# Patient Record
Sex: Female | Born: 1945
Health system: Southern US, Community
[De-identification: ages and names within clinical notes are randomized; demographics above are authoritative.]

## PROBLEM LIST (undated history)

## (undated) DIAGNOSIS — R748 Abnormal levels of other serum enzymes: Secondary | ICD-10-CM

## (undated) DIAGNOSIS — M1990 Unspecified osteoarthritis, unspecified site: Secondary | ICD-10-CM

## (undated) DIAGNOSIS — K219 Gastro-esophageal reflux disease without esophagitis: Secondary | ICD-10-CM

## (undated) DIAGNOSIS — C4490 Unspecified malignant neoplasm of skin, unspecified: Secondary | ICD-10-CM

## (undated) DIAGNOSIS — E785 Hyperlipidemia, unspecified: Secondary | ICD-10-CM

## (undated) DIAGNOSIS — G629 Polyneuropathy, unspecified: Secondary | ICD-10-CM

## (undated) DIAGNOSIS — Z973 Presence of spectacles and contact lenses: Secondary | ICD-10-CM

## (undated) DIAGNOSIS — I739 Peripheral vascular disease, unspecified: Secondary | ICD-10-CM

## (undated) DIAGNOSIS — J3089 Other allergic rhinitis: Secondary | ICD-10-CM

## (undated) DIAGNOSIS — R0683 Snoring: Secondary | ICD-10-CM

## (undated) DIAGNOSIS — G4733 Obstructive sleep apnea (adult) (pediatric): Secondary | ICD-10-CM

## (undated) DIAGNOSIS — C50919 Malignant neoplasm of unspecified site of unspecified female breast: Secondary | ICD-10-CM

## (undated) DIAGNOSIS — I10 Essential (primary) hypertension: Secondary | ICD-10-CM

## (undated) DIAGNOSIS — G43909 Migraine, unspecified, not intractable, without status migrainosus: Secondary | ICD-10-CM

## (undated) DIAGNOSIS — I779 Disorder of arteries and arterioles, unspecified: Secondary | ICD-10-CM

## (undated) DIAGNOSIS — J321 Chronic frontal sinusitis: Secondary | ICD-10-CM

## (undated) DIAGNOSIS — R7303 Prediabetes: Secondary | ICD-10-CM

## (undated) DIAGNOSIS — D649 Anemia, unspecified: Secondary | ICD-10-CM

## (undated) HISTORY — DX: Polyneuropathy, unspecified: G62.9

## (undated) HISTORY — DX: Malignant neoplasm of unspecified site of unspecified female breast: C50.919

## (undated) HISTORY — DX: Disorder of arteries and arterioles, unspecified: I77.9

## (undated) HISTORY — PX: NASAL SINUS SURGERY: SHX719

## (undated) HISTORY — DX: Essential (primary) hypertension: I10

## (undated) HISTORY — PX: WISDOM TOOTH EXTRACTION: SHX21

## (undated) HISTORY — PX: KNEE ARTHROSCOPY: SUR90

## (undated) HISTORY — PX: TOE SURGERY: SHX1073

## (undated) HISTORY — DX: Hyperlipidemia, unspecified: E78.5

## (undated) HISTORY — DX: Migraine, unspecified, not intractable, without status migrainosus: G43.909

## (undated) HISTORY — PX: COLONOSCOPY: SHX174

## (undated) HISTORY — DX: Gastro-esophageal reflux disease without esophagitis: K21.9

## (undated) HISTORY — DX: Obstructive sleep apnea (adult) (pediatric): G47.33

## (undated) HISTORY — DX: Unspecified osteoarthritis, unspecified site: M19.90

## (undated) HISTORY — DX: Peripheral vascular disease, unspecified: I73.9

---

## 1968-11-10 HISTORY — PX: ABDOMINAL HYSTERECTOMY: SHX81

## 1998-05-18 ENCOUNTER — Ambulatory Visit (HOSPITAL_COMMUNITY): Admission: RE | Admit: 1998-05-18 | Discharge: 1998-05-18 | Payer: Self-pay | Admitting: *Deleted

## 1998-08-15 ENCOUNTER — Ambulatory Visit (HOSPITAL_COMMUNITY): Admission: RE | Admit: 1998-08-15 | Discharge: 1998-08-15 | Payer: Self-pay | Admitting: Neurology

## 1998-09-07 ENCOUNTER — Encounter: Payer: Self-pay | Admitting: Gastroenterology

## 1998-09-07 ENCOUNTER — Ambulatory Visit (HOSPITAL_COMMUNITY): Admission: RE | Admit: 1998-09-07 | Discharge: 1998-09-07 | Payer: Self-pay | Admitting: Gastroenterology

## 1999-11-11 HISTORY — PX: JOINT REPLACEMENT: SHX530

## 1999-11-11 HISTORY — PX: BREAST BIOPSY: SHX20

## 2000-03-21 ENCOUNTER — Emergency Department (HOSPITAL_COMMUNITY): Admission: EM | Admit: 2000-03-21 | Discharge: 2000-03-21 | Payer: Self-pay | Admitting: Emergency Medicine

## 2000-08-19 ENCOUNTER — Encounter: Admission: RE | Admit: 2000-08-19 | Discharge: 2000-08-19 | Payer: Self-pay | Admitting: General Surgery

## 2000-08-19 ENCOUNTER — Encounter (INDEPENDENT_AMBULATORY_CARE_PROVIDER_SITE_OTHER): Payer: Self-pay | Admitting: Specialist

## 2000-08-19 ENCOUNTER — Encounter: Payer: Self-pay | Admitting: General Surgery

## 2000-08-19 ENCOUNTER — Other Ambulatory Visit: Admission: RE | Admit: 2000-08-19 | Discharge: 2000-08-19 | Payer: Self-pay | Admitting: General Surgery

## 2000-12-31 ENCOUNTER — Encounter: Payer: Self-pay | Admitting: Internal Medicine

## 2000-12-31 ENCOUNTER — Encounter: Admission: RE | Admit: 2000-12-31 | Discharge: 2000-12-31 | Payer: Self-pay | Admitting: Internal Medicine

## 2002-09-09 ENCOUNTER — Encounter: Payer: Self-pay | Admitting: Orthopaedic Surgery

## 2002-09-13 ENCOUNTER — Inpatient Hospital Stay (HOSPITAL_COMMUNITY): Admission: RE | Admit: 2002-09-13 | Discharge: 2002-09-20 | Payer: Self-pay | Admitting: Orthopaedic Surgery

## 2006-01-05 ENCOUNTER — Ambulatory Visit (HOSPITAL_COMMUNITY): Admission: RE | Admit: 2006-01-05 | Discharge: 2006-01-05 | Payer: Self-pay | Admitting: Internal Medicine

## 2007-01-15 ENCOUNTER — Ambulatory Visit (HOSPITAL_COMMUNITY): Admission: RE | Admit: 2007-01-15 | Discharge: 2007-01-15 | Payer: Self-pay | Admitting: Internal Medicine

## 2008-02-08 ENCOUNTER — Ambulatory Visit (HOSPITAL_COMMUNITY): Admission: RE | Admit: 2008-02-08 | Discharge: 2008-02-08 | Payer: Self-pay | Admitting: Internal Medicine

## 2009-02-13 ENCOUNTER — Ambulatory Visit (HOSPITAL_COMMUNITY): Admission: RE | Admit: 2009-02-13 | Discharge: 2009-02-13 | Payer: Self-pay | Admitting: Internal Medicine

## 2010-02-14 ENCOUNTER — Ambulatory Visit (HOSPITAL_COMMUNITY): Admission: RE | Admit: 2010-02-14 | Discharge: 2010-02-14 | Payer: Self-pay | Admitting: Internal Medicine

## 2010-12-01 ENCOUNTER — Encounter: Payer: Self-pay | Admitting: Internal Medicine

## 2011-01-13 ENCOUNTER — Other Ambulatory Visit (HOSPITAL_COMMUNITY): Payer: Self-pay | Admitting: Internal Medicine

## 2011-01-13 DIAGNOSIS — Z1231 Encounter for screening mammogram for malignant neoplasm of breast: Secondary | ICD-10-CM

## 2011-02-17 ENCOUNTER — Ambulatory Visit (HOSPITAL_COMMUNITY)
Admission: RE | Admit: 2011-02-17 | Discharge: 2011-02-17 | Disposition: A | Discharge status: Home / Self Care | Payer: Medicare Other | Source: Ambulatory Visit | Attending: Internal Medicine | Admitting: Internal Medicine

## 2011-02-17 DIAGNOSIS — Z1231 Encounter for screening mammogram for malignant neoplasm of breast: Secondary | ICD-10-CM | POA: Insufficient documentation

## 2011-03-28 NOTE — Discharge Summary (Signed)
   NAME:  Pamela Hendricks, Pamela Hendricks                        ACCOUNT NO.:  [id number]   MEDICAL RECORD NO.:  [id number]                   PATIENT TYPE:  INP   LOCATION:  5037                                 FACILITY:  [facility]   PHYSICIAN:  Lubertha Basque. Jerl Santos, M.D.             DATE OF BIRTH:  1946-10-01   DATE OF ADMISSION:  09/13/2002  DATE OF DISCHARGE:  09/20/2002                                 DISCHARGE SUMMARY   ADMISSION DIAGNOSES:  1. Left knee end-stage degenerative joint disease.  2. History of reflux.   DISCHARGE DIAGNOSES:  1. Left knee end-stage degenerative joint disease.  2. History of reflux.   OPERATION:  Total knee replacement, left side.   BRIEF HISTORY:  Pamela Hendricks is a 65 year old white female who is a patient  well known to use. We have done an arthroscopy in the past, and she has got  end-stage DJD in her left knee. We tried various oral anti-inflammatory  medications and injections of corticosteroids, and she was given some relief  but is only temporary. She is having pain every step, some night time pain,  and we discussed further different treatment options, that being total knee  replacement.   PERTINENT LABORATORY DATA:  Last INR was 2.5. Hemoglobin on last test was  10.6, hematocrit 31. X-ray findings:  Chest x-ray no active disease.   HOSPITAL COURSE:  The patient was admitted postoperatively and placed on a  variety p.o. and IM analgesics with pain addressed and drain pulled the  second day postoperatively. Wounds were benign. Physical therapy was ordered  to be touch down, and she could be weight bearing as tolerated, and she  progressed well with CPM therapy. Her Coumadin levels/INR was well above the  upper limit of our therapeutic range of 3 which delayed her discharge from  the hospital. Last test once was 2.5, and she was discharged home.   CONDITION ON DISCHARGE:  Improved.   FOLLOW UP:  She will return to our office in about one week's time. She is  given a prescription for Coumadin. This will be regulated by pharmacy.  Prescription for Percocet, #60, for pain. Arrangements for home therapy and  home protimes to be drawn, and we will see her back in our office in about a  week.     Lindwood Qua, P.A.                    Lubertha Basque Jerl Santos, M.D.    MC/MEDQ  D:  09/20/2002  T:  09/20/2002  Job:  [id number]

## 2011-03-28 NOTE — Op Note (Signed)
NAME:  Pamela Hendricks, DRISKILL                        ACCOUNT NO.:  [id number]   MEDICAL RECORD NO.:  [id number]                   PATIENT TYPE:  INP   LOCATION:  NA                                   FACILITY:  [facility]   PHYSICIAN:  Lubertha Basque. Jerl Santos, M.D.             DATE OF BIRTH:  [birth year]   DATE OF PROCEDURE:  09/13/2002  DATE OF DISCHARGE:                                 OPERATIVE REPORT   PREOPERATIVE DIAGNOSIS:  Left knee degenerative arthritis.   POSTOPERATIVE DIAGNOSIS:  Left knee degenerative arthritis.   PROCEDURE:  Left total knee replacement.   ANESTHESIA:  General and femoral nerve block.   ATTENDING SURGEON:  Lubertha Basque. Jerl Santos, M.D.   ASSISTANT:  Bradley Ferris, M.D. and Lindwood Qua, P.A.   INDICATIONS FOR PROCEDURE:  The patient is a 65 year-old woman with a long  history of left knee pain. This has persisted despite oral anti-  inflammatories and injections as well as an arthroscopy at which point some  end stage degenerative changes were found.  At this point she is offered a  knee replacement operation.  The procedure was discussed with the patient  and informed operative consent was obtained after a discussion of the  possible implications of, reaction to anesthesia, infection, DVT, PE and  death.   DESCRIPTION OF PROCEDURE:  The patient was taken to the operating suite  where a general anesthetic was acquired without difficulty.  She was  positioned supine and prepped and draped in normal sterile fashion.  After  the administration of IV antibiotics the leg was elevated, exsanguinated and  tourniquet inflated about the thigh.  An anterior longitudinal incision was  made with flexion down to the extensor mechanism.  Also, anti-infective  measures were used including the aforementioned IV antibiotic, Betadine  impregnated drape and closed third exhaust systems for each member of the  surgical team. The extensor mechanism was incised in a medial  parapatellar  fashion followed by a flip of the knee cap and flex of the knee.  She had  some end stage degenerative changes mostly in the lateral compartment. The  residual meniscal tissues, the ACL and the PCL were removed.  She had good  bone quality.  Next, the extramedullary guide was placed on the tibia to  create a tibial cut parallel with the floor.  An intramedullary guide was  then placed in the femur in order to create anterior and posterior cuts  making an extension gap of 10 mm.  A separate intramedullary guide was then  introduced in the femur to place a cutting block in order to make an equal  extension gap of 10 mm after a cut on the distal femur was made.  She  required a small amount of soft tissue balancing with lateral release off  the femur.  No lateral release for the patella was required and the patella  always tracked  very well.  The femur sized to a size standard component and  the appropriate chamfer cutting guide was placed and utilized.  The tibia  sized to a size 3 component and the appropriate cutting guide was placed and  used.  A trial reduction was done with those components and a 10 mm spacer.  She came from full extension to flexion at 120.  The knee cap tracked well.  The patella was cut down by about 10 mm in thickness followed by the use of  a 32 guide for placement of three drill holes for the all poly patella.  The  trial components were removed followed by pulsatile lavage of all cut bony  surfaces.  Cement was mixed including an antibiotic and was pressurized onto  all three cut bony surfaces. The [facility] components were then placed.  These components were a size 3 tibia, standard femur, size 32 all poly  patella and a 10 mm deep dish spacer.  Excess cement was trimmed and the  pressure was held in the components until the cement had hardened.  The  tourniquet was deflated.  A small amount of bleeding was easily controlled  with Bovie cautery. The  knee was thoroughly irrigated followed by placement  of a drain exiting superolaterally.  The extensor mechanism was  reapproximated with #1 Vicryl in an interrupted fashion followed by  subcutaneous reapproximation with 2-0 Vicryl and skin closure with staples.  The knee easily flexed to 110 after closure. Adaptic was placed on the wound  followed by dry gauze and a loose ace wrap.  Estimated blood loss and total  fluids can be obtained from the anesthesia records as can an accurate  tourniquet time.   DISPOSITION:  The patient was extubated in the operating room and taken to  the recovery room in stable condition.  The plans are for her to be admitted  to the orthopedic surgery service for appropriate postoperative care to  include perioperative antibiotics and Coumadin for DVT prophylaxis.                                               Lubertha Basque Jerl Santos, M.D.    PGD/MEDQ  D:  09/13/2002  T:  09/13/2002  Job:  [id number]

## 2011-11-11 HISTORY — PX: BREAST EXCISIONAL BIOPSY: SUR124

## 2012-01-16 ENCOUNTER — Other Ambulatory Visit (HOSPITAL_COMMUNITY): Payer: Self-pay | Admitting: Internal Medicine

## 2012-01-16 DIAGNOSIS — Z1231 Encounter for screening mammogram for malignant neoplasm of breast: Secondary | ICD-10-CM

## 2012-04-20 ENCOUNTER — Ambulatory Visit (HOSPITAL_COMMUNITY)
Admission: RE | Admit: 2012-04-20 | Discharge: 2012-04-20 | Disposition: A | Discharge status: Home / Self Care | Payer: Medicare Other | Source: Ambulatory Visit | Attending: Internal Medicine | Admitting: Internal Medicine

## 2012-04-20 DIAGNOSIS — Z1231 Encounter for screening mammogram for malignant neoplasm of breast: Secondary | ICD-10-CM | POA: Insufficient documentation

## 2012-05-03 ENCOUNTER — Other Ambulatory Visit: Payer: Self-pay | Admitting: Internal Medicine

## 2012-05-03 DIAGNOSIS — R928 Other abnormal and inconclusive findings on diagnostic imaging of breast: Secondary | ICD-10-CM

## 2012-05-10 ENCOUNTER — Other Ambulatory Visit: Payer: Self-pay | Admitting: Internal Medicine

## 2012-05-10 ENCOUNTER — Ambulatory Visit
Admission: RE | Admit: 2012-05-10 | Discharge: 2012-05-10 | Disposition: A | Discharge status: Home / Self Care | Payer: Medicare Other | Source: Ambulatory Visit | Attending: Internal Medicine | Admitting: Internal Medicine

## 2012-05-10 DIAGNOSIS — R928 Other abnormal and inconclusive findings on diagnostic imaging of breast: Secondary | ICD-10-CM

## 2012-06-10 ENCOUNTER — Encounter (INDEPENDENT_AMBULATORY_CARE_PROVIDER_SITE_OTHER): Payer: Self-pay | Admitting: General Surgery

## 2012-06-10 ENCOUNTER — Ambulatory Visit (INDEPENDENT_AMBULATORY_CARE_PROVIDER_SITE_OTHER): Payer: Medicare Other | Admitting: General Surgery

## 2012-06-10 ENCOUNTER — Other Ambulatory Visit (INDEPENDENT_AMBULATORY_CARE_PROVIDER_SITE_OTHER): Payer: Self-pay | Admitting: General Surgery

## 2012-06-10 VITALS — BP 134/72 | HR 66 | Temp 97.8°F | Resp 16 | Ht 65.0 in | Wt 201.8 lb

## 2012-06-10 DIAGNOSIS — R928 Other abnormal and inconclusive findings on diagnostic imaging of breast: Secondary | ICD-10-CM

## 2012-06-10 NOTE — Patient Instructions (Signed)
Your mammogram reveals an  abnormal, focal density in the upper outer quadrant of the right breast. Needle biopsy of this area reveals a complex sclerosing lesion. There is a low but definite possibility there could be early breast cancer in the right breast.  You will be scheduled for surgery on your right breast to excise this area in the near future.    Lumpectomy, Breast Conserving Surgery A lumpectomy is breast surgery that removes only part of the breast. Another name used may be partial mastectomy. The amount removed varies. Make sure you understand how much of your breast will be removed. Reasons for a lumpectomy:  Any solid breast mass.   Grouped significant nodularity that may be confused with a solitary breast mass.  Lumpectomy is the most common form of breast cancer surgery today. The surgeon removes the portion of your breast which contains the tumor (cancer). This is the lump. Some normal tissue around the lump is also removed to be sure that all the tumor has been removed.  If cancer cells are found in the margins where the breast tissue was removed, your surgeon will do more surgery to remove the remaining cancer tissue. This is called re-excision surgery. Radiation and/or chemotherapy treatments are often given following a lumpectomy to kill any cancer cells that could possibly remain.  REASONS YOU MAY NOT BE ABLE TO HAVE BREAST CONSERVING SURGERY:  The tumor is located in more than one place.   Your breast is small and the tumor is large so the breast would be disfigured.   The entire tumor removal is not successful with a lumpectomy.   You cannot commit to a full course of chemotherapy, radiation therapy or are pregnant and cannot have radiation.   You have previously had radiation to the breast to treat cancer.  HOW A LUMPECTOMY IS PERFORMED If overnight nursing is not required following a biopsy, a lumpectomy can be performed as a same-day surgery. This can be done in  a hospital, clinic, or surgical center. The anesthesia used will depend on your surgeon. They will discuss this with you. A general anesthetic keeps you sleeping through the procedure. LET YOUR CAREGIVERS KNOW ABOUT THE FOLLOWING:  Allergies   Medications taken including herbs, eye drops, over the counter medications, and creams.   Use of steroids (by mouth or creams)   Previous problems with anesthetics or Novocaine.   Possibility of pregnancy, if this applies   History of blood clots (thrombophlebitis)   History of bleeding or blood problems.   Previous surgery   Other health problems  BEFORE THE PROCEDURE You should be present one hour prior to your procedure unless directed otherwise.  AFTER THE PROCEDURE  After surgery, you will be taken to the recovery area where a nurse will watch and check your progress. Once you're awake, stable, and taking fluids well, barring other problems you will be allowed to go home.   Ice packs applied to your operative site may help with discomfort and keep the swelling down.   A small rubber drain may be placed in the breast for a couple of days to prevent a hematoma from developing in the breast.   A pressure dressing may be applied for 24 to 48 hours to prevent bleeding.   Keep the wound dry.   You may resume a normal diet and activities as directed. Avoid strenuous activities affecting the arm on the side of the biopsy site such as tennis, swimming, heavy lifting (more than  10 pounds) or pulling.   Bruising in the breast is normal following this procedure.   Wearing a bra - even to bed - may be more comfortable and also help keep the dressing on.   Change dressings as directed.   Only take over-the-counter or prescription medicines for pain, discomfort, or fever as directed by your caregiver.  Call for your results as instructed by your surgeon. Remember it is your responsibility to get the results of your lumpectomy if your surgeon  asked you to follow-up. Do not assume everything is fine if you have not heard from your caregiver. SEEK MEDICAL CARE IF:   There is increased bleeding (more than a small spot) from the wound.   You notice redness, swelling, or increasing pain in the wound.   Pus is coming from wound.   An unexplained oral temperature above 102 F (38.9 C) develops.   You notice a foul smell coming from the wound or dressing.  SEEK IMMEDIATE MEDICAL CARE IF:   You develop a rash.   You have difficulty breathing.   You have any allergic problems.  Document Released: 12/08/2006 Document Revised: 10/16/2011 Document Reviewed: 03/11/2007 [facility] Patient Information 2012 [place], [place].

## 2012-06-10 NOTE — Progress Notes (Signed)
Patient ID: Pamela Hendricks, female   DOB: 07-Aug-1946, 66 y.o.   MRN: [id number]  No chief complaint on file.   [name] is a 66 y.o. female.  She is referred by Dr. Lanora Manis needle for evaluation of an abnormal mammogram and biopsy of the right breast, revealing complex sclerosing lesion in the upper outer quadrant of the right breast. Her primary care physician is Dr. Theressa Millard.  The patient has not had any prior breast problems. She does not perceive a mass. Recent screening mammograms showed an area of architectural distortion at the 10:30 position of the right breast, 5 cm from the nipple. On July 1 she underwent image guided biopsy which shows complex sclerosing lesion and microcalcifications. She was referred for surgical consultation and consideration of diagnostic excisional biopsy.  Family history is negative for breast cancer or ovarian cancer.  The patient's past history is significant for hypertension, obesity, depressive disorder, hyperlipidemia, left TKR, vaginal hysterectomy and removal of one ovary. HPI  Past Medical History  Diagnosis Date  . Arthritis   . GERD (gastroesophageal reflux disease)   . Hyperlipidemia   . Hypertension   . Neuromuscular disorder     Past Surgical History  Procedure Date  . Joint replacement 2001    Left Knee Replacement  . Abdominal hysterectomy 1970    Total HYST    Family History  Problem Relation Age of Onset  . Cancer Mother   . Hypertension Mother   . Hypertension Father   . Heart disease Father   . Cancer Sister     Brain Ca  . Cancer Brother     Prostate Ca    Social History History  Substance Use Topics  . Smoking status: Never Smoker   . Smokeless tobacco: Not on file  . Alcohol Use: No    No Known Allergies    Review of Systems Review of Systems  Constitutional: Negative for fever, chills and unexpected weight change.  HENT: Negative for hearing loss, congestion, sore throat, trouble  swallowing and voice change.   Eyes: Negative for visual disturbance.  Respiratory: Negative for cough and wheezing.   Cardiovascular: Negative for chest pain, palpitations and leg swelling.  Gastrointestinal: Negative for nausea, vomiting, abdominal pain, diarrhea, constipation, blood in stool, abdominal distention and anal bleeding.  Genitourinary: Negative for hematuria, vaginal bleeding and difficulty urinating.  Musculoskeletal: Negative for arthralgias.  Skin: Negative for rash and wound.  Neurological: Negative for seizures, syncope and headaches.  Hematological: Negative for adenopathy. Does not bruise/bleed easily.  Psychiatric/Behavioral: Negative for confusion.    Blood pressure 134/72, pulse 66, temperature 97.8 F (36.6 C), temperature source Temporal, resp. rate 16, height 5\' 5"  (1.651 m), weight 201 lb 12.8 oz (91.536 kg).  Physical Exam Physical Exam  Constitutional: She is oriented to person, place, and time. She appears well-developed and well-nourished. No distress.  HENT:  Head: Normocephalic and atraumatic.  Nose: Nose normal.  Mouth/Throat: No oropharyngeal exudate.  Eyes: Conjunctivae and EOM are normal. Pupils are equal, round, and reactive to light. Left eye exhibits no discharge. No scleral icterus.  Neck: Neck supple. No JVD present. No tracheal deviation present. No thyromegaly present.  Cardiovascular: Normal rate, regular rhythm, normal heart sounds and intact distal pulses.   No murmur heard. Pulmonary/Chest: Effort normal and breath sounds normal. No respiratory distress. She has no wheezes. She has no rales. She exhibits no tenderness.       Breasts are moderately large. Healed biopsy  site at 9 oclock position right breast. Basically no mass, skin changes, or axillary adenopathy on either side.  Abdominal: Soft. Bowel sounds are normal. She exhibits no distension and no mass. There is no tenderness. There is no rebound and no guarding.  Musculoskeletal:  She exhibits no edema and no tenderness.  Lymphadenopathy:    She has no cervical adenopathy.  Neurological: She is alert and oriented to person, place, and time. She exhibits normal muscle tone. Coordination normal.  Skin: Skin is warm. No rash noted. She is not diaphoretic. No erythema. No pallor.  Psychiatric: She has a normal mood and affect. Her behavior is normal. Judgment and thought content normal.    Data Reviewed I reviewed her mammograms, ultrasound, pathology report and Dr. Earl Gala should recent history and physical.  Assessment    Complex sclerosing lesion, right breast, upper outer quadrant, focal finding. Excisional biopsy is warranted to exclude occult early breast cancer  Obesity  Hypertension  Major depressive disorder    Plan    The patient was offered excisional biopsy, and she wants to have that done.  She'll be scheduled for right breast biopsy and excision with margins with needle localization in the near future.  I discussed the indications, details, techniques, and numerous risks with the patient in detail. She understands these issues. Her questions were answered. She agrees with this plan.       Angelia Mould. Derrell Lolling, M.D., [facility] General and Minimally invasive Surgery Breast and Colorectal Surgery Office:   [phone] Pager:   [phone]   06/10/2012, 10:33 AM

## 2012-06-14 ENCOUNTER — Telehealth (INDEPENDENT_AMBULATORY_CARE_PROVIDER_SITE_OTHER): Payer: Self-pay

## 2012-06-14 NOTE — Telephone Encounter (Signed)
The patient called to see what medications to stop before surgery.  She states she is on a daily 81 mg Aspirin and an herbal medication.  I advised her to stop both of those a week before her surgery.  I told her she can stay on the Calcium and Multivitamin.

## 2012-06-14 NOTE — Telephone Encounter (Signed)
Patient called with questions regarding about medications she need's to stop prior to surgery.  Patient scheduled for Right Breast NL on 06/21/12.

## 2012-06-15 ENCOUNTER — Encounter (HOSPITAL_BASED_OUTPATIENT_CLINIC_OR_DEPARTMENT_OTHER): Payer: Self-pay | Admitting: *Deleted

## 2012-06-15 NOTE — Progress Notes (Signed)
To come in for bmet and ekg 

## 2012-06-18 ENCOUNTER — Other Ambulatory Visit: Payer: Self-pay

## 2012-06-18 ENCOUNTER — Encounter (HOSPITAL_BASED_OUTPATIENT_CLINIC_OR_DEPARTMENT_OTHER)
Admission: RE | Admit: 2012-06-18 | Discharge: 2012-06-18 | Disposition: A | Discharge status: Home / Self Care | Payer: Medicare Other | Source: Ambulatory Visit | Attending: General Surgery | Admitting: General Surgery

## 2012-06-18 LAB — BASIC METABOLIC PANEL
Calcium: 9.5 mg/dL (ref 8.4–10.5)
GFR calc Af Amer: >90 mL/min (ref >90)
GFR calc non Af Amer: >90 mL/min (ref >90)
Potassium: 4.9 mEq/L (ref 3.5–5.1)
Sodium: 138 mEq/L (ref 135–145)

## 2012-06-18 NOTE — H&P (Signed)
Pamela Hendricks    MRN: [id number]   Description: 66 year old female  Provider: [name], MD  Department: [facility]      Diagnoses     Abnormal mammogram   - Primary    793.80      Vitals    BP Pulse Temp Resp Ht Wt    134/72 66 97.8 F (36.6 C) (Temporal) 16 5\' 5"  (1.651 m) 201 lb 12.8 oz (91.536 kg)    BMI - 33.58 kg/m2                Hiatory and Physical     [name], MD   Patient ID: Pamela Hendricks, female   DOB: 1946-05-19, 66 y.o.   MRN: [id number]           [name] is a 66 y.o. female.  She is referred by Dr. Lanora Manis needle for evaluation of an abnormal mammogram and biopsy of the right breast, revealing complex sclerosing lesion in the upper outer quadrant of the right breast. Her primary care physician is Dr. Theressa Millard.   The patient has not had any prior breast problems. She does not perceive a mass. Recent screening mammograms showed an area of architectural distortion at the 10:30 position of the right breast, 5 cm from the nipple. On July 1 she underwent image guided biopsy which shows complex sclerosing lesion and microcalcifications. She was referred for surgical consultation and consideration of diagnostic excisional biopsy.   Family history is negative for breast cancer or ovarian cancer.   The patient's past history is significant for hypertension, obesity, depressive disorder, hyperlipidemia, left TKR, vaginal hysterectomy and removal of one ovary.     Past Medical History   Diagnosis  Date   .  Arthritis     .  GERD (gastroesophageal reflux disease)     .  Hyperlipidemia     .  Hypertension     .  Neuromuscular disorder         Past Surgical History   Procedure  Date   .  Joint replacement  2001       Left Knee Replacement   .  Abdominal hysterectomy  1970       Total HYST       Family History   Problem  Relation  Age of Onset   .  Cancer  Mother     .  Hypertension  Mother     .   Hypertension  Father     .  Heart disease  Father     .  Cancer  Sister         Brain Ca   .  Cancer  Brother         Prostate Ca      Social History History   Substance Use Topics   .  Smoking status:  Never Smoker    .  Smokeless tobacco:  Not on file   .  Alcohol Use:  No      No Known Allergies       ROS  Constitutional: Negative for fever, chills and unexpected weight change.  HENT: Negative for hearing loss, congestion, sore throat, trouble swallowing and voice change.   Eyes: Negative for visual disturbance.  Respiratory: Negative for cough and wheezing.   Cardiovascular: Negative for chest pain, palpitations and leg swelling.  Gastrointestinal: Negative for nausea, vomiting, abdominal pain, diarrhea, constipation, blood in stool, abdominal distention  and anal bleeding.  Genitourinary: Negative for hematuria, vaginal bleeding and difficulty urinating.  Musculoskeletal: Negative for arthralgias.  Skin: Negative for rash and wound.  Neurological: Negative for seizures, syncope and headaches.  Hematological: Negative for adenopathy. Does not bruise/bleed easily.  Psychiatric/Behavioral: Negative for confusion.    Constitutional: She is oriented to person, place, and time. She appears well-developed and well-nourished. No distress.  HENT:   Head: Normocephalic and atraumatic.   Nose: Nose normal.   Mouth/Throat: No oropharyngeal exudate.  Eyes: Conjunctivae and EOM are normal. Pupils are equal, round, and reactive to light. Left eye exhibits no discharge. No scleral icterus.  Neck: Neck supple. No JVD present. No tracheal deviation present. No thyromegaly present.  Cardiovascular: Normal rate, regular rhythm, normal heart sounds and intact distal pulses.    No murmur heard. Pulmonary/Chest: Effort normal and breath sounds normal. No respiratory distress. She has no wheezes. She has no rales. She exhibits no tenderness.       Breasts are moderately large. Healed  biopsy site at 9 oclock position right breast. Basically no mass, skin changes, or axillary adenopathy on either side.  Abdominal: Soft. Bowel sounds are normal. She exhibits no distension and no mass. There is no tenderness. There is no rebound and no guarding.  Musculoskeletal: She exhibits no edema and no tenderness.  Lymphadenopathy:    She has no cervical adenopathy.  Neurological: She is alert and oriented to person, place, and time. She exhibits normal muscle tone. Coordination normal.  Skin: Skin is warm. No rash noted. She is not diaphoretic. No erythema. No pallor.  Psychiatric: She has a normal mood and affect. Her behavior is normal. Judgment and thought content normal.    Data Reviewed I reviewed her mammograms, ultrasound, pathology report and Dr. Earl Gala should recent history and physical.   Assessment Complex sclerosing lesion, right breast, upper outer quadrant, focal finding. Excisional biopsy is warranted to exclude occult early breast cancer   Obesity   Hypertension   Major depressive disorder   Plan The patient was offered excisional biopsy, and she wants to have that done.   She'll be scheduled for right breast biopsy and excision with margins with needle localization in the near future.   I discussed the indications, details, techniques, and numerous risks with the patient in detail. She understands these issues. Her questions were answered. She agrees with this plan.       Angelia Pamela Hendricks. Derrell Lolling, M.D., [facility] General and Minimally invasive Surgery Breast and Colorectal Surgery Office:   [phone] Pager:   [phone]

## 2012-06-18 NOTE — Progress Notes (Signed)
ekg cleared by dr frederick 

## 2012-06-21 ENCOUNTER — Encounter (HOSPITAL_BASED_OUTPATIENT_CLINIC_OR_DEPARTMENT_OTHER): Payer: Self-pay | Admitting: Certified Registered Nurse Anesthetist

## 2012-06-21 ENCOUNTER — Ambulatory Visit
Admit: 2012-06-21 | Discharge: 2012-06-21 | Disposition: A | Discharge status: Home / Self Care | Payer: Medicare Other | Attending: General Surgery | Admitting: General Surgery

## 2012-06-21 ENCOUNTER — Ambulatory Visit (HOSPITAL_BASED_OUTPATIENT_CLINIC_OR_DEPARTMENT_OTHER)
Admission: RE | Admit: 2012-06-21 | Discharge: 2012-06-21 | Disposition: A | Discharge status: Home / Self Care | Payer: Medicare Other | Source: Ambulatory Visit | Attending: General Surgery | Admitting: General Surgery

## 2012-06-21 ENCOUNTER — Encounter (HOSPITAL_BASED_OUTPATIENT_CLINIC_OR_DEPARTMENT_OTHER)
Admission: RE | Disposition: A | Discharge status: Home / Self Care | Payer: Self-pay | Source: Ambulatory Visit | Attending: General Surgery

## 2012-06-21 ENCOUNTER — Ambulatory Visit (HOSPITAL_BASED_OUTPATIENT_CLINIC_OR_DEPARTMENT_OTHER): Payer: Medicare Other | Admitting: Certified Registered Nurse Anesthetist

## 2012-06-21 ENCOUNTER — Ambulatory Visit
Admission: RE | Admit: 2012-06-21 | Discharge: 2012-06-21 | Disposition: A | Discharge status: Home / Self Care | Payer: Medicare Other | Source: Ambulatory Visit | Attending: General Surgery | Admitting: General Surgery

## 2012-06-21 ENCOUNTER — Encounter (HOSPITAL_BASED_OUTPATIENT_CLINIC_OR_DEPARTMENT_OTHER): Payer: Self-pay | Admitting: *Deleted

## 2012-06-21 DIAGNOSIS — D249 Benign neoplasm of unspecified breast: Secondary | ICD-10-CM

## 2012-06-21 DIAGNOSIS — R928 Other abnormal and inconclusive findings on diagnostic imaging of breast: Secondary | ICD-10-CM | POA: Diagnosis present

## 2012-06-21 DIAGNOSIS — I10 Essential (primary) hypertension: Secondary | ICD-10-CM | POA: Insufficient documentation

## 2012-06-21 DIAGNOSIS — Z01812 Encounter for preprocedural laboratory examination: Secondary | ICD-10-CM | POA: Insufficient documentation

## 2012-06-21 DIAGNOSIS — Z01810 Encounter for preprocedural cardiovascular examination: Secondary | ICD-10-CM | POA: Insufficient documentation

## 2012-06-21 DIAGNOSIS — K219 Gastro-esophageal reflux disease without esophagitis: Secondary | ICD-10-CM | POA: Insufficient documentation

## 2012-06-21 DIAGNOSIS — N6029 Fibroadenosis of unspecified breast: Secondary | ICD-10-CM | POA: Insufficient documentation

## 2012-06-21 HISTORY — DX: Presence of spectacles and contact lenses: Z97.3

## 2012-06-21 HISTORY — DX: Snoring: R06.83

## 2012-06-21 SURGERY — BREAST LUMPECTOMY WITH NEEDLE LOCALIZATION
Anesthesia: General | Site: Breast | Laterality: Right | Wound class: Clean

## 2012-06-21 MED ORDER — DEXAMETHASONE SODIUM PHOSPHATE 4 MG/ML IJ SOLN
INTRAMUSCULAR | Status: DC | PRN
  Administered 2012-06-21: 10 mg via INTRAVENOUS

## 2012-06-21 MED ORDER — BUPIVACAINE-EPINEPHRINE 0.5% -1:200000 IJ SOLN
INTRAMUSCULAR | Status: DC | PRN
  Administered 2012-06-21: 17 mL

## 2012-06-21 MED ORDER — OXYCODONE HCL 5 MG/5ML PO SOLN: 5.0000 mg | Freq: Once | ORAL | Status: DC | PRN

## 2012-06-21 MED ORDER — SODIUM CHLORIDE 0.9 % IJ SOLN: 3.0000 mL | Freq: Two times a day (BID) | INTRAMUSCULAR | Status: DC

## 2012-06-21 MED ORDER — MORPHINE SULFATE 2 MG/ML IJ SOLN: 2.0000 mg | INTRAMUSCULAR | Status: DC | PRN

## 2012-06-21 MED ORDER — MIDAZOLAM HCL 5 MG/5ML IJ SOLN
INTRAMUSCULAR | Status: DC | PRN
  Administered 2012-06-21: 1 mg via INTRAVENOUS

## 2012-06-21 MED ORDER — LIDOCAINE HCL (CARDIAC) 20 MG/ML IV SOLN
INTRAVENOUS | Status: DC | PRN
  Administered 2012-06-21: 50 mg via INTRAVENOUS

## 2012-06-21 MED ORDER — SODIUM CHLORIDE 0.9 % IJ SOLN: 3.0000 mL | INTRAMUSCULAR | Status: DC | PRN

## 2012-06-21 MED ORDER — ONDANSETRON HCL 4 MG/2ML IJ SOLN
INTRAMUSCULAR | Status: DC | PRN
  Administered 2012-06-21: 4 mg via INTRAVENOUS

## 2012-06-21 MED ORDER — ACETAMINOPHEN 650 MG RE SUPP: 650.0000 mg | RECTAL | Status: DC | PRN

## 2012-06-21 MED ORDER — SCOPOLAMINE 1 MG/3DAYS TD PT72
1.0000 | MEDICATED_PATCH | Freq: Once | TRANSDERMAL | Status: DC
  Administered 2012-06-21: 1.5 mg via TRANSDERMAL

## 2012-06-21 MED ORDER — OXYCODONE HCL 5 MG PO TABS: 5.0000 mg | ORAL_TABLET | ORAL | Status: DC | PRN

## 2012-06-21 MED ORDER — HYDROMORPHONE HCL PF 1 MG/ML IJ SOLN: 0.2500 mg | INTRAMUSCULAR | Status: DC | PRN

## 2012-06-21 MED ORDER — ONDANSETRON HCL 4 MG/2ML IJ SOLN: 4.0000 mg | Freq: Four times a day (QID) | INTRAMUSCULAR | Status: DC | PRN

## 2012-06-21 MED ORDER — LACTATED RINGERS IV SOLN
INTRAVENOUS | Status: DC
  Administered 2012-06-21 (×2): via INTRAVENOUS

## 2012-06-21 MED ORDER — METOCLOPRAMIDE HCL 5 MG/ML IJ SOLN: 10.0000 mg | Freq: Once | INTRAMUSCULAR | Status: DC | PRN

## 2012-06-21 MED ORDER — CHLORHEXIDINE GLUCONATE 4 % EX LIQD: 1.0000 "application " | Freq: Once | CUTANEOUS | Status: DC

## 2012-06-21 MED ORDER — ACETAMINOPHEN 10 MG/ML IV SOLN
1000.0000 mg | Freq: Once | INTRAVENOUS | Status: AC
  Administered 2012-06-21: 1000 mg via INTRAVENOUS

## 2012-06-21 MED ORDER — OXYCODONE HCL 5 MG PO TABS: 5.0000 mg | ORAL_TABLET | Freq: Once | ORAL | Status: DC | PRN

## 2012-06-21 MED ORDER — FENTANYL CITRATE 0.05 MG/ML IJ SOLN
INTRAMUSCULAR | Status: DC | PRN
  Administered 2012-06-21: 50 ug via INTRAVENOUS
  Administered 2012-06-21: 25 ug via INTRAVENOUS

## 2012-06-21 MED ORDER — SODIUM CHLORIDE 0.9 % IV SOLN: 250.0000 mL | INTRAVENOUS | Status: DC | PRN

## 2012-06-21 MED ORDER — PROPOFOL 10 MG/ML IV EMUL
INTRAVENOUS | Status: DC | PRN
  Administered 2012-06-21: 200 mg via INTRAVENOUS

## 2012-06-21 MED ORDER — CEFAZOLIN SODIUM-DEXTROSE 2-3 GM-% IV SOLR
2.0000 g | INTRAVENOUS | Status: AC
  Administered 2012-06-21: 2 g via INTRAVENOUS

## 2012-06-21 MED ORDER — SODIUM CHLORIDE 0.9 % IV SOLN: INTRAVENOUS | Status: DC

## 2012-06-21 MED ORDER — HEPARIN SODIUM (PORCINE) 5000 UNIT/ML IJ SOLN
5000.0000 [IU] | Freq: Once | INTRAMUSCULAR | Status: AC
  Administered 2012-06-21: 5000 [IU] via SUBCUTANEOUS

## 2012-06-21 MED ORDER — ACETAMINOPHEN 325 MG PO TABS: 650.0000 mg | ORAL_TABLET | ORAL | Status: DC | PRN

## 2012-06-21 MED ORDER — EPHEDRINE SULFATE 50 MG/ML IJ SOLN
INTRAMUSCULAR | Status: DC | PRN
  Administered 2012-06-21 (×3): 10 mg via INTRAVENOUS

## 2012-06-21 MED ORDER — HYDROCODONE-ACETAMINOPHEN 10-500 MG PO TABS: 1.0000 | ORAL_TABLET | Freq: Four times a day (QID) | ORAL | Status: AC | PRN

## 2012-06-21 SURGICAL SUPPLY — 66 items
ADH SKN CLS APL DERMABOND .7 (GAUZE/BANDAGES/DRESSINGS) ×1
APL SKNCLS STERI-STRIP NONHPOA (GAUZE/BANDAGES/DRESSINGS)
APPLIER CLIP 11 MED OPEN (CLIP)
APR CLP MED 11 20 MLT OPN (CLIP)
BANDAGE ELASTIC 6 VELCRO ST LF (GAUZE/BANDAGES/DRESSINGS) IMPLANT
BENZOIN TINCTURE PRP APPL 2/3 (GAUZE/BANDAGES/DRESSINGS) IMPLANT
BLADE HEX COATED 2.75 (ELECTRODE) ×3 IMPLANT
BLADE SURG 10 STRL SS (BLADE) IMPLANT
BLADE SURG 15 STRL LF DISP TIS (BLADE) ×2 IMPLANT
BLADE SURG 15 STRL SS (BLADE) ×4
CANISTER SUCTION 1200CC (MISCELLANEOUS) ×2 IMPLANT
CHLORAPREP W/TINT 26ML (MISCELLANEOUS) ×2 IMPLANT
CLIP APPLIE 11 MED OPEN (CLIP) ×1 IMPLANT
CLOTH BEACON ORANGE TIMEOUT ST (SAFETY) ×2 IMPLANT
COVER MAYO STAND STRL (DRAPES) ×2 IMPLANT
COVER PROBE W GEL 5X96 (DRAPES) ×1 IMPLANT
COVER TABLE BACK 60X90 (DRAPES) ×2 IMPLANT
DECANTER SPIKE VIAL GLASS SM (MISCELLANEOUS) IMPLANT
DERMABOND ADVANCED (GAUZE/BANDAGES/DRESSINGS) ×1
DERMABOND ADVANCED .7 DNX12 (GAUZE/BANDAGES/DRESSINGS) IMPLANT
DEVICE DUBIN W/COMP PLATE 8390 (MISCELLANEOUS) ×2 IMPLANT
DRAIN CHANNEL 19F RND (DRAIN) IMPLANT
DRAIN HEMOVAC 1/8 X 5 (WOUND CARE) IMPLANT
DRAPE LAPAROSCOPIC ABDOMINAL (DRAPES) ×2 IMPLANT
DRAPE UTILITY XL STRL (DRAPES) ×2 IMPLANT
DRSG PAD ABDOMINAL 8X10 ST (GAUZE/BANDAGES/DRESSINGS) IMPLANT
ELECT REM PT RETURN 9FT ADLT (ELECTROSURGICAL) ×2
ELECTRODE REM PT RTRN 9FT ADLT (ELECTROSURGICAL) ×1 IMPLANT
EVACUATOR SILICONE 100CC (DRAIN) IMPLANT
GAUZE SPONGE 4X4 12PLY STRL LF (GAUZE/BANDAGES/DRESSINGS) IMPLANT
GAUZE SPONGE 4X4 16PLY XRAY LF (GAUZE/BANDAGES/DRESSINGS) IMPLANT
GLOVE BIO SURGEON STRL SZ 6.5 (GLOVE) ×1 IMPLANT
GLOVE BIOGEL PI IND STRL 7.0 (GLOVE) IMPLANT
GLOVE BIOGEL PI INDICATOR 7.0 (GLOVE) ×1
GLOVE EUDERMIC 7 POWDERFREE (GLOVE) ×2 IMPLANT
GOWN PREVENTION PLUS XLARGE (GOWN DISPOSABLE) ×2 IMPLANT
GOWN PREVENTION PLUS XXLARGE (GOWN DISPOSABLE) ×2 IMPLANT
KIT MARKER MARGIN INK (KITS) ×2 IMPLANT
NDL HYPO 25X1 1.5 SAFETY (NEEDLE) ×2 IMPLANT
NDL SAFETY ECLIPSE 18X1.5 (NEEDLE) IMPLANT
NEEDLE HYPO 18GX1.5 SHARP (NEEDLE)
NEEDLE HYPO 25X1 1.5 SAFETY (NEEDLE) ×2 IMPLANT
NS IRRIG 1000ML POUR BTL (IV SOLUTION) ×2 IMPLANT
PACK BASIN DAY SURGERY FS (CUSTOM PROCEDURE TRAY) ×2 IMPLANT
PAD ALCOHOL SWAB (MISCELLANEOUS) ×1 IMPLANT
PENCIL BUTTON HOLSTER BLD 10FT (ELECTRODE) ×3 IMPLANT
PIN SAFETY STERILE (MISCELLANEOUS) IMPLANT
SLEEVE SCD COMPRESS KNEE MED (MISCELLANEOUS) ×1 IMPLANT
SPONGE GAUZE 4X4 12PLY (GAUZE/BANDAGES/DRESSINGS) ×2 IMPLANT
SPONGE LAP 18X18 X RAY DECT (DISPOSABLE) IMPLANT
SPONGE LAP 4X18 X RAY DECT (DISPOSABLE) ×2 IMPLANT
STRIP CLOSURE SKIN 1/2X4 (GAUZE/BANDAGES/DRESSINGS) IMPLANT
SUT ETHILON 3 0 FSL (SUTURE) IMPLANT
SUT MNCRL AB 4-0 PS2 18 (SUTURE) ×3 IMPLANT
SUT SILK 2 0 SH (SUTURE) ×2 IMPLANT
SUT VIC AB 2-0 CT1 27 (SUTURE)
SUT VIC AB 2-0 CT1 TAPERPNT 27 (SUTURE) IMPLANT
SUT VIC AB 3-0 SH 27 (SUTURE)
SUT VIC AB 3-0 SH 27X BRD (SUTURE) IMPLANT
SUT VICRYL 3-0 CR8 SH (SUTURE) ×2 IMPLANT
SYR CONTROL 10ML LL (SYRINGE) ×3 IMPLANT
TOWEL OR 17X24 6PK STRL BLUE (TOWEL DISPOSABLE) ×3 IMPLANT
TOWEL OR NON WOVEN STRL DISP B (DISPOSABLE) ×2 IMPLANT
TUBE CONNECTING 20X1/4 (TUBING) ×2 IMPLANT
WATER STERILE IRR 1000ML POUR (IV SOLUTION) ×1 IMPLANT
YANKAUER SUCT BULB TIP NO VENT (SUCTIONS) ×2 IMPLANT

## 2012-06-21 NOTE — Anesthesia Procedure Notes (Signed)
Procedure Name: LMA Insertion Date/Time: 06/21/2012 9:18 AM Performed by: Kalel Harty D Pre-anesthesia Checklist: Patient identified, Emergency Drugs available, Suction available and Patient being monitored Patient Re-evaluated:Patient Re-evaluated prior to inductionOxygen Delivery Method: Circle System Utilized Preoxygenation: Pre-oxygenation with 100% oxygen Intubation Type: IV induction Ventilation: Mask ventilation without difficulty LMA: LMA inserted LMA Size: 4.0 Number of attempts: 1 Airway Equipment and Method: bite block Placement Confirmation: positive ETCO2 Tube secured with: Tape Dental Injury: Teeth and Oropharynx as per pre-operative assessment

## 2012-06-21 NOTE — Transfer of Care (Signed)
Immediate Anesthesia Transfer of Care Note  Patient: ELLYN RUBIANO  Procedure(s) Performed: Procedure(s) (LRB): BREAST LUMPECTOMY WITH NEEDLE LOCALIZATION (Right)  Patient Location: PACU  Anesthesia Type: General  Level of Consciousness: awake, alert , oriented and patient cooperative  Airway & Oxygen Therapy: Patient Spontanous Breathing and Patient connected to face mask oxygen  Post-op Assessment: Report given to PACU RN and Post -op Vital signs reviewed and stable  Post vital signs: Reviewed and stable  Complications: No apparent anesthesia complications

## 2012-06-21 NOTE — Anesthesia Postprocedure Evaluation (Signed)
Anesthesia Post Note  Patient: Pamela Hendricks  Procedure(s) Performed: Procedure(s) (LRB): BREAST LUMPECTOMY WITH NEEDLE LOCALIZATION (Right)  Anesthesia type: General  Patient location: PACU  Post pain: Pain level controlled  Post assessment: Patient's Cardiovascular Status Stable  Last Vitals:  Filed Vitals:   06/21/12 1141  BP: 119/60  Pulse: 87  Temp: 36.6 C  Resp: 20    Post vital signs: Reviewed and stable  Level of consciousness: alert  Complications: No apparent anesthesia complications

## 2012-06-21 NOTE — Op Note (Signed)
Patient Name:           Pamela Hendricks   Date of Surgery:        06/21/2012  Pre op Diagnosis:      Abnormal mammogram right breast, upper outer quadrant, complex sclerosing lesion  Post op Diagnosis:    same  Procedure:                 Right partial mastectomy with attention to margin excision, needle localization  Surgeon:                     Angelia Mould. Derrell Lolling, M.D., FACS  Assistant:                      none  Operative Indications:   Pamela Hendricks is a 66 y.o. female. She was referred by Dr. Cain Saupe for evaluation of an abnormal mammogram and biopsy of the right breast, revealing complex sclerosing lesion in the upper outer quadrant of the right breast. Her primary care physician is Dr. Theressa Millard.  The patient has not had any prior breast problems. She does not perceive a mass. Recent screening mammograms showed an area of architectural distortion at the 10:30 position of the right breast, 5 cm from the nipple. On July 1 she underwent image guided biopsy which shows complex sclerosing lesion and microcalcifications. Breast exam reveals no significant mass or adenopathy or skin change.  She was referred for surgical consultation and consideration of diagnostic excisional biopsy.   Family history is negative for breast cancer or ovarian cancer.   Operative Findings:       There was no palpable abnormality in the right breast tissue that was removed. The specimen mammogram showed that the marker clip and the wire were contained within the specimen.  Procedure in Detail:          The patient underwent wire localization by Dr. Deboraha Sprang this morning. The wire entered from lateral and was directed medially at about the 10:00 position the right breast. The wire was placed well. The patient was brought to [facility] center and she was taken to the operating room. Gen. Anesthesia was induced. The right breast was prepped and draped in a sterile fashion. Intravenous antibiotics were given. Surgical  time out was performed. 0.5% Marcaine with epinephrine was used as a local infiltration anesthetic. A curvilinear incision was made in the upper outer quadrant of the right breast, near the insertion site of the wire, parallel to the areolar margin. Dissection was carried down into the breast tissue and around the localizing wire. The specimen was removed and  marked with silk sutures and the 6 color ink kit.   Specimen mammogram was performed and was felt to contain the marker clip and the wire and the area of concern. Specimen was sent to the lab for pathologic examination. Hemostasis was excellent and achieved with electrocautery. The wound was irrigated with saline. The breast tissues were closed in several layers with interrupted sutures of 3-0 Vicryl and the skin closed with a running subcuticular suture of 4-0 Monocryl and Dermabond. The patient was taken to recovery in stable condition. There were no complications. EBL 10 cc. Counts correct.     Angelia Mould. Derrell Lolling, M.D., FACS General and Minimally Invasive Surgery Breast and Colorectal Surgery  06/21/2012 10:00 AM

## 2012-06-21 NOTE — Anesthesia Preprocedure Evaluation (Signed)
Anesthesia Evaluation  Patient identified by MRN, date of birth, ID band Patient awake    Reviewed: Allergy & Precautions, H&P , NPO status , Patient's Chart, lab work & pertinent test results, reviewed documented beta blocker date and time   Airway Mallampati: II TM Distance: >3 FB Neck ROM: full    Dental   Pulmonary neg pulmonary ROS,  breath sounds clear to auscultation        Cardiovascular hypertension, On Medications Rhythm:regular     Neuro/Psych  Neuromuscular disease negative neurological ROS  negative psych ROS   GI/Hepatic Neg liver ROS, GERD-  Medicated and Controlled,  Endo/Other  negative endocrine ROS  Renal/GU negative Renal ROS  negative genitourinary   Musculoskeletal   Abdominal   Peds  Hematology negative hematology ROS (+)   Anesthesia Other Findings See surgeon's H&P   Reproductive/Obstetrics negative OB ROS                           Anesthesia Physical Anesthesia Plan  ASA: II  Anesthesia Plan: General   Post-op Pain Management:    Induction: Intravenous  Airway Management Planned: LMA  Additional Equipment:   Intra-op Plan:   Post-operative Plan: Extubation in OR  Informed Consent: I have reviewed the patients History and Physical, chart, labs and discussed the procedure including the risks, benefits and alternatives for the proposed anesthesia with the patient or authorized representative who has indicated his/her understanding and acceptance.   Dental Advisory Given  Plan Discussed with: CRNA and Surgeon  Anesthesia Plan Comments:         Anesthesia Quick Evaluation

## 2012-06-21 NOTE — Interval H&P Note (Signed)
History and Physical Interval Note:  06/21/2012 9:05 AM  Pamela Hendricks  has presented today for surgery, with the diagnosis of right breast mass  The goals and various methods of treatment have been discussed with the patient and family. After consideration of risks, benefits and other options for treatment, the patient has consented to  Procedure(s) (LRB): BREAST LUMPECTOMY WITH NEEDLE LOCALIZATION (Right) as a surgical intervention .  The patient's history has been reviewed, patient examined today, no change in status, stable for surgery.  I have reviewed the patient's chart and labs.  Questions were answered to the patient's satisfaction.     [name]

## 2012-06-23 ENCOUNTER — Telehealth (INDEPENDENT_AMBULATORY_CARE_PROVIDER_SITE_OTHER): Payer: Self-pay

## 2012-06-23 ENCOUNTER — Telehealth (INDEPENDENT_AMBULATORY_CARE_PROVIDER_SITE_OTHER): Payer: Self-pay | Admitting: General Surgery

## 2012-06-23 NOTE — Telephone Encounter (Signed)
Pt calling for path results.  None yet available.  Reassured pt that we will call her results as soon as they are reviewed by Dr. Derrell Lolling.

## 2012-06-23 NOTE — Progress Notes (Signed)
Quick Note:  Inform patient of Pathology report,. ______ 

## 2012-06-23 NOTE — Telephone Encounter (Signed)
Called pt to notify her that her path report shows no malignancy and margins negative per Dr Derrell Lolling. The pt understands.

## 2012-07-20 ENCOUNTER — Encounter (INDEPENDENT_AMBULATORY_CARE_PROVIDER_SITE_OTHER): Payer: Self-pay | Admitting: General Surgery

## 2012-07-20 ENCOUNTER — Ambulatory Visit (INDEPENDENT_AMBULATORY_CARE_PROVIDER_SITE_OTHER): Payer: Medicare Other | Admitting: General Surgery

## 2012-07-20 VITALS — BP 132/82 | HR 80 | Temp 97.8°F | Resp 16 | Ht 65.0 in | Wt 204.6 lb

## 2012-07-20 DIAGNOSIS — R928 Other abnormal and inconclusive findings on diagnostic imaging of breast: Secondary | ICD-10-CM

## 2012-07-20 NOTE — Progress Notes (Signed)
Patient ID: Pamela Hendricks, female   DOB: 1946/08/28, 66 y.o.   MRN: [id number]  History: This patient underwent right partial mastectomy with needle localization on 02/20/2012 because of an abnormal mammogram. Pathology showed a complex sclerosing lesion in the upper outer quadrant of the right breast. No atypia. No malignancy. She has been given a copy of her pathology report. She is doing well and has no complaints about wound healing.  Exam: Patient is in good spirits. Right breast reveals incision upper outer quadrant healing uneventfully. No hematoma. No infection.  Assessment: Complex sclerosing lesion right breast, upper outer quadrant. No atypia. Uneventful recovery following excisional biopsy  Plan: Advised to get annual mammograms in June of each year. Advised to get annual breast exam with Dr. Earl Gala. Return to see me if further problems arise.   Angelia Mould. Derrell Lolling, M.D., [facility] General and Minimally invasive Surgery Breast and Colorectal Surgery Office:   [phone] Pager:   [phone]

## 2012-07-20 NOTE — Patient Instructions (Signed)
The incision on her right breast is healing uneventfully without any obvious complication.  You have been given your pathology report which shows a benign complex sclerosing lesion. No further treatment is required.  Be sure to get annual mammograms in June of each year, and be sure to get an annual breast exam.  Return to see Dr. Derrell Lolling if further problems arise.

## 2013-03-16 ENCOUNTER — Other Ambulatory Visit: Payer: Self-pay

## 2013-03-16 DIAGNOSIS — Z1231 Encounter for screening mammogram for malignant neoplasm of breast: Secondary | ICD-10-CM

## 2013-03-18 ENCOUNTER — Other Ambulatory Visit: Payer: Self-pay | Admitting: Internal Medicine

## 2013-03-18 DIAGNOSIS — R51 Headache: Secondary | ICD-10-CM

## 2013-03-19 ENCOUNTER — Ambulatory Visit
Admission: RE | Admit: 2013-03-19 | Discharge: 2013-03-19 | Disposition: A | Discharge status: Home / Self Care | Payer: Medicare Other | Source: Ambulatory Visit | Attending: Internal Medicine | Admitting: Internal Medicine

## 2013-03-19 DIAGNOSIS — R51 Headache: Secondary | ICD-10-CM

## 2013-03-25 ENCOUNTER — Encounter (HOSPITAL_COMMUNITY): Payer: Self-pay | Admitting: Emergency Medicine

## 2013-03-25 DIAGNOSIS — Z79899 Other long term (current) drug therapy: Secondary | ICD-10-CM | POA: Insufficient documentation

## 2013-03-25 DIAGNOSIS — Z7982 Long term (current) use of aspirin: Secondary | ICD-10-CM | POA: Insufficient documentation

## 2013-03-25 DIAGNOSIS — Z8739 Personal history of other diseases of the musculoskeletal system and connective tissue: Secondary | ICD-10-CM | POA: Insufficient documentation

## 2013-03-25 DIAGNOSIS — R51 Headache: Secondary | ICD-10-CM | POA: Insufficient documentation

## 2013-03-25 DIAGNOSIS — Z8669 Personal history of other diseases of the nervous system and sense organs: Secondary | ICD-10-CM | POA: Insufficient documentation

## 2013-03-25 DIAGNOSIS — Z8709 Personal history of other diseases of the respiratory system: Secondary | ICD-10-CM | POA: Insufficient documentation

## 2013-03-25 DIAGNOSIS — K219 Gastro-esophageal reflux disease without esophagitis: Secondary | ICD-10-CM | POA: Insufficient documentation

## 2013-03-25 DIAGNOSIS — I10 Essential (primary) hypertension: Secondary | ICD-10-CM | POA: Insufficient documentation

## 2013-03-25 DIAGNOSIS — E785 Hyperlipidemia, unspecified: Secondary | ICD-10-CM | POA: Insufficient documentation

## 2013-03-25 LAB — CBC WITH DIFFERENTIAL/PLATELET
Basophils Absolute: 0.1 10*3/uL (ref 0.0–0.1)
Basophils Relative: 1 % (ref 0–1)
Eosinophils Absolute: 0.2 10*3/uL (ref 0.0–0.7)
Eosinophils Relative: 2 % (ref 0–5)
Lymphocytes Relative: 16 % (ref 12–46)
MCHC: 34.2 g/dL (ref 30.0–36.0)
MCV: 92.7 fL (ref 78.0–100.0)
Monocytes Absolute: 0.6 10*3/uL (ref 0.1–1.0)
Platelets: 244 10*3/uL (ref 150–400)
RDW: 12.6 % (ref 11.5–15.5)
WBC: 9.5 10*3/uL (ref 4.0–10.5)

## 2013-03-25 LAB — COMPREHENSIVE METABOLIC PANEL
ALT: 27 U/L (ref 0–35)
AST: 23 U/L (ref 0–37)
Albumin: 4.3 g/dL (ref 3.5–5.2)
CO2: 28 mEq/L (ref 19–32)
Calcium: 10.1 mg/dL (ref 8.4–10.5)
Creatinine, Ser: 0.55 mg/dL (ref 0.50–1.10)
Sodium: 137 mEq/L (ref 135–145)
Total Protein: 7.2 g/dL (ref 6.0–8.3)

## 2013-03-25 NOTE — ED Notes (Signed)
PT. REPORTS PERSISTENT HEADACHE FOR SEVERAL WEEKS , DENIES INJURY , MRI DONE LAST Friday , ADVISED BY PCP TO SEE A NEUROLOGIST , PRESCRIBED WITH HYDROCODONE / AUGMENTIN WITH NO RELIEF.

## 2013-03-26 ENCOUNTER — Emergency Department (HOSPITAL_COMMUNITY)
Admission: EM | Admit: 2013-03-26 | Discharge: 2013-03-26 | Disposition: A | Discharge status: Home / Self Care | Payer: Medicare Other | Attending: Emergency Medicine | Admitting: Emergency Medicine

## 2013-03-26 DIAGNOSIS — R51 Headache: Secondary | ICD-10-CM

## 2013-03-26 MED ORDER — ONDANSETRON 4 MG PO TBDP: 4.0000 mg | ORAL_TABLET | Freq: Three times a day (TID) | ORAL | Status: DC | PRN

## 2013-03-26 MED ORDER — METOCLOPRAMIDE HCL 5 MG/ML IJ SOLN
10.0000 mg | Freq: Once | INTRAMUSCULAR | Status: AC
  Administered 2013-03-26: 10 mg via INTRAMUSCULAR

## 2013-03-26 MED ORDER — DIPHENHYDRAMINE HCL 50 MG/ML IJ SOLN
25.0000 mg | Freq: Once | INTRAMUSCULAR | Status: AC
  Administered 2013-03-26: 25 mg via INTRAMUSCULAR

## 2013-03-26 MED ORDER — METOCLOPRAMIDE HCL 5 MG/ML IJ SOLN
10.0000 mg | Freq: Once | INTRAMUSCULAR | Status: DC
  Filled 2013-03-26: qty 2

## 2013-03-26 MED ORDER — BUTALBITAL-APAP-CAFFEINE 50-325-40 MG PO TABS: 1.0000 | ORAL_TABLET | Freq: Four times a day (QID) | ORAL | Status: DC | PRN

## 2013-03-26 MED ORDER — LORAZEPAM 2 MG/ML IJ SOLN
1.0000 mg | Freq: Once | INTRAMUSCULAR | Status: DC
  Filled 2013-03-26: qty 1

## 2013-03-26 MED ORDER — DIPHENHYDRAMINE HCL 50 MG/ML IJ SOLN
25.0000 mg | Freq: Once | INTRAMUSCULAR | Status: DC
  Filled 2013-03-26: qty 1

## 2013-03-26 MED ORDER — LORAZEPAM 2 MG/ML IJ SOLN
1.0000 mg | Freq: Once | INTRAMUSCULAR | Status: AC
Start: 2013-03-26 — End: 2013-03-26
  Administered 2013-03-26: 1 mg via INTRAMUSCULAR

## 2013-03-26 NOTE — ED Provider Notes (Signed)
History     CSN: [id number]  Arrival date & time 03/25/13  1478   First MD Initiated Contact with Patient 03/26/13 0115      Chief Complaint  Patient presents with  . Headache    (Consider location/radiation/quality/duration/timing/severity/associated sxs/prior treatment) HPI 67 year old female presents to emergency room with complaint of headache.  Patient reports she has had ongoing headaches since February.  Patient had MRI done this past Thursday, and she was told she needed to followup with neurology.  Patient was placed on Vicodin and Augmentin after the MRI.  She's been taking these for the last week.  Headache is constant, lasst all day and all night.  It isn't worse over the last week, prompting her to go to her primary care Dr.  Today she notices the pain was much worse, bringing her to the emergency department.  She was concerned that she had not yet been assigned an appointment in neurology clinic.  She denies any fevers or chills, no next do this.  Her pain, no focal weakness or numbness.  No rashes, no tick bite.  Patient denies previous history of headaches before February.  Past Medical History  Diagnosis Date  . Arthritis   . GERD (gastroesophageal reflux disease)   . Hyperlipidemia   . Hypertension   . Neuromuscular disorder   . Snores   . Wears glasses     Past Surgical History  Procedure Laterality Date  . Joint replacement  2001    Left Knee Replacement  . Abdominal hysterectomy  1970    Total HYST  . Wisdom tooth extraction    . Colonoscopy    . Knee arthroscopy      lt    Family History  Problem Relation Age of Onset  . Cancer Mother   . Hypertension Mother   . Hypertension Father   . Heart disease Father   . Cancer Sister     Brain Ca  . Cancer Brother     Prostate Ca    History  Substance Use Topics  . Smoking status: Never Smoker   . Smokeless tobacco: Not on file  . Alcohol Use: No    OB History   Grav Para Term Preterm Abortions  TAB SAB Ect Mult Living                  Review of Systems  All other systems reviewed and are negative.    Allergies  Review of patient's allergies indicates no known allergies.  Home Medications   Current Outpatient Rx  Name  Route  Sig  Dispense  Refill  . amitriptyline (ELAVIL) 25 MG tablet   Oral   Take 25 mg by mouth at bedtime.          Marland Kitchen amoxicillin-clavulanate (AUGMENTIN) 875-125 MG per tablet   Oral   Take 1 tablet by mouth 2 (two) times daily.         Marland Kitchen aspirin EC 81 MG tablet   Oral   Take 81 mg by mouth daily.         . benazepril (LOTENSIN) 40 MG tablet   Oral   Take 40 mg by mouth daily.          . Calcium Carbonate-Vit D-Min (CALCIUM 1200 PO)   Oral   Take 1 tablet by mouth 2 (two) times daily.          . citalopram (CELEXA) 20 MG tablet   Oral   Take 20 mg  by mouth daily.          Marland Kitchen estradiol (ESTRACE) 2 MG tablet   Oral   Take 2 mg by mouth daily.          . furosemide (LASIX) 20 MG tablet   Oral   Take 20 mg by mouth daily.          Marland Kitchen HYDROcodone-acetaminophen (NORCO/VICODIN) 5-325 MG per tablet   Oral   Take 1 tablet by mouth every 8 (eight) hours as needed for pain.         Marland Kitchen loratadine (CLARITIN) 10 MG tablet   Oral   Take 10 mg by mouth daily.         . mometasone (NASONEX) 50 MCG/ACT nasal spray   Nasal   Place 2 sprays into the nose daily.         . Multiple Vitamins-Minerals (MULTIVITAMIN WITH MINERALS) tablet   Oral   Take 1 tablet by mouth daily.         Marland Kitchen omeprazole (PRILOSEC) 40 MG capsule   Oral   Take 40 mg by mouth daily.          . simvastatin (ZOCOR) 80 MG tablet   Oral   Take 80 mg by mouth every evening.          . vitamin C (ASCORBIC ACID) 500 MG tablet   Oral   Take 500 mg by mouth daily.         . butalbital-acetaminophen-caffeine (FIORICET) 50-325-40 MG per tablet   Oral   Take 1-2 tablets by mouth every 6 (six) hours as needed for headache.   20 tablet   0   .  ondansetron (ZOFRAN ODT) 4 MG disintegrating tablet   Oral   Take 1 tablet (4 mg total) by mouth every 8 (eight) hours as needed for nausea.   20 tablet   0     BP 142/85  Pulse 68  Temp(Src) 98.1 F (36.7 C) (Oral)  Resp 16  SpO2 96%  Physical Exam  Nursing note and vitals reviewed. Constitutional: She is oriented to person, place, and time. She appears well-developed and well-nourished. No distress.  HENT:  Head: Normocephalic and atraumatic.  Right Ear: External ear normal.  Left Ear: External ear normal.  Nose: Nose normal.  Mouth/Throat: Oropharynx is clear and moist.  Eyes: Conjunctivae and EOM are normal. Pupils are equal, round, and reactive to light.  Neck: Normal range of motion. Neck supple. No JVD present. No tracheal deviation present. No thyromegaly present.  Cardiovascular: Normal rate, regular rhythm, normal heart sounds and intact distal pulses.  Exam reveals no gallop and no friction rub.   No murmur heard. Pulmonary/Chest: Effort normal and breath sounds normal. No stridor. No respiratory distress. She has no wheezes. She has no rales. She exhibits no tenderness.  Abdominal: Soft. Bowel sounds are normal. She exhibits no distension and no mass. There is no tenderness. There is no rebound and no guarding.  Musculoskeletal: Normal range of motion. She exhibits no edema and no tenderness.  Lymphadenopathy:    She has no cervical adenopathy.  Neurological: She is alert and oriented to person, place, and time. She has normal reflexes. No cranial nerve deficit. She exhibits normal muscle tone. Coordination normal.  Skin: Skin is warm and dry. No rash noted. No erythema. No pallor.  Psychiatric: She has a normal mood and affect. Her behavior is normal. Judgment and thought content normal.    ED Course  Procedures (including critical care time)  Labs Reviewed  COMPREHENSIVE METABOLIC PANEL - Abnormal; Notable for the following:    Glucose, Bld 120 (*)    All  other components within normal limits  CBC WITH DIFFERENTIAL   No results found.   1. Headache       MDM  67 year old female with acute on chronic headaches.  MRI reviewed, some white matter changes.  Differential given by radiologist includes MS, and/or atypical complex migraine.  Patient is neuro exam here unremarkable, is not in excruciating pain.  I do not feel that symptoms are due to  subarachnoid hemorrhage, pseudotumor cerebri, encephalitis or meningitis.  Patient wishes to followup with neurology.       Olivia Mackie, MD 03/26/13 [phone]

## 2013-03-26 NOTE — ED Notes (Signed)
Pt daughter came up to nurse first with a tick in her hand. Pt found tick un-attached on her head. Tick disposed of.

## 2013-03-26 NOTE — ED Notes (Signed)
Family at bedside. 

## 2013-03-29 ENCOUNTER — Ambulatory Visit (INDEPENDENT_AMBULATORY_CARE_PROVIDER_SITE_OTHER): Payer: Medicare Other | Admitting: Neurology

## 2013-03-29 ENCOUNTER — Encounter: Payer: Self-pay | Admitting: Neurology

## 2013-03-29 VITALS — BP 158/82 | HR 79 | Ht 65.0 in | Wt 202.0 lb

## 2013-03-29 DIAGNOSIS — R519 Headache, unspecified: Secondary | ICD-10-CM | POA: Insufficient documentation

## 2013-03-29 DIAGNOSIS — R51 Headache: Secondary | ICD-10-CM

## 2013-03-29 DIAGNOSIS — G629 Polyneuropathy, unspecified: Secondary | ICD-10-CM | POA: Insufficient documentation

## 2013-03-29 DIAGNOSIS — F329 Major depressive disorder, single episode, unspecified: Secondary | ICD-10-CM

## 2013-03-29 MED ORDER — NAPROXEN 500 MG PO TBEC: 500.0000 mg | DELAYED_RELEASE_TABLET | Freq: Two times a day (BID) | ORAL | Status: DC

## 2013-03-29 MED ORDER — NORTRIPTYLINE HCL 50 MG PO CAPS: 50.0000 mg | ORAL_CAPSULE | Freq: Every day | ORAL | Status: DC

## 2013-03-29 NOTE — Progress Notes (Signed)
History:  67 yo RH WF was referred by primary care physcian Dr. Earl Gala for evaluation of abnormal MRI brain and headaches.  She had past medical history of hyperlipidemia, reported previous history of "sinus headache", her typical headaches are frontal retro-orbital region pressure pain, she also complains of allergy symptoms, such as watering eyes, runny nose, has been treated as sinus headache, taking Claritin every day for many years,. She began to have different headaches since February 2014, starting at the right occipital region, unbearable, 20 out of 10, lasting for a few hours, there was no light noise sensitivity, no nausea, twice a week, severe,.  She was taken to the emergency room because of one episode severe headaches, had MRI of the brain, which we reviewed together, which has demonstrate mild small vessel disease, no acute lesions, chronic right sphenoid sinusitis, acute left sphenoid sinusitis,  She was given prescription of amoxici and also hydrocodone with Tylenol, which only helps her headaches temporarily,  Since her headache started in February, she has daily headaches, pressure, is also exacerbated to a much more severe squeezing twisting pressure headaches,  She has been taking Elavil 20 mg every night for her peripheral neuropathy, bilateral feet paresthesia  Review of Systems  Out of a complete 14 system review, the patient complains of only the following symptoms, and all other reviewed systems are negative.   Constitutional:   N/A Cardiovascular: swelling in legs. Ear/Nose/Throat:  Trouble swallowing Skin: N/A Eyes: N/A Respiratory: cough Gastroitestinal: constipation    Hematology/Lymphatic:  Easy bruising, bleeding Endocrine:  N/A Musculoskeletal: joints pain, swelling, aching muscles Allergy/Immunology: allergy, runny nose, skin sensitivity Neurological: headaches, numbness, snoring, restless legs Psychiatric:    N/A  PHYSICAL EXAMINATOINS:  Generalized:  In no acute distress  Neck: Supple, no carotid bruits   Cardiac: Regular rate rhythm  Pulmonary: Clear to auscultation bilaterally  Musculoskeletal: No deformity  Neurological examination  Mentation: Alert oriented to time, place, history taking, and causual conversation  Cranial nerve II-XII: Pupils were equal round reactive to light extraocular movements were full, visual field were full on confrontational test. facial sensation and strength were normal. hearing was intact to finger rubbing bilaterally. Uvula tongue midline.  head turning and shoulder shrug and were normal and symmetric.Tongue protrusion into cheek strength was normal.  Motor: normal tone, bulk and strength.  Sensory: Intact to fine touch, pinprick, preserved vibratory sensation, and proprioception at toes.  Coordination: Normal finger to nose, heel-to-shin bilaterally there was no truncal ataxia  Gait: Rising up from seated position without assistance, normal stance, without trunk ataxia, moderate stride, good arm swing, smooth turning, able to perform tiptoe, and heel walking without difficulty.   Romberg signs: Negative  Deep tendon reflexes: Brachioradialis 2/2, biceps 2/2, triceps 2/2, patellar 2/2, Achilles 2/2, plantar responses were flexor bilaterally.   Assessment and plan:  67 years old right-handed Caucasian female, with past medical history of frequent bifrontal headache, now presenting with more severe headaches, MRI of the brain showed small vessel disease, no acute lesions, possible acute left sphenoid sinusitis  1 complete 10 days course of amoxicillin, 2. Change to nortriptyline 50 mg every night as preventive medications, her headache is most consistent with tension headaches likely in some migraine features, 3. naproxen as needed 4. Return to clinic in 2 months

## 2013-05-03 ENCOUNTER — Ambulatory Visit: Payer: Medicare Other

## 2013-05-31 ENCOUNTER — Other Ambulatory Visit (HOSPITAL_COMMUNITY): Payer: Self-pay | Admitting: Internal Medicine

## 2013-05-31 DIAGNOSIS — Z1231 Encounter for screening mammogram for malignant neoplasm of breast: Secondary | ICD-10-CM

## 2013-06-23 ENCOUNTER — Ambulatory Visit (HOSPITAL_COMMUNITY): Payer: Medicare Other

## 2013-07-04 ENCOUNTER — Ambulatory Visit (INDEPENDENT_AMBULATORY_CARE_PROVIDER_SITE_OTHER): Payer: Medicare Other | Admitting: Nurse Practitioner

## 2013-07-04 ENCOUNTER — Encounter: Payer: Self-pay | Admitting: Nurse Practitioner

## 2013-07-04 ENCOUNTER — Ambulatory Visit (HOSPITAL_COMMUNITY)
Admission: RE | Admit: 2013-07-04 | Discharge: 2013-07-04 | Disposition: A | Discharge status: Home / Self Care | Payer: Medicare Other | Source: Ambulatory Visit | Attending: Internal Medicine | Admitting: Internal Medicine

## 2013-07-04 VITALS — BP 140/76 | HR 80 | Ht 65.0 in | Wt 208.0 lb

## 2013-07-04 DIAGNOSIS — R930 Abnormal findings on diagnostic imaging of skull and head, not elsewhere classified: Secondary | ICD-10-CM | POA: Insufficient documentation

## 2013-07-04 DIAGNOSIS — R51 Headache: Secondary | ICD-10-CM

## 2013-07-04 DIAGNOSIS — Z1231 Encounter for screening mammogram for malignant neoplasm of breast: Secondary | ICD-10-CM | POA: Insufficient documentation

## 2013-07-04 NOTE — Progress Notes (Signed)
Reason for visit follow up for  headaches  HPI: Pamela Hendricks, 67 year old white female returns for followup. She was initially evaluated by Dr. Terrace Arabia 03/29/2013 for  evaluation of abnormal MRI brain and headaches.  She had past medical history of hyperlipidemia, reported previous history of "sinus headache", her typical headaches are frontal retro-orbital region pressure pain, she also complains of allergy symptoms, such as watering eyes, runny nose, has been treated as sinus headache, taking Claritin every day for many years,.  She began to have different headaches since February 2014, starting at the right occipital region, unbearable, 10 out of 10, lasting for a few hours, there was no light noise sensitivity, no nausea, twice a week, severe,.  She was taken to the emergency room because of one episode severe headaches, had MRI of the brain, which we reviewed together, which has demonstrate mild small vessel disease, no acute lesions, chronic right sphenoid sinusitis, acute left sphenoid sinusitis, She was given prescription of amoxicin and also hydrocodone with Tylenol, which only helps her headaches temporarily,  Since her headache started in February, she has daily headaches, pressure, is also exacerbated to a much more severe squeezing twisting pressure headaches, 07/04/13: She was placed on Pamelor 50 mg by Dr. Terrace Arabia as well as Naprosyn 500 mg one tablet twice a day if needed. Her headaches are in better control   ROS:  Easy bruising, itching, allergies   Medications Current Outpatient Prescriptions on File Prior to Visit  Medication Sig Dispense Refill  . aspirin EC 81 MG tablet Take 81 mg by mouth daily.      . benazepril (LOTENSIN) 40 MG tablet Take 40 mg by mouth daily.       . Calcium Carbonate-Vit D-Min (CALCIUM 1200 PO) Take 1 tablet by mouth 2 (two) times daily.       . citalopram (CELEXA) 20 MG tablet Take 20 mg by mouth daily.       Marland Kitchen estradiol (ESTRACE) 2 MG tablet Take 2 mg by mouth  daily.       . furosemide (LASIX) 20 MG tablet Take 20 mg by mouth daily.       Marland Kitchen HYDROcodone-acetaminophen (NORCO/VICODIN) 5-325 MG per tablet Take 1 tablet by mouth every 8 (eight) hours as needed for pain.      Marland Kitchen loratadine (CLARITIN) 10 MG tablet Take 10 mg by mouth daily.      . mometasone (NASONEX) 50 MCG/ACT nasal spray Place 2 sprays into the nose daily.      . Multiple Vitamins-Minerals (MULTIVITAMIN WITH MINERALS) tablet Take 1 tablet by mouth daily.      . naproxen (EC NAPROSYN) 500 MG EC tablet Take 1 tablet (500 mg total) by mouth 2 (two) times daily with a meal.  60 tablet  6  . nortriptyline (PAMELOR) 50 MG capsule Take 1 capsule (50 mg total) by mouth at bedtime.  30 capsule  12  . omeprazole (PRILOSEC) 40 MG capsule Take 40 mg by mouth daily.       . simvastatin (ZOCOR) 80 MG tablet Take 80 mg by mouth every evening.       . vitamin C (ASCORBIC ACID) 500 MG tablet Take 500 mg by mouth daily.       No current facility-administered medications on file prior to visit.    Allergies No Known Allergies  Physical Exam General: well developed, well nourished, seated, in no evident distress Head: head normocephalic and atraumatic. Oropharynx benign Neck: supple with no carotid  bruits  Cardiovascular: regular rate and rhythm, no murmurs  Neurologic Exam Mental Status: Awake and fully alert. Oriented to place and time. Follows all commands. Speech and language normal.   Cranial Nerves:  Pupils equal, briskly reactive to light. Extraocular movements full without nystagmus. Visual fields full to confrontation. Hearing intact and symmetric to finger snap. Facial sensation intact. Face, tongue, palate move normally and symmetrically. Neck flexion and extension normal.  Motor: Normal bulk and tone. Normal strength in all tested extremity muscles.No focal weakness Sensory.: intact to touch and pinprick and vibratory.  Coordination: Rapid alternating movements normal in all extremities.  Finger-to-nose and heel-to-shin performed accurately bilaterally. Gait and Station: Arises from chair without difficulty. Stance is normal. Able to heel, toe and tandem walk without difficulty.  Reflexes: 2+ and symmetric. Toes downgoing.     ASSESSMENT: 67 year old with past medical history of frequent bifrontal headache, MRI of the brain with small vessel disease no acute lesions, doing well on nortriptyline 50 every night and Naprosyn as needed     PLAN: Continue nortriptyline 50 mg at bedtime as prevention for headache, also used for peripheral neuropathy Make Naprosyn prn when necessary  If headaches worsen give me a call Followup in 6 months Pamela Hendricks, GNP-BC APRN

## 2013-07-04 NOTE — Patient Instructions (Addendum)
Continue nortriptyline 50 mg at bedtime as prevention for headache, also used for peripheral neuropathy Make Naprosyn prn when necessary  If headaches worsen give me a call Followup in 6 months

## 2013-07-04 NOTE — Progress Notes (Signed)
I have read the note, and I agree with the clinical assessment and plan.  WILLIS,CHARLES KEITH   

## 2013-07-07 ENCOUNTER — Telehealth: Payer: Self-pay | Admitting: Neurology

## 2013-07-07 NOTE — Telephone Encounter (Signed)
Patient will like a script mailed to her for a 90 day supply of naproxen as needed. Patient has been taking 2 tabs daily because the bottle says to take it this way. She needs a new script saying prn.

## 2013-07-08 MED ORDER — NAPROXEN 500 MG PO TBEC: 500.0000 mg | DELAYED_RELEASE_TABLET | ORAL | Status: DC | PRN

## 2013-07-08 NOTE — Telephone Encounter (Signed)
Jessica can you take care of this 

## 2013-07-08 NOTE — Telephone Encounter (Signed)
No more than 2 daily and only prn . Pt was taking 2 daily and not taking prn

## 2013-07-08 NOTE — Telephone Encounter (Signed)
For ins reasons, the Rx cannot have directions of PRN.  Specific dosing is required.  Max dose is 1500 mg daily.  Patient's current Rx is for 1000 mg (two 500 mg tabs) daily.  Pamela Hendricks, I just want to verify the patient's Rx should be changed to the max dose?  Please advise.  Thank you.

## 2013-07-08 NOTE — Telephone Encounter (Signed)
Left message for patient at [phone] that she can take 2 daily and only when she needs too, but no more than 2 daily.  Told to call with any further questions.

## 2014-01-03 ENCOUNTER — Ambulatory Visit: Payer: Medicare Other | Admitting: Nurse Practitioner

## 2014-03-01 ENCOUNTER — Other Ambulatory Visit (HOSPITAL_COMMUNITY): Payer: Self-pay | Admitting: Internal Medicine

## 2014-03-01 DIAGNOSIS — Z1231 Encounter for screening mammogram for malignant neoplasm of breast: Secondary | ICD-10-CM

## 2014-03-20 ENCOUNTER — Other Ambulatory Visit: Payer: Self-pay

## 2014-03-20 MED ORDER — NAPROXEN 500 MG PO TBEC: 500.0000 mg | DELAYED_RELEASE_TABLET | ORAL | Status: DC | PRN

## 2014-04-19 ENCOUNTER — Other Ambulatory Visit: Payer: Self-pay | Admitting: Neurology

## 2014-06-20 ENCOUNTER — Other Ambulatory Visit: Payer: Self-pay | Admitting: Neurology

## 2014-07-05 ENCOUNTER — Ambulatory Visit (HOSPITAL_COMMUNITY)
Admission: RE | Admit: 2014-07-05 | Discharge: 2014-07-05 | Disposition: A | Discharge status: Home / Self Care | Payer: Medicare Other | Source: Ambulatory Visit | Attending: Internal Medicine | Admitting: Internal Medicine

## 2014-07-05 DIAGNOSIS — Z1231 Encounter for screening mammogram for malignant neoplasm of breast: Secondary | ICD-10-CM | POA: Insufficient documentation

## 2014-07-18 ENCOUNTER — Other Ambulatory Visit: Payer: Self-pay | Admitting: Neurology

## 2014-07-19 ENCOUNTER — Other Ambulatory Visit: Payer: Self-pay | Admitting: Neurology

## 2014-09-01 ENCOUNTER — Emergency Department (HOSPITAL_COMMUNITY): Payer: Medicare Other

## 2014-09-01 ENCOUNTER — Emergency Department (HOSPITAL_COMMUNITY)
Admission: EM | Admit: 2014-09-01 | Discharge: 2014-09-01 | Disposition: A | Discharge status: Home / Self Care | Payer: Medicare Other | Attending: Emergency Medicine | Admitting: Emergency Medicine

## 2014-09-01 ENCOUNTER — Encounter (HOSPITAL_COMMUNITY): Payer: Self-pay | Admitting: Emergency Medicine

## 2014-09-01 DIAGNOSIS — Z7982 Long term (current) use of aspirin: Secondary | ICD-10-CM | POA: Insufficient documentation

## 2014-09-01 DIAGNOSIS — Z7951 Long term (current) use of inhaled steroids: Secondary | ICD-10-CM | POA: Insufficient documentation

## 2014-09-01 DIAGNOSIS — E785 Hyperlipidemia, unspecified: Secondary | ICD-10-CM | POA: Diagnosis not present

## 2014-09-01 DIAGNOSIS — R42 Dizziness and giddiness: Secondary | ICD-10-CM | POA: Diagnosis not present

## 2014-09-01 DIAGNOSIS — I10 Essential (primary) hypertension: Secondary | ICD-10-CM | POA: Diagnosis not present

## 2014-09-01 DIAGNOSIS — K859 Acute pancreatitis, unspecified: Secondary | ICD-10-CM | POA: Diagnosis not present

## 2014-09-01 DIAGNOSIS — Z973 Presence of spectacles and contact lenses: Secondary | ICD-10-CM | POA: Insufficient documentation

## 2014-09-01 DIAGNOSIS — R079 Chest pain, unspecified: Secondary | ICD-10-CM

## 2014-09-01 DIAGNOSIS — K219 Gastro-esophageal reflux disease without esophagitis: Secondary | ICD-10-CM | POA: Insufficient documentation

## 2014-09-01 DIAGNOSIS — M1990 Unspecified osteoarthritis, unspecified site: Secondary | ICD-10-CM | POA: Diagnosis not present

## 2014-09-01 DIAGNOSIS — Z8669 Personal history of other diseases of the nervous system and sense organs: Secondary | ICD-10-CM | POA: Diagnosis not present

## 2014-09-01 DIAGNOSIS — R0602 Shortness of breath: Secondary | ICD-10-CM | POA: Diagnosis not present

## 2014-09-01 DIAGNOSIS — Z79899 Other long term (current) drug therapy: Secondary | ICD-10-CM | POA: Insufficient documentation

## 2014-09-01 LAB — CBC
HCT: 39.5 % (ref 36.0–46.0)
Hemoglobin: 13.3 g/dL (ref 12.0–15.0)
MCH: 31 pg (ref 26.0–34.0)
MCHC: 33.7 g/dL (ref 30.0–36.0)
MCV: 92.1 fL (ref 78.0–100.0)
Platelets: 285 10*3/uL (ref 150–400)
RBC: 4.29 MIL/uL (ref 3.87–5.11)
RDW: 12.5 % (ref 11.5–15.5)
WBC: 10.7 10*3/uL — AB (ref 4.0–10.5)

## 2014-09-01 LAB — BASIC METABOLIC PANEL
Anion gap: 14 (ref 5–15)
BUN: 9 mg/dL (ref 6–23)
CHLORIDE: 98 meq/L (ref 96–112)
CO2: 27 meq/L (ref 19–32)
Calcium: 9.5 mg/dL (ref 8.4–10.5)
Creatinine, Ser: 0.53 mg/dL (ref 0.50–1.10)
GFR calc non Af Amer: >90 mL/min (ref >90)
Glucose, Bld: 125 mg/dL — ABNORMAL HIGH (ref 70–99)
Potassium: 4 mEq/L (ref 3.7–5.3)
Sodium: 139 mEq/L (ref 137–147)

## 2014-09-01 LAB — D-DIMER, QUANTITATIVE (NOT AT ARMC): D-Dimer, Quant: 0.58 ug/mL-FEU — ABNORMAL HIGH (ref 0.00–0.48)

## 2014-09-01 LAB — LIPASE, BLOOD: LIPASE: 46 U/L (ref 11–59)

## 2014-09-01 LAB — PRO B NATRIURETIC PEPTIDE: Pro B Natriuretic peptide (BNP): 143 pg/mL — ABNORMAL HIGH (ref 0–125)

## 2014-09-01 LAB — I-STAT TROPONIN, ED: Troponin i, poc: 0 ng/mL (ref 0.00–0.08)

## 2014-09-01 MED ORDER — IOHEXOL 350 MG/ML SOLN
100.0000 mL | Freq: Once | INTRAVENOUS | Status: AC | PRN
  Administered 2014-09-01: 100 mL via INTRAVENOUS

## 2014-09-01 MED ORDER — HYDROMORPHONE HCL 1 MG/ML IJ SOLN
0.5000 mg | Freq: Once | INTRAMUSCULAR | Status: AC
  Administered 2014-09-01: 0.5 mg via INTRAVENOUS
  Filled 2014-09-01: qty 1

## 2014-09-01 MED ORDER — OXYCODONE-ACETAMINOPHEN 5-325 MG PO TABS
1.0000 | ORAL_TABLET | ORAL | Status: DC | PRN
Start: 2014-09-01 — End: 2014-09-01

## 2014-09-01 MED ORDER — ONDANSETRON HCL 4 MG PO TABS: 4.0000 mg | ORAL_TABLET | Freq: Four times a day (QID) | ORAL | Status: DC

## 2014-09-01 MED ORDER — ASPIRIN 81 MG PO CHEW
324.0000 mg | CHEWABLE_TABLET | Freq: Once | ORAL | Status: AC
  Administered 2014-09-01: 324 mg via ORAL
  Filled 2014-09-01: qty 4

## 2014-09-01 MED ORDER — NITROGLYCERIN 0.4 MG SL SUBL: 0.4000 mg | SUBLINGUAL_TABLET | SUBLINGUAL | Status: DC | PRN

## 2014-09-01 MED ORDER — OXYCODONE-ACETAMINOPHEN 5-325 MG PO TABS: 1.0000 | ORAL_TABLET | Freq: Four times a day (QID) | ORAL | Status: DC | PRN

## 2014-09-01 NOTE — ED Notes (Signed)
Pt states that since Sunday she's had CP described as pressure associated with SOB, back pain, and dizziness.  Pt states it "feels like a truck on my chest and goes through to my back".

## 2014-09-01 NOTE — ED Notes (Signed)
Gentry, MD at bedside.

## 2014-09-01 NOTE — ED Notes (Signed)
CT called and reports that 20g is required for scan.  This RN attempted x1.  IV team to be called for access.

## 2014-09-01 NOTE — Discharge Instructions (Signed)
Please followup with your primary care physician. He will need an outpatient ultrasound of your abdomen to look for gallstones. There were also incidental pulmonary nodules seen on your CT scan. Please discuss this with your PCP for appropriate follow up. Please return to emergency department for new or worsening symptoms.  Acute Pancreatitis Acute pancreatitis is a disease in which the pancreas becomes suddenly irritated (inflamed). The pancreas is a large gland behind your stomach. The pancreas makes enzymes that help digest food. The pancreas also makes 2 hormones that help control your blood sugar. Acute pancreatitis happens when the enzymes attack and damage the pancreas. Most attacks last a couple of days and can cause serious problems. HOME CARE  Follow your doctor's diet instructions. You may need to avoid alcohol and limit fat in your diet.  Eat small meals often.  Drink enough fluids to keep your pee (urine) clear or pale yellow.  Only take medicines as told by your doctor.  Avoid drinking alcohol if it caused your disease.  Do not smoke.  Get plenty of rest.  Check your blood sugar at home as told by your doctor.  Keep all doctor visits as told. GET HELP IF:  You do not get better as quickly as expected.  You have new or worsening symptoms.  You have lasting pain, weakness, or feel sick to your stomach (nauseous).  You get better and then have another pain attack. GET HELP RIGHT AWAY IF:   You are unable to eat or keep fluids down.  Your pain becomes severe.  You have a fever or lasting symptoms for more than 2 to 3 days.  You have a fever and your symptoms suddenly get worse.  Your skin or the white part of your eyes turn yellow (jaundice).  You throw up (vomit).  You feel dizzy, or you pass out (faint).  Your blood sugar is high (over 300 mg/dL). MAKE SURE YOU:   Understand these instructions.  Will watch your condition.  Will get help right away if  you are not doing well or get worse. Document Released: 04/14/2008 Document Revised: 03/13/2014 Document Reviewed: 02/05/2012 [facility] Patient Information 2015 [place], [place]. This information is not intended to replace advice given to you by your health care provider. Make sure you discuss any questions you have with your health care provider.  Chest Pain (Nonspecific) It is often hard to give a diagnosis for the cause of chest pain. There is always a chance that your pain could be related to something serious, such as a heart attack or a blood clot in the lungs. You need to follow up with your doctor. HOME CARE  If antibiotic medicine was given, take it as directed by your doctor. Finish the medicine even if you start to feel better.  For the next few days, avoid activities that bring on chest pain. Continue physical activities as told by your doctor.  Do not use any tobacco products. This includes cigarettes, chewing tobacco, and e-cigarettes.  Avoid drinking alcohol.  Only take medicine as told by your doctor.  Follow your doctor's suggestions for more testing if your chest pain does not go away.  Keep all doctor visits you made. GET HELP IF:  Your chest pain does not go away, even after treatment.  You have a rash with blisters on your chest.  You have a fever. GET HELP RIGHT AWAY IF:   You have more pain or pain that spreads to your arm, neck, jaw, back,  or belly (abdomen).  You have shortness of breath.  You cough more than usual or cough up blood.  You have very bad back or belly pain.  You feel sick to your stomach (nauseous) or throw up (vomit).  You have very bad weakness.  You pass out (faint).  You have chills. This is an emergency. Do not wait to see if the problems will go away. Call your local emergency services (911 in [place] Do not drive yourself to the hospital. MAKE SURE YOU:   Understand these instructions.  Will watch your condition.  Will  get help right away if you are not doing well or get worse. Document Released: 04/14/2008 Document Revised: 11/01/2013 Document Reviewed: 04/14/2008 [facility] Patient Information 2015 [place], [place]. This information is not intended to replace advice given to you by your health care provider. Make sure you discuss any questions you have with your health care provider.

## 2014-09-01 NOTE — ED Provider Notes (Signed)
CSN: [id number]     Arrival date & time 09/01/14  1000 History   First MD Initiated Contact with Patient 09/01/14 1055     Chief Complaint  Patient presents with  . Chest Pain     (Consider location/radiation/quality/duration/timing/severity/associated sxs/prior Treatment) HPI Comments: Patient is a 68 year old female with history of arthritis, GERD, hyperlipidemia, hypertension who presents the emergency department today for evaluation of chest pain. Her pain has been constant for the past 5 days. She reports that it is a squeezing, tight pain with radiation to her back. She feels mildly short of breath. Laying flat worsens her pain. She felt lightheaded yesterday. She denies any cardiac history. She does not smoke. Pain is not pleurtic. She mild associated nausea without vomiting.   Patient is a 68 y.o. female presenting with chest pain. The history is provided by the patient. No language interpreter was used.  Chest Pain Associated symptoms: nausea and shortness of breath (mild)   Associated symptoms: no abdominal pain, no diaphoresis, no fever and not vomiting     Past Medical History  Diagnosis Date  . Arthritis   . GERD (gastroesophageal reflux disease)   . Hyperlipidemia   . Hypertension   . Neck pain   . Snores   . Wears glasses   . Peripheral neuropathy    Past Surgical History  Procedure Laterality Date  . Joint replacement  2001    Left Knee Replacement  . Abdominal hysterectomy  1970    Total HYST  . Wisdom tooth extraction    . Colonoscopy    . Knee arthroscopy      lt   Family History  Problem Relation Age of Onset  . Cancer Mother   . Hypertension Mother   . Hypertension Father   . Heart disease Father   . Cancer Sister     Brain Ca  . Cancer Brother     Prostate Ca   History  Substance Use Topics  . Smoking status: Never Smoker   . Smokeless tobacco: Never Used  . Alcohol Use: No   OB History   Grav Para Term Preterm Abortions TAB SAB Ect  Mult Living                 Review of Systems  Constitutional: Negative for fever, chills and diaphoresis.  Respiratory: Positive for shortness of breath (mild).   Cardiovascular: Positive for chest pain.  Gastrointestinal: Positive for nausea. Negative for vomiting and abdominal pain.  Neurological: Positive for light-headedness.  All other systems reviewed and are negative.     Allergies  Review of patient's allergies indicates no known allergies.  Home Medications   Prior to Admission medications   Medication Sig Start Date End Date Taking? Authorizing Provider  aspirin EC 81 MG tablet Take 81 mg by mouth daily.    Historical Provider, MD  benazepril (LOTENSIN) 40 MG tablet Take 40 mg by mouth daily.  06/01/12   Historical Provider, MD  Calcium Carbonate-Vit D-Min (CALCIUM 1200 PO) Take 1 tablet by mouth 2 (two) times daily.     Historical Provider, MD  citalopram (CELEXA) 20 MG tablet Take 20 mg by mouth daily.  04/23/12   Historical Provider, MD  estradiol (ESTRACE) 2 MG tablet Take 2 mg by mouth daily.  06/01/12   Historical Provider, MD  furosemide (LASIX) 20 MG tablet Take 20 mg by mouth daily.  06/01/12   Historical Provider, MD  HYDROcodone-acetaminophen (NORCO/VICODIN) 5-325 MG per tablet Take 1 tablet  by mouth every 8 (eight) hours as needed for pain.    Historical Provider, MD  loratadine (CLARITIN) 10 MG tablet Take 10 mg by mouth daily.    Historical Provider, MD  mometasone (NASONEX) 50 MCG/ACT nasal spray Place 2 sprays into the nose daily.    Historical Provider, MD  Multiple Vitamins-Minerals (MULTIVITAMIN WITH MINERALS) tablet Take 1 tablet by mouth daily.    Historical Provider, MD  naproxen (EC NAPROSYN) 500 MG EC tablet Take 1 tablet (500 mg total) by mouth as needed (Max 2 tablets in 24 hours.  Please only take IF needed). 03/20/14   Marcial Pacas, MD  nortriptyline (PAMELOR) 50 MG capsule take 1 capsule by mouth at bedtime 07/19/14   Marcial Pacas, MD  omeprazole  (PRILOSEC) 40 MG capsule Take 40 mg by mouth daily.  05/31/12   Historical Provider, MD  simvastatin (ZOCOR) 80 MG tablet Take 80 mg by mouth every evening.  05/31/12   Historical Provider, MD  vitamin C (ASCORBIC ACID) 500 MG tablet Take 500 mg by mouth daily.    Historical Provider, MD   BP 130/62  Pulse 94  Temp(Src) 98.8 F (37.1 C) (Oral)  Resp 17  Ht 5\' 5"  (1.651 m)  Wt 205 lb (92.987 kg)  BMI 34.11 kg/m2  SpO2 96% Physical Exam  Nursing note and vitals reviewed. Constitutional: She is oriented to person, place, and time. She appears well-developed and well-nourished. No distress.  Very well appearing, no distress  HENT:  Head: Normocephalic and atraumatic.  Right Ear: External ear normal.  Left Ear: External ear normal.  Nose: Nose normal.  Mouth/Throat: Oropharynx is clear and moist.  Eyes: Conjunctivae are normal.  Neck: Normal range of motion.  Cardiovascular: Normal rate, regular rhythm, normal heart sounds, intact distal pulses and normal pulses.   Pulses:      Radial pulses are 2+ on the right side, and 2+ on the left side.       Posterior tibial pulses are 2+ on the right side, and 2+ on the left side.  Pulmonary/Chest: Effort normal and breath sounds normal. No stridor. No respiratory distress. She has no wheezes. She has no rales.  Abdominal: Soft. She exhibits no distension. There is no tenderness.  Musculoskeletal: Normal range of motion.  Neurological: She is alert and oriented to person, place, and time. She has normal strength. Coordination and gait normal.  Skin: Skin is warm and dry. She is not diaphoretic. No erythema.  Psychiatric: She has a normal mood and affect. Her behavior is normal.    ED Course  Procedures (including critical care time) Labs Review Labs Reviewed  CBC - Abnormal; Notable for the following:    WBC 10.7 (*)    All other components within normal limits  BASIC METABOLIC PANEL - Abnormal; Notable for the following:    Glucose, Bld  125 (*)    All other components within normal limits  D-DIMER, QUANTITATIVE - Abnormal; Notable for the following:    D-Dimer, Quant 0.58 (*)    All other components within normal limits  PRO B NATRIURETIC PEPTIDE - Abnormal; Notable for the following:    Pro B Natriuretic peptide (BNP) 143.0 (*)    All other components within normal limits  LIPASE, BLOOD  I-STAT TROPOININ, ED    Imaging Review Dg Chest 2 View  09/01/2014   CLINICAL DATA:  Chest pressure for 5 days with shortness of Breath  EXAM: CHEST  2 VIEW  COMPARISON:  None.  FINDINGS: The heart size and mediastinal contours are within normal limits. Both lungs are clear. The visualized skeletal structures are unremarkable.  IMPRESSION: No active cardiopulmonary disease.   Electronically Signed   By: Inez Catalina M.D.   On: 09/01/2014 11:08   Ct Angio Chest Aortic Dissect W &/or W/o  09/01/2014   CLINICAL DATA:  Diffuse chest and back pressure and tightness for 6 days. Shortness of breath.  EXAM: CT ANGIOGRAPHY CHEST, ABDOMEN AND PELVIS  TECHNIQUE: Multidetector CT imaging was performed through the chest without contrast. Multidetector CT imaging through the chest, abdomen and pelvis was performed using the standard protocol during bolus administration of intravenous contrast. Multiplanar reconstructed images and MIPs were obtained and reviewed to evaluate the vascular anatomy.  CONTRAST:  1102mL OMNIPAQUE IOHEXOL 350 MG/ML SOLN  COMPARISON:  Chest radiograph 09/01/2014  FINDINGS: CTA CHEST FINDINGS  Noncontrast images through the chest show scattered atherosclerotic calcification of the normal caliber thoracic aorta. No intramural hematoma is identified on noncontrast images. Coronary artery atherosclerotic calcifications are visualized in the left common coronary artery, left anterior descending coronary artery, and right coronary artery.  Following contrast enhancement, the thoracic aorta opacifies well with contrast. Negative for aortic  dissection or aneurysm. There is slight focal ectasia of the proximal descending thoracic aorta without aneurysm. Maximum AP diameter of the proximal descending thoracic aorta is 2.7 cm.  Proximal great vessels opacify normally and are normal in caliber.  Heart size is within normal limits. Coronary artery atherosclerosis appears  There is a small hiatal hernia.  Negative for pleural or pericardial effusion.  The lungs are fairly well expanded, with some typical dependent atelectasis in both lower lobes. Negative for airspace disease. A few tiny peripheral pulmonary nodules are seen in the right upper lobe. Example 3 mm peripheral nodule right upper lobe on image number 33. A 2 mm peripheral pulmonary nodule right upper lobe on image number 38. There are a few additional 1-2 mm peripheral pulmonary nodules in the right upper lobe.  Thoracic spine vertebral bodies are normal in height and alignment. No acute or suspicious bony abnormality is identified.  Review of the MIP images confirms the above findings.  CTA ABDOMEN AND PELVIS FINDINGS  There is moderate atherosclerotic calcification of the normal caliber abdominal aorta wound. Negative for abdominal aortic aneurysm, ectasia, or dissection. No hemodynamically significant narrowing of the abdominal aorta.  Celiac axis is widely patent. There is atherosclerotic calcification of the origin of superior mesenteric artery, without significant narrowing. Superior mesenteric artery opacifies normally with contrast. There is atherosclerotic calcification just proximal to the origin of the inferior mesenteric artery. The inferior mesenteric artery is widely patent and opacifies with contrast. The renal arteries are patent bilaterally and show no significant atherosclerotic change.  There is mild atherosclerotic change of the normal caliber common iliac arteries bilaterally and the proximal internal iliac arteries. The proximal femoral arteries show no significant  atherosclerotic change.  Arterial phase imaging of the liver, spleen, adrenal glands, and kidneys is within normal limits.  There is mild focal mesenteric stranding adjacent to the inferior aspect of the pancreatic head (images 106 through 109 on series 5). The parenchyma of the pancreas enhances normally. No pancreatic mass, calcification, or ductal dilatation identified.  Stomach is decompressed. Small bowel loops are normal in caliber. No small bowel wall thickening is identified.  The appendix is retrocecal and normal.  There is a moderate amount of stool throughout the colon. There is moderate diverticulosis of the colon,  most prominent in the sigmoid region. No definite acute diverticulitis is identified.  Negative for the abdominopelvic ascites or lymphadenopathy. Inguinal regions unremarkable. Tiny umbilical hernia noted. Otherwise, abdominal wall is unremarkable.  Lumbar spine vertebral bodies are normal in height and alignment. No acute or suspicious bony abnormality is seen.  Review of the MIP images confirms the above findings.  IMPRESSION: 1. Negative for thoracoabdominal aortic dissection or aneurysm. Negative for thoracic aorta intramural hematoma barium 2. Mild focal mesenteric stranding adjacent to the pancreatic head. Mild pancreatitis cannot be excluded. Duodenitis can also cause a similar appearance. 3. Atherosclerotic changes of the thoracic aorta with mild focal ectasia of the proximal descending thoracic aorta. 4. Atherosclerotic changes of the abdominal aorta and iliac vasculature, without significant luminal narrowing. The branch vessels of the abdominal aorta are patent. 5. Coronary artery atherosclerotic calcifications. 6. Small hiatal hernia. 7. Several tiny right upper lobe pulmonary nodules. If the patient is at high risk for bronchogenic carcinoma, follow-up chest CT at 1 year is recommended. If the patient is at low risk, no follow-up is needed. This recommendation follows the  consensus statement: Guidelines for Management of Small Pulmonary Nodules Detected on CT Scans: A Statement from the [place] as published in Radiology 2005; 237:395-400.   Electronically Signed   By: Curlene Dolphin M.D.   On: 09/01/2014 15:57   Ct Angio Abd/pel W/ And/or W/o  09/01/2014   CLINICAL DATA:  Diffuse chest and back pressure and tightness for 6 days. Shortness of breath.  EXAM: CT ANGIOGRAPHY CHEST, ABDOMEN AND PELVIS  TECHNIQUE: Multidetector CT imaging was performed through the chest without contrast. Multidetector CT imaging through the chest, abdomen and pelvis was performed using the standard protocol during bolus administration of intravenous contrast. Multiplanar reconstructed images and MIPs were obtained and reviewed to evaluate the vascular anatomy.  CONTRAST:  129mL OMNIPAQUE IOHEXOL 350 MG/ML SOLN  COMPARISON:  Chest radiograph 09/01/2014  FINDINGS: CTA CHEST FINDINGS  Noncontrast images through the chest show scattered atherosclerotic calcification of the normal caliber thoracic aorta. No intramural hematoma is identified on noncontrast images. Coronary artery atherosclerotic calcifications are visualized in the left common coronary artery, left anterior descending coronary artery, and right coronary artery.  Following contrast enhancement, the thoracic aorta opacifies well with contrast. Negative for aortic dissection or aneurysm. There is slight focal ectasia of the proximal descending thoracic aorta without aneurysm. Maximum AP diameter of the proximal descending thoracic aorta is 2.7 cm.  Proximal great vessels opacify normally and are normal in caliber.  Heart size is within normal limits. Coronary artery atherosclerosis appears  There is a small hiatal hernia.  Negative for pleural or pericardial effusion.  The lungs are fairly well expanded, with some typical dependent atelectasis in both lower lobes. Negative for airspace disease. A few tiny peripheral pulmonary nodules  are seen in the right upper lobe. Example 3 mm peripheral nodule right upper lobe on image number 33. A 2 mm peripheral pulmonary nodule right upper lobe on image number 38. There are a few additional 1-2 mm peripheral pulmonary nodules in the right upper lobe.  Thoracic spine vertebral bodies are normal in height and alignment. No acute or suspicious bony abnormality is identified.  Review of the MIP images confirms the above findings.  CTA ABDOMEN AND PELVIS FINDINGS  There is moderate atherosclerotic calcification of the normal caliber abdominal aorta wound. Negative for abdominal aortic aneurysm, ectasia, or dissection. No hemodynamically significant narrowing of the abdominal aorta.  Celiac axis is  widely patent. There is atherosclerotic calcification of the origin of superior mesenteric artery, without significant narrowing. Superior mesenteric artery opacifies normally with contrast. There is atherosclerotic calcification just proximal to the origin of the inferior mesenteric artery. The inferior mesenteric artery is widely patent and opacifies with contrast. The renal arteries are patent bilaterally and show no significant atherosclerotic change.  There is mild atherosclerotic change of the normal caliber common iliac arteries bilaterally and the proximal internal iliac arteries. The proximal femoral arteries show no significant atherosclerotic change.  Arterial phase imaging of the liver, spleen, adrenal glands, and kidneys is within normal limits.  There is mild focal mesenteric stranding adjacent to the inferior aspect of the pancreatic head (images 106 through 109 on series 5). The parenchyma of the pancreas enhances normally. No pancreatic mass, calcification, or ductal dilatation identified.  Stomach is decompressed. Small bowel loops are normal in caliber. No small bowel wall thickening is identified.  The appendix is retrocecal and normal.  There is a moderate amount of stool throughout the colon.  There is moderate diverticulosis of the colon, most prominent in the sigmoid region. No definite acute diverticulitis is identified.  Negative for the abdominopelvic ascites or lymphadenopathy. Inguinal regions unremarkable. Tiny umbilical hernia noted. Otherwise, abdominal wall is unremarkable.  Lumbar spine vertebral bodies are normal in height and alignment. No acute or suspicious bony abnormality is seen.  Review of the MIP images confirms the above findings.  IMPRESSION: 1. Negative for thoracoabdominal aortic dissection or aneurysm. Negative for thoracic aorta intramural hematoma barium 2. Mild focal mesenteric stranding adjacent to the pancreatic head. Mild pancreatitis cannot be excluded. Duodenitis can also cause a similar appearance. 3. Atherosclerotic changes of the thoracic aorta with mild focal ectasia of the proximal descending thoracic aorta. 4. Atherosclerotic changes of the abdominal aorta and iliac vasculature, without significant luminal narrowing. The branch vessels of the abdominal aorta are patent. 5. Coronary artery atherosclerotic calcifications. 6. Small hiatal hernia. 7. Several tiny right upper lobe pulmonary nodules. If the patient is at high risk for bronchogenic carcinoma, follow-up chest CT at 1 year is recommended. If the patient is at low risk, no follow-up is needed. This recommendation follows the consensus statement: Guidelines for Management of Small Pulmonary Nodules Detected on CT Scans: A Statement from the [employer] as published in Radiology 2005; 237:395-400.   Electronically Signed   By: Curlene Dolphin M.D.   On: 09/01/2014 15:57     EKG Interpretation None      MDM   Final diagnoses:  Chest pain, unspecified chest pain type  Acute pancreatitis, unspecified pancreatitis type   Patient presents to ED with chest pain with radiation to her back. WBC count 11.7, minimally elevated d dimer, but age adjusted is negative. CT angio was done to rule out aortic  dissection. CT shows focal mesenteric stranding adjacent to the pancreatic head. Likely atypical pancreatitis. Discussed diet restrictions with patient. Will give pain medication for home. PCP follow up. Patient also with several tiny right upper lobe pulmonary nodules. Discussed with patient the need for outpatient follow up. Patient verbalizes understanding. Dr. Colin Rhein evaluated patient and agrees with plan. Discussed reasons to return to ED immediately. Vital signs stable for discharge. Patient / Family / Caregiver informed of clinical course, understand medical decision-making process, and agree with plan.   Elwyn Lade, PA-C 09/02/14 [phone]

## 2014-09-04 ENCOUNTER — Other Ambulatory Visit: Payer: Self-pay | Admitting: Internal Medicine

## 2014-09-04 ENCOUNTER — Ambulatory Visit
Admission: RE | Admit: 2014-09-04 | Discharge: 2014-09-04 | Disposition: A | Discharge status: Home / Self Care | Payer: Medicare Other | Source: Ambulatory Visit | Attending: Internal Medicine | Admitting: Internal Medicine

## 2014-09-04 DIAGNOSIS — K858 Other acute pancreatitis without necrosis or infection: Secondary | ICD-10-CM

## 2014-09-05 NOTE — ED Provider Notes (Signed)
Medical screening examination/treatment/procedure(s) were conducted as a shared visit with non-physician practitioner(s) and myself.  I personally evaluated the patient during the encounter.   EKG Interpretation   Date/Time:  Friday September 01 2014 10:03:49 EDT Ventricular Rate:  115 PR Interval:  164 QRS Duration: 86 QT Interval:  336 QTC Calculation: 464 R Axis:   23 Text Interpretation:  Sinus tachycardia Cannot rule out Inferior infarct ,  age undetermined Cannot rule out Anterior infarct , age undetermined  Abnormal ECG ED PHYSICIAN INTERPRETATION AVAILABLE IN [facility] [place]  Confirmed by TEST, Record ([id number]) on 09/03/2014 12:27:45 PM       Briefly, pt is a 68 y.o. female presenting with chest pain with radiation to back.  I performed an examination on the patient including cardiac, pulmonary, and gi systems which were remarkable for epigastric tenderness.  CT scan with pancreatitis, as well as pulmonary nodules.  Pt taking po in department without difficulty and had symptoms controlled.  DC home in stable condition.     Debby Freiberg, MD 09/05/14 [phone]

## 2014-09-06 ENCOUNTER — Other Ambulatory Visit: Payer: Self-pay | Admitting: Internal Medicine

## 2014-09-06 DIAGNOSIS — K859 Acute pancreatitis, unspecified: Secondary | ICD-10-CM

## 2014-11-09 ENCOUNTER — Emergency Department (HOSPITAL_COMMUNITY): Payer: Medicare Other

## 2014-11-09 ENCOUNTER — Emergency Department (HOSPITAL_COMMUNITY)
Admission: EM | Admit: 2014-11-09 | Discharge: 2014-11-09 | Disposition: A | Discharge status: Home / Self Care | Payer: Medicare Other | Attending: Emergency Medicine | Admitting: Emergency Medicine

## 2014-11-09 ENCOUNTER — Encounter (HOSPITAL_COMMUNITY): Payer: Self-pay | Admitting: Emergency Medicine

## 2014-11-09 DIAGNOSIS — M1990 Unspecified osteoarthritis, unspecified site: Secondary | ICD-10-CM | POA: Insufficient documentation

## 2014-11-09 DIAGNOSIS — E785 Hyperlipidemia, unspecified: Secondary | ICD-10-CM | POA: Insufficient documentation

## 2014-11-09 DIAGNOSIS — I10 Essential (primary) hypertension: Secondary | ICD-10-CM | POA: Insufficient documentation

## 2014-11-09 DIAGNOSIS — Z8669 Personal history of other diseases of the nervous system and sense organs: Secondary | ICD-10-CM | POA: Insufficient documentation

## 2014-11-09 DIAGNOSIS — R51 Headache: Secondary | ICD-10-CM | POA: Insufficient documentation

## 2014-11-09 DIAGNOSIS — R110 Nausea: Secondary | ICD-10-CM | POA: Diagnosis not present

## 2014-11-09 DIAGNOSIS — Z79899 Other long term (current) drug therapy: Secondary | ICD-10-CM | POA: Insufficient documentation

## 2014-11-09 DIAGNOSIS — K219 Gastro-esophageal reflux disease without esophagitis: Secondary | ICD-10-CM | POA: Diagnosis not present

## 2014-11-09 DIAGNOSIS — R519 Headache, unspecified: Secondary | ICD-10-CM

## 2014-11-09 MED ORDER — OXYCODONE-ACETAMINOPHEN 5-325 MG PO TABS: 1.0000 | ORAL_TABLET | Freq: Four times a day (QID) | ORAL | Status: DC | PRN

## 2014-11-09 MED ORDER — DIPHENHYDRAMINE HCL 50 MG/ML IJ SOLN
25.0000 mg | INTRAMUSCULAR | Status: AC
  Administered 2014-11-09: 25 mg via INTRAVENOUS
  Filled 2014-11-09: qty 1

## 2014-11-09 MED ORDER — METOCLOPRAMIDE HCL 5 MG/ML IJ SOLN
10.0000 mg | Freq: Once | INTRAMUSCULAR | Status: AC
Start: 2014-11-09 — End: 2014-11-09
  Administered 2014-11-09: 10 mg via INTRAVENOUS
  Filled 2014-11-09: qty 2

## 2014-11-09 MED ORDER — MORPHINE SULFATE 2 MG/ML IJ SOLN
2.0000 mg | Freq: Once | INTRAMUSCULAR | Status: AC
  Administered 2014-11-09: 2 mg via INTRAVENOUS
  Filled 2014-11-09: qty 1

## 2014-11-09 MED ORDER — ONDANSETRON HCL 4 MG PO TABS: 4.0000 mg | ORAL_TABLET | Freq: Four times a day (QID) | ORAL | Status: DC

## 2014-11-09 MED ORDER — OXYCODONE-ACETAMINOPHEN 5-325 MG PO TABS
1.0000 | ORAL_TABLET | Freq: Once | ORAL | Status: AC
  Administered 2014-11-09: 1 via ORAL
  Filled 2014-11-09: qty 1

## 2014-11-09 MED ORDER — KETOROLAC TROMETHAMINE 30 MG/ML IJ SOLN
30.0000 mg | Freq: Once | INTRAMUSCULAR | Status: AC
  Administered 2014-11-09: 30 mg via INTRAVENOUS
  Filled 2014-11-09: qty 1

## 2014-11-09 MED ORDER — SODIUM CHLORIDE 0.9 % IV BOLUS (SEPSIS)
1000.0000 mL | Freq: Once | INTRAVENOUS | Status: AC
  Administered 2014-11-09: 1000 mL via INTRAVENOUS

## 2014-11-09 NOTE — ED Notes (Signed)
Pt calls out stating her ride is on the way and for [place] to get her discharge papers ready. Spoke with Vernie Shanks will arrange disposition.

## 2014-11-09 NOTE — ED Provider Notes (Signed)
Patient seen by me. Patient with a history of a severe headache posteriorly back of the head since Tuesday. Did start gradually. Associated with some nausea. Been getting progressively worse. Patient has a history of headaches but not usually this bad. Did have severe 1 someone of this in May 2014 that led to an MRI which had no significant findings. However patient seems to be in a fair amount of discomfort. Will get head CT just to be on safe side. Patient does not have a history of migraines. But they did think that the headaches that occurred in 2014 may have been migraine light. But no history of migraines at a younger age. If head CT is negative patient can be discharged home. Also treating her with headache cocktail-type meds.   Medical screening examination/treatment/procedure(s) were conducted as a shared visit with non-physician practitioner(s) and myself.  I personally evaluated the patient during the encounter.   EKG Interpretation None        Fredia Sorrow, MD 11/09/14 [phone]

## 2014-11-09 NOTE — ED Provider Notes (Signed)
CSN: [id number]     Arrival date & time 11/09/14  7106 History   First MD Initiated Contact with Patient 11/09/14 [phone]     Chief Complaint  Patient presents with  . Headache   (Consider location/radiation/quality/duration/timing/severity/associated sxs/prior Treatment) [name] is a 68 yo female presenting with gradual onset headache x 2 days.  She reports the headache came on slowly but is very strong now.  She describes the headache as a squeezing of her entire head, as if "it's in a vice".  She has had similar headaches in the past and had MRI imaging in May 2014.  She reports some nausea but denies any fever, chills, nek rigidity, vomiting, blurred vision, altered sensation, weakness or focal deficit.    Past Medical History  Diagnosis Date  . Arthritis   . GERD (gastroesophageal reflux disease)   . Hyperlipidemia   . Hypertension   . Neck pain   . Snores   . Wears glasses   . Peripheral neuropathy    Past Surgical History  Procedure Laterality Date  . Joint replacement  2001    Left Knee Replacement  . Abdominal hysterectomy  1970    Total HYST  . Wisdom tooth extraction    . Colonoscopy    . Knee arthroscopy      lt   Family History  Problem Relation Age of Onset  . Cancer Mother   . Hypertension Mother   . Hypertension Father   . Heart disease Father   . Cancer Sister     Brain Ca  . Cancer Brother     Prostate Ca   History  Substance Use Topics  . Smoking status: Never Smoker   . Smokeless tobacco: Never Used  . Alcohol Use: No   OB History    No data available     Review of Systems  Constitutional: Negative for fever and chills.  HENT: Negative for sore throat.   Eyes: Negative for visual disturbance.  Respiratory: Negative for cough and shortness of breath.   Cardiovascular: Negative for chest pain and leg swelling.  Gastrointestinal: Positive for nausea. Negative for vomiting and diarrhea.  Genitourinary: Negative for dysuria.   Musculoskeletal: Negative for myalgias.  Skin: Negative for rash.  Neurological: Positive for headaches. Negative for weakness and numbness.    Allergies  Review of patient's allergies indicates no known allergies.  Home Medications   Prior to Admission medications   Medication Sig Start Date End Date Taking? Authorizing Provider  benazepril (LOTENSIN) 40 MG tablet Take 40 mg by mouth daily.  06/01/12  Yes Historical Provider, MD  benzonatate (TESSALON) 100 MG capsule Take 1 capsule by mouth every 8 (eight) hours as needed. cough 10/30/14  Yes Historical Provider, MD  Calcium Carbonate-Vit D-Min (CALCIUM 1200 PO) Take 1 tablet by mouth 2 (two) times daily.    Yes Historical Provider, MD  cetirizine (ZYRTEC) 10 MG tablet Take 10 mg by mouth daily.   Yes Historical Provider, MD  citalopram (CELEXA) 20 MG tablet Take 20 mg by mouth daily.  04/23/12  Yes Historical Provider, MD  estradiol (ESTRACE) 2 MG tablet Take 2 mg by mouth daily.  06/01/12  Yes Historical Provider, MD  furosemide (LASIX) 40 MG tablet Take 40 mg by mouth daily.   Yes Historical Provider, MD  LORazepam (ATIVAN) 0.5 MG tablet Take 1 tablet by mouth 2 (two) times daily as needed. anxiety 10/30/14  Yes Historical Provider, MD  naproxen (NAPROSYN) 500 MG tablet  Take 1 tablet by mouth 2 (two) times daily as needed. headache 10/09/14  Yes Historical Provider, MD  nortriptyline (PAMELOR) 50 MG capsule Take 1 capsule by mouth at bedtime. 10/09/14  Yes Historical Provider, MD  oxyCODONE-acetaminophen (PERCOCET/ROXICET) 5-325 MG per tablet Take 1-2 tablets by mouth every 6 (six) hours as needed for severe pain. May take 2 tablets PO q 6 hours for severe pain - Do not take with Tylenol as this tablet already contains tylenol 09/01/14  Yes Kara Mead Merrell, PA-C  pantoprazole (PROTONIX) 40 MG tablet Take 40 mg by mouth daily.   Yes Historical Provider, MD  simvastatin (ZOCOR) 80 MG tablet Take 80 mg by mouth every evening.  05/31/12  Yes  Historical Provider, MD  vitamin C (ASCORBIC ACID) 500 MG tablet Take 500 mg by mouth daily.   Yes Historical Provider, MD  naproxen (EC NAPROSYN) 500 MG EC tablet Take 1 tablet (500 mg total) by mouth as needed (Max 2 tablets in 24 hours.  Please only take IF needed). Patient not taking: Reported on 11/09/2014 03/20/14   Marcial Pacas, MD  ondansetron (ZOFRAN) 4 MG tablet Take 1 tablet (4 mg total) by mouth every 6 (six) hours. Patient not taking: Reported on 11/09/2014 09/01/14   Kara Mead Merrell, PA-C   BP 153/65 mmHg  Pulse 74  Temp(Src) 97.8 F (36.6 C) (Oral)  Resp 12  Ht 5\' 5"  (1.651 m)  Wt 180 lb (81.647 kg)  BMI 29.95 kg/m2  SpO2 98% Physical Exam  Constitutional: She is oriented to person, place, and time. She appears well-developed and well-nourished. No distress.  HENT:  Head: Normocephalic and atraumatic.  Mouth/Throat: Oropharynx is clear and moist. No oropharyngeal exudate.  Eyes: Conjunctivae are normal.  Neck: Neck supple. No thyromegaly present.  Cardiovascular: Normal rate, regular rhythm and intact distal pulses.   Pulmonary/Chest: Effort normal and breath sounds normal. No respiratory distress. She has no wheezes. She has no rales. She exhibits no tenderness.  Abdominal: Soft. There is no tenderness.  Musculoskeletal: She exhibits no tenderness.  Lymphadenopathy:    She has no cervical adenopathy.  Neurological: She is alert and oriented to person, place, and time. She has normal strength. No cranial nerve deficit or sensory deficit. Coordination normal. GCS eye subscore is 4. GCS verbal subscore is 5. GCS motor subscore is 6.  Cranial nerves 2-12 intact  Skin: Skin is warm and dry. No rash noted. She is not diaphoretic.  Psychiatric: She has a normal mood and affect.  Nursing note and vitals reviewed.   ED Course  Procedures (including critical care time) Labs Review Labs Reviewed - No data to display  Imaging Review No results found.   EKG  Interpretation None      MDM   Final diagnoses:  None   68 yo with HA and similar presentation to previous headaches. Her presentation and history is no t concerning for [facility], ICH, Meningitis, or temporal arteritis, however she has an MRI in 2014 that include differential of microvascular ischemia vs migraine.  Discussed with Dr. Wallie Char will obtain head CT and treat with NS bolus, toradol, reglan and benadryl.   At end of shift, hand-off report given to Margarita Mail, PA-C.  Plan includes review CT and discharge once headache improved.       Filed Vitals:   11/09/14 0400 11/09/14 0415 11/09/14 0430 11/09/14 0445  BP: 155/64 169/68 149/71 153/65  Pulse: 76 79 82 74  Temp:      TempSrc:  Resp: 15 13 14 12   Height:      Weight:      SpO2: 97% 97% 97% 98%   Meds given in ED:  Medications  sodium chloride 0.9 % bolus 1,000 mL (not administered)  ketorolac (TORADOL) 30 MG/ML injection 30 mg (not administered)  metoCLOPramide (REGLAN) injection 10 mg (not administered)  diphenhydrAMINE (BENADRYL) injection 25 mg (not administered)  oxyCODONE-acetaminophen (PERCOCET/ROXICET) 5-325 MG per tablet 1 tablet (1 tablet Oral Given 11/09/14 0434)    New Prescriptions   No medications on file       Britt Bottom, NP 11/09/14 [place], MD 11/15/14 [phone]

## 2014-11-09 NOTE — ED Notes (Signed)
Pt given breakfast tray to eat. Pt states her headache is starting to come back and is requesting more pain medication. Will speak with provider regarding this.

## 2014-11-09 NOTE — ED Notes (Signed)
Pt states does not have anyone to pick her up until somebody gets off work-- will call friend.

## 2014-11-09 NOTE — ED Notes (Signed)
Pt waiting for ride back home, states her son is the only one who can take her and he is at work. Will be off later this evening. Will speak with case management when they arrive to possibly arrange transport.

## 2014-11-09 NOTE — ED Provider Notes (Signed)
Patient  Taken in sign out from [place]   Patient with severe HA. Normal neuro deficits. Terrible headache like a vice grip x 3 days.  MRI in 2014  With a differential of microvascular ischemia to complicated migraine. Migraine cocktail and awaiting CT head.   Patient CT negative. Pain improved  Appears safe for discharge. Patient does not have a ride home. She will be moved to pod C and social work will consult .   11:00 Am patient Ride arriving. Social work consult completed. Pt HA treated and improved while in ED.  Presentation is like pts typical HA and non concerning for [facility], ICH, Meningitis, or temporal arteritis. Pt is afebrile with no focal neuro deficits, nuchal rigidity, or change in vision. Pt is to follow up with PCP to discuss prophylactic medication. Pt verbalizes understanding and is agreeable with plan to dc.    Margarita Mail, PA-C 11/14/14 [phone]

## 2014-11-09 NOTE — Discharge Instructions (Signed)
Please follow the directions provided.  Be sure to follow-up with your primary care provider to ensure you are getting better. You may want to discuss preventive medicines to reduce the likelihood of these headaches.  Don't hesitate to return for any new, worsening or concerning symptoms.    SEEK IMMEDIATE MEDICAL CARE IF:  Your migraine becomes severe.  You have a fever.  You have a stiff neck.  You have vision loss.  You have muscular weakness or loss of muscle control.  You start losing your balance or have trouble walking.  You feel faint or pass out.  You have severe symptoms that are different from your first symptoms.

## 2014-11-09 NOTE — Progress Notes (Signed)
CSW Surveyor, quantity met with Pamela Hendricks after receiving referral to assist with transportation. Pamela Hendricks has identified a friend who will come get her in the next couple of hours. She thanked [place] for our time and concern. Nursing is encouraged to contact CSW department again if needed in the future.   Elana Alm, Environmental manager Social Work [employer] [phone]

## 2014-11-09 NOTE — ED Notes (Signed)
Pt arrives with c/o headache since Tuesday, states its gotten worse. Similar to previous hx of headaches. States it feels like her head is a vice. Attempted to use naproxen with no relief. A/ox4.

## 2014-11-09 NOTE — ED Notes (Signed)
Report given to Farley, RN in [place] [facility]-- pt will be moved to [facility]

## 2014-11-11 ENCOUNTER — Emergency Department (HOSPITAL_COMMUNITY)
Admission: EM | Admit: 2014-11-11 | Discharge: 2014-11-11 | Disposition: A | Discharge status: Home / Self Care | Payer: Medicare Other | Attending: Emergency Medicine | Admitting: Emergency Medicine

## 2014-11-11 ENCOUNTER — Encounter (HOSPITAL_COMMUNITY): Payer: Self-pay | Admitting: *Deleted

## 2014-11-11 DIAGNOSIS — Z8669 Personal history of other diseases of the nervous system and sense organs: Secondary | ICD-10-CM | POA: Diagnosis not present

## 2014-11-11 DIAGNOSIS — R51 Headache: Secondary | ICD-10-CM | POA: Diagnosis present

## 2014-11-11 DIAGNOSIS — K219 Gastro-esophageal reflux disease without esophagitis: Secondary | ICD-10-CM | POA: Insufficient documentation

## 2014-11-11 DIAGNOSIS — M1990 Unspecified osteoarthritis, unspecified site: Secondary | ICD-10-CM | POA: Insufficient documentation

## 2014-11-11 DIAGNOSIS — R110 Nausea: Secondary | ICD-10-CM | POA: Diagnosis not present

## 2014-11-11 DIAGNOSIS — Z973 Presence of spectacles and contact lenses: Secondary | ICD-10-CM | POA: Insufficient documentation

## 2014-11-11 DIAGNOSIS — I10 Essential (primary) hypertension: Secondary | ICD-10-CM | POA: Insufficient documentation

## 2014-11-11 DIAGNOSIS — H53149 Visual discomfort, unspecified: Secondary | ICD-10-CM | POA: Insufficient documentation

## 2014-11-11 DIAGNOSIS — J323 Chronic sphenoidal sinusitis: Secondary | ICD-10-CM | POA: Insufficient documentation

## 2014-11-11 DIAGNOSIS — G44229 Chronic tension-type headache, not intractable: Secondary | ICD-10-CM | POA: Diagnosis not present

## 2014-11-11 DIAGNOSIS — Z79899 Other long term (current) drug therapy: Secondary | ICD-10-CM | POA: Diagnosis not present

## 2014-11-11 DIAGNOSIS — E785 Hyperlipidemia, unspecified: Secondary | ICD-10-CM | POA: Diagnosis not present

## 2014-11-11 MED ORDER — DIPHENHYDRAMINE HCL 50 MG/ML IJ SOLN
12.5000 mg | Freq: Once | INTRAMUSCULAR | Status: AC
  Administered 2014-11-11: 12.5 mg via INTRAVENOUS
  Filled 2014-11-11: qty 1

## 2014-11-11 MED ORDER — SODIUM CHLORIDE 0.9 % IV BOLUS (SEPSIS)
1000.0000 mL | Freq: Once | INTRAVENOUS | Status: AC
  Administered 2014-11-11: 1000 mL via INTRAVENOUS

## 2014-11-11 MED ORDER — DIPHENHYDRAMINE HCL 25 MG PO TABS: 25.0000 mg | ORAL_TABLET | Freq: Four times a day (QID) | ORAL | Status: DC | PRN

## 2014-11-11 MED ORDER — AMOXICILLIN 500 MG PO TABS: 500.0000 mg | ORAL_TABLET | Freq: Two times a day (BID) | ORAL | Status: DC

## 2014-11-11 MED ORDER — METOCLOPRAMIDE HCL 10 MG PO TABS: 10.0000 mg | ORAL_TABLET | Freq: Four times a day (QID) | ORAL | Status: DC | PRN

## 2014-11-11 MED ORDER — PROCHLORPERAZINE EDISYLATE 5 MG/ML IJ SOLN
10.0000 mg | Freq: Once | INTRAMUSCULAR | Status: AC
  Administered 2014-11-11: 10 mg via INTRAVENOUS
  Filled 2014-11-11: qty 2

## 2014-11-11 MED ORDER — NAPROXEN 500 MG PO TABS: 500.0000 mg | ORAL_TABLET | Freq: Two times a day (BID) | ORAL | Status: DC | PRN

## 2014-11-11 MED ORDER — KETOROLAC TROMETHAMINE 30 MG/ML IJ SOLN
30.0000 mg | Freq: Once | INTRAMUSCULAR | Status: AC
  Administered 2014-11-11: 30 mg via INTRAVENOUS
  Filled 2014-11-11: qty 1

## 2014-11-11 MED ORDER — DEXAMETHASONE SODIUM PHOSPHATE 10 MG/ML IJ SOLN
10.0000 mg | Freq: Once | INTRAMUSCULAR | Status: AC
  Administered 2014-11-11: 10 mg via INTRAVENOUS
  Filled 2014-11-11: qty 1

## 2014-11-11 NOTE — ED Provider Notes (Signed)
CSN: [id number]     Arrival date & time 11/11/14  1018 History   First MD Initiated Contact with Patient 11/11/14 1027     Chief Complaint  Patient presents with  . Headache     (Consider location/radiation/quality/duration/timing/severity/associated sxs/prior Treatment) HPI Comments: Pamela Hendricks is a 69 y.o. female with a PMHx of arthritis, GERD, HLD, HTN, peripheral neuropathy, and chronic headaches/migraines, who presents to the ED with complaints of ongoing headache 4 days. She was seen in the ER 2 days ago, had a CT which was negative, and was given a migraine cocktail with no relief. She returns today due to the ongoing nature of her headache. She reports that the headache was gradual onset, no thunderclap onset or worst headache of her life, states this is consistent with headaches that she began having in May 2014. The headache is 10/10 generalized pressure-like which radiates into her neck, constant, worse with coughing and lights, and unimproved with Percocet or Zofran given to her at discharge 2 days ago. She endorses green rhinorrhea and postnasal drip along with watery eyes and nausea associated with her headaches. She states she has chronic sinusitis, and in August 2014 when she saw her neurologist Dr. Krista Blue, an MRI was obtained and showed sinusitis, for which she was given amoxicillin. She states she is supposed be taking nortriptyline, and that she tried to this this morning with no relief, but had not been taking it regularly. She has not seen Dr. Krista Blue in several months. She denies fevers, chills, chest pain, shortness of breath, ear pain or drainage, vision changes, dizziness, lightheadedness, and stable gait, numbness, vertebral, weakness, abdominal pain, vomiting, diarrhea, constipation, dysuria, hematuria, or rashes. Denies focal neuro deficits. This HA is similar to prior HAs.   Patient is a 69 y.o. female presenting with migraines. The history is provided by the patient. No  language interpreter was used.  Migraine This is a recurrent problem. The current episode started in the past 7 days. The problem occurs constantly. The problem has been unchanged. Associated symptoms include congestion (nasal), headaches and nausea. Pertinent negatives include no abdominal pain, arthralgias, chest pain, chills, coughing, fever, myalgias, neck pain, numbness, rash, sore throat, swollen glands, vertigo, visual change, vomiting or weakness. The symptoms are aggravated by coughing. She has tried oral narcotics for the symptoms. The treatment provided no relief.    Past Medical History  Diagnosis Date  . Arthritis   . GERD (gastroesophageal reflux disease)   . Hyperlipidemia   . Hypertension   . Neck pain   . Snores   . Wears glasses   . Peripheral neuropathy    Past Surgical History  Procedure Laterality Date  . Joint replacement  2001    Left Knee Replacement  . Abdominal hysterectomy  1970    Total HYST  . Wisdom tooth extraction    . Colonoscopy    . Knee arthroscopy      lt   Family History  Problem Relation Age of Onset  . Cancer Mother   . Hypertension Mother   . Hypertension Father   . Heart disease Father   . Cancer Sister     Brain Ca  . Cancer Brother     Prostate Ca   History  Substance Use Topics  . Smoking status: Never Smoker   . Smokeless tobacco: Never Used  . Alcohol Use: No   OB History    No data available     Review of Systems  Constitutional:  Negative for fever and chills.  HENT: Positive for congestion (nasal), postnasal drip, rhinorrhea (green) and sinus pressure. Negative for ear discharge, ear pain, sore throat and trouble swallowing.   Eyes: Positive for photophobia and discharge (watery). Negative for pain and visual disturbance.  Respiratory: Negative for cough and shortness of breath.   Cardiovascular: Negative for chest pain.  Gastrointestinal: Positive for nausea. Negative for vomiting, abdominal pain, diarrhea and  constipation.  Genitourinary: Negative for dysuria and hematuria.  Musculoskeletal: Negative for myalgias, back pain, arthralgias, gait problem, neck pain and neck stiffness.  Skin: Negative for rash.  Neurological: Positive for headaches. Negative for dizziness, vertigo, tremors, syncope, weakness, light-headedness and numbness.  Psychiatric/Behavioral: Negative for confusion.   10 Systems reviewed and are negative for acute change except as noted in the HPI.    Allergies  Review of patient's allergies indicates no known allergies.  Home Medications   Prior to Admission medications   Medication Sig Start Date End Date Taking? Authorizing Provider  benazepril (LOTENSIN) 40 MG tablet Take 40 mg by mouth daily.  06/01/12   Historical Provider, MD  benzonatate (TESSALON) 100 MG capsule Take 1 capsule by mouth every 8 (eight) hours as needed. cough 10/30/14   Historical Provider, MD  Calcium Carbonate-Vit D-Min (CALCIUM 1200 PO) Take 1 tablet by mouth 2 (two) times daily.     Historical Provider, MD  cetirizine (ZYRTEC) 10 MG tablet Take 10 mg by mouth daily.    Historical Provider, MD  citalopram (CELEXA) 20 MG tablet Take 20 mg by mouth daily.  04/23/12   Historical Provider, MD  estradiol (ESTRACE) 2 MG tablet Take 2 mg by mouth daily.  06/01/12   Historical Provider, MD  furosemide (LASIX) 40 MG tablet Take 40 mg by mouth daily.    Historical Provider, MD  LORazepam (ATIVAN) 0.5 MG tablet Take 1 tablet by mouth 2 (two) times daily as needed. anxiety 10/30/14   Historical Provider, MD  naproxen (EC NAPROSYN) 500 MG EC tablet Take 1 tablet (500 mg total) by mouth as needed (Max 2 tablets in 24 hours.  Please only take IF needed). Patient not taking: Reported on 11/09/2014 03/20/14   Marcial Pacas, MD  naproxen (NAPROSYN) 500 MG tablet Take 1 tablet by mouth 2 (two) times daily as needed. headache 10/09/14   Historical Provider, MD  nortriptyline (PAMELOR) 50 MG capsule Take 1 capsule by mouth at  bedtime. 10/09/14   Historical Provider, MD  ondansetron (ZOFRAN) 4 MG tablet Take 1 tablet (4 mg total) by mouth every 6 (six) hours. 11/09/14   Margarita Mail, PA-C  oxyCODONE-acetaminophen (PERCOCET/ROXICET) 5-325 MG per tablet Take 1-2 tablets by mouth every 6 (six) hours as needed for severe pain. May take 2 tablets PO q 6 hours for severe pain - Do not take with Tylenol as this tablet already contains tylenol 11/09/14   Margarita Mail, PA-C  pantoprazole (PROTONIX) 40 MG tablet Take 40 mg by mouth daily.    Historical Provider, MD  simvastatin (ZOCOR) 80 MG tablet Take 80 mg by mouth every evening.  05/31/12   Historical Provider, MD  vitamin C (ASCORBIC ACID) 500 MG tablet Take 500 mg by mouth daily.    Historical Provider, MD   BP 159/62 mmHg  Pulse 75  Temp(Src) 97.8 F (36.6 C) (Oral)  Resp 20  SpO2 97% Physical Exam  Constitutional: She is oriented to person, place, and time. Vital signs are normal. She appears well-developed and well-nourished.  Non-toxic appearance. No distress.  Afebrile, nontoxic, well-appearing.  HENT:  Head: Normocephalic and atraumatic.  Nose: Mucosal edema, rhinorrhea and sinus tenderness present. Right sinus exhibits maxillary sinus tenderness and frontal sinus tenderness. Left sinus exhibits maxillary sinus tenderness and frontal sinus tenderness.  Mouth/Throat: Uvula is midline, oropharynx is clear and moist and mucous membranes are normal. No trismus in the jaw.  Bilateral nasal turbinate edema and erythema with yellowish rhinorrhea, nasal tenderness and bilateral frontal and maxillary sinus tenderness, oropharynx clear moist, moist mucous membranes  Eyes: Conjunctivae and EOM are normal. Pupils are equal, round, and reactive to light. Right eye exhibits no discharge. Left eye exhibits no discharge.  PERRL, extraocular motions intact, conjunctiva clear with no ocular discharge  Neck: Normal range of motion. Neck supple. No spinous process tenderness and  no muscular tenderness present. No rigidity. Normal range of motion present. No Brudzinski's sign and no Kernig's sign noted.  FROM intact without spinous process or paraspinous muscle TTP, no bony stepoffs or deformities, no muscle spasms. No rigidity or meningeal signs. No bruising or swelling.   Cardiovascular: Normal rate, regular rhythm, normal heart sounds and intact distal pulses.  Exam reveals no gallop and no friction rub.   No murmur heard. Pulmonary/Chest: Effort normal and breath sounds normal. No respiratory distress. She has no decreased breath sounds. She has no wheezes. She has no rhonchi. She has no rales.  Abdominal: Soft. Normal appearance and bowel sounds are normal. She exhibits no distension. There is no tenderness. There is no rigidity, no rebound and no guarding.  Musculoskeletal: Normal range of motion.  MAE x4 Gait steady Strength 5/5 in all extremities Sensation grossly intact in all extremities  Lymphadenopathy:       Head (right side): No submandibular and no tonsillar adenopathy present.       Head (left side): No submandibular and no tonsillar adenopathy present.    She has no cervical adenopathy.  No head/neck LAD  Neurological: She is alert and oriented to person, place, and time. She has normal strength and normal reflexes. No cranial nerve deficit or sensory deficit. She displays a negative Romberg sign. Coordination and gait normal. GCS eye subscore is 4. GCS verbal subscore is 5. GCS motor subscore is 6.  CN 2-12 grossly intact A&O x4 GCS 15 DTRs equal bilaterally Sensation and strength intact Gait nonataxic including with tandem walking Coordination with finger-to-nose WNL Neg romberg, neg pronator drift   Skin: Skin is warm, dry and intact. No rash noted.  Psychiatric: She has a normal mood and affect.  Nursing note and vitals reviewed.   ED Course  Procedures (including critical care time) Labs Review Labs Reviewed - No data to  display  Imaging Review No results found. Ct Head Wo Contrast  11/09/2014   CLINICAL DATA:  Headache and pressure.  EXAM: CT HEAD WITHOUT CONTRAST  TECHNIQUE: Contiguous axial images were obtained from the base of the skull through the vertex without intravenous contrast.  COMPARISON:  03/19/2013 brain MRI.  FINDINGS: Skull and Sinuses:There is chronic complete opacification of the right sphenoid sinus by partially mineralized material. CT and MR appearance is most likely from inspissated secretions, fungal colonization could be superimposed.  Orbits: No acute abnormality.  Brain: No evidence of acute infarction, hemorrhage, hydrocephalus, or mass lesion/mass effect.  There is a dilated perivascular space near the lower left sylvian fissure.  Cerebral volume is lower than expected for age. White matter changes noted on the previous brain MRI are not as clearly visualized by CT.  IMPRESSION: 1. No acute intracranial findings. 2. Chronic right sphenoid sinusitis.   Electronically Signed   By: Jorje Guild M.D.   On: 11/09/2014 06:15      EKG Interpretation None      MDM   Final diagnoses:  Chronic tension-type headache, not intractable  Nausea  Chronic sphenoidal sinusitis    69 y.o. female with gradual onset HA similar to prior headaches. CT 2 days ago showing chronic sinusitis. Will give amoxicillin x10 days at pt request. Discussed giving alternate migraine cocktail today in an attempt to improve her acute on chronic headaches, but ultimately she will need to f/up with her neurologist. Doubt SAH, meningitis, ICH, or other emergent secondary headache etiology. Will reassess shortly.  12:28 PM Pt feels improved. Dr. Darl Householder saw pt and agrees with plan of giving amoxicillin for sinusitis, giving naprosyn and reglan/benadryl for headaches, and f/up with her neurologist in 1wk. I explained the diagnosis and have given explicit precautions to return to the ER including for any other new or  worsening symptoms. The patient understands and accepts the medical plan as it's been dictated and I have answered their questions. Discharge instructions concerning home care and prescriptions have been given. The patient is STABLE and is discharged to home in good condition.  BP 118/44 mmHg  Pulse 85  Temp(Src) 97.8 F (36.6 C) (Oral)  Resp 18  SpO2 100%  Meds ordered this encounter  Medications  . prochlorperazine (COMPAZINE) injection 10 mg    Sig:   . diphenhydrAMINE (BENADRYL) injection 12.5 mg    Sig:    And  . sodium chloride 0.9 % bolus 1,000 mL    Sig:    And  . ketorolac (TORADOL) 30 MG/ML injection 30 mg    Sig:   . dexamethasone (DECADRON) injection 10 mg    Sig:   . naproxen (NAPROSYN) 500 MG tablet    Sig: Take 1 tablet (500 mg total) by mouth 2 (two) times daily as needed for mild pain, moderate pain or headache (TAKE WITH MEALS.).    Dispense:  20 tablet    Refill:  0    Order Specific Question:  Supervising Provider    Answer:  Noemi Chapel D [[id number]]  . metoCLOPramide (REGLAN) 10 MG tablet    Sig: Take 1 tablet (10 mg total) by mouth every 6 (six) hours as needed for nausea (nausea/headache).    Dispense:  6 tablet    Refill:  0    Order Specific Question:  Supervising Provider    Answer:  Noemi Chapel D [[id number]]  . amoxicillin (AMOXIL) 500 MG tablet    Sig: Take 1 tablet (500 mg total) by mouth 2 (two) times daily. X 10 days    Dispense:  20 tablet    Refill:  0    Order Specific Question:  Supervising Provider    Answer:  Noemi Chapel D [[id number]]  . diphenhydrAMINE (BENADRYL) 25 MG tablet    Sig: Take 1 tablet (25 mg total) by mouth every 6 (six) hours as needed (headache).    Dispense:  20 tablet    Refill:  0    Order Specific Question:  Supervising Provider    Answer:  Johnna Acosta [address] [place] Ahart Willcox, PA-C 11/11/14 Williamson Yao, MD 11/12/14 [phone]

## 2014-11-11 NOTE — ED Notes (Signed)
Pt ambulates with steady gait.

## 2014-11-11 NOTE — ED Notes (Signed)
Pt reports having headache, "thinks her sinuses are blocked up and thinks she needs an MRI." pt was seen here on wed for same and given morphine and migraine cocktail but reports no relief and having nausea.

## 2014-11-11 NOTE — Discharge Instructions (Signed)
Take your usual home medications as directed, especially your home Pamelor (nortriptyline) every night as it's prescribed to help prevent headaches. Use naprosyn as directed for headaches, only as needed. Use reglan with benadryl as needed for headaches and nausea. Stay well hydrated, get plenty of rest. See your neurologist in 1 week for recheck of ongoing headaches. Use amoxicillin as directed for your sinusitis. Return to the ER for changes or worsening symptoms.   Sinusitis Sinusitis is redness, soreness, and inflammation of the paranasal sinuses. Paranasal sinuses are air pockets within the bones of your face (beneath the eyes, the middle of the forehead, or above the eyes). In healthy paranasal sinuses, mucus is able to drain out, and air is able to circulate through them by way of your nose. However, when your paranasal sinuses are inflamed, mucus and air can become trapped. This can allow bacteria and other germs to grow and cause infection. Sinusitis can develop quickly and last only a short time (acute) or continue over a long period (chronic). Sinusitis that lasts for more than 12 weeks is considered chronic.  CAUSES  Causes of sinusitis include:  Allergies.  Structural abnormalities, such as displacement of the cartilage that separates your nostrils (deviated septum), which can decrease the air flow through your nose and sinuses and affect sinus drainage.  Functional abnormalities, such as when the small hairs (cilia) that line your sinuses and help remove mucus do not work properly or are not present. SIGNS AND SYMPTOMS  Symptoms of acute and chronic sinusitis are the same. The primary symptoms are pain and pressure around the affected sinuses. Other symptoms include:  Upper toothache.  Earache.  Headache.  Bad breath.  Decreased sense of smell and taste.  A cough, which worsens when you are lying flat.  Fatigue.  Fever.  Thick drainage from your nose, which often is  green and may contain pus (purulent).  Swelling and warmth over the affected sinuses. DIAGNOSIS  Your health care provider will perform a physical exam. During the exam, your health care provider may:  Look in your nose for signs of abnormal growths in your nostrils (nasal polyps).  Tap over the affected sinus to check for signs of infection.  View the inside of your sinuses (endoscopy) using an imaging device that has a light attached (endoscope). If your health care provider suspects that you have chronic sinusitis, one or more of the following tests may be recommended:  Allergy tests.  Nasal culture. A sample of mucus is taken from your nose, sent to a lab, and screened for bacteria.  Nasal cytology. A sample of mucus is taken from your nose and examined by your health care provider to determine if your sinusitis is related to an allergy. TREATMENT  Most cases of acute sinusitis are related to a viral infection and will resolve on their own within 10 days. Sometimes medicines are prescribed to help relieve symptoms (pain medicine, decongestants, nasal steroid sprays, or saline sprays).  However, for sinusitis related to a bacterial infection, your health care provider will prescribe antibiotic medicines. These are medicines that will help kill the bacteria causing the infection.  Rarely, sinusitis is caused by a fungal infection. In theses cases, your health care provider will prescribe antifungal medicine. For some cases of chronic sinusitis, surgery is needed. Generally, these are cases in which sinusitis recurs more than 3 times per year, despite other treatments. HOME CARE INSTRUCTIONS   Drink plenty of water. Water helps thin the mucus so  your sinuses can drain more easily.  Use a humidifier.  Inhale steam 3 to 4 times a day (for example, sit in the bathroom with the shower running).  Apply a warm, moist washcloth to your face 3 to 4 times a day, or as directed by your health  care provider.  Use saline nasal sprays to help moisten and clean your sinuses.  Take medicines only as directed by your health care provider.  If you were prescribed either an antibiotic or antifungal medicine, finish it all even if you start to feel better. SEEK IMMEDIATE MEDICAL CARE IF:  You have increasing pain or severe headaches.  You have nausea, vomiting, or drowsiness.  You have swelling around your face.  You have vision problems.  You have a stiff neck.  You have difficulty breathing. MAKE SURE YOU:   Understand these instructions.  Will watch your condition.  Will get help right away if you are not doing well or get worse. Document Released: 10/27/2005 Document Revised: 03/13/2014 Document Reviewed: 11/11/2011 [facility] Patient Information 2015 [place], [place]. This information is not intended to replace advice given to you by your health care provider. Make sure you discuss any questions you have with your health care provider.  Tension Headache A tension headache is a feeling of pain, pressure, or aching often felt over the front and sides of the head. The pain can be dull or can feel tight (constricting). It is the most common type of headache. Tension headaches are not normally associated with nausea or vomiting and do not get worse with physical activity. Tension headaches can last 30 minutes to several days.  CAUSES  The exact cause is not known, but it may be caused by chemicals and hormones in the brain that lead to pain. Tension headaches often begin after stress, anxiety, or depression. Other triggers may include:  Alcohol.  Caffeine (too much or withdrawal).  Respiratory infections (colds, flu, sinus infections).  Dental problems or teeth clenching.  Fatigue.  Holding your head and neck in one position too long while using a computer. SYMPTOMS   Pressure around the head.   Dull, aching head pain.   Pain felt over the front and sides of the  head.   Tenderness in the muscles of the head, neck, and shoulders. DIAGNOSIS  A tension headache is often diagnosed based on:   Symptoms.   Physical examination.   A CT scan or MRI of your head. These tests may be ordered if symptoms are severe or unusual. TREATMENT  Medicines may be given to help relieve symptoms.  HOME CARE INSTRUCTIONS   Only take over-the-counter or prescription medicines for pain or discomfort as directed by your caregiver.   Lie down in a dark, quiet room when you have a headache.   Keep a journal to find out what may be triggering your headaches. For example, write down:  What you eat and drink.  How much sleep you get.  Any change to your diet or medicines.  Try massage or other relaxation techniques.   Ice packs or heat applied to the head and neck can be used. Use these 3 to 4 times per day for 15 to 20 minutes each time, or as needed.   Limit stress.   Sit up straight, and do not tense your muscles.   Quit smoking if you smoke.  Limit alcohol use.  Decrease the amount of caffeine you drink, or stop drinking caffeine.  Eat and exercise regularly.  Get 7 to 9 hours of sleep, or as recommended by your caregiver.  Avoid excessive use of pain medicine as recurrent headaches can occur.  SEEK MEDICAL CARE IF:   You have problems with the medicines you were prescribed.  Your medicines do not work.  You have a change from the usual headache.  You have nausea or vomiting. SEEK IMMEDIATE MEDICAL CARE IF:   Your headache becomes severe.  You have a fever.  You have a stiff neck.  You have loss of vision.  You have muscular weakness or loss of muscle control.  You lose your balance or have trouble walking.  You feel faint or pass out.  You have severe symptoms that are different from your first symptoms. MAKE SURE YOU:   Understand these instructions.  Will watch your condition.  Will get help right away if you  are not doing well or get worse. Document Released: 10/27/2005 Document Revised: 01/19/2012 Document Reviewed: 10/17/2011 [facility] Patient Information 2015 [place], [place]. This information is not intended to replace advice given to you by your health care provider. Make sure you discuss any questions you have with your health care provider.  Nausea, Adult Nausea is the feeling that you have an upset stomach or have to vomit. Nausea by itself is not likely a serious concern, but it may be an early sign of more serious medical problems. As nausea gets worse, it can lead to vomiting. If vomiting develops, there is the risk of dehydration.  CAUSES   Viral infections.  Food poisoning.  Medicines.  Pregnancy.  Motion sickness.  Migraine headaches.  Emotional distress.  Severe pain from any source.  Alcohol intoxication. HOME CARE INSTRUCTIONS  Get plenty of rest.  Ask your caregiver about specific rehydration instructions.  Eat small amounts of food and sip liquids more often.  Take all medicines as told by your caregiver. SEEK MEDICAL CARE IF:  You have not improved after 2 days, or you get worse.  You have a headache. SEEK IMMEDIATE MEDICAL CARE IF:   You have a fever.  You faint.  You keep vomiting or have blood in your vomit.  You are extremely weak or dehydrated.  You have dark or bloody stools.  You have severe chest or abdominal pain. MAKE SURE YOU:  Understand these instructions.  Will watch your condition.  Will get help right away if you are not doing well or get worse. Document Released: 12/04/2004 Document Revised: 07/21/2012 Document Reviewed: 07/09/2011 [facility] Patient Information 2015 [place], [place]. This information is not intended to replace advice given to you by your health care provider. Make sure you discuss any questions you have with your health care provider.

## 2014-11-22 ENCOUNTER — Other Ambulatory Visit: Payer: Self-pay | Admitting: Neurology

## 2014-11-22 ENCOUNTER — Ambulatory Visit (INDEPENDENT_AMBULATORY_CARE_PROVIDER_SITE_OTHER): Payer: Medicare Other | Admitting: Neurology

## 2014-11-22 ENCOUNTER — Encounter: Payer: Self-pay | Admitting: Neurology

## 2014-11-22 VITALS — BP 143/78 | HR 91 | Ht 65.0 in | Wt 200.0 lb

## 2014-11-22 DIAGNOSIS — G43909 Migraine, unspecified, not intractable, without status migrainosus: Secondary | ICD-10-CM

## 2014-11-22 DIAGNOSIS — G4730 Sleep apnea, unspecified: Secondary | ICD-10-CM

## 2014-11-22 MED ORDER — PROPRANOLOL HCL ER 60 MG PO CP24: 60.0000 mg | ORAL_CAPSULE | Freq: Every day | ORAL | Status: DC

## 2014-11-22 MED ORDER — BUTALBITAL-APAP-CAFFEINE 50-325-40 MG PO TABS: 1.0000 | ORAL_TABLET | Freq: Four times a day (QID) | ORAL | Status: DC | PRN

## 2014-11-22 NOTE — Progress Notes (Signed)
Reason for visit follow up for  headaches  HPI: [name], 69 year old white female returns for followup. She was initially evaluated by Dr. Krista Blue 03/29/2013 for  evaluation of abnormal MRI brain and headaches.   She had past medical history of hyperlipidemia, reported previous history of "sinus headache", her typical headaches are frontal retro-orbital region pressure pain, she also complains of allergy symptoms, such as watering eyes, runny nose, has been treated as sinus headache, taking Claritin every day for many years,.   She began to have different headaches since February 2014, starting at the right occipital region, unbearable, 10 out of 10, lasting for a few hours, there was no light noise sensitivity, no nausea, twice a week, severe,.   She was taken to the emergency room because of one episode severe headaches, had MRI of the brain, which we reviewed together, which has demonstrate mild small vessel disease, no acute lesions, chronic right sphenoid sinusitis, acute left sphenoid sinusitis, She was given prescription of amoxicin and also hydrocodone with Tylenol, which only helps her headaches temporarily,   Since her headache started in February, she has daily headaches, pressure, is also exacerbated to a much more severe squeezing twisting pressure headaches,  07/04/13: She was placed on Pamelor 50 mg by Dr. Krista Blue as well as Naprosyn 500 mg one tablet twice a day if needed. Her headaches are in better control  UPDATE Nov 22 2014: She is now taking Inderal 50 mg daily, which has helped her headaches some, but she continue complains early morning bandlike sensation across her vertex region, moderate severe, sometimes exacerbated to a much more severe headache with associated light noise sensitivity, occasionally eyes breaking headaches, short lasting,  She presented to emergency room December 30 first, again January second 2015, because acute onset severe holoacranial headaches, repeat CAT  scan continue show chronic right sphenoid sinusitis,  ROS:  Easy bruising, itching, allergies   Medications Current Outpatient Prescriptions on File Prior to Visit  Medication Sig Dispense Refill  . amoxicillin (AMOXIL) 500 MG tablet Take 1 tablet (500 mg total) by mouth 2 (two) times daily. X 10 days 20 tablet 0  . benazepril (LOTENSIN) 40 MG tablet Take 40 mg by mouth daily.     . benzonatate (TESSALON) 100 MG capsule Take 1 capsule by mouth every 8 (eight) hours as needed. cough  0  . Calcium Carbonate-Vit D-Min (CALCIUM 1200 PO) Take 1 tablet by mouth 2 (two) times daily.     . cetirizine (ZYRTEC) 10 MG tablet Take 10 mg by mouth daily.    . citalopram (CELEXA) 20 MG tablet Take 20 mg by mouth daily.     . diphenhydrAMINE (BENADRYL) 25 MG tablet Take 1 tablet (25 mg total) by mouth every 6 (six) hours as needed (headache). 20 tablet 0  . estradiol (ESTRACE) 2 MG tablet Take 2 mg by mouth daily.     . furosemide (LASIX) 40 MG tablet Take 40 mg by mouth daily.    Marland Kitchen LORazepam (ATIVAN) 0.5 MG tablet Take 1 tablet by mouth 2 (two) times daily as needed. anxiety  0  . metoCLOPramide (REGLAN) 10 MG tablet Take 1 tablet (10 mg total) by mouth every 6 (six) hours as needed for nausea (nausea/headache). 6 tablet 0  . naproxen (EC NAPROSYN) 500 MG EC tablet Take 1 tablet (500 mg total) by mouth as needed (Max 2 tablets in 24 hours.  Please only take IF needed). 180 tablet 0  . naproxen (NAPROSYN) 500 MG tablet  Take 1 tablet by mouth 2 (two) times daily as needed. headache  0  . naproxen (NAPROSYN) 500 MG tablet Take 1 tablet (500 mg total) by mouth 2 (two) times daily as needed for mild pain, moderate pain or headache (TAKE WITH MEALS.). 20 tablet 0  . nortriptyline (PAMELOR) 50 MG capsule Take 1 capsule by mouth at bedtime.  0  . ondansetron (ZOFRAN) 4 MG tablet Take 1 tablet (4 mg total) by mouth every 6 (six) hours. 12 tablet 0  . oxyCODONE-acetaminophen (PERCOCET/ROXICET) 5-325 MG per tablet  Take 1-2 tablets by mouth every 6 (six) hours as needed for severe pain. May take 2 tablets PO q 6 hours for severe pain - Do not take with Tylenol as this tablet already contains tylenol 20 tablet 0  . pantoprazole (PROTONIX) 40 MG tablet Take 40 mg by mouth daily.    . simvastatin (ZOCOR) 80 MG tablet Take 80 mg by mouth every evening.     . vitamin C (ASCORBIC ACID) 500 MG tablet Take 500 mg by mouth daily.     No current facility-administered medications on file prior to visit.    Allergies No Known Allergies  Physical Exam General: well developed, well nourished, seated, in no evident distress Head: head normocephalic and atraumatic. Oropharynx benign Neck: supple with no carotid  bruits Cardiovascular: regular rate and rhythm, no murmurs  Neurologic Exam Mental Status: Awake and fully alert. Oriented to place and time. Follows all commands. Speech and language normal.   Cranial Nerves:  Pupils equal, briskly reactive to light. Extraocular movements full without nystagmus. Visual fields full to confrontation. Hearing intact and symmetric to finger snap. Facial sensation intact. Face, tongue, palate move normally and symmetrically. Neck flexion and extension normal.  Motor: Normal bulk and tone. Normal strength in all tested extremity muscles.No focal weakness Sensory.: intact to touch and pinprick and vibratory.  Coordination: Rapid alternating movements normal in all extremities. Finger-to-nose and heel-to-shin performed accurately bilaterally. Gait and Station: Arises from chair without difficulty. Stance is normal. Able to heel, toe and tandem walk without difficulty.  Reflexes: 2+ and symmetric. Toes downgoing.     ASSESSMENT: 69 year old with past medical history of frequent bifrontal headache, MRI of the brain with small vessel disease no acute lesions, she also complains of early morning headaches, excessive daytime sleepiness, fatigue, today's ESS score is 14, FSS score is 22,  suggestive of of obstructive sleep apnea    PLAN: I will refer her for sleep study Laboratory evaluations including ESR C-reactive protein Fioricet as needed for headaches  Orders Placed This Encounter  Procedures  . CBC  . Comprehensive metabolic panel  . TSH  . Sedimentation rate  . C-reactive protein  . Ambulatory referral to Sleep Studies    Return in about 1 month (around 12/23/2014).   Marcial Pacas, M.D. Ph.D.  [facility] [place], [place] [address] Phone: [phone] Fax:      [phone]

## 2014-11-23 ENCOUNTER — Telehealth: Payer: Self-pay | Admitting: Neurology

## 2014-11-23 LAB — COMPREHENSIVE METABOLIC PANEL
ALBUMIN: 4.5 g/dL (ref 3.6–4.8)
ALT: 15 IU/L (ref 0–32)
AST: 20 IU/L (ref 0–40)
Albumin/Globulin Ratio: 2 (ref 1.1–2.5)
Alkaline Phosphatase: 57 IU/L (ref 39–117)
BILIRUBIN TOTAL: 0.3 mg/dL (ref 0.0–1.2)
BUN/Creatinine Ratio: 16 (ref 11–26)
BUN: 11 mg/dL (ref 8–27)
CO2: 20 mmol/L (ref 18–29)
Calcium: 9.7 mg/dL (ref 8.7–10.3)
Chloride: 98 mmol/L (ref 97–108)
Creatinine, Ser: 0.67 mg/dL (ref 0.57–1.00)
GFR calc Af Amer: 104 mL/min/{1.73_m2} (ref >59)
GFR calc non Af Amer: 90 mL/min/{1.73_m2} (ref >59)
Globulin, Total: 2.3 g/dL (ref 1.5–4.5)
Glucose: 101 mg/dL — ABNORMAL HIGH (ref 65–99)
POTASSIUM: 4.5 mmol/L (ref 3.5–5.2)
Sodium: 139 mmol/L (ref 134–144)
Total Protein: 6.8 g/dL (ref 6.0–8.5)

## 2014-11-23 LAB — CBC
HEMATOCRIT: 41.1 % (ref 34.0–46.6)
Hemoglobin: 13.9 g/dL (ref 11.1–15.9)
MCH: 31.1 pg (ref 26.6–33.0)
MCHC: 33.8 g/dL (ref 31.5–35.7)
MCV: 92 fL (ref 79–97)
PLATELETS: 393 10*3/uL — AB (ref 150–379)
RBC: 4.47 x10E6/uL (ref 3.77–5.28)
RDW: 13.1 % (ref 12.3–15.4)
WBC: 10.2 10*3/uL (ref 3.4–10.8)

## 2014-11-23 LAB — C-REACTIVE PROTEIN: CRP: 3.6 mg/L (ref 0.0–4.9)

## 2014-11-23 LAB — SEDIMENTATION RATE: Sed Rate: 29 mm/hr (ref 0–40)

## 2014-11-23 LAB — TSH: TSH: 2.56 u[IU]/mL (ref 0.450–4.500)

## 2014-11-23 NOTE — Telephone Encounter (Signed)
error 

## 2014-11-23 NOTE — Progress Notes (Signed)
Quick Note:  Please call patient, laboratory showed mild elevated glucose 101, might be related to the timing of the blood sample, otherwise there was no significant abnormality on laboratory evaluations ______

## 2014-11-24 NOTE — Progress Notes (Signed)
Quick Note:    Patient is aware of results    ______

## 2014-11-28 ENCOUNTER — Telehealth: Payer: Self-pay | Admitting: Neurology

## 2014-11-28 DIAGNOSIS — F1190 Opioid use, unspecified, uncomplicated: Secondary | ICD-10-CM

## 2014-11-28 DIAGNOSIS — G4710 Hypersomnia, unspecified: Secondary | ICD-10-CM

## 2014-11-28 DIAGNOSIS — G4730 Sleep apnea, unspecified: Principal | ICD-10-CM

## 2014-11-28 DIAGNOSIS — G894 Chronic pain syndrome: Secondary | ICD-10-CM

## 2014-11-28 NOTE — Telephone Encounter (Signed)
Marcial Pacas, MD, refers patient for attended sleep study.  Height: 5'5"  Weight: 200 lb  BMI: 33.28  Past Medical History:  She had past medical history of hyperlipidemia, reported previous history of "sinus headache", her typical headaches are frontal retro-orbital region pressure pain, she also complains of allergy symptoms, such as watering eyes, runny nose, has been treated as sinus headache  Sleep Symptoms: early morning headaches, excessive daytime sleepiness, fatigue, suggestive of of obstructive sleep apnea     Epworth Score: today's ESS score    Medication:  Amoxicillin (Tab) AMOXIL 500 MG Take 1 tablet (500 mg total) by mouth 2 (two) times daily. X 10 days       Benazepril HCl (Tab) LOTENSIN 40 MG Take 40 mg by mouth daily.       Butalbital-APAP-Caffeine (Tab) FIORICET, ESGIC 50-325-40 MG Take 1-2 tablets by mouth every 6 (six) hours as needed for headache.      Calcium Carbonate-Vit D-Min   Take 1 tablet by mouth 2 (two) times daily.       Cetirizine HCl (Tab) ZYRTEC 10 MG Take 10 mg by mouth daily.      Citalopram Hydrobromide (Tab) CELEXA 20 MG Take 20 mg by mouth daily.       Estradiol (Tab) ESTRACE 2 MG Take 2 mg by mouth daily.       Furosemide (Tab) LASIX 40 MG Take 40 mg by mouth daily.      LORazepam (Tab) ATIVAN 0.5 MG Take 1 tablet by mouth 2 (two) times daily as needed. anxiety      Naproxen (Tablet Delayed Response) EC NAPROSYN 500 MG Take 1 tablet (500 mg total) by mouth as needed (Max 2 tablets in 24 hours. Please only take IF needed).      Naproxen (Tab) NAPROSYN 500 MG take 1 tablet by mouth twice a day if needed for pain or headache with meals      Ondansetron HCl (Tab) ZOFRAN 4 MG Take 1 tablet (4 mg total) by mouth every 6 (six) hours.      Oxycodone-Acetaminophen (Tab) PERCOCET/ROXICET 5-325 MG Take 1-2 tablets by mouth every 6 (six) hours as needed for severe pain. May take 2 tablets PO q 6 hours for severe pain - Do  not take with Tylenol as this tablet already contains tylenol      Pantoprazole Sodium (Tablet Delayed Response) PROTONIX 40 MG Take 40 mg by mouth daily.      Propranolol HCl (Capsule SR 24 hr) INDERAL LA 60 MG Take 1 capsule (60 mg total) by mouth at bedtime.      Simvastatin (Tab) ZOCOR 80 MG Take 80 mg by mouth every evening.       .       Ins: [employer]   Assessment & Plan:  69 year old with past medical history of frequent bifrontal headache, MRI of the brain with small vessel disease no acute lesions, she also complains of early morning headaches, excessive daytime sleepiness, fatigue, today's ESS score is 14, FSS score is 22, suggestive of of obstructive sleep apnea   PLAN: I will refer her for sleep study Laboratory evaluations including ESR C-reactive protein Fioricet as needed for headaches  Please review patient information and submit instructions for scheduling and orders for sleep technologist. Thank you.

## 2014-12-07 ENCOUNTER — Telehealth: Payer: Self-pay | Admitting: *Deleted

## 2014-12-07 NOTE — Telephone Encounter (Signed)
Patient is calling wanting to know if it would be safe to take her Asprin daily and the Naproxen 500 as needed together if she needed to. The pharmacy told her she should call her Neurologist 1st to see if it would be safe. The patient has not taken anything today and would like a call back. Please advise.

## 2014-12-07 NOTE — Telephone Encounter (Signed)
Pamela Hendricks, please call patient, I had reviewed her chart, previous MRI of the brain showed small vessel disease,  She should be take baby aspirin daily, naproxen as needed, less is better, occasionally overlap would be okay

## 2014-12-08 ENCOUNTER — Encounter: Payer: Self-pay | Admitting: *Deleted

## 2014-12-08 NOTE — Telephone Encounter (Signed)
Spoke to patient - she is aware MRI results - she will take a baby aspirin daily (added to her med list).

## 2014-12-31 ENCOUNTER — Ambulatory Visit (INDEPENDENT_AMBULATORY_CARE_PROVIDER_SITE_OTHER): Payer: Medicare Other | Admitting: Neurology

## 2014-12-31 DIAGNOSIS — G4730 Sleep apnea, unspecified: Secondary | ICD-10-CM | POA: Diagnosis not present

## 2014-12-31 DIAGNOSIS — G894 Chronic pain syndrome: Secondary | ICD-10-CM

## 2014-12-31 DIAGNOSIS — G4710 Hypersomnia, unspecified: Secondary | ICD-10-CM | POA: Diagnosis not present

## 2014-12-31 DIAGNOSIS — F1190 Opioid use, unspecified, uncomplicated: Secondary | ICD-10-CM

## 2014-12-31 NOTE — Sleep Study (Signed)
Please see the scanned sleep study interpretation located in the Procedure tab within the Chart Review section. 

## 2015-01-02 ENCOUNTER — Ambulatory Visit (INDEPENDENT_AMBULATORY_CARE_PROVIDER_SITE_OTHER): Payer: Medicare Other | Admitting: Neurology

## 2015-01-02 ENCOUNTER — Encounter: Payer: Self-pay | Admitting: Neurology

## 2015-01-02 VITALS — BP 136/80 | HR 72 | Ht 65.0 in | Wt 206.0 lb

## 2015-01-02 DIAGNOSIS — G4730 Sleep apnea, unspecified: Secondary | ICD-10-CM

## 2015-01-02 DIAGNOSIS — G43909 Migraine, unspecified, not intractable, without status migrainosus: Secondary | ICD-10-CM

## 2015-01-02 NOTE — Progress Notes (Signed)
Reason for visit follow up for  headaches  HPI: Ms Hubka, 69 year old white female returns for followup. She was initially evaluated by Dr. Krista Blue 03/29/2013 for  evaluation of abnormal MRI brain and headaches.   She had past medical history of hyperlipidemia, reported previous history of "sinus headache", her typical headaches are frontal retro-orbital region pressure pain, she also complains of allergy symptoms, such as watering eyes, runny nose, has been treated as sinus headache, taking Claritin every day for many years,.   She began to have different headaches since February 2014, starting at the right occipital region, unbearable, 10 out of 10, lasting for a few hours, there was no light noise sensitivity, no nausea, twice a week, severe,.   She was taken to the emergency room because of one episode severe headaches, had MRI of the brain, which we reviewed together, which has demonstrate mild small vessel disease, no acute lesions, chronic right sphenoid sinusitis, acute left sphenoid sinusitis, She was given prescription of amoxicin and also hydrocodone with Tylenol, which only helps her headaches temporarily,   Since her headache started in February, she has daily headaches, pressure, is also exacerbated to a much more severe squeezing twisting pressure headaches,  07/04/13: She was placed on Pamelor 50 mg by Dr. Krista Blue as well as Naprosyn 500 mg one tablet twice a day if needed. Her headaches are in better control  UPDATE Nov 22 2014: She is now taking Inderal 50 mg daily, which has helped her headaches some, but she continue complains early morning bandlike sensation across her vertex region, moderate severe, sometimes exacerbated to a much more severe headache with associated light noise sensitivity, occasionally eyes breaking headaches, short lasting,  She presented to emergency room November 09 2014, again January second 2015, because acute onset severe holoacranial headaches, repeat CAT  scan continue show chronic right sphenoid sinusitis,  UPDATE Jan 02 2015: She had sleep study in Dec 31, 2014, report pending, her headache overall has much improved, mild bifrontal headaches, pressure, mild to moderate, she has to take naproxen for her arthritis, has multiple hands joints pain, continue has difficulty sleeping, excessive daytime sleepiness,   Her headache overall has much improved, Fioricet as needed has been helpful   ROS:  Easy bruising, itching, allergies   Medications Current Outpatient Prescriptions on File Prior to Visit  Medication Sig Dispense Refill  . aspirin EC 81 MG tablet Take 81 mg by mouth daily.    . benazepril (LOTENSIN) 40 MG tablet Take 40 mg by mouth daily.     . butalbital-acetaminophen-caffeine (FIORICET) 50-325-40 MG per tablet Take 1-2 tablets by mouth every 6 (six) hours as needed for headache. 20 tablet 3  . Calcium Carbonate-Vit D-Min (CALCIUM 1200 PO) Take 1 tablet by mouth 2 (two) times daily.     . cetirizine (ZYRTEC) 10 MG tablet Take 10 mg by mouth daily.    . citalopram (CELEXA) 20 MG tablet Take 20 mg by mouth daily.     Marland Kitchen estradiol (ESTRACE) 2 MG tablet Take 2 mg by mouth daily.     . furosemide (LASIX) 40 MG tablet Take 40 mg by mouth daily.    Marland Kitchen LORazepam (ATIVAN) 0.5 MG tablet Take 1 tablet by mouth 2 (two) times daily as needed. anxiety  0  . naproxen (EC NAPROSYN) 500 MG EC tablet Take 1 tablet (500 mg total) by mouth as needed (Max 2 tablets in 24 hours.  Please only take IF needed). 180 tablet 0  . naproxen (  NAPROSYN) 500 MG tablet take 1 tablet by mouth twice a day if needed for pain or headache with meals 30 tablet 3  . ondansetron (ZOFRAN) 4 MG tablet Take 1 tablet (4 mg total) by mouth every 6 (six) hours. 12 tablet 0  . oxyCODONE-acetaminophen (PERCOCET/ROXICET) 5-325 MG per tablet Take 1-2 tablets by mouth every 6 (six) hours as needed for severe pain. May take 2 tablets PO q 6 hours for severe pain - Do not take with Tylenol  as this tablet already contains tylenol 20 tablet 0  . pantoprazole (PROTONIX) 40 MG tablet Take 40 mg by mouth daily.    . propranolol ER (INDERAL LA) 60 MG 24 hr capsule Take 1 capsule (60 mg total) by mouth at bedtime. 30 capsule 3  . simvastatin (ZOCOR) 80 MG tablet Take 80 mg by mouth every evening.      No current facility-administered medications on file prior to visit.    Allergies No Known Allergies   PHYSICAL EXAMNIATION:  Gen: NAD, conversant, well nourised, obese, well groomed                     Cardiovascular: Regular rate rhythm, no peripheral edema, warm, nontender. Eyes: Conjunctivae clear without exudates or hemorrhage Neck: Supple, no carotid bruise. Pulmonary: Clear to auscultation bilaterally   NEUROLOGICAL EXAM:  MENTAL STATUS: Speech:    Speech is normal; fluent and spontaneous with normal comprehension.  Cognition:    The patient is oriented to person, place, and time;     recent and remote memory intact;     language fluent;     normal attention, concentration,     fund of knowledge.  CRANIAL NERVES: CN II: Visual fields are full to confrontation. Fundoscopic exam is normal with sharp discs and no vascular changes. Venous pulsations are present bilaterally. Pupils are 4 mm and briskly reactive to light. Visual acuity is 20/20 bilaterally. CN III, IV, VI: extraocular movement are normal. No ptosis. CN V: Facial sensation is intact to pinprick in all 3 divisions bilaterally. Corneal responses are intact.  CN VII: Face is symmetric with normal eye closure and smile. CN VIII: Hearing is normal to rubbing fingers CN IX, X: Palate elevates symmetrically. Phonation is normal. CN XI: Head turning and shoulder shrug are intact CN XII: Tongue is midline with normal movements and no atrophy.  MOTOR: There is no pronator drift of out-stretched arms. Muscle bulk and tone are normal. Muscle strength is normal.   Shoulder abduction Shoulder external rotation Elbow  flexion Elbow extension Wrist flexion Wrist extension Finger abduction Hip flexion Knee flexion Knee extension Ankle dorsi flexion Ankle plantar flexion  R 5 5 5 5 5 5 5 5 5 5 5 5   L 5 5 5 5 5 5 5 5 5 5 5 5     REFLEXES: Reflexes are 2+ and symmetric at the biceps, triceps, knees, and ankles. Plantar responses are flexor.  SENSORY: Light touch, pinprick, position sense, and vibration sense are intact in fingers and toes.  COORDINATION: Rapid alternating movements and fine finger movements are intact. There is no dysmetria on finger-to-nose and heel-knee-shin. There are no abnormal or extraneous movements.   GAIT/STANCE: Posture is normal. Gait is steady with normal steps, base, arm swing, and turning. Heel and toe walking are normal. Tandem gait is normal.  Romberg is absent.   ASSESSMENT: 69 year old with past medical history of frequent bifrontal headache, MRI of the brain with small vessel disease no acute  lesions, she also complains of early morning headaches, excessive daytime sleepiness, fatigue, today's ESS score was 14, FSS score was 22, suggestive of of obstructive sleep apnea    PLAN:  I will refer her for sleep study Her headache overall has much improved, continue Fioricet as needed Return to clinic in 3 months   Marcial Pacas, M.D. Ph.D.  [facility] [place], [place] [address] Phone: [phone] Fax:      [phone]

## 2015-01-09 ENCOUNTER — Telehealth: Payer: Self-pay | Admitting: Neurology

## 2015-01-09 NOTE — Telephone Encounter (Signed)
Ins has been contacted, and all clinical info was provided.  Request currently under review.  I called the patient back.  Got no answer.

## 2015-01-09 NOTE — Telephone Encounter (Signed)
Patient stated Prior Authorization was needed for Rx butalbital-acetaminophen-caffeine (FIORICET) 50-325-40 MG per tablet, please call [phone] to start process and fax # [phone].  Patient also requesting a new Rx for 30 day supply instead of 20 day supply due to cost is the same.  Please call and advise.

## 2015-01-11 ENCOUNTER — Encounter: Payer: Self-pay | Admitting: Neurology

## 2015-01-12 ENCOUNTER — Telehealth: Payer: Self-pay

## 2015-01-12 ENCOUNTER — Telehealth: Payer: Self-pay | Admitting: *Deleted

## 2015-01-12 ENCOUNTER — Other Ambulatory Visit: Payer: Self-pay | Admitting: Neurology

## 2015-01-12 DIAGNOSIS — G4733 Obstructive sleep apnea (adult) (pediatric): Secondary | ICD-10-CM

## 2015-01-12 MED ORDER — BUTALBITAL-APAP-CAFFEINE 50-325-40 MG PO TABS: 1.0000 | ORAL_TABLET | Freq: Four times a day (QID) | ORAL | Status: DC | PRN

## 2015-01-12 NOTE — Telephone Encounter (Signed)
Patient would like to know if her Rx for Fioricet can be changed from #20 tabs to #30 tabs per fill, as her co-pay will be the same.  Please advise.  Thank you.

## 2015-01-12 NOTE — Telephone Encounter (Signed)
[employer]) has approved the request for coverage on Fioricet effective until 11/10/2015 Ref # [id number]

## 2015-01-12 NOTE — Telephone Encounter (Signed)
Jessica: Please let patient's know, I have written Fioricet 30 tablets as needed each months, with 5 refills, but she needs to limit the use to less than 2-3 times each week.

## 2015-01-12 NOTE — Telephone Encounter (Signed)
Patient was contacted and provided the results of her sleep study that revealed the presence of OSA and upper airway resistance syndrome.  Patient was informed that Dr. Brett Fairy had recommended a CPAP titration study for treatment.  Patient was in agreement and scheduled her study for 01/28/2015 at 8:00 pm.  Dr. Krista Blue was notified of the results.

## 2015-01-15 ENCOUNTER — Other Ambulatory Visit (HOSPITAL_COMMUNITY): Payer: Self-pay | Admitting: Internal Medicine

## 2015-01-15 DIAGNOSIS — Z1231 Encounter for screening mammogram for malignant neoplasm of breast: Secondary | ICD-10-CM

## 2015-01-28 ENCOUNTER — Ambulatory Visit (INDEPENDENT_AMBULATORY_CARE_PROVIDER_SITE_OTHER): Payer: Medicare Other | Admitting: Neurology

## 2015-01-28 DIAGNOSIS — G4733 Obstructive sleep apnea (adult) (pediatric): Secondary | ICD-10-CM

## 2015-01-28 NOTE — Sleep Study (Signed)
Please see the scanned sleep study interpretation located in the Procedure tab within the Chart Review section.Please see the scanned sleep study interpretation located in the Procedure tab within the Chart Review section.

## 2015-02-15 ENCOUNTER — Encounter: Payer: Self-pay | Admitting: Neurology

## 2015-02-20 ENCOUNTER — Telehealth: Payer: Self-pay

## 2015-02-20 NOTE — Telephone Encounter (Signed)
Left message on cell number to call back for test results (Sleep STudy). The second phone number that we have for her is not a working number.

## 2015-02-20 NOTE — Telephone Encounter (Signed)
Patient returned Pamela Hendricks, [place] call regarding sleep study results and updated contact numbers.

## 2015-02-20 NOTE — Telephone Encounter (Signed)
I spoke to [place] and she is aware of the sleep study results. She is agreeable to proceed with getting CPAP set up. She will need follow up with Dr. Brett Fairy after gettting set up.

## 2015-02-22 ENCOUNTER — Other Ambulatory Visit: Payer: Self-pay | Admitting: Neurology

## 2015-02-22 DIAGNOSIS — G4733 Obstructive sleep apnea (adult) (pediatric): Secondary | ICD-10-CM

## 2015-02-23 ENCOUNTER — Encounter: Payer: Self-pay | Admitting: *Deleted

## 2015-02-23 NOTE — Telephone Encounter (Signed)
Patient contacted and referred to [facility] in [place] per request.  Her husband already a patient there.  Patient mailed a letter informing her of need for follow up appt.

## 2015-03-15 ENCOUNTER — Telehealth: Payer: Self-pay

## 2015-03-15 NOTE — Telephone Encounter (Signed)
Called and spoke to patient and tried to reschedule her apt with Dr.Yan. Patient will call back and reschedule. When Patient calls back please reschedule her with Dr.Yan. Thanks Hinton Dyer. Patient can use a 15 minute slot.

## 2015-03-23 ENCOUNTER — Other Ambulatory Visit: Payer: Self-pay | Admitting: Neurology

## 2015-04-03 ENCOUNTER — Ambulatory Visit: Payer: Medicare Other | Admitting: Neurology

## 2015-06-25 ENCOUNTER — Telehealth: Payer: Self-pay | Admitting: Neurology

## 2015-06-25 NOTE — Telephone Encounter (Signed)
Called the patient and spoke to her about her headache.

## 2015-06-25 NOTE — Telephone Encounter (Signed)
Patient called stating insurance needs prior authorization for butalbital-acetaminophen-caffeine (FIORICET) 50-325-40 MG per tablet . She was given the flu shot last Monday and has had a headache since. She is inquiring if that could have started the headaches again. She states she has been doing great prior. Please call and advise. she can be reached at [phone].

## 2015-06-25 NOTE — Telephone Encounter (Addendum)
Ins has been contacted and provided with clinical info.  Request is under review Ref # [id number]  [facility]) has approved the request for coverage on Fioricet (Butalbital-APAP-Caffeine) efective until 11/10/2015 Ref # [id number]

## 2015-06-25 NOTE — Telephone Encounter (Signed)
Called patient and spoke with her concerning her headache.  Pamela Hendricks will try the [place] Pamela Hendricks has on hand and we will get [name] to help with a PA.  Instructed her to call me back if the migraine does not resolve with her home medications.

## 2015-06-27 ENCOUNTER — Other Ambulatory Visit: Payer: Self-pay

## 2015-06-27 MED ORDER — BUTALBITAL-APAP-CAFFEINE 50-325-40 MG PO TABS: 1.0000 | ORAL_TABLET | Freq: Four times a day (QID) | ORAL | Status: AC | PRN

## 2015-06-27 NOTE — Telephone Encounter (Signed)
Rx signed, faxed and confirmed to the pharmacy [phone]).

## 2015-06-27 NOTE — Telephone Encounter (Signed)
Pamela Hendricks with [facility] pharmacy called requesting new RX for fiorcet #6 per day. She can be reached at [phone].

## 2015-06-27 NOTE — Telephone Encounter (Signed)
Patient does not take 6 tabs daily.  This med is only taken prn.  Refill request sent to provider for approval.

## 2015-07-09 ENCOUNTER — Ambulatory Visit (HOSPITAL_COMMUNITY)
Admission: RE | Admit: 2015-07-09 | Discharge: 2015-07-09 | Disposition: A | Discharge status: Home / Self Care | Payer: Medicare Other | Source: Ambulatory Visit | Attending: Internal Medicine | Admitting: Internal Medicine

## 2015-07-09 DIAGNOSIS — Z1231 Encounter for screening mammogram for malignant neoplasm of breast: Secondary | ICD-10-CM

## 2015-07-09 DIAGNOSIS — R928 Other abnormal and inconclusive findings on diagnostic imaging of breast: Secondary | ICD-10-CM | POA: Diagnosis not present

## 2015-07-11 ENCOUNTER — Other Ambulatory Visit: Payer: Self-pay | Admitting: Internal Medicine

## 2015-07-11 DIAGNOSIS — R928 Other abnormal and inconclusive findings on diagnostic imaging of breast: Secondary | ICD-10-CM

## 2015-07-17 ENCOUNTER — Telehealth: Payer: Self-pay | Admitting: Neurology

## 2015-07-17 ENCOUNTER — Ambulatory Visit
Admission: RE | Admit: 2015-07-17 | Discharge: 2015-07-17 | Disposition: A | Discharge status: Home / Self Care | Payer: Medicare Other | Source: Ambulatory Visit | Attending: Internal Medicine | Admitting: Internal Medicine

## 2015-07-17 DIAGNOSIS — R928 Other abnormal and inconclusive findings on diagnostic imaging of breast: Secondary | ICD-10-CM

## 2015-07-17 NOTE — Telephone Encounter (Signed)
Spoke to patient - she has been experiencing an increase in the frequency of her migraines for the last several weeks.  She has been taking her propranolol ER prn and not using Fioricet at all.  I went over her medications with her - she should be taking propranolol ER every day at the same time to prevent/decrease her migraines and using Fioricet as needed for pain relief for breakthrough migraines.  She verbalized understanding.  I scheduled a follow up appt for her for one month to make sure her headaches are back in good control.  She will call back with any further questions or concerns.

## 2015-07-17 NOTE — Telephone Encounter (Signed)
Patient is calling and states that she is having bad headaches and would like to come by and see the doctor.  She will be at other appointments today by insists that she will come by here.  Please call.

## 2015-07-25 ENCOUNTER — Other Ambulatory Visit: Payer: Self-pay | Admitting: Gastroenterology

## 2015-08-13 ENCOUNTER — Encounter: Payer: Self-pay | Admitting: Neurology

## 2015-08-13 ENCOUNTER — Ambulatory Visit (INDEPENDENT_AMBULATORY_CARE_PROVIDER_SITE_OTHER): Payer: Medicare Other | Admitting: Neurology

## 2015-08-13 VITALS — BP 114/63 | HR 78 | Ht 65.0 in | Wt 206.0 lb

## 2015-08-13 DIAGNOSIS — J323 Chronic sphenoidal sinusitis: Secondary | ICD-10-CM | POA: Diagnosis not present

## 2015-08-13 DIAGNOSIS — J329 Chronic sinusitis, unspecified: Secondary | ICD-10-CM | POA: Insufficient documentation

## 2015-08-13 NOTE — Patient Instructions (Signed)
Magnesium oxide 400 mg twice a day Riboflavin  100 mg twice a day 

## 2015-08-13 NOTE — Progress Notes (Signed)
Chief Complaint  Patient presents with  . Migraine    She is getting between 1-4 migraines monthly.  Sometimes her migraines will last several days but tend to respond well to Fioricet.    Reason for visit follow up for  headaches  HPI: Pamela Hendricks, 69 year old white female returns for followup. She was initially evaluated by Dr. Krista Blue 03/29/2013 for  evaluation of abnormal MRI brain and headaches.   She had past medical history of hyperlipidemia, reported previous history of "sinus headache", her typical headaches are frontal retro-orbital region pressure pain, she also complains of allergy symptoms, such as watering eyes, runny nose, has been treated as sinus headache, taking Claritin every day for many years,.   She began to have different headaches since February 2014, starting at the right occipital region, unbearable, 10 out of 10, lasting for a few hours, there was no light noise sensitivity, no nausea, twice a week, severe,.   She was taken to the emergency room because of one episode severe headaches, had MRI of the brain, which we reviewed together, which has demonstrate mild small vessel disease, no acute lesions, chronic right sphenoid sinusitis, acute left sphenoid sinusitis, She was given prescription of amoxicin and also hydrocodone with Tylenol, which only helps her headaches temporarily,   Since her headache started in February, she has daily headaches, pressure, is also exacerbated to a much more severe squeezing twisting pressure headaches,  07/04/13: She was placed on Pamelor 50 mg by Dr. Krista Blue as well as Naprosyn 500 mg one tablet twice a day if needed. Her headaches are in better control  UPDATE Nov 22 2014: She is now taking Inderal 50 mg daily, which has helped her headaches some, but she continue complains early morning bandlike sensation across her vertex region, moderate severe, sometimes exacerbated to a much more severe headache with associated light noise sensitivity,  occasionally eyes breaking headaches, short lasting,  She presented to emergency room November 09 2014, again January second 2015, because acute onset severe holoacranial headaches, repeat CAT scan continue show chronic right sphenoid sinusitis,  UPDATE Jan 02 2015: She had sleep study in Dec 31, 2014, report pending, her headache overall has much improved, mild bifrontal headaches, pressure, mild to moderate, she has to take naproxen for her arthritis, has multiple hands joints pain, continue has difficulty sleeping, excessive daytime sleepiness,   Her headache overall has much improved, Fioricet as needed has been helpful   UPDATE Aug 13 2015: She has sleep study,, informed OSA, had CPAP titrating, but she has not kept facial mask yet, she recently moved, she continue complains of frequent bifrontal pressure headaches, taking Fioricet as needed, she is also taking Inderal daily,   I have reviewed CAT scan in December 2015, no acute intracranial abnormality, chronic right sphenoid sinusitis,  ROS:  Review of system was performed at 14 systems, relevant are excessive sweating, runny nose, trouble swallowing, leg swelling, incontinence of bladder, joint pain, muscle cramps, daytime sleepiness, snoring, bruise easily, headaches,   Medications Current Outpatient Prescriptions on File Prior to Visit  Medication Sig Dispense Refill  . aspirin EC 81 MG tablet Take 81 mg by mouth daily.    . benazepril (LOTENSIN) 40 MG tablet Take 40 mg by mouth daily.     . butalbital-acetaminophen-caffeine (FIORICET) 50-325-40 MG per tablet Take 1 tablet by mouth every 6 (six) hours as needed for headache. 30 tablet 3  . Calcium Carbonate-Vit D-Min (CALCIUM 1200 PO) Take 1 tablet by mouth 2 (two)  times daily.     . cetirizine (ZYRTEC) 10 MG tablet Take 10 mg by mouth daily.    . citalopram (CELEXA) 20 MG tablet Take 20 mg by mouth daily.     Marland Kitchen estradiol (ESTRACE) 2 MG tablet Take 2 mg by mouth daily.     .  furosemide (LASIX) 40 MG tablet Take 40 mg by mouth daily.    Marland Kitchen LORazepam (ATIVAN) 0.5 MG tablet Take 1 tablet by mouth 2 (two) times daily as needed. anxiety  0  . naproxen (EC NAPROSYN) 500 MG EC tablet Take 1 tablet (500 mg total) by mouth as needed (Max 2 tablets in 24 hours.  Please only take IF needed). 180 tablet 0  . naproxen (NAPROSYN) 500 MG tablet take 1 tablet by mouth twice a day if needed for pain or headache with meals 30 tablet 3  . ondansetron (ZOFRAN) 4 MG tablet Take 1 tablet (4 mg total) by mouth every 6 (six) hours. 12 tablet 0  . oxyCODONE-acetaminophen (PERCOCET/ROXICET) 5-325 MG per tablet Take 1-2 tablets by mouth every 6 (six) hours as needed for severe pain. May take 2 tablets PO q 6 hours for severe pain - Do not take with Tylenol as this tablet already contains tylenol 20 tablet 0  . pantoprazole (PROTONIX) 40 MG tablet Take 40 mg by mouth daily.    . propranolol ER (INDERAL LA) 60 MG 24 hr capsule take 1 capsule by mouth at bedtime 30 capsule 6  . simvastatin (ZOCOR) 80 MG tablet Take 80 mg by mouth every evening.      No current facility-administered medications on file prior to visit.    Allergies No Known Allergies   PHYSICAL EXAMNIATION:  Gen: NAD, conversant, well nourised, obese, well groomed                     Cardiovascular: Regular rate rhythm, no peripheral edema, warm, nontender. Eyes: Conjunctivae clear without exudates or hemorrhage Neck: Supple, no carotid bruise. Pulmonary: Clear to auscultation bilaterally   NEUROLOGICAL EXAM:  MENTAL STATUS: Speech:    Speech is normal; fluent and spontaneous with normal comprehension.  Cognition:    The patient is oriented to person, place, and time;     recent and remote memory intact;     language fluent;     normal attention, concentration,     fund of knowledge.  CRANIAL NERVES: CN II: Visual fields are full to confrontation. Fundoscopic exam is normal with sharp discs and no vascular changes.  Venous pulsations are present bilaterally. Pupils are round and briskly reactive to light. Visual acuity is 20/20 bilaterally. CN III, IV, VI: extraocular movement are normal. No ptosis. CN V: Facial sensation is intact to pinprick in all 3 divisions bilaterally. Corneal responses are intact.  CN VII: Face is symmetric with normal eye closure and smile. CN VIII: Hearing is normal to rubbing fingers CN IX, X: Palate elevates symmetrically. Phonation is normal. CN XI: Head turning and shoulder shrug are intact CN XII: Tongue is midline with normal movements and no atrophy.  MOTOR: There is no pronator drift of out-stretched arms. Muscle bulk and tone are normal. Muscle strength is normal.  REFLEXES: Reflexes are 2+ and symmetric at the biceps, triceps, knees, and ankles. Plantar responses are flexor.  SENSORY: Light touch, pinprick, position sense, and vibration sense are intact in fingers and toes.  COORDINATION: Rapid alternating movements and fine finger movements are intact. There is no dysmetria on finger-to-nose  and heel-knee-shin. There are no abnormal or extraneous movements.   GAIT/STANCE: Posture is normal. Gait is steady with normal steps, base, arm swing, and turning. Heel and toe walking are normal. Tandem gait is normal.  Romberg is absent.   ASSESSMENT/PLAN: Tension headaches  Continue Inderal LA 60mg  qday  As needed Fioricet Obstructive sleep apnea  Confirmed by abnormal sleep study in April 2016,  Will refer her for DME for facial mask Chronic sinusitis  I will refer her to ENT   Marcial Pacas, M.D. Ph.D.  [facility] [place], [place] [address] Phone: [phone] Fax:      [phone]

## 2015-10-01 ENCOUNTER — Other Ambulatory Visit: Payer: Self-pay | Admitting: Otolaryngology

## 2015-11-15 ENCOUNTER — Other Ambulatory Visit: Payer: Self-pay | Admitting: Neurology

## 2015-11-20 ENCOUNTER — Ambulatory Visit: Payer: Medicare Other | Admitting: Nurse Practitioner

## 2015-12-17 ENCOUNTER — Other Ambulatory Visit: Payer: Self-pay | Admitting: *Deleted

## 2015-12-17 MED ORDER — PROPRANOLOL HCL ER 60 MG PO CP24
60.0000 mg | ORAL_CAPSULE | Freq: Every day | ORAL | Status: DC
Start: 2015-12-17 — End: 2016-12-30

## 2016-03-24 ENCOUNTER — Ambulatory Visit (INDEPENDENT_AMBULATORY_CARE_PROVIDER_SITE_OTHER): Payer: Medicare Other

## 2016-03-24 ENCOUNTER — Ambulatory Visit (INDEPENDENT_AMBULATORY_CARE_PROVIDER_SITE_OTHER): Payer: Medicare Other | Admitting: Podiatry

## 2016-03-24 ENCOUNTER — Encounter: Payer: Self-pay | Admitting: Podiatry

## 2016-03-24 VITALS — BP 128/66 | HR 76 | Resp 16

## 2016-03-24 DIAGNOSIS — M722 Plantar fascial fibromatosis: Secondary | ICD-10-CM | POA: Diagnosis not present

## 2016-03-24 NOTE — Progress Notes (Signed)
   Subjective:    Patient ID: Pamela Hendricks, female    DOB: Aug 21, 1946, 70 y.o.   MRN: [id number]  HPI: She presents today after several months of aching to her left foot. She states the arch and heel is hurting particularly in the morning and after sitting for long period time and trying to walk again. She states she's really done nothing to help this.    Review of Systems  Constitutional: Positive for fatigue.  HENT: Positive for sinus pressure, sneezing and trouble swallowing.   Respiratory: Positive for shortness of breath.   Musculoskeletal: Positive for arthralgias and gait problem.  Hematological: Bruises/bleeds easily.  All other systems reviewed and are negative.      Objective:   Physical Exam:Vital signs are stable alert and oriented 3 no distress. Pulses are strongly palpable. Neurologic sensorium is intact. Deep tendon reflexes are intact. Muscle strength is all bilateral. Orthopedic evaluation and strict pain on palpation medial calcaneal tubercle of the left heel. Radiographs do demonstrate plantar distally oriented calcaneal heel spur with soft tissue increase in density of plantar fascia insertion site indicative of plantar fasciitis. Cutaneous evaluation does not demonstrate any type of infection or open wounds.        Assessment & Plan:  Plantar fasciitis left.  Plan: Discussed etiology pathology conservative versus surgical therapies. At this point we did not put her on any oral medication however we did inject the left heel lacer and plantar fascia brace and a night splint. We discussed appropriate shoe gear just exercises and ice therapy I will follow-up with her in 1 month.

## 2016-03-24 NOTE — Patient Instructions (Signed)

## 2016-04-21 ENCOUNTER — Encounter: Payer: Self-pay | Admitting: Podiatry

## 2016-04-21 ENCOUNTER — Ambulatory Visit (INDEPENDENT_AMBULATORY_CARE_PROVIDER_SITE_OTHER): Payer: Medicare Other | Admitting: Podiatry

## 2016-04-21 VITALS — BP 128/69 | HR 86 | Resp 12

## 2016-04-21 DIAGNOSIS — M722 Plantar fascial fibromatosis: Secondary | ICD-10-CM

## 2016-04-21 DIAGNOSIS — L600 Ingrowing nail: Secondary | ICD-10-CM

## 2016-04-21 NOTE — Progress Notes (Signed)
She presents today with a chief complaint of painful heel still bothers her sometimes left. She is also concerned about ingrown toenails to the hallux bilateral.  Objective: Vital signs are stable alert and oriented 3. Pulses are palpable. Neurologic sensorium is intact. Radial nail margin to the inferior borders of the hallux bilateral. She has pain on palpation medially in a gentle of the left heel.  Assessment: Ingrown nails hallux bilateral. Plantar fasciitis left   Injected the left heel today with Kenalog and local anesthetic. Follow-up with her in a few months for possible matrixectomy's.

## 2016-05-26 ENCOUNTER — Ambulatory Visit: Payer: Medicare Other | Admitting: Podiatry

## 2016-05-27 ENCOUNTER — Other Ambulatory Visit: Payer: Self-pay | Admitting: Internal Medicine

## 2016-05-27 DIAGNOSIS — Z853 Personal history of malignant neoplasm of breast: Secondary | ICD-10-CM

## 2016-06-02 ENCOUNTER — Encounter: Payer: Self-pay | Admitting: Podiatry

## 2016-06-02 ENCOUNTER — Ambulatory Visit (INDEPENDENT_AMBULATORY_CARE_PROVIDER_SITE_OTHER): Payer: Medicare Other | Admitting: Podiatry

## 2016-06-02 ENCOUNTER — Telehealth: Payer: Self-pay | Admitting: *Deleted

## 2016-06-02 DIAGNOSIS — M7751 Other enthesopathy of right foot: Secondary | ICD-10-CM | POA: Diagnosis not present

## 2016-06-02 DIAGNOSIS — M778 Other enthesopathies, not elsewhere classified: Secondary | ICD-10-CM

## 2016-06-02 DIAGNOSIS — M779 Enthesopathy, unspecified: Principal | ICD-10-CM

## 2016-06-02 DIAGNOSIS — L600 Ingrowing nail: Secondary | ICD-10-CM

## 2016-06-02 MED ORDER — NEOMYCIN-POLYMYXIN-HC 3.5-10000-1 OT SOLN: OTIC | 0 refills | Status: DC

## 2016-06-02 NOTE — Progress Notes (Signed)
She presents today with a chief complaint of pain to the hallux nail plates bilateral. She is also concerned about a painful second metatarsophalangeal joint of the left foot. She states the ingrown toenails been present for quite some time and we'll schedule her today to have those removed. She would also like to consider an injection to the second metatarsophalangeal joint of the left foot.  Objective: Vital signs are stable alert and oriented 3 pulses are palpable digital hair growth is noted. No calf pain. Sharp incurvated toenails are exquisitely tender and painful on palpation as well as debridement. He has swelling around the second metatarsophalangeal joint with pain on end range of motion of the second toe at the level of the joint.  Assessment: Pain and limb secondary to ingrown toenails in the hallux bilateral. And capsulitis of the second metatarsophalangeal joint left.  Plan: Chemical matrixectomy's were performed today to the hallux bilateral after local anesthesia was administered. She tolerated the procedure well without complications. I injected the second metatarsophalangeal joint of the left foot today with Kenalog and local anesthetic. She was provided with both oral and written home-going instructions for care and soaking of her toes as well as a prescription for Cortisporin Otic to be applied twice daily after soaking. I will follow-up with her in 1 week just to make sure she is healing well.

## 2016-06-02 NOTE — Patient Instructions (Signed)

## 2016-06-02 NOTE — Telephone Encounter (Signed)
Pt states she went to the pharmacy to purchase the medication ordered today, by Dr. Milinda Pointer and it cost $45.00 and she didn't purchase it nor ask what it was.  I reviewed the medications ordered today and Dr. Milinda Pointer ordered Cortisporin Otic drops.  I told pt she could use Neosporin. Pt states understanding but she wanted Dr. Milinda Pointer to know what she was on.

## 2016-06-16 ENCOUNTER — Other Ambulatory Visit: Payer: Self-pay | Admitting: Podiatry

## 2016-06-16 ENCOUNTER — Encounter: Payer: Self-pay | Admitting: Podiatry

## 2016-06-16 ENCOUNTER — Ambulatory Visit (INDEPENDENT_AMBULATORY_CARE_PROVIDER_SITE_OTHER): Payer: Medicare Other | Admitting: Podiatry

## 2016-06-16 DIAGNOSIS — M7751 Other enthesopathy of right foot: Secondary | ICD-10-CM | POA: Diagnosis not present

## 2016-06-16 DIAGNOSIS — L02619 Cutaneous abscess of unspecified foot: Secondary | ICD-10-CM

## 2016-06-16 DIAGNOSIS — M778 Other enthesopathies, not elsewhere classified: Secondary | ICD-10-CM

## 2016-06-16 DIAGNOSIS — L600 Ingrowing nail: Secondary | ICD-10-CM

## 2016-06-16 DIAGNOSIS — L03039 Cellulitis of unspecified toe: Secondary | ICD-10-CM | POA: Diagnosis not present

## 2016-06-16 DIAGNOSIS — M779 Enthesopathy, unspecified: Secondary | ICD-10-CM

## 2016-06-16 MED ORDER — SULFAMETHOXAZOLE-TRIMETHOPRIM 800-160 MG PO TABS: 1.0000 | ORAL_TABLET | Freq: Two times a day (BID) | ORAL | 0 refills | Status: DC

## 2016-06-16 NOTE — Progress Notes (Signed)
She presents today for follow-up of her matrixectomy tibia fibular border, hallux bilateral. She states they're very sore and it been hurting. She states that she has been soaking in Epsom salts and warm water after having completed a week of the Betadine. She denies fevers chills nausea vomiting but I think that these are infected.  Objective: Vital signs are stable she is alert 3. Pulses are palpable. She's been soaking these as often as she can axis also warm water. She's been applying Neosporin and other ointments to the wound the same time.  Assessment: Mild paronychia hallux bilateral status post meniscectomies.  Plan: Discontinue Betadine so that is also more more soaks only Epsom salts and warm water soaks no focal medication and I also recommended an oral antibiotic Bactrim DS twice daily. Follow-up with her in 2 weeks

## 2016-06-18 LAB — AEROBIC CULTURE

## 2016-06-30 ENCOUNTER — Encounter: Payer: Self-pay | Admitting: Podiatry

## 2016-06-30 ENCOUNTER — Ambulatory Visit (INDEPENDENT_AMBULATORY_CARE_PROVIDER_SITE_OTHER): Payer: Medicare Other | Admitting: Podiatry

## 2016-06-30 DIAGNOSIS — L03039 Cellulitis of unspecified toe: Secondary | ICD-10-CM

## 2016-06-30 NOTE — Progress Notes (Signed)
She presents today for follow-up of her paronychia hallux bilateral. She states my toenails looks so much better and I had 3 pills left ago, antibiotics. She denies fever chills nausea vomiting muscle aches and pains. She denies calf pain shortness of breath or chest pain.  Objective: Vital signs are stable she's alert and oriented 3. The hallux nails demonstrate well healing matrixectomy sites at this point with nearly 100% resolution of the infection. No drainage cellulitis or odor. Dry scaly skin is present.  Assessment: Well-healing paronychia status post matrixectomy tibia and fibular border hallux bilaterally.  Plan: Continue antibiotics until completed continue to soak twice daily covered in the daytime and leave open at bedtime I will follow-up with her in 3 weeks if she is not completely resolved.

## 2016-07-21 ENCOUNTER — Ambulatory Visit: Payer: Medicare Other | Admitting: Podiatry

## 2016-07-21 ENCOUNTER — Ambulatory Visit
Admission: RE | Admit: 2016-07-21 | Discharge: 2016-07-21 | Disposition: A | Discharge status: Home / Self Care | Payer: Self-pay | Source: Ambulatory Visit | Attending: Internal Medicine | Admitting: Internal Medicine

## 2016-07-21 DIAGNOSIS — Z853 Personal history of malignant neoplasm of breast: Secondary | ICD-10-CM

## 2016-10-27 ENCOUNTER — Ambulatory Visit: Payer: Medicare Other | Admitting: Podiatry

## 2016-12-10 ENCOUNTER — Ambulatory Visit: Payer: Medicare Other | Admitting: Podiatry

## 2016-12-30 ENCOUNTER — Other Ambulatory Visit: Payer: Self-pay | Admitting: Neurology

## 2017-01-26 ENCOUNTER — Ambulatory Visit: Payer: Medicare Other | Admitting: Podiatry

## 2017-02-10 ENCOUNTER — Encounter: Payer: Self-pay | Admitting: Podiatry

## 2017-02-10 ENCOUNTER — Ambulatory Visit (INDEPENDENT_AMBULATORY_CARE_PROVIDER_SITE_OTHER): Payer: Medicare Other

## 2017-02-10 ENCOUNTER — Ambulatory Visit (INDEPENDENT_AMBULATORY_CARE_PROVIDER_SITE_OTHER): Payer: Medicare Other | Admitting: Podiatry

## 2017-02-10 DIAGNOSIS — M2042 Other hammer toe(s) (acquired), left foot: Secondary | ICD-10-CM | POA: Diagnosis not present

## 2017-02-10 DIAGNOSIS — M7752 Other enthesopathy of left foot: Secondary | ICD-10-CM | POA: Diagnosis not present

## 2017-02-10 MED ORDER — MELOXICAM 15 MG PO TABS: 15.0000 mg | ORAL_TABLET | Freq: Every day | ORAL | 1 refills | Status: AC

## 2017-02-10 NOTE — Progress Notes (Signed)
   Subjective:  Patient presents today for follow-up treatment for bilateral O ingrown toenails that she had taken out in 06/30/2016 by Dr. Milinda Pointer. Patient states that she still has some sensitivity to the bilateral great toes. Patient also has a new complaint today of second toe pain to the left foot that's been going on for approximately 2 weeks. Patient denies trauma. She states that the toe is red with swelling in the forefoot. Patient's been soaking her foot with some alleviation of symptoms. Patient presents today for further treatment and evaluation    Objective/Physical Exam General: The patient is alert and oriented x3 in no acute distress.  Dermatology: Skin is warm, dry and supple bilateral lower extremities. Negative for open lesions or macerations.  Vascular: Palpable pedal pulses bilaterally. No edema or erythema noted. Capillary refill within normal limits.  Neurological: Epicritic and protective threshold grossly intact bilaterally.   Musculoskeletal Exam: Pain on palpation and range of motion to the second MPJ left foot. Localized swelling noted. Hammertoe contracture deformity noted to the second digit left foot as well.  Assessment: #1 second MPJ capsulitis left foot #2 hammertoe second digit left foot.   Plan of Care:  #1 Patient was evaluated. #2 prescription for meloxicam #3 today a postoperative shoe was dispensed #4 return to clinic in 4 weeks. If the patient is not better we will likely proceed with surgery.   Edrick Kins, DPM [facility]  Dr. Edrick Kins, [address]                                        [place], [place] [address]                Office [phone]  Fax [phone]

## 2017-03-10 ENCOUNTER — Ambulatory Visit (INDEPENDENT_AMBULATORY_CARE_PROVIDER_SITE_OTHER): Payer: Medicare Other | Admitting: Podiatry

## 2017-03-10 DIAGNOSIS — M2042 Other hammer toe(s) (acquired), left foot: Secondary | ICD-10-CM

## 2017-03-10 DIAGNOSIS — M7752 Other enthesopathy of left foot: Secondary | ICD-10-CM

## 2017-03-11 NOTE — Progress Notes (Signed)
   Subjective:  Patient presents today for follow-up evaluation of left second toe pain. She states the pain is better, however it has not completely resolved. She has been taking Meloxicam with moderate relief of the pain.    Objective/Physical Exam General: The patient is alert and oriented x3 in no acute distress.  Dermatology: Skin is warm, dry and supple bilateral lower extremities. Negative for open lesions or macerations.  Vascular: Palpable pedal pulses bilaterally. No edema or erythema noted. Capillary refill within normal limits.  Neurological: Epicritic and protective threshold grossly intact bilaterally.   Musculoskeletal Exam: Pain on palpation and range of motion to the second MPJ left foot. Localized swelling noted. Hammertoe contracture deformity noted to the second digit left foot as well.  Assessment: #1 second MPJ capsulitis left foot #2 hammertoe second digit left foot.   Plan of Care:  #1 Patient was evaluated. #2 Silicone toe caps dispensed #3 Darco toe splint dispensed  #4 Continue wearing post op shoe as needed #5 Patient declines surgery at this time #6 Return to clinic in 4 weeks   Edrick Kins, DPM [facility]  Dr. Edrick Kins, [address]                                        [place], [place] [address]                Office [phone]  Fax [phone]

## 2017-03-26 ENCOUNTER — Other Ambulatory Visit: Payer: Self-pay | Admitting: Internal Medicine

## 2017-03-26 DIAGNOSIS — R072 Precordial pain: Secondary | ICD-10-CM

## 2017-03-31 ENCOUNTER — Encounter (INDEPENDENT_AMBULATORY_CARE_PROVIDER_SITE_OTHER): Payer: Self-pay

## 2017-03-31 ENCOUNTER — Ambulatory Visit (INDEPENDENT_AMBULATORY_CARE_PROVIDER_SITE_OTHER): Payer: Medicare Other

## 2017-03-31 DIAGNOSIS — R072 Precordial pain: Secondary | ICD-10-CM

## 2017-03-31 LAB — EXERCISE TOLERANCE TEST
Estimated workload: 6.1 METS
Exercise duration (min): 4 min
Exercise duration (sec): 16 s
MPHR: 149 {beats}/min
Peak HR: 131 {beats}/min
Percent HR: 87 %
RPE: 17
Rest HR: 78 {beats}/min

## 2017-04-07 ENCOUNTER — Ambulatory Visit: Payer: Medicare Other | Admitting: Podiatry

## 2017-05-06 ENCOUNTER — Other Ambulatory Visit: Payer: Self-pay | Admitting: Internal Medicine

## 2017-05-06 DIAGNOSIS — Z1231 Encounter for screening mammogram for malignant neoplasm of breast: Secondary | ICD-10-CM

## 2017-07-07 ENCOUNTER — Encounter: Payer: Self-pay | Admitting: Physician Assistant

## 2017-07-07 NOTE — Progress Notes (Signed)
Cardiology Office Note    Date:  07/09/2017  ID:  Pamela, Hendricks 18-Jun-1946, MRN [id number] PCP:  Pamela Cha, MD  Cardiologist:  Pamela Hendricks, reviewed with Dr. Marlou Hendricks. Patient's late husband and late daughter followed with Dr. Doreatha Hendricks so patient also remembers Pamela Merle NP fondly.   Chief Complaint: chest pain  History of Present Illness:  Pamela Hendricks is a 71 y.o. female with history of carotid artery disease, GERD, HTN, HLD, arthritis, OSA who is being seen today for the evaluation of chest pain at the request of [place]. She denies formal coronary history but does report in 2012 she was evaluated by a mobile CV screening unit which told her she had carotid blockages but not severe enough to require surgery. She was told this would put her at risk of a heart attack in the future. She has not had any formal followup for this since. PCP notes prior finding of atherosclerotic changes of aorta on noninvasive imaging. Her husband was a patient of Dr. Susa Simmonds Valente Hendricks). Her daughter Pamela Hendricks) was also a patient of Dr. Doreatha Hendricks - she had type 1 DM at age 53 and ended up having several strokes and bypass surgery in her 32s, before passing at age 64. Her father had MI at 46 and died at age 31.   For several years now she has had episodic chest pain occurring seemingly at random, gripping her in the middle/left side of her chest sometimes described as a sharp stabbing sensation. It can last for minutes to hours. It sometimes happens once or twice a day. It mostly seems to occur with rest, without particular association with exertion that she's noticed. She does not exercise due to orthopedic issues. She is awaiting upcoming surgery on her left toe and possibly a knee replacement in the next year. She does not have any dyspnea, nausea, vomiting, palpitations or syncope. CP is not worse with exertion, inspiration, position changes or palpation and resolves spontaneously. She  has not tried anything at time of occurrence to make it go away aside from resting/praying. She saw her PCP for this discomfort and ETT was arranged. ETT 03/31/2017 showed mildly reduced exercise tolerance, hypertensive response to exercise 216/82, otherwise normal stress ECG. Labs by primary care 06/15/17 showed Tchol 164, trig 139, HDL 45, LDL 91, Na 139, K 4.3, CO2 29, BUN 17, Cr 0.97, LFTs wnl, Hgb 12.9, WBC 9.2, Plt 304.   Past Medical History:  Diagnosis Date  . Arthritis   . Carotid artery disease ([place])    a. pt reports this was diagnosed by one of those mobile screening test sites in 2012.  Marland Kitchen GERD (gastroesophageal reflux disease)   . Hyperlipidemia   . Hypertension   . Migraines   . OSA (obstructive sleep apnea)   . Peripheral neuropathy   . Snores   . Wears glasses     Past Surgical History:  Procedure Laterality Date  . ABDOMINAL HYSTERECTOMY  1970   Total HYST  . COLONOSCOPY    . JOINT REPLACEMENT  2001   Left Knee Replacement  . KNEE ARTHROSCOPY     lt  . WISDOM TOOTH EXTRACTION      Current Medications: Current Meds  Medication Sig  . aspirin EC 81 MG tablet Take 81 mg by mouth daily.  . benazepril (LOTENSIN) 40 MG tablet Take 40 mg by mouth daily.   . Calcium Carbonate-Vit D-Min (CALCIUM 1200 PO) Take 1 tablet by mouth 2 (two) times daily.   Marland Kitchen  furosemide (LASIX) 40 MG tablet Take 40 mg by mouth daily.  . pantoprazole (PROTONIX) 40 MG tablet Take 40 mg by mouth daily.  . propranolol ER (INDERAL LA) 60 MG 24 hr capsule Take 1 capsule (60 mg total) by mouth at bedtime. Please call [phone] to schedule appt.  . simvastatin (ZOCOR) 80 MG tablet Take 80 mg by mouth every evening.      Allergies:   Patient has no known allergies.   Social History   Social History  . Marital status: Widowed    Spouse name: Pamela Hendricks  . Number of children: 2  . Years of education: 11   Occupational History  . retired     works partime with vendors   Social History Main Topics    . Smoking status: Never Smoker  . Smokeless tobacco: Never Used  . Alcohol use No  . Drug use: No  . Sexual activity: Not Asked   Other Topics Concern  . None   Social History Narrative   Patient is widowed. Patient is retired . Patient works part time with a vendor's with Pamela Hendricks. Patient has a 11th grade education. She has two children     Family History:  Family History  Problem Relation Age of Onset  . Cancer Mother   . Hypertension Mother   . Hypertension Father   . Heart disease Father        MI around 16, died at 33  . Cancer Brother        Prostate Ca  . Cancer Sister        Brain Ca  . CAD Daughter        Had bypass in her 77s (type 1 diabetic), died at 61   ROS:   Please see the history of present illness.  All other systems are reviewed and otherwise negative.    PHYSICAL EXAM:   VS:  BP 116/60 (BP Location: Left Arm, Patient Position: Sitting, Cuff Size: Large)   Pulse 78   Ht 5\' 5"  (1.651 m)   Wt 210 lb (95.3 kg)   SpO2 95%   BMI 34.95 kg/m   BMI: Body mass index is 34.95 kg/m. GEN: Well nourished, well developed WF, in no acute distress  HEENT: normocephalic, atraumatic Neck: no JVD, carotid bruits, or masses Cardiac: RRR; no murmurs, rubs, or gallops, no edema  Respiratory:  clear to auscultation bilaterally, normal work of breathing GI: soft, nontender, nondistended, + BS MS: no deformity or atrophy  Skin: warm and dry, no rash Neuro:  Alert and Oriented x 3, Strength and sensation are intact, follows commands Psych: euthymic mood, full affect  Wt Readings from Last 3 Encounters:  07/09/17 210 lb (95.3 kg)  08/13/15 206 lb (93.4 kg)  01/02/15 206 lb (93.4 kg)      Studies/Labs Reviewed:   EKG:  EKG was ordered today and personally reviewed by me and demonstrates NSR 77bpm, nonspecific ST-T Changes including TWI avL,appears similar to 2015  Recent Labs: No results found for requested labs within last 8760 hours.   Lipid Panel No  results found for: CHOL, TRIG, HDL, CHOLHDL, VLDL, LDLCALC, LDLDIRECT  Additional studies/ records that were reviewed today include: Summarized above.    ASSESSMENT & PLAN:   This patient's case was discussed in depth with Dr.Skains. The plan below was formulated per our discussion.  1. Atypical chest pain - symptoms are largely atypical and persistent for several years. EKG has nonspecific ST-T changes. Although ETT was unremarkable,  this was several months ago and her exercise tolerance was decreased both due to fatigue/dyspnea as well as chronic orthopedic issues. Given continued symptoms as well as upcoming orthopedic surgery, will obtain Lexiscan nuclear stress test to fully assess perfusion and risk stratify. This will also give [place] information on her LV function. Given that she already has known atherosclerotic disease of the aorta and carotids, would also recommend aggressive risk factor modification. See below regarding statin. 2. Essential HTN - had hypertensive response to exercise several months ago, with high resting BP as well. Blood pressure is well controlled today and was recently controlled at PCP's office as well. Would continue to follow. 3. Hyperlipidemia - given vascular disease, goal LDL is <70. She is on max dose simvastatin. Per guidelines, will switch her to more potent statin in the form of atorvastatin 40mg  qpm. Will check LFTs/lipids in 6 weeks. 4. OSA - risks of uncontrolled OSA reviewed with patient. Encouraged compliance with CPAP. 5. Carotid artery disease - last evaluated around 2012 per patient. No h/o TIA/stroke. Will obtain updated carotid ultrasound.  Disposition: F/u with myself or Pamela Hendricks in 3 weeks after above testing completed - patient is very fond of Cecille Rubin given her excellent care of her daughter and husband in the past.   Medication Adjustments/Labs and Tests Ordered: Current medicines are reviewed at length with the patient today.  Concerns  regarding medicines are outlined above. Medication changes, Labs and Tests ordered today are summarized above and listed in the Patient Instructions accessible in Encounters.   Signed, Charlie Pitter, PA-C  07/09/2017 9:37 AM    [address], [place], [place]  [address] Phone: [phone]; Fax: [phone]

## 2017-07-09 ENCOUNTER — Encounter: Payer: Self-pay | Admitting: Physician Assistant

## 2017-07-09 ENCOUNTER — Telehealth: Payer: Self-pay | Admitting: Physician Assistant

## 2017-07-09 ENCOUNTER — Ambulatory Visit (INDEPENDENT_AMBULATORY_CARE_PROVIDER_SITE_OTHER): Payer: Medicare Other | Admitting: Physician Assistant

## 2017-07-09 ENCOUNTER — Telehealth (HOSPITAL_COMMUNITY): Payer: Self-pay | Admitting: *Deleted

## 2017-07-09 VITALS — BP 116/60 | HR 78 | Ht 65.0 in | Wt 210.0 lb

## 2017-07-09 DIAGNOSIS — E785 Hyperlipidemia, unspecified: Secondary | ICD-10-CM | POA: Diagnosis not present

## 2017-07-09 DIAGNOSIS — G4733 Obstructive sleep apnea (adult) (pediatric): Secondary | ICD-10-CM | POA: Diagnosis not present

## 2017-07-09 DIAGNOSIS — I10 Essential (primary) hypertension: Secondary | ICD-10-CM | POA: Diagnosis not present

## 2017-07-09 DIAGNOSIS — I739 Peripheral vascular disease, unspecified: Secondary | ICD-10-CM

## 2017-07-09 DIAGNOSIS — R0789 Other chest pain: Secondary | ICD-10-CM

## 2017-07-09 DIAGNOSIS — I779 Disorder of arteries and arterioles, unspecified: Secondary | ICD-10-CM | POA: Diagnosis not present

## 2017-07-09 DIAGNOSIS — R079 Chest pain, unspecified: Secondary | ICD-10-CM

## 2017-07-09 MED ORDER — ATORVASTATIN CALCIUM 40 MG PO TABS
40.0000 mg | ORAL_TABLET | Freq: Every day | ORAL | 3 refills | Status: DC
Start: 2017-07-09 — End: 2017-08-19

## 2017-07-09 NOTE — Patient Instructions (Addendum)
Medication Instructions:  Your physician has recommended you make the following change in your medication:  1.  STOP the Simvastatin 2.  START Atorvastatin 40 mg taking 1 tablet in the monrings   Labwork: 6 WEEKS:  FASTING LIPID & LFT  Testing/Procedures: Your physician has requested that you have a carotid duplex. This test is an ultrasound of the carotid arteries in your neck. It looks at blood flow through these arteries that supply the brain with blood. Allow one hour for this exam. There are no restrictions or special instructions.  Your physician has requested that you have a lexiscan myoview. For further information please visit [email or web address]. Please follow instruction sheet, as given.    Follow-Up: Your physician recommends that you schedule a follow-up appointment in: [place], NP OR [name], PA-C   Any Other Special Instructions Will Be Listed Below (If Applicable). Vascular Ultrasound An ultrasound, also called sonography or ultrasonography, uses harmless sound waves to take pictures of the inside of your body. The pictures are taken with a device called a transducer that is held up against your body. The continually changing pictures can be recorded on videotape or film. A vascular ultrasound is a painless test to see if you have blood flow problems or clots in your blood vessels. It may be done to look at blood vessels almost anywhere in the body. There are several types of ultrasounds that can be done to look at the blood vessels. They include:  Continuous wave Doppler ultrasound. This type of ultrasound uses the change in pitch of sound waves to provide information about blood flow through a blood vessel. During the test, a health care provider listens to the sounds produced by the transducer.  Duplex ultrasound. This type of ultrasound uses standard ultrasound methods to produce a picture of a blood vessel and surrounding organs. In addition, a  computer provides information about the speed and direction of blood flow through the blood vessel. With this type of ultrasound it is possible to see the structures inside the body and to evaluate blood flow within those structures at the same time.  Color Doppler ultrasound. This type of ultrasound uses standard ultrasound methods to produce a picture of a blood vessel. In addition, a computer converts the Doppler sounds into colors that are overlaid on the picture of the blood vessel. These colors represent the speed and direction of blood flow through the vessel.  Power Doppler ultrasound. This type of ultrasound is up to five times more sensitive than color Doppler ultrasound. Power Doppler ultrasound can also get pictures that are difficult or impossible to get using standard color Doppler ultrasound. Power Doppler ultrasound is most commonly used to evaluate blood flow through vessels within organs, such as the liver or kidneys.  Transcranial Doppler ultrasound. This type of ultrasound looks at blood flow in blood vessels throughout the brain. It can reveal the presence of narrow arteries, clots blocking the vessels, or malformed blood vessels.  What are the risks? There are no known risks or complications of having an ultrasound. What happens before the procedure?  If the ultrasound scan involves your upper abdomen, you may be directed not to eat, smoke, or chew gum the morning of your exam. Follow your health care provider's instructions.  During the test, a gel will be applied to your skin. Wear clothing that is easily washable in case the gel gets on your clothes. What happens during the procedure?  A gel will be  applied to your skin. It may feel cool.  The transducer will be placed on the area to be examined.  Pictures will be taken. They will be displayed on one or more monitors that look like small television screens. What happens after the procedure?  You can safely drive home  and return to regular activities immediately after your exam.  Keep follow-up visits as directed by your health care provider.  Ask when your test results will be ready. It is your responsibility to get your test results. This information is not intended to replace advice given to you by your health care provider. Make sure you discuss any questions you have with your health care provider. Document Released: 11/07/2004 Document Revised: 04/03/2016 Document Reviewed: 01/19/2014 Elsevier Interactive Patient Education  2018 [place] injection What is this medicine? REGADENOSON is used to test the heart for coronary artery disease. It is used in patients who can not exercise for their stress test. This medicine may be used for other purposes; ask your health care provider or pharmacist if you have questions. COMMON BRAND NAME(S): Lexiscan What should I tell my health care provider before I take this medicine? They need to know if you have any of these conditions: -heart problems -lung or breathing disease, like asthma or COPD -an unusual or allergic reaction to regadenoson, other medicines, foods, dyes, or preservatives -pregnant or trying to get pregnant -breast-feeding How should I use this medicine? This medicine is for injection into a vein. It is given by a health care professional in a hospital or clinic setting. Talk to your pediatrician regarding the use of this medicine in children. Special care may be needed. Overdosage: If you think you have taken too much of this medicine contact a poison control center or emergency room at once. NOTE: This medicine is only for you. Do not share this medicine with others. What if I miss a dose? This does not apply. What may interact with this medicine? -caffeine -dipyridamole -guarana -theophylline This list may not describe all possible interactions. Give your health care provider a list of all the medicines, herbs,  non-prescription drugs, or dietary supplements you use. Also tell them if you smoke, drink alcohol, or use illegal drugs. Some items may interact with your medicine. What should I watch for while using this medicine? Your condition will be monitored carefully while you are receiving this medicine. Do not take medicines, foods, or drinks with caffeine (like coffee, tea, or colas) for at least 12 hours before your test. If you do not know if something contains caffeine, ask your health care professional. What side effects may I notice from receiving this medicine? Side effects that you should report to your doctor or health care professional as soon as possible: -allergic reactions like skin rash, itching or hives, swelling of the face, lips, or tongue -breathing problems -chest pain, tightness or palpitations -severe headache Side effects that usually do not require medical attention (report to your doctor or health care professional if they continue or are bothersome): -flushing -headache -irritation or pain at site where injected -nausea, vomiting This list may not describe all possible side effects. Call your doctor for medical advice about side effects. You may report side effects to FDA at 1-800-FDA-1088. Where should I keep my medicine? This drug is given in a hospital or clinic and will not be stored at home. NOTE: This sheet is a summary. It may not cover all possible information. If you have  questions about this medicine, talk to your doctor, pharmacist, or health care provider.  2018 Elsevier/Gold Standard (2008-06-26 15:08:13)   If you need a refill on your cardiac medications before your next appointment, please call your pharmacy.

## 2017-07-09 NOTE — Telephone Encounter (Signed)
New message     Is the medication used in the myocardial perfusion going to be out of the patient system by 9/6 so that she can have her surgery ??

## 2017-07-09 NOTE — Telephone Encounter (Signed)
Returned pts call and advised her that the nuc med they use for the lexiscan is normally gone out of your sytem within 24 hours and that it shouldn't hinder her from having her sx Pt thanked me for my call back.

## 2017-07-09 NOTE — Telephone Encounter (Signed)
Patient given detailed instructions per Myocardial Perfusion Study Information Sheet for the test on  07/15/17 Patient notified to arrive 15 minutes early and that it is imperative to arrive on time for appointment to keep from having the test rescheduled.  If you need to cancel or reschedule your appointment, please call the office within 24 hours of your appointment. . Patient verbalized understanding. Gurbani Figge Jacqueline    

## 2017-07-14 ENCOUNTER — Telehealth: Payer: Self-pay | Admitting: *Deleted

## 2017-07-14 NOTE — Telephone Encounter (Signed)
lvm with pt to see if pt would like to keep appt. With Melina Copa, PA, or switch appt to [facility].  Left number to call back if pt wants to switch.

## 2017-07-14 NOTE — Telephone Encounter (Signed)
Pt called in to let office know that pt would be happy to see either Melina Copa, PA, or Truitt Merle, NP, will keep appt with Dayna. Verified all upcoming appts.

## 2017-07-15 ENCOUNTER — Telehealth: Payer: Self-pay | Admitting: Cardiology

## 2017-07-15 ENCOUNTER — Ambulatory Visit (HOSPITAL_COMMUNITY)
Admission: RE | Admit: 2017-07-15 | Discharge: 2017-07-15 | Disposition: A | Discharge status: Home / Self Care | Payer: Medicare Other | Source: Ambulatory Visit | Attending: Cardiology | Admitting: Cardiology

## 2017-07-15 ENCOUNTER — Ambulatory Visit (HOSPITAL_BASED_OUTPATIENT_CLINIC_OR_DEPARTMENT_OTHER): Payer: Medicare Other

## 2017-07-15 DIAGNOSIS — R079 Chest pain, unspecified: Secondary | ICD-10-CM

## 2017-07-15 DIAGNOSIS — I779 Disorder of arteries and arterioles, unspecified: Secondary | ICD-10-CM

## 2017-07-15 DIAGNOSIS — I6523 Occlusion and stenosis of bilateral carotid arteries: Secondary | ICD-10-CM | POA: Diagnosis not present

## 2017-07-15 DIAGNOSIS — I739 Peripheral vascular disease, unspecified: Secondary | ICD-10-CM

## 2017-07-15 DIAGNOSIS — R9439 Abnormal result of other cardiovascular function study: Secondary | ICD-10-CM | POA: Diagnosis not present

## 2017-07-15 LAB — MYOCARDIAL PERFUSION IMAGING
CHL CUP NUCLEAR SRS: 2
CHL CUP NUCLEAR SSS: 8
LHR: 0.33
LV sys vol: 15 mL
LVDIAVOL: 60 mL (ref 46–106)
NUC STRESS TID: 0.89
Peak HR: 83 {beats}/min
Rest HR: 66 {beats}/min
SDS: 6

## 2017-07-15 MED ORDER — TECHNETIUM TC 99M TETROFOSMIN IV KIT
10.1000 | PACK | Freq: Once | INTRAVENOUS | Status: AC | PRN
  Administered 2017-07-15: 10.1 via INTRAVENOUS
  Filled 2017-07-15: qty 11

## 2017-07-15 MED ORDER — TECHNETIUM TC 99M TETROFOSMIN IV KIT
32.7000 | PACK | Freq: Once | INTRAVENOUS | Status: AC | PRN
  Administered 2017-07-15: 32.7 via INTRAVENOUS
  Filled 2017-07-15: qty 33

## 2017-07-15 MED ORDER — REGADENOSON 0.4 MG/5ML IV SOLN
0.4000 mg | Freq: Once | INTRAVENOUS | Status: AC
  Administered 2017-07-15: 0.4 mg via INTRAVENOUS

## 2017-07-15 NOTE — Telephone Encounter (Signed)
Left [facility], a message to call me back re: surgical clearance. Per Melina Copa, PA-C, we will not have the results back in time today to clear her for sx.  If they can push it back a day or so, that will be great.  Will wait for return call.

## 2017-07-15 NOTE — Telephone Encounter (Signed)
Returned call to Pamela Hendricks with [employer].Advised we received surgical clearance.Advised patient is not Dr.Jordan's patient.Patient saw Pamela Ku PA  07/09/17.Patient is scheduled to have myoview and carotid dopplers today.I spoke to Pamela Hendricks RMA who is working with Pamela Hendricks today.Clearance faxed to [facility] at fax # [phone].She will have Pamela Hendricks sign clearance and fax back to her at fax # [phone].Message sent to [facility].

## 2017-07-15 NOTE — Telephone Encounter (Signed)
F/u Message  [name] from [facility] call requesting to speak back with Pamela Hendricks with more questions. Please call back to discuss

## 2017-07-20 ENCOUNTER — Telehealth: Payer: Self-pay | Admitting: Neurology

## 2017-07-20 NOTE — Telephone Encounter (Signed)
Pt called she had a cpap titration in 2016. At that time her insurance would not pay for cpap machine. She now has a new insurance that will pay for machine. She would like for you to give her a call back.

## 2017-07-20 NOTE — Telephone Encounter (Signed)
I called patient back to get more information. Pt stated that she had a sleep study completed in 2016. Her insurance will now approve for her to get CPAP. I explinaed that since she had not been seen since 2016 she needs an apt to discuss this. I informed her that since 2 years ago from last sleep study she may have to repeat that. Pt stated " I don't want to have to go thru that again" and then asked "Cant you just use my sleep study from 2 years ago? " I informed her that decision would be made at that apt we setup. Pt was agreeable to apt and accepted a cancellation for sept 12 at 11:30 am.

## 2017-07-22 ENCOUNTER — Ambulatory Visit: Payer: Self-pay | Admitting: Neurology

## 2017-07-22 ENCOUNTER — Ambulatory Visit: Payer: Medicare Other

## 2017-07-22 NOTE — Telephone Encounter (Signed)
Pt has called today and cancelled her apt the day of.

## 2017-07-29 ENCOUNTER — Encounter: Payer: Self-pay | Admitting: Physician Assistant

## 2017-07-29 NOTE — Progress Notes (Signed)
Cardiology Office Note    Date:  07/30/2017  ID:  Kherington, Meraz 08/12/1946, MRN [id number] PCP:  Leeroy Cha, MD  Cardiologist: Reviewed with Dr. Marlou Porch last visit. Patient's late husband and late daughter followed with Dr. Doreatha Lew so patient also remembers Truitt Merle NP.   Chief Complaint: f/u testing  History of Present Illness:  MERRIE EPLER is a 71 y.o. female with history of carotid artery disease, GERD, HTN, HLD, arthritis, OSA who presents for f/u of chest pain. She was recently seen in clinic for new patient eval - she had denied formal coronary history but did report being told at a mobile screening fair that she had carotid blockages. PCP notes indicated finding of atherosclerotic changes of aorta on noninvasive imaging. Her husband was a patient of Dr. Susa Simmonds Valente David). Her daughter Florance Paolillo) was also a patient of Dr. Doreatha Lew - she had type 1 DM at age 20 and ended up having several strokes and bypass surgery in her 81s, before passing at age 58. She has been having episodic chest pain for at least 5 years with atypical features, described as brief "pull" without exertional component. She saw her PCP for this discomfort and ETT was arranged. ETT 03/31/2017 showed mildly reduced exercise tolerance, hypertensive response to exercise 216/82, otherwise normal stress ECG. Due to her decreased exercise tolerance and upcoming foot surgery, nuclear stress test was performed which was normal, EF 75% with normal wall motion, mild soft tissue attenuation. Labs by primary care 06/15/17 showed Tchol 164, trig 139, HDL 45, LDL 91, Na 139, K 4.3, CO2 29, BUN 17, Cr 0.97, LFTs wnl, Hgb 12.9, WBC 9.2, Plt 304. Carotid duplex 07/15/17 showed only 1-39% bilateral interal carotid stenosis.  She presents back for follow-up feeling great. She is not having any new symptoms. No exertional angina, dyspnea, palpitations. No heartburn. BP well controlled. Had surgery on hammer toe  and things went great. She is considering having knee replacement surgery later this year.    Past Medical History:  Diagnosis Date  . Arthritis   . Carotid artery disease ([place])    a. duplex 07/2017 - 1-39% stenosis bilaterally.  Marland Kitchen GERD (gastroesophageal reflux disease)   . Hyperlipidemia   . Hypertension   . Migraines   . OSA (obstructive sleep apnea)   . Peripheral neuropathy   . Snores   . Wears glasses     Past Surgical History:  Procedure Laterality Date  . ABDOMINAL HYSTERECTOMY  1970   Total HYST  . COLONOSCOPY    . JOINT REPLACEMENT  2001   Left Knee Replacement  . KNEE ARTHROSCOPY     lt  . WISDOM TOOTH EXTRACTION      Current Medications: Current Meds  Medication Sig  . aspirin EC 81 MG tablet Take 81 mg by mouth daily.  Marland Kitchen atorvastatin (LIPITOR) 40 MG tablet Take 1 tablet (40 mg total) by mouth daily.  . benazepril (LOTENSIN) 40 MG tablet Take 40 mg by mouth daily.   . Calcium Carbonate-Vit D-Min (CALCIUM 1200 PO) Take 1 tablet by mouth 2 (two) times daily.   . furosemide (LASIX) 40 MG tablet Take 40 mg by mouth daily.  . pantoprazole (PROTONIX) 40 MG tablet Take 40 mg by mouth daily.  . propranolol ER (INDERAL LA) 60 MG 24 hr capsule Take 1 capsule (60 mg total) by mouth at bedtime. Please call [phone] to schedule appt.     Allergies:   Patient has no known allergies.  Social History   Social History  . Marital status: Widowed    Spouse name: Laverna Peace  . Number of children: 2  . Years of education: 11   Occupational History  . retired     works partime with vendors   Social History Main Topics  . Smoking status: Never Smoker  . Smokeless tobacco: Never Used  . Alcohol use No  . Drug use: No  . Sexual activity: Not Asked   Other Topics Concern  . None   Social History Narrative   Patient is widowed. Patient is retired . 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 Patient has a 11th grade education. She has two children     Family  History:  Family History  Problem Relation Age of Onset  . Cancer Mother   . Hypertension Mother   . Hypertension Father   . Heart disease Father        MI around 46, died at 39  . Cancer Brother        Prostate Ca  . Cancer Sister        Brain Ca  . CAD Daughter        Had bypass in her 72s (type 1 diabetic), died at 69    ROS:   Please see the history of present illness.  All other systems are reviewed and otherwise negative.    PHYSICAL EXAM:   VS:  BP 126/66   Pulse 90   Ht 5\' 5"  (1.651 m)   Wt 205 lb 8 oz (93.2 kg)   SpO2 97%   BMI 34.20 kg/m   BMI: Body mass index is 34.2 kg/m. GEN: Well nourished, well developed Wf, in no acute distress  HEENT: normocephalic, atraumatic Neck: no JVD, carotid bruits, or masses Cardiac: RRR; no murmurs, rubs, or gallops, no edema  Respiratory:  clear to auscultation bilaterally, normal work of breathing GI: soft, nontender, nondistended, + BS MS: no deformity or atrophy, L foot in boot post-operatively Skin: warm and dry, no rash Neuro:  Alert and Oriented x 3, Strength and sensation are intact, follows commands Psych: euthymic mood, full affect  Wt Readings from Last 3 Encounters:  07/30/17 205 lb 8 oz (93.2 kg)  07/15/17 210 lb (95.3 kg)  07/09/17 210 lb (95.3 kg)      Studies/Labs Reviewed:   EKG:  EKG was not ordered today  Recent Labs: No results found for requested labs within last 8760 hours.   Lipid Panel No results found for: CHOL, TRIG, HDL, CHOLHDL, VLDL, LDLCALC, LDLDIRECT  Additional studies/ records that were reviewed today include: Summarized above.    ASSESSMENT & PLAN:   1. Atypical chest pain - both ETT and nuclear stress test reassuring. May represent MSK or neuropathic pain. Continue risk factor modification with aspirin, statin, BP control. Discussed warning sx with patient. If she should require knee surgery in the next 6 months and has no interim development of new symptoms, would consider  her cleared from CV perspective to proceed. 2. Carotid artery disease - mild by recent duplex. Anticipate surveillance every 1-2 years, further timing at the discretion of primary cardiologist. Continue statin. 3. Essential HTN - BP well controlled. 4. Hyperlipidemia - continue statin. Lipids are followed by PCP.  Disposition: F/u with Dr. Marlou Porch in 1 year.   Medication Adjustments/Labs and Tests Ordered: Current medicines are reviewed at length with the patient today.  Concerns regarding medicines are outlined above. Medication changes, Labs and Tests ordered  today are summarized above and listed in the Patient Instructions accessible in Encounters.   Signed, Charlie Pitter, PA-C  07/30/2017 8:23 AM    [address], [place], [place]  [address] Phone: [phone]; Fax: [phone]

## 2017-07-30 ENCOUNTER — Encounter: Payer: Self-pay | Admitting: Physician Assistant

## 2017-07-30 ENCOUNTER — Ambulatory Visit (INDEPENDENT_AMBULATORY_CARE_PROVIDER_SITE_OTHER): Payer: Medicare Other | Admitting: Physician Assistant

## 2017-07-30 VITALS — BP 126/66 | HR 90 | Ht 65.0 in | Wt 205.5 lb

## 2017-07-30 DIAGNOSIS — E785 Hyperlipidemia, unspecified: Secondary | ICD-10-CM

## 2017-07-30 DIAGNOSIS — I10 Essential (primary) hypertension: Secondary | ICD-10-CM

## 2017-07-30 DIAGNOSIS — I779 Disorder of arteries and arterioles, unspecified: Secondary | ICD-10-CM

## 2017-07-30 DIAGNOSIS — R0789 Other chest pain: Secondary | ICD-10-CM | POA: Diagnosis not present

## 2017-07-30 DIAGNOSIS — I739 Peripheral vascular disease, unspecified: Secondary | ICD-10-CM

## 2017-07-30 NOTE — Patient Instructions (Signed)
Medication Instructions:  Your physician recommends that you continue on your current medications as directed. Please refer to the Current Medication list given to you today.  Labwork: None ordered  Testing/Procedures: None ordered  Follow-Up: Your physician wants you to follow-up in: 1 YEAR WITH DR. SKAINS   You will receive a reminder letter in the mail two months in advance. If you don't receive a letter, please call our office to schedule the follow-up appointment.    Any Other Special Instructions Will Be Listed Below (If Applicable).    If you need a refill on your cardiac medications before your next appointment, please call your pharmacy.   

## 2017-08-06 ENCOUNTER — Ambulatory Visit
Admission: RE | Admit: 2017-08-06 | Discharge: 2017-08-06 | Disposition: A | Discharge status: Home / Self Care | Payer: Medicare Other | Source: Ambulatory Visit | Attending: Internal Medicine | Admitting: Internal Medicine

## 2017-08-06 DIAGNOSIS — Z1231 Encounter for screening mammogram for malignant neoplasm of breast: Secondary | ICD-10-CM

## 2017-08-07 ENCOUNTER — Telehealth: Payer: Self-pay | Admitting: Physician Assistant

## 2017-08-07 NOTE — Telephone Encounter (Signed)
Returned pts call and advised her the reason she has to come in for lab work is because at her o/v 07/09/17, her Simvastatin was d/c'd and she was started on Atorvastatin, and we needed to check and make sure it was having the same affect on her numbers. Pt verbalized understanding and said she will be here.

## 2017-08-07 NOTE — Telephone Encounter (Signed)
New message     Pt is calling to ask if she needs to come in for the lab work on 08/17/17. She said that at her last visit she was told she was ok so she doesn't see why she needs to come in. Please call.

## 2017-08-17 ENCOUNTER — Other Ambulatory Visit: Payer: Medicare Other | Admitting: *Deleted

## 2017-08-17 ENCOUNTER — Encounter (INDEPENDENT_AMBULATORY_CARE_PROVIDER_SITE_OTHER): Payer: Self-pay

## 2017-08-17 DIAGNOSIS — I739 Peripheral vascular disease, unspecified: Secondary | ICD-10-CM

## 2017-08-17 DIAGNOSIS — I779 Disorder of arteries and arterioles, unspecified: Secondary | ICD-10-CM

## 2017-08-17 DIAGNOSIS — E785 Hyperlipidemia, unspecified: Secondary | ICD-10-CM

## 2017-08-17 LAB — HEPATIC FUNCTION PANEL
ALK PHOS: 95 IU/L (ref 39–117)
ALT: 31 IU/L (ref 0–32)
AST: 25 IU/L (ref 0–40)
Albumin: 4.3 g/dL (ref 3.5–4.8)
BILIRUBIN TOTAL: 0.5 mg/dL (ref 0.0–1.2)
BILIRUBIN, DIRECT: 0.14 mg/dL (ref 0.00–0.40)
TOTAL PROTEIN: 6.6 g/dL (ref 6.0–8.5)

## 2017-08-17 LAB — LIPID PANEL
CHOL/HDL RATIO: 3.1 ratio (ref 0.0–4.4)
Cholesterol, Total: 168 mg/dL (ref 100–199)
HDL: 54 mg/dL (ref >39)
LDL Calculated: 95 mg/dL (ref 0–99)
TRIGLYCERIDES: 96 mg/dL (ref 0–149)
VLDL Cholesterol Cal: 19 mg/dL (ref 5–40)

## 2017-08-19 ENCOUNTER — Telehealth: Payer: Self-pay | Admitting: Physician Assistant

## 2017-08-19 DIAGNOSIS — E785 Hyperlipidemia, unspecified: Secondary | ICD-10-CM

## 2017-08-19 MED ORDER — ROSUVASTATIN CALCIUM 20 MG PO TABS: 20.0000 mg | ORAL_TABLET | Freq: Every day | ORAL | 11 refills | Status: DC

## 2017-08-19 NOTE — Telephone Encounter (Signed)
Would recommend switching to Crestor 20mg  daily with f/u lipid panel in 3 months. If pt develops side effects on this, would decrease dose to 10mg  daily.

## 2017-08-19 NOTE — Telephone Encounter (Signed)
Patient complains of ABD pain since she started atorvastatin 8/30.  She reports it is a "funny pain" the occurs mostly at night. She also has had light spotting for the last 2 days. Confirmed with patient she is taking Lipitor with food. Instructed her to consult PCP about spotting. She has only tried simvastatin (which she tolerated but her LDL was not at goal) and Lipitor (which she now wants to change).   She understands she will be called with recommendations from [facility].

## 2017-08-19 NOTE — Telephone Encounter (Signed)
New Message     Pt c/o medication issue:  1. Name of Medication:  atorvastatin (LIPITOR) 40 MG tablet Take 1 tablet (40 mg total) by mouth daily.     2. How are you currently taking this medication (dosage and times per day)?  1 tab a day  3. Are you having a reaction (difficulty breathing--STAT)? Yes   4. What is your medication issue? Having stomach issues and she is having spotting

## 2017-08-19 NOTE — Telephone Encounter (Signed)
Instructed patient to STOP LIPITOR and START CRESTOR 20 mg daily. FLP and LFTs scheduled 1/7. Patient agrees with treatment plan and will call PCP if symptoms persist. She was grateful for call.

## 2017-08-27 ENCOUNTER — Ambulatory Visit (INDEPENDENT_AMBULATORY_CARE_PROVIDER_SITE_OTHER): Payer: Self-pay

## 2017-08-27 VITALS — BP 136/78 | Ht 66.0 in | Wt 214.5 lb

## 2017-08-27 DIAGNOSIS — G4733 Obstructive sleep apnea (adult) (pediatric): Secondary | ICD-10-CM | POA: Insufficient documentation

## 2017-08-27 DIAGNOSIS — G4719 Other hypersomnia: Secondary | ICD-10-CM | POA: Insufficient documentation

## 2017-08-27 NOTE — Progress Notes (Signed)
PATIENT: Pamela Hendricks DOB: 16-Nov-1945  REASON FOR VISIT: Needs Cpap machine  HISTORY: Pamela Hendricks is a 71 year old female with history of obstructive sleep apnea. She had sleep study in 2016 which showed an AHI of 14.5 hour. She returned for a Cpap titration and was titrated to 8 cm. She chose not to get set up at that time on cpap. She is here today to get that started.  She knows  how important it is to treat her sleep apnea.  Since the last visit the patient denies any new symptoms. She still has daytime sleepiness and fatique. Continues to snore and wake up at night. She has gained 10lbs since her last sleep study.  REVIEW OF SYSTEMS: Out of a complete 14 system review of symptoms, the patient complains only of the following symptoms, and all other reviewed systems are negative.  Epworth Sleepiness score is endorsed at 15 points- high, snoring, untreated apnea.   ALLERGIES: No Known Allergies  HOME MEDICATIONS: Outpatient Medications Prior to Visit  Medication Sig Dispense Refill  . aspirin EC 81 MG tablet Take 81 mg by mouth daily.    . benazepril (LOTENSIN) 40 MG tablet Take 40 mg by mouth daily.     . Calcium Carbonate-Vit D-Min (CALCIUM 1200 PO) Take 1 tablet by mouth 2 (two) times daily.     . furosemide (LASIX) 40 MG tablet Take 40 mg by mouth daily.    . pantoprazole (PROTONIX) 40 MG tablet Take 40 mg by mouth daily.    . propranolol ER (INDERAL LA) 60 MG 24 hr capsule Take 1 capsule (60 mg total) by mouth at bedtime. Please call [phone] to schedule appt. 30 capsule 2  . rosuvastatin (CRESTOR) 20 MG tablet Take 1 tablet (20 mg total) by mouth daily. 30 tablet 11   No facility-administered medications prior to visit.       PAST SURGICAL HISTORY: Past Surgical History:  Procedure Laterality Date  . ABDOMINAL HYSTERECTOMY  1970   Total HYST  . BREAST BIOPSY Bilateral 2001   benign  . BREAST EXCISIONAL BIOPSY Right 2013   sclerosing lesion  . COLONOSCOPY    .  JOINT REPLACEMENT  2001   Left Knee Replacement  . KNEE ARTHROSCOPY     lt  . WISDOM TOOTH EXTRACTION      SOCIAL HISTORY: Social History   Social History  . Marital status: Widowed    Spouse name: Pamela Hendricks  . Number of children: 2  . Years of education: 11   Occupational History  . retired     works partime with vendors   Social History Main Topics  . Smoking status: Never Smoker  . Smokeless tobacco: Never Used  . Alcohol use No  . Drug use: No  . Sexual activity: Not on file   Other Topics Concern  . Not on file   Social History Narrative   Patient is widowed. Patient is retired . 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 Patient has a 11th grade education. She has two children      PHYSICAL EXAM  Vitals:   08/27/17 1117  BP: 136/78  Weight: 214 lb 8 oz (97.3 kg)  Height: 5\' 6"  (1.676 m)    Body mass index is 34.62 kg/m.  Generalized: Well developed, in no acute distress  HEENT: neck circumference Mallampati CHEST: Lungs clear to auscultation bilaterally     ASSESSMENT AND PLAN 71 y.o. year old female  has a  past medical history of Arthritis; Carotid artery disease ([place]); GERD (gastroesophageal reflux disease); Hyperlipidemia; Hypertension; Migraines; OSA (obstructive sleep apnea); Peripheral neuropathy; Snores; and Wears glasses. here with:  1.OSA untreated  2. Will get order sent to DME company in [place], [place] where patient lives. We can use her previous Sleep study from 2016 to qualify .Will order the mask which the technician used during her Cpap titration (Res Med P10 small).  3. A follow-up appointment will be made in 60 days for CPAP compliance with Pamela Hendricks, Sleep lab manager.   I spent 15 minutes with the patient, 50 % of this time was spent reviewing CPAP download and discussing risks associated with untreated sleep apnea. These include atrial fibrillation, embolic stroke, MI and PE.   Pamela Hendricks, RPSGT 08/27/2017, 11:20  AM [facility] [address], [place], [place] [address] [phone]   Cc Dr. Larey Seat, MD

## 2017-10-07 ENCOUNTER — Other Ambulatory Visit: Payer: Self-pay | Admitting: Neurology

## 2017-10-07 ENCOUNTER — Telehealth: Payer: Self-pay | Admitting: Neurology

## 2017-10-07 DIAGNOSIS — G4733 Obstructive sleep apnea (adult) (pediatric): Secondary | ICD-10-CM

## 2017-10-07 NOTE — Telephone Encounter (Signed)
Called the patient to make her aware that we would need to change her apt on dec 6th as the provider would not be able to be here. When looking to see where to put the patient we came across that the patient was never set up for CPAP. It appears that per the last discussion in oct with robin the plan was to try and get the patient set up with a company. The order was never updated and there fore never processed. I informed the patient of this and apologized and explained that we would get orders sent out today and see if the study from 2016 would still approve her to get a CPAP machine. I have forwarded the order to [employer] and they will let me know if this will be an issue. In the meantime I went ahead and scheduled the patient for a follow up in feb with Cecille Rubin NP in hopes that she will get set up. I have went over the CPAP compliance requirements. pt would like me to confirm that this will be discussed with Shirlean Mylar she was very confused thinking that something was suppose to be done at this apt on dec 6th. I informed her that based off of notes the follow up apt on that date was due to the patient needing to follow up with CPAP but since the patient has not started CPAP we can look at getting her set up and started. The patient is really wanting to make sure that she gets set up with CPAP before the end of year. I informed her I would touch base with Shirlean Mylar and make sure I am doing right and I explained to her that we will do our best to help her get set up. Pt verbalized understanding. Pt had no questions at this time but was encouraged to call back if questions arise.

## 2017-10-08 ENCOUNTER — Other Ambulatory Visit: Payer: Self-pay | Admitting: Neurology

## 2017-10-08 DIAGNOSIS — G4733 Obstructive sleep apnea (adult) (pediatric): Secondary | ICD-10-CM

## 2017-10-08 NOTE — Telephone Encounter (Signed)
Yes you are correct. Everything should go through just fine. She needs the company in [place]. Not sure how this got missed.

## 2017-10-08 NOTE — Telephone Encounter (Signed)
Spoke with patient and explained that I did not realize she had [employer] medicare. She will need a new sleep study. I explained that we can do an HST to qualify her. She is coming in Friday 10-09-17 to pick up and will bring back Monday morning. We will send script to [facility] in [place] since patient lives there. I will not charge her for this study. She was very thankful.

## 2017-10-12 ENCOUNTER — Other Ambulatory Visit: Payer: Self-pay | Admitting: Neurology

## 2017-10-12 DIAGNOSIS — G4733 Obstructive sleep apnea (adult) (pediatric): Secondary | ICD-10-CM

## 2017-10-12 DIAGNOSIS — J323 Chronic sphenoidal sinusitis: Secondary | ICD-10-CM

## 2017-10-12 DIAGNOSIS — G4719 Other hypersomnia: Secondary | ICD-10-CM

## 2017-10-12 NOTE — Addendum Note (Signed)
Addended by: Larey Seat on: 10/12/2017 04:09 PM   Modules accepted: Orders

## 2017-10-12 NOTE — Telephone Encounter (Signed)
Did not know we had not already done this.

## 2017-10-12 NOTE — Procedures (Signed)
[facility] [place], [place] [address] NAME:   Pamela Hendricks                                                     DOB: May 12, 1946 MEDICAL RECORD No:   [id number]                                 DOS: 10/12/2017 STUDY PERFORMED: HST on Watch Pat Referring Physician: not stated HISTORY: [name] is a 71 year old female patient with history of obstructive sleep apnea. She had undergone a sleep study in 2016 which showed an AHI of 14.5/ hour. She returned for CPAP, was titrated to 8 cm H20 and chose not to get set up at that time on CPAP. She returned more than 2 years later to get CPAP started. She still has daytime sleepiness and fatigue. Continues to snore and wake up at night. She has gained 10lbs since her last sleep study. The patient is on pain medication.  Epworth Sleepiness score is endorsed at 15 points- plus snoring, untreated OSA, weight gain.  BMI is not stated.  STUDY RESULTS: Total Recording Time: 9 hours 23 minutes Total Apnea/Hypopnea Index (AHI): 36.5 /hour Average Oxygen Saturation:  95% Lowest Oxygen Saturation: 74%  Total Time Oxygen Saturation below 89%:  13.3 minutes, 2.4 % Average Heart Rate:  79 bpm (between 41 and 107 bpm )  IMPRESSION: Moderately severe OSA, without significant hypoxemia and with clinically significant cardiac responses ( bradycardia , sinus rhythm) . RECOMMENDATION: CPAP auto-titration 5-15 cm water, 2 cm EPR and interface of patient's choice, under heated humidity.   I certify that I have reviewed the raw data recording prior to the issuance of this report in accordance with the standards of the American Academy of Sleep Medicine (AASM). Larey Seat, M.D.     10-12-2017   Medical Director of [place] [facility] at [facility],  [place] of the ABPN and ABSM and accredited by the AASM

## 2017-10-13 NOTE — Telephone Encounter (Signed)
At that time , I did not know she would need a home study. I will get someone to help me with this.

## 2017-10-13 NOTE — Telephone Encounter (Signed)
She just had it !!!

## 2017-10-14 NOTE — Progress Notes (Signed)
Unable to get supplies, will need Home Sleep test

## 2017-10-15 ENCOUNTER — Ambulatory Visit: Payer: Medicare Other

## 2017-11-16 ENCOUNTER — Other Ambulatory Visit: Payer: Medicare Other | Admitting: *Deleted

## 2017-11-16 ENCOUNTER — Telehealth: Payer: Self-pay | Admitting: *Deleted

## 2017-11-16 DIAGNOSIS — E785 Hyperlipidemia, unspecified: Secondary | ICD-10-CM

## 2017-11-16 DIAGNOSIS — Z79899 Other long term (current) drug therapy: Secondary | ICD-10-CM

## 2017-11-16 LAB — HEPATIC FUNCTION PANEL
ALBUMIN: 4.3 g/dL (ref 3.5–4.8)
ALT: 35 IU/L — ABNORMAL HIGH (ref 0–32)
AST: 28 IU/L (ref 0–40)
Alkaline Phosphatase: 74 IU/L (ref 39–117)
BILIRUBIN TOTAL: 0.4 mg/dL (ref 0.0–1.2)
Bilirubin, Direct: 0.14 mg/dL (ref 0.00–0.40)
TOTAL PROTEIN: 6.2 g/dL (ref 6.0–8.5)

## 2017-11-16 LAB — LIPID PANEL
CHOL/HDL RATIO: 2.5 ratio (ref 0.0–4.4)
Cholesterol, Total: 124 mg/dL (ref 100–199)
HDL: 49 mg/dL (ref >39)
LDL CALC: 57 mg/dL (ref 0–99)
TRIGLYCERIDES: 89 mg/dL (ref 0–149)
VLDL CHOLESTEROL CAL: 18 mg/dL (ref 5–40)

## 2017-11-16 NOTE — Telephone Encounter (Signed)
-----   Message from Charlie Pitter, [place] sent at 11/16/2017  4:01 PM EST ----- Please let patient know LDL has improved on Crestor. ALT is 3 points above normal which is still OK (we do not typically adjust therapy until this reaches 3x the normal limit). However, would like to trend just to make sure this does not represent any new process - recommend follow-up hepatic function panel in 7-10 days. [name] PA-C

## 2017-11-17 ENCOUNTER — Telehealth: Payer: Self-pay | Admitting: Physician Assistant

## 2017-11-17 NOTE — Telephone Encounter (Signed)
Returned pts call and discussed her lab results / med change. See result note.

## 2017-11-17 NOTE — Telephone Encounter (Signed)
-----   Message from Charlie Pitter, [place] sent at 11/16/2017  4:26 PM EST ----- It can cause abdominal pain but this is not a very frequent side effect. It looks like when Pamela Hendricks from lipid clinic had contacted her previously she had indicated stomach pain while on atorvastatin, so it does not seem the pain is specific to Crestor alone. We can try reducing dose further to Crestor 10mg  daily to see if it helps but I would also recommend that she follow up with her PCP to discuss alternative causes as well. [name] PA-C

## 2017-11-17 NOTE — Telephone Encounter (Signed)
Follow Up:     Returning your call,cocnerning her lab results.

## 2017-11-23 ENCOUNTER — Other Ambulatory Visit: Payer: Medicare Other

## 2017-11-25 ENCOUNTER — Other Ambulatory Visit: Payer: Medicare Other

## 2017-11-27 ENCOUNTER — Other Ambulatory Visit: Payer: Medicare Other | Admitting: *Deleted

## 2017-11-27 DIAGNOSIS — Z79899 Other long term (current) drug therapy: Secondary | ICD-10-CM

## 2017-11-27 LAB — HEPATIC FUNCTION PANEL
ALK PHOS: 76 IU/L (ref 39–117)
ALT: 40 IU/L — ABNORMAL HIGH (ref 0–32)
AST: 30 IU/L (ref 0–40)
Albumin: 4.5 g/dL (ref 3.5–4.8)
Bilirubin Total: 0.3 mg/dL (ref 0.0–1.2)
Bilirubin, Direct: 0.11 mg/dL (ref 0.00–0.40)
TOTAL PROTEIN: 6.5 g/dL (ref 6.0–8.5)

## 2017-11-30 ENCOUNTER — Telehealth: Payer: Self-pay | Admitting: Physician Assistant

## 2017-11-30 DIAGNOSIS — Z79899 Other long term (current) drug therapy: Secondary | ICD-10-CM

## 2017-11-30 MED ORDER — ROSUVASTATIN CALCIUM 10 MG PO TABS: 10.0000 mg | ORAL_TABLET | Freq: Every day | ORAL | 3 refills | Status: DC

## 2017-11-30 NOTE — Telephone Encounter (Signed)
New message ° °Pt verbalized that she is returning call for RN °

## 2017-11-30 NOTE — Telephone Encounter (Signed)
-----   Message from Charlie Pitter, PA-C sent at 11/28/2017  7:00 AM EST ----- ALT remains minimally elevated. Again no cause for acute concern but does not seem to have moved much. Please cut Crestor in 1/2 and recheck LFTs in 2 weeks. If still abnormal at that time would need to see PCP if she hasn't already. [name] PA-C

## 2017-12-14 ENCOUNTER — Other Ambulatory Visit: Payer: Medicare Other | Admitting: *Deleted

## 2017-12-14 DIAGNOSIS — Z79899 Other long term (current) drug therapy: Secondary | ICD-10-CM

## 2017-12-14 LAB — HEPATIC FUNCTION PANEL
ALBUMIN: 4 g/dL (ref 3.5–4.8)
ALK PHOS: 82 IU/L (ref 39–117)
ALT: 34 IU/L — ABNORMAL HIGH (ref 0–32)
AST: 27 IU/L (ref 0–40)
BILIRUBIN TOTAL: 0.3 mg/dL (ref 0.0–1.2)
Bilirubin, Direct: 0.13 mg/dL (ref 0.00–0.40)
Total Protein: 6.1 g/dL (ref 6.0–8.5)

## 2017-12-28 ENCOUNTER — Ambulatory Visit: Payer: Self-pay | Admitting: Nurse Practitioner

## 2017-12-31 ENCOUNTER — Other Ambulatory Visit: Payer: Self-pay | Admitting: Internal Medicine

## 2017-12-31 DIAGNOSIS — Z139 Encounter for screening, unspecified: Secondary | ICD-10-CM

## 2018-01-26 NOTE — Progress Notes (Signed)
[facility]  PATIENT: ERIAL FIKES DOB: 1946-07-30   REASON FOR VISIT: Follow-up for obstructive sleep apnea with initial CPAP compliance HISTORY FROM: Patient    HISTORY OF PRESENT ILLNESS:UPDATE 3/21/2019CM Ms. Buzan, 72 year old female returns for follow-up with history of obstructive sleep apnea here for initial CPAP compliance.  She was diagnosed back in 2016 but never returned for CPAP titration.  Compliance data dated 12/29/2017 to 01/27/2018 shows compliance greater than 4 hours 87%.  Average usage 5 hours 48 minutes.  Set pressure 5-15 cm.  EPR 3.  AHI 6.3.  ESS 4.  Patient complains that her mask makes her break out.  Otherwise she is satisfied with the CPAP and feels much less fatigue.  She returns for reevaluation   Ms.Wiater is a 72 year old female with history of obstructive sleep apnea. She had sleep study in 2016 which showed an AHI of 14.5 hour. She returned for a Cpap titration and was titrated to 8 cm. She chose not to get set up at that time on cpap. She is here today to get that started.  She knows  how important it is to treat her sleep apnea.  Since the last visit the patient denies any new symptoms. She still has daytime sleepiness and fatique. Continues to snore and wake up at night. She has gained 10lbs since her last sleep study.   REVIEW OF SYSTEMS: Full 14 system review of systems performed and notable only for those listed, all others are neg:  Constitutional: neg  Cardiovascular: neg Ear/Nose/Throat: neg  Skin: neg Eyes: neg Respiratory: neg Gastroitestinal: neg  Hematology/Lymphatic: Easy bruising Endocrine: neg Musculoskeletal: Joint pain Allergy/Immunology: Environmental allergies Neurological: neg Psychiatric: neg Sleep : Obstructive sleep apnea with CPAP   ALLERGIES: No Known Allergies  HOME MEDICATIONS: Outpatient Medications Prior to Visit  Medication Sig Dispense Refill  . aspirin EC 81 MG tablet Take 81 mg by  mouth daily.    Marland Kitchen atorvastatin (LIPITOR) 10 MG tablet 10 mg daily.  1  . benazepril (LOTENSIN) 40 MG tablet Take 40 mg by mouth daily.     . Calcium Carbonate-Vit D-Min (CALCIUM 1200 PO) Take 1 tablet by mouth 2 (two) times daily.     . furosemide (LASIX) 40 MG tablet Take 40 mg by mouth daily.    . nortriptyline (PAMELOR) 50 MG capsule 50 mg. 01/28/18 one every night  1  . pantoprazole (PROTONIX) 40 MG tablet Take 40 mg by mouth daily.    . propranolol ER (INDERAL LA) 60 MG 24 hr capsule Take 1 capsule (60 mg total) by mouth at bedtime. Please call [phone] to schedule appt. 30 capsule 2  . rosuvastatin (CRESTOR) 10 MG tablet Take 1 tablet (10 mg total) by mouth daily. 90 tablet 3   No facility-administered medications prior to visit.     PAST MEDICAL HISTORY: Past Medical History:  Diagnosis Date  . Arthritis   . Carotid artery disease ([place])    a. duplex 07/2017 - 1-39% stenosis bilaterally.  Marland Kitchen GERD (gastroesophageal reflux disease)   . Hyperlipidemia   . Hypertension   . Migraines   . OSA (obstructive sleep apnea)    CPAP  . Peripheral neuropathy   . Snores   . Wears glasses     PAST SURGICAL HISTORY: Past Surgical History:  Procedure Laterality Date  . ABDOMINAL HYSTERECTOMY  1970   Total HYST  . BREAST BIOPSY Bilateral 2001   benign  . BREAST EXCISIONAL BIOPSY Right 2013  sclerosing lesion  . COLONOSCOPY    . JOINT REPLACEMENT  2001   Left Knee Replacement  . KNEE ARTHROSCOPY     lt  . WISDOM TOOTH EXTRACTION      FAMILY HISTORY: Family History  Problem Relation Age of Onset  . Cancer Mother   . Hypertension Mother   . Hypertension Father   . Heart disease Father        MI around 84, died at 77  . Cancer Brother        Prostate Ca  . Cancer Sister        Brain Ca  . CAD Daughter        Had bypass in her 3s (type 1 diabetic), died at 61    SOCIAL HISTORY: Social History   Socioeconomic History  . Marital status: Widowed    Spouse name: Laverna Peace  .  Number of children: 2  . Years of education: 74  . Highest education level: Not on file  Occupational History  . Occupation: retired    Comment: works partime with vendors  Social Needs  . Financial resource strain: Not on file  . Food insecurity:    Worry: Not on file    Inability: Not on file  . Transportation needs:    Medical: Not on file    Non-medical: Not on file  Tobacco Use  . Smoking status: Never Smoker  . Smokeless tobacco: Never Used  Substance and Sexual Activity  . Alcohol use: No  . Drug use: No  . Sexual activity: Not on file  Lifestyle  . Physical activity:    Days per week: Not on file    Minutes per session: Not on file  . Stress: Not on file  Relationships  . Social connections:    Talks on phone: Not on file    Gets together: Not on file    Attends religious service: Not on file    Active member of club or organization: Not on file    Attends meetings of clubs or organizations: Not on file    Relationship status: Not on file  . Intimate partner violence:    Fear of current or ex partner: Not on file    Emotionally abused: Not on file    Physically abused: Not on file    Forced sexual activity: Not on file  Other Topics Concern  . Not on file  Social History Narrative   Patient is widowed. Patient is retired . 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 Patient has a 11th grade education. She has two children     PHYSICAL EXAM  Vitals:   01/28/18 1042  BP: 108/65  Pulse: 72  Weight: 211 lb 3.2 oz (95.8 kg)   Body mass index is 34.09 kg/m.  Generalized: Well developed, obese female in no acute distress  Head: normocephalic and atraumatic,. Oropharynx benign mallopatti 4 Neck: Supple, circumference 15   Musculoskeletal: No deformity   Neurological examination   Mentation: Alert oriented to time, place, history taking. Attention span and concentration appropriate. Recent and remote memory intact.  Follows all commands speech  and language fluent. ESS 4  Cranial nerve II-XII: Pupils were equal round reactive to light extraocular movements were full, visual field were full on confrontational test. Facial sensation and strength were normal. hearing was intact to finger rubbing bilaterally. Uvula tongue midline. head turning and shoulder shrug were normal and symmetric.Tongue protrusion into cheek strength was normal. Motor: normal bulk  and tone, full strength in the BUE, BLE, Sensory: normal and symmetric to light touch,   Coordination: finger-nose-finger, heel-to-shin bilaterally, no dysmetria Gait and Station: Rising up from seated position without assistance, normal stance,  moderate stride, good arm swing, smooth turning, able to perform tiptoe, and heel walking without difficulty. Tandem gait is steady  DIAGNOSTIC DATA (LABS, IMAGING, TESTING) - I reviewed patient records, labs, notes, testing and imaging myself where available.  Lab Results  Component Value Date   WBC 10.2 11/22/2014   HGB 13.9 11/22/2014   HCT 41.1 11/22/2014   MCV 92 11/22/2014   PLT 393 (H) 11/22/2014      Component Value Date/Time   NA 139 11/22/2014 1050   K 4.5 11/22/2014 1050   CL 98 11/22/2014 1050   CO2 20 11/22/2014 1050   GLUCOSE 101 (H) 11/22/2014 1050   GLUCOSE 125 (H) 09/01/2014 1019   BUN 11 11/22/2014 1050   CREATININE 0.67 11/22/2014 1050   CALCIUM 9.7 11/22/2014 1050   PROT 6.1 12/14/2017 1145   ALBUMIN 4.0 12/14/2017 1145   AST 27 12/14/2017 1145   ALT 34 (H) 12/14/2017 1145   ALKPHOS 82 12/14/2017 1145   BILITOT 0.3 12/14/2017 1145   GFRNONAA 90 11/22/2014 1050   GFRAA 104 11/22/2014 1050   Lab Results  Component Value Date   CHOL 124 11/16/2017   HDL 49 11/16/2017   LDLCALC 57 11/16/2017   TRIG 89 11/16/2017   CHOLHDL 2.5 11/16/2017    Lab Results  Component Value Date   TSH 2.560 11/22/2014      ASSESSMENT AND PLAN  72 y.o. year old female  has a past medical history of Arthritis, Carotid  artery disease ([place]), GERD (gastroesophageal reflux disease), Hyperlipidemia, Hypertension, Migraines, OSA (obstructive sleep apnea), Peripheral neuropathy, here to follow-up for initial CPAP titration.Data dated 12/29/2017 to 01/27/2018 shows compliance greater than 4 hours 87%.  Average usage 5 hours 48 minutes.  Set pressure 5-15 cm.  EPR 3.  AHI 6.3.  ESS 4.  Patient complains that her mask makes her break out.  Otherwise she is satisfied with the CPAP and feels much less fatigue.    PLAN: CPAP compliance 87% greater than 4 hours reviewed compliance data with patient AHI is high at 6.3 will increase max pressure to 16 Patient will get a liner for the mask due to skin reaction Follow-up in 4 months for repeat compliance  I spent 15 minutes in total face to face time with the patient more than 50% of which was spent counseling and coordination of care, reviewing test results reviewing medications and discussing and reviewing the diagnosis of obstructive sleep apnea and her compliance report.  Made aware of changes to pressure. Dennie Bible, [facility], [facility], APRN  [facility] [address] [place], [place] [address] [phone]

## 2018-01-27 ENCOUNTER — Encounter: Payer: Self-pay | Admitting: Nurse Practitioner

## 2018-01-28 ENCOUNTER — Encounter: Payer: Self-pay | Admitting: Nurse Practitioner

## 2018-01-28 ENCOUNTER — Telehealth: Payer: Self-pay | Admitting: Nurse Practitioner

## 2018-01-28 ENCOUNTER — Ambulatory Visit (INDEPENDENT_AMBULATORY_CARE_PROVIDER_SITE_OTHER): Payer: Medicare Other | Admitting: Nurse Practitioner

## 2018-01-28 DIAGNOSIS — Z9989 Dependence on other enabling machines and devices: Secondary | ICD-10-CM

## 2018-01-28 DIAGNOSIS — G4733 Obstructive sleep apnea (adult) (pediatric): Secondary | ICD-10-CM | POA: Diagnosis not present

## 2018-01-28 NOTE — Telephone Encounter (Signed)
CPAP DME order for mask liner successfully faxed to [facility].

## 2018-01-28 NOTE — Progress Notes (Signed)
CPAP DME order for mask liner successfully faxed to [facility].

## 2018-01-28 NOTE — Telephone Encounter (Signed)
Pt called stating she got her supplies through [facility]. FYI

## 2018-01-28 NOTE — Patient Instructions (Signed)
CPAP compliance 87% greater than 4 hours AHI is high at 6.3 will increase max pressure to 16 Patient will get a liner for the mask due to skin reaction Follow-up in 4 months for repeat compliance

## 2018-02-04 NOTE — Telephone Encounter (Signed)
aquaphor ointment would be good. Also NS nasal spray.

## 2018-02-04 NOTE — Telephone Encounter (Signed)
Spoke with patient who stated she received the CPAP mask liner. She stated the liner is not working at all. She stated it is too flimsy to use. She then stated she has had sores just inside her nostrils for " a while". She has not tried any ointments for this. She does have moisture with her CPAP.  She asked if she could apply Vaseline or Vicks inside her nostrils. This RN discussed using Aquaphor or OTC ointment that is not medicated or scented. The patient asked if there wasn't a prescription medication she could apply. This RN stated she is unaware of any prescription ointment. This RN advised will discuss with NP and call her back. She verbalized understanding, appreciation.

## 2018-02-04 NOTE — Telephone Encounter (Signed)
Pt called requesting a call back to discuss her CPAP. Stating the machine has been irration her nose and has caused a sore. Please call to advise

## 2018-02-04 NOTE — Telephone Encounter (Signed)
Spoke with patient and advised her Pamela Hendricks recommends Aquaphor and NS nasal spray. Patient stated she already uses NS spray to help with her allergies. This RN suggested she apply NS then light coating of Aquaphor just inside nostrils. Advised her it is OTC. She stated she would pick some up, verbalized understanding, appreciation.

## 2018-05-31 NOTE — Progress Notes (Signed)
[facility]  PATIENT: Pamela Hendricks DOB: Mar 13, 1946   REASON FOR VISIT: Follow-up for obstructive sleep apnea with  CPAP compliance HISTORY FROM: Patient    HISTORY OF PRESENT ILLNESS:UPDATE 7/20/2019CM Pamela Hendricks, 72 year old female returns for follow-up with a history of obstructive sleep apnea CPAP she is doing well with her CPAP however her velcro straps on her mask no longer connect.  Compliance data dated 05/03/2017-06/01/2018 shows compliance greater than 4 hours 87%.  Average usage 5 hours 37 minutes.  Set pressure 5 to 16 cm leak 95th percentile 8.3 AHI 6.4.  She returns for reevaluation    UPDATE 3/21/2019CM Pamela Hendricks, 72 year old female returns for follow-up with history of obstructive sleep apnea here for initial CPAP compliance.  She was diagnosed back in 2016 but never returned for CPAP titration.  Compliance data dated 12/29/2017 to 01/27/2018 shows compliance greater than 4 hours 87%.  Average usage 5 hours 48 minutes.  Set pressure 5-15 cm.  EPR 3.  AHI 6.3.  ESS 4.  Patient complains that her mask makes her break out.  Otherwise she is satisfied with the CPAP and feels much less fatigue.  She returns for reevaluation   Pamela Hendricks is a 72 year old female with history of obstructive sleep apnea. She had sleep study in 2016 which showed an AHI of 14.5 hour. She returned for a Cpap titration and was titrated to 8 cm. She chose not to get set up at that time on cpap. She is here today to get that started.  She knows  how important it is to treat her sleep apnea.  Since the last visit the patient denies any new symptoms. She still has daytime sleepiness and fatique. Continues to snore and wake up at night. She has gained 10lbs since her last sleep study.   REVIEW OF SYSTEMS: Full 14 system review of systems performed and notable only for those listed, all others are neg:  Constitutional: neg  Cardiovascular: neg Ear/Nose/Throat: neg  Skin: neg Eyes:  neg Respiratory: neg Gastroitestinal: neg  Hematology/Lymphatic: Easy bruising Endocrine: neg Musculoskeletal: Joint pain Allergy/Immunology: Environmental allergies Neurological: neg Psychiatric: neg Sleep : Obstructive sleep apnea with CPAP   ALLERGIES: No Known Allergies  HOME MEDICATIONS: Outpatient Medications Prior to Visit  Medication Sig Dispense Refill  . aspirin EC 81 MG tablet Take 81 mg by mouth daily.    Marland Kitchen atorvastatin (LIPITOR) 10 MG tablet 10 mg daily.  1  . benazepril (LOTENSIN) 40 MG tablet Take 40 mg by mouth daily.     . Calcium Carbonate-Vit D-Min (CALCIUM 1200 PO) Take 1 tablet by mouth 2 (two) times daily.     . furosemide (LASIX) 40 MG tablet Take 40 mg by mouth daily.    . nortriptyline (PAMELOR) 50 MG capsule 50 mg. 01/28/18 one every night  1  . pantoprazole (PROTONIX) 40 MG tablet Take 40 mg by mouth daily.    . propranolol ER (INDERAL LA) 60 MG 24 hr capsule Take 1 capsule (60 mg total) by mouth at bedtime. Please call [phone] to schedule appt. 30 capsule 2   No facility-administered medications prior to visit.     PAST MEDICAL HISTORY: Past Medical History:  Diagnosis Date  . Arthritis   . Carotid artery disease ([place])    a. duplex 07/2017 - 1-39% stenosis bilaterally.  Marland Kitchen GERD (gastroesophageal reflux disease)   . Hyperlipidemia   . Hypertension   . Migraines   . OSA (obstructive sleep apnea)  CPAP  . Peripheral neuropathy   . Snores   . Wears glasses     PAST SURGICAL HISTORY: Past Surgical History:  Procedure Laterality Date  . ABDOMINAL HYSTERECTOMY  1970   Total HYST  . BREAST BIOPSY Bilateral 2001   benign  . BREAST EXCISIONAL BIOPSY Right 2013   sclerosing lesion  . COLONOSCOPY    . JOINT REPLACEMENT  2001   Left Knee Replacement  . KNEE ARTHROSCOPY     lt  . WISDOM TOOTH EXTRACTION      FAMILY HISTORY: Family History  Problem Relation Age of Onset  . Cancer Mother   . Hypertension Mother   . Hypertension Father    . Heart disease Father        MI around 52, died at 98  . Cancer Brother        Prostate Ca  . Cancer Sister        Brain Ca  . CAD Daughter        Had bypass in her 47s (type 1 diabetic), died at 36    SOCIAL HISTORY: Social History   Socioeconomic History  . Marital status: Widowed    Spouse name: Laverna Peace  . Number of children: 2  . Years of education: 72  . Highest education level: Not on file  Occupational History  . Occupation: retired    Comment: works partime with vendors  Social Needs  . Financial resource strain: Not on file  . Food insecurity:    Worry: Not on file    Inability: Not on file  . Transportation needs:    Medical: Not on file    Non-medical: Not on file  Tobacco Use  . Smoking status: Never Smoker  . Smokeless tobacco: Never Used  Substance and Sexual Activity  . Alcohol use: No  . Drug use: No  . Sexual activity: Not on file  Lifestyle  . Physical activity:    Days per week: Not on file    Minutes per session: Not on file  . Stress: Not on file  Relationships  . Social connections:    Talks on phone: Not on file    Gets together: Not on file    Attends religious service: Not on file    Active member of club or organization: Not on file    Attends meetings of clubs or organizations: Not on file    Relationship status: Not on file  . Intimate partner violence:    Fear of current or ex partner: Not on file    Emotionally abused: Not on file    Physically abused: Not on file    Forced sexual activity: Not on file  Other Topics Concern  . Not on file  Social History Narrative   Patient is widowed. Patient is retired . 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 Patient has a 11th grade education. She has two children     PHYSICAL EXAM  Vitals:   06/02/18 1032  BP: 134/69  Pulse: 72  Weight: 209 lb 3.2 oz (94.9 kg)  Height: 5\' 6"  (1.676 m)   Body mass index is 33.77 kg/m.  Generalized: Well developed, obese female  in no acute distress  Head: normocephalic and atraumatic,. Oropharynx benign mallopatti 4 Neck: Supple, circumference 15   Musculoskeletal: No deformity  Skin no peripheral edema Neurological examination   Mentation: Alert oriented to time, place, history taking. Attention span and concentration appropriate. Recent and remote memory intact.  Follows all commands speech and language fluent. ESS 4  Cranial nerve II-XII: Pupils were equal round reactive to light extraocular movements were full, visual field were full on confrontational test. Facial sensation and strength were normal. hearing was intact to finger rubbing bilaterally. Uvula tongue midline. head turning and shoulder shrug were normal and symmetric.Tongue protrusion into cheek strength was normal. Motor: normal bulk and tone, full strength in the BUE, BLE, Sensory: normal and symmetric to light touch,   Coordination: finger-nose-finger, heel-to-shin bilaterally, no dysmetria Gait and Station: Rising up from seated position without assistance, normal stance,  moderate stride, good arm swing, smooth turning, able to perform tiptoe, and heel walking without difficulty. Tandem gait is steady  DIAGNOSTIC DATA (LABS, IMAGING, TESTING) - I reviewed patient records, labs, notes, testing and imaging myself where available.  Lab Results  Component Value Date   WBC 10.2 11/22/2014   HGB 13.9 11/22/2014   HCT 41.1 11/22/2014   MCV 92 11/22/2014   PLT 393 (H) 11/22/2014      Component Value Date/Time   NA 139 11/22/2014 1050   K 4.5 11/22/2014 1050   CL 98 11/22/2014 1050   CO2 20 11/22/2014 1050   GLUCOSE 101 (H) 11/22/2014 1050   GLUCOSE 125 (H) 09/01/2014 1019   BUN 11 11/22/2014 1050   CREATININE 0.67 11/22/2014 1050   CALCIUM 9.7 11/22/2014 1050   PROT 6.1 12/14/2017 1145   ALBUMIN 4.0 12/14/2017 1145   AST 27 12/14/2017 1145   ALT 34 (H) 12/14/2017 1145   ALKPHOS 82 12/14/2017 1145   BILITOT 0.3 12/14/2017 1145    GFRNONAA 90 11/22/2014 1050   GFRAA 104 11/22/2014 1050   Lab Results  Component Value Date   CHOL 124 11/16/2017   HDL 49 11/16/2017   LDLCALC 57 11/16/2017   TRIG 89 11/16/2017   CHOLHDL 2.5 11/16/2017    Lab Results  Component Value Date   TSH 2.560 11/22/2014      ASSESSMENT AND PLAN  72 y.o. year old female  has a past medical history of Arthritis, Carotid artery disease ([place]), GERD (gastroesophageal reflux disease), Hyperlipidemia, Hypertension, Migraines, OSA (obstructive sleep apnea), Peripheral neuropathy, here to follow-up for initial CPAP compliance.. Data dated 05/03/2017-06/01/2018 shows compliance greater than 4 hours 87%.  Average usage 5 hours 37 minutes.  Set pressure 5 to 16 cm leak 95th percentile 8.3 AHI 6.4 Otherwise she is satisfied with the CPAP and feels much less fatigue.    PLAN: CPAP compliance 87% greater than 4 hours  Needs new mask and filters will order Continue same settings F/U yearly I spent 15 minutes in total face to face time with the patient more than 50% of which was spent counseling and coordination of care, reviewing test results reviewing medications and discussing and reviewing the diagnosis of obstructive sleep apnea and her compliance report.  Dennie Bible, [facility], [facility], APRN  [facility] [address] [place], [place] [address] [phone]

## 2018-06-01 ENCOUNTER — Encounter: Payer: Self-pay | Admitting: Nurse Practitioner

## 2018-06-02 ENCOUNTER — Ambulatory Visit (INDEPENDENT_AMBULATORY_CARE_PROVIDER_SITE_OTHER): Payer: Medicare Other | Admitting: Nurse Practitioner

## 2018-06-02 ENCOUNTER — Encounter: Payer: Self-pay | Admitting: Nurse Practitioner

## 2018-06-02 VITALS — BP 134/69 | HR 72 | Ht 66.0 in | Wt 209.2 lb

## 2018-06-02 DIAGNOSIS — Z9989 Dependence on other enabling machines and devices: Secondary | ICD-10-CM | POA: Diagnosis not present

## 2018-06-02 DIAGNOSIS — G4733 Obstructive sleep apnea (adult) (pediatric): Secondary | ICD-10-CM

## 2018-06-02 NOTE — Progress Notes (Signed)
CPAP order re: needs filters, velcro strips for mask faxed to [facility].

## 2018-06-02 NOTE — Patient Instructions (Signed)
CPAP compliance 87% greater than 4 hours  Needs new mask and filters Continue same settings F/U yearly

## 2018-07-14 NOTE — Progress Notes (Signed)
I agree with the assessment and plan as directed by NP .The patient is known to me .   Nupur Hohman, MD  

## 2018-07-15 ENCOUNTER — Encounter: Payer: Self-pay | Admitting: Cardiology

## 2018-07-30 ENCOUNTER — Ambulatory Visit: Payer: Medicare Other | Admitting: Cardiology

## 2018-08-09 ENCOUNTER — Ambulatory Visit
Admission: RE | Admit: 2018-08-09 | Discharge: 2018-08-09 | Disposition: A | Discharge status: Home / Self Care | Payer: Medicare Other | Source: Ambulatory Visit | Attending: Internal Medicine | Admitting: Internal Medicine

## 2018-08-09 DIAGNOSIS — Z139 Encounter for screening, unspecified: Secondary | ICD-10-CM

## 2018-08-10 ENCOUNTER — Other Ambulatory Visit: Payer: Self-pay | Admitting: Internal Medicine

## 2018-08-10 DIAGNOSIS — R928 Other abnormal and inconclusive findings on diagnostic imaging of breast: Secondary | ICD-10-CM

## 2018-08-16 ENCOUNTER — Encounter: Payer: Self-pay | Admitting: Cardiology

## 2018-08-16 ENCOUNTER — Ambulatory Visit
Admission: RE | Admit: 2018-08-16 | Discharge: 2018-08-16 | Disposition: A | Discharge status: Home / Self Care | Payer: Medicare Other | Source: Ambulatory Visit | Attending: Internal Medicine | Admitting: Internal Medicine

## 2018-08-16 ENCOUNTER — Other Ambulatory Visit: Payer: Self-pay | Admitting: Internal Medicine

## 2018-08-16 ENCOUNTER — Ambulatory Visit (INDEPENDENT_AMBULATORY_CARE_PROVIDER_SITE_OTHER): Payer: Medicare Other | Admitting: Cardiology

## 2018-08-16 VITALS — BP 118/62 | HR 70 | Ht 66.0 in | Wt 210.6 lb

## 2018-08-16 DIAGNOSIS — R928 Other abnormal and inconclusive findings on diagnostic imaging of breast: Secondary | ICD-10-CM

## 2018-08-16 DIAGNOSIS — I10 Essential (primary) hypertension: Secondary | ICD-10-CM

## 2018-08-16 DIAGNOSIS — I739 Peripheral vascular disease, unspecified: Secondary | ICD-10-CM

## 2018-08-16 DIAGNOSIS — E785 Hyperlipidemia, unspecified: Secondary | ICD-10-CM

## 2018-08-16 DIAGNOSIS — R0789 Other chest pain: Secondary | ICD-10-CM

## 2018-08-16 DIAGNOSIS — N6489 Other specified disorders of breast: Secondary | ICD-10-CM

## 2018-08-16 DIAGNOSIS — I779 Disorder of arteries and arterioles, unspecified: Secondary | ICD-10-CM

## 2018-08-16 NOTE — Progress Notes (Signed)
Cardiology Office Note:    Date:  08/16/2018   ID:  Pamela Hendricks, DOB May 26, 1946, MRN [id number]  PCP:  Leeroy Cha, MD  Cardiologist:  No primary care provider on file.  Electrophysiologist:  None   Referring MD: Leeroy Cha,*     History of Present Illness:    Pamela Hendricks is a 72 y.o. female here for follow-up of carotid artery disease GERD hypertension hyperlipidemia obstructive sleep apnea.  In review of Pamela Hendricks's note from 07/30/2017 she had undergone a mobile screening fair that showed carotid artery disease.  Also noted atherosclerotic changes of aorta on noninvasive imaging.  Her husband used to be a patient of Dr. Doreatha Lew.  During that prior encounter she was having episodic chest pain for 5 years with atypical features like a pulling component.  Exercise treadmill test on 03/31/2017 showed mildly reduced exercise tolerance hypertensive response to exercise 216/82 otherwise normal.  Nuclear stress test was then performed that was reassuring, low risk.  She ended up having a foot surgery.  She had a carotid ultrasound performed on 07/15/2017 that showed 1 to 39% stenosis bilaterally, no follow-up.  She continues with low-dose atorvastatin, low-dose aspirin, beta-blocker.   Past Medical History:  Diagnosis Date  . Arthritis   . Carotid artery disease ([place])    a. duplex 07/2017 - 1-39% stenosis bilaterally.  Marland Kitchen GERD (gastroesophageal reflux disease)   . Hyperlipidemia   . Hypertension   . Migraines   . OSA (obstructive sleep apnea)    CPAP  . Peripheral neuropathy   . Snores   . Wears glasses     Past Surgical History:  Procedure Laterality Date  . ABDOMINAL HYSTERECTOMY  1970   Total HYST  . BREAST BIOPSY Bilateral 2001   benign  . BREAST EXCISIONAL BIOPSY Right 2013   sclerosing lesion  . COLONOSCOPY    . JOINT REPLACEMENT  2001   Left Knee Replacement  . KNEE ARTHROSCOPY     lt  . WISDOM TOOTH EXTRACTION      Current  Medications: Current Meds  Medication Sig  . aspirin EC 81 MG tablet Take 81 mg by mouth daily.  Marland Kitchen atorvastatin (LIPITOR) 10 MG tablet Take 10 mg by mouth daily.  . benazepril (LOTENSIN) 40 MG tablet Take 40 mg by mouth daily.   . Calcium Carbonate-Vit D-Min (CALCIUM 1200 PO) Take 1 tablet by mouth 2 (two) times daily.   . fluconazole (DIFLUCAN) 150 MG tablet   . furosemide (LASIX) 40 MG tablet Take 40 mg by mouth daily.  . nortriptyline (PAMELOR) 50 MG capsule 50 mg. 01/28/18 one every night  . pantoprazole (PROTONIX) 40 MG tablet Take 40 mg by mouth daily.  . propranolol ER (INDERAL LA) 60 MG 24 hr capsule Take 1 capsule (60 mg total) by mouth at bedtime. Please call [phone] to schedule appt.     Allergies:   Patient has no known allergies.   Social History   Socioeconomic History  . Marital status: Widowed    Spouse name: Laverna Peace  . Number of children: 2  . Years of education: 94  . Highest education level: Not on file  Occupational History  . Occupation: retired    Comment: works partime with vendors  Social Needs  . Financial resource strain: Not on file  . Food insecurity:    Worry: Not on file    Inability: Not on file  . Transportation needs:    Medical: Not on file  Non-medical: Not on file  Tobacco Use  . Smoking status: Never Smoker  . Smokeless tobacco: Never Used  Substance and Sexual Activity  . Alcohol use: No  . Drug use: No  . Sexual activity: Not on file  Lifestyle  . Physical activity:    Days per week: Not on file    Minutes per session: Not on file  . Stress: Not on file  Relationships  . Social connections:    Talks on phone: Not on file    Gets together: Not on file    Attends religious service: Not on file    Active member of club or organization: Not on file    Attends meetings of clubs or organizations: Not on file    Relationship status: Not on file  Other Topics Concern  . Not on file  Social History Narrative   Patient is widowed.  Patient is retired . 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 Patient has a 11th grade education. She has two children     Family History: The patient's family history includes CAD in her daughter; Cancer in her brother, mother, and sister; Heart disease in her father; Hypertension in her father and mother.  ROS:   Please see the history of present illness.     All other systems reviewed and are negative.  EKGs/Labs/Other Studies Reviewed:    The following studies were reviewed today: Prior labs, EKG, office notes, stress test  EKG:  EKG is  ordered today.  The ekg ordered today demonstrates 08/16/2018-sinus rhythm 70 with no other abnormalities.  Personally reviewed and interpreted.  Recent Labs: 12/14/2017: ALT 34  Recent Lipid Panel    Component Value Date/Time   CHOL 124 11/16/2017 0906   TRIG 89 11/16/2017 0906   HDL 49 11/16/2017 0906   CHOLHDL 2.5 11/16/2017 0906   LDLCALC 57 11/16/2017 0906    Physical Exam:    VS:  BP 118/62   Pulse 70   Ht 5\' 6"  (1.676 m)   Wt 210 lb 9.6 oz (95.5 kg)   SpO2 98%   BMI 33.99 kg/m     Wt Readings from Last 3 Encounters:  08/16/18 210 lb 9.6 oz (95.5 kg)  06/02/18 209 lb 3.2 oz (94.9 kg)  01/28/18 211 lb 3.2 oz (95.8 kg)     GEN:  Well nourished, well developed in no acute distress, overweight HEENT: Normal NECK: No JVD; No carotid bruits LYMPHATICS: No lymphadenopathy CARDIAC: RRR, no murmurs, rubs, gallops RESPIRATORY:  Clear to auscultation without rales, wheezing or rhonchi  ABDOMEN: Soft, non-tender, non-distended MUSCULOSKELETAL:  No edema; No deformity  SKIN: Warm and dry NEUROLOGIC:  Alert and oriented x 3 PSYCHIATRIC:  Normal affect   ASSESSMENT:    1. Essential hypertension   2. Atypical chest pain   3. Bilateral carotid artery disease, unspecified type ([place])   4. Hyperlipidemia LDL goal <70   5. Hyperlipidemia, unspecified hyperlipidemia type    PLAN:    In order of problems listed  above:  Carotid artery plaque -Mild 1 to 39% bilaterally.  Continue with statin, aspirin, low-dose.  Excellent.  Blood pressure control is excellent.  No changes made.  No further carotid Dopplers needed.  Aortic atherosclerosis -Seen on noninvasive imaging.  Continue with statin.  Aspirin.  No signs of bleeding.  Obesity -BMI 33.  Decrease carbohydrates.  Hard for her to exercise because of orthopedic/foot issues/knee issues.  Dr. Latanya Maudlin has been taking care of her.  Essential hypertension -Under excellent control with benazepril.  Hyperlipidemia -Low-dose atorvastatin-LDL 85 in April 2019.  She can see [place] as needed.  Medication Adjustments/Labs and Tests Ordered: Current medicines are reviewed at length with the patient today.  Concerns regarding medicines are outlined above.  Orders Placed This Encounter  Procedures  . EKG 12-Lead   No orders of the defined types were placed in this encounter.   Patient Instructions  Medication Instructions:  The current medical regimen is effective;  continue present plan and medications.  Follow-Up: Follow up with Dr Marlou Porch as needed.   Thank you for choosing [facility]!!        Signed, Candee Furbish, MD  08/16/2018 11:28 AM    [place]

## 2018-08-16 NOTE — Patient Instructions (Signed)
Medication Instructions:  The current medical regimen is effective;  continue present plan and medications.  Follow-Up: . Follow up with Dr Skains as needed.  Thank you for choosing [facility]!!      

## 2018-08-19 ENCOUNTER — Ambulatory Visit
Admission: RE | Admit: 2018-08-19 | Discharge: 2018-08-19 | Disposition: A | Discharge status: Home / Self Care | Payer: Medicare Other | Source: Ambulatory Visit | Attending: Internal Medicine | Admitting: Internal Medicine

## 2018-08-19 DIAGNOSIS — N6489 Other specified disorders of breast: Secondary | ICD-10-CM

## 2018-08-26 ENCOUNTER — Other Ambulatory Visit: Payer: Self-pay | Admitting: General Surgery

## 2018-08-26 DIAGNOSIS — R928 Other abnormal and inconclusive findings on diagnostic imaging of breast: Secondary | ICD-10-CM

## 2018-08-27 ENCOUNTER — Other Ambulatory Visit (HOSPITAL_COMMUNITY): Payer: Medicare Other

## 2018-08-27 NOTE — Pre-Procedure Instructions (Signed)
ALITA WALDREN  08/27/2018      [facility] [address] [place], [place] - [address] [facility] [address] [facility] [address] [place] [address] Phone: [phone] Fax: [phone]  [id number] [address], [place] [address] [place] [address] Phone: [phone] Fax: [phone]    Your procedure is scheduled on October 22nd.  Report to [facility] Admitting at 0930 A.M.  Call this number if you have problems the morning of surgery:  [phone]   Remember:  Do not eat after midnight.  You may drink clear liquids until 0830 am .  Clear liquids allowed are:                    Water, Juice (non-citric and without pulp), Carbonated beverages, Clear Tea and Black Coffee only    Take these medicines the morning of surgery with A SIP OF WATER   Zyrtec   Flonase  7 days prior to surgery STOP taking any Aspirin(unless otherwise instructed by your surgeon), Aleve, Naproxen, Ibuprofen, Motrin, Advil, Goody's, BC's, all herbal medications, fish oil, and all vitamins     Do not wear jewelry, make-up or nail polish.  Do not wear lotions, powders, or perfumes, or deodorant.  Do not shave 48 hours prior to surgery.  Men may shave face and neck.  Do not bring valuables to the hospital.  [facility] is not responsible for any belongings or valuables.  Contacts, dentures or bridgework may not be worn into surgery.  Leave your suitcase in the car.  After surgery it may be brought to your room.  For patients admitted to the hospital, discharge time will be determined by your treatment team.  Patients discharged the day of surgery will not be allowed to drive home.    [place]- Preparing For Surgery  Before surgery, you can play an important role. Because skin is not sterile, your skin needs to be as free of germs as possible. You can reduce the number of germs on your skin by washing with CHG  (chlorahexidine gluconate) Soap before surgery.  CHG is an antiseptic cleaner which kills germs and bonds with the skin to continue killing germs even after washing.    Oral Hygiene is also important to reduce your risk of infection.  Remember - BRUSH YOUR TEETH THE MORNING OF SURGERY WITH YOUR REGULAR TOOTHPASTE  Please do not use if you have an allergy to CHG or antibacterial soaps. If your skin becomes reddened/irritated stop using the CHG.  Do not shave (including legs and underarms) for at least 48 hours prior to first CHG shower. It is OK to shave your face.  Please follow these instructions carefully.   1. Shower the NIGHT BEFORE SURGERY and the MORNING OF SURGERY with CHG.   2. If you chose to wash your hair, wash your hair first as usual with your normal shampoo.  3. After you shampoo, rinse your hair and body thoroughly to remove the shampoo.  4. Use CHG as you would any other liquid soap. You can apply CHG directly to the skin and wash gently with a scrungie or a clean washcloth.   5. Apply the CHG Soap to your body ONLY FROM THE NECK DOWN.  Do not use on open wounds or open sores. Avoid contact with your eyes, ears, mouth and genitals (private parts). Wash  Face and genitals (private parts)  with your normal soap.  6. Wash thoroughly, paying special attention to the area where your surgery will be performed.  7. Thoroughly rinse your body with warm water from the neck down.  8. DO NOT shower/wash with your normal soap after using and rinsing off the CHG Soap.  9. Pat yourself dry with a CLEAN TOWEL.  10. Wear CLEAN PAJAMAS to bed the night before surgery, wear comfortable clothes the morning of surgery  11. Place CLEAN SHEETS on your bed the night of your first shower and DO NOT SLEEP WITH PETS.    Day of Surgery:  Do not apply any deodorants/lotions.  Please wear clean clothes to the hospital/surgery center.   Remember to brush your teeth WITH YOUR REGULAR  TOOTHPASTE.    Please read over the following fact sheets that you were given.

## 2018-08-29 NOTE — H&P (Signed)
Pamela Hendricks Location: [facility] Patient #: [id number] DOB: Sep 07, 1946 Widowed / Language: Pamela Hendricks / Race: White Female        History of Present Illness       This is a very pleasant 72 year old female, referred to me by Dr. Enrique Sack at the [facility] for complex sclerosing lesion left breast. Dr.Varadarajan is her PCP.      In 2013 I performed a right lumpectomy for complex sclerosing lesion. She had no problems. otherwise no breast problems. Recent screening mammogram shows an area of architectural distortion in the left breast upper outer quadrant, posterior depth. Ultrasound of the left breast shows no mass. Ultrasound of the axilla is negative. Image guided biopsy of the left shows complex or oozing lesion. I explained that this is similar to 2013. I explained the risk of cancer being somewhere between 5 and 10%. She does not want to take a chance. She wants to go ahead with radioactive seed guided excision.      Past history significant for obstructive sleep apnea. Followed by neurology. Neuropathy. Hypertension. GERD. Abdominal hysterectomy with one ovary removed. Right breast biopsy 2013 for CS L. Left total knee replacement. She needs the right knee replaced as well. Followed by Dr. Novella Olive. Family history reveals mother died of pneumonia and had hypertension. Father died of myocardial infarction. Sister died of brain cancer. Brother living with prostate cancer. She had a daughter with type 1 diabetes and died at age 56 of myocardial infarction. Social history reveals she is a widow and lives alone. Denies alcohol or tobacco. Has 2 children. Her remaining child lives in [place]     She will be scheduled for left breast lumpectomy with radioactive seed localization. I have discussed indications, details, techniques, and numerous risk of the surgery with her. She is aware of the risk of bleeding, infection, reoperation of cancer,  cosmetic deformity, chronic pain. She understands all of these issues. All questions were answered. She agrees with this plan.   Past Surgical History  Colon Polyp Removal - Colonoscopy   Diagnostic Studies History  Mammogram  within last year  Allergies  No Known Drug Allergies   Medication History  Furosemide (40MG  Tablet, Oral) Active. Propranolol HCl ER (60MG  Capsule ER 24HR, Oral) Active. Benazepril HCl (40MG  Tablet, Oral) Active. Atorvastatin Calcium (10MG  Tablet, Oral) Active. Aspirin (81MG  Tablet Chewable, Oral) Active. Nortriptyline HCl (50MG  Capsule, Oral) Active. Pantoprazole Sodium (40MG  Tablet DR, Oral) Active. Calcium 600 (1500 (600 Ca)MG Tablet, Oral) Active. Fluticasone Propionate (50MCG/ACT Suspension, Nasal) Active. Cetirizine HCl (10MG  Tablet, Oral) Active. Medications Reconciled  Social History Caffeine use  Coffee, Tea. No alcohol use  No drug use   Family History  Cancer  Sister. Cerebrovascular Accident  Daughter. Diabetes Mellitus  Daughter. Heart Disease  Daughter, Father. Hypertension  Brother, Daughter, Mother. Migraine Headache  Daughter. Prostate Cancer  Brother.  Pregnancy / Birth History  Age at menarche  71 years.  Other Problems  Arthritis  Bladder Problems  Gastroesophageal Reflux Disease  Hypercholesterolemia  Sleep Apnea     Review of Systems  General Present- Fatigue. Not Present- Appetite Loss, Chills, Fever, Night Sweats, Weight Gain and Weight Loss. Skin Present- Rash. Not Present- Change in Wart/Mole, Dryness, Hives, Jaundice, New Lesions, Non-Healing Wounds and Ulcer. Respiratory Present- Snoring. Not Present- Bloody sputum, Chronic Cough, Difficulty Breathing and Wheezing. Breast Not Present- Breast Mass, Breast Pain, Nipple Discharge and Skin Changes. Cardiovascular Present- Swelling of Extremities. Not Present- Chest Pain, Difficulty  Breathing Lying Down, Leg Cramps, Palpitations,  Rapid Heart Rate and Shortness of Breath. Gastrointestinal Present- Hemorrhoids. Not Present- Abdominal Pain, Bloating, Bloody Stool, Change in Bowel Habits, Chronic diarrhea, Constipation, Difficulty Swallowing, Excessive gas, Gets full quickly at meals, Indigestion, Nausea, Rectal Pain and Vomiting. Female Genitourinary Not Present- Frequency, Nocturia, Painful Urination, Pelvic Pain and Urgency. Psychiatric Not Present- Anxiety, Bipolar, Change in Sleep Pattern, Depression, Fearful and Frequent crying. Hematology Present- Easy Bruising. Not Present- Blood Thinners, Excessive bleeding, Gland problems, HIV and Persistent Infections.  Vitals  Weight: 205 lb Height: 65in Body Surface Area: 2 m Body Mass Index: 34.11 kg/m  Temp.: 97.43F(Oral)  Pulse: 74 (Regular)  BP: 126/70 (Sitting, Left Arm, Standard)    Physical Exam General Mental Status-Alert. General Appearance-Consistent with stated age. Hydration-Well hydrated. Voice-Normal.  Head and Neck Head-normocephalic, atraumatic with no lesions or palpable masses. Trachea-midline. Thyroid Gland Characteristics - normal size and consistency.  Eye Eyeball - Bilateral-Extraocular movements intact. Sclera/Conjunctiva - Bilateral-No scleral icterus.  Chest and Lung Exam Chest and lung exam reveals -quiet, even and easy respiratory effort with no use of accessory muscles and on auscultation, normal breath sounds, no adventitious sounds and normal vocal resonance. Inspection Chest Wall - Normal. Back - normal.  Breast Breast - Left-Symmetric, Non Tender, No Biopsy scars, no Dimpling, No Inflammation, No Lumpectomy scars, No Mastectomy scars, No Peau d' Orange. Breast - Right-Symmetric, Non Tender, No Biopsy scars, no Dimpling, No Inflammation, No Lumpectomy scars, No Mastectomy scars, No Peau d' Orange. Breast Lump-No Palpable Breast Mass. Note: Breasts are moderately enlarged. Soft. No palpable  mass on either side. On the right there is a well-healed lumpectomy scar laterally. Left eye do not see any skin changes other than the needle biopsy site. No hematoma. No axillary adenopathy on either side. No cervical or supraclavicular adenopathy.   Cardiovascular Cardiovascular examination reveals -normal heart sounds, regular rate and rhythm with no murmurs and normal pedal pulses bilaterally.  Abdomen Inspection Inspection of the abdomen reveals - No Hernias. Skin - Scar - Note: Healed Pfannenstiel incision. Palpation/Percussion Palpation and Percussion of the abdomen reveal - Soft, Non Tender, No Rebound tenderness, No Rigidity (guarding) and No hepatosplenomegaly. Auscultation Auscultation of the abdomen reveals - Bowel sounds normal.  Neurologic Neurologic evaluation reveals -alert and oriented x 3 with no impairment of recent or remote memory. Mental Status-Normal.  Neuropsychiatric Note: Very pleasant. Oriented 4. She does not have good memory of the details of her prior right breast biopsy but does remember that it was done.   Musculoskeletal Normal Exam - Left-Upper Extremity Strength Normal and Lower Extremity Strength Normal. Normal Exam - Right-Upper Extremity Strength Normal and Lower Extremity Strength Normal.  Lymphatic Head & Neck  General Head & Neck Lymphatics: Bilateral - Description - Normal. Axillary  General Axillary Region: Bilateral - Description - Normal. Tenderness - Non Tender. Femoral & Inguinal  Generalized Femoral & Inguinal Lymphatics: Bilateral - Description - Normal. Tenderness - Non Tender.    Assessment & Plan  ABNORMALITY OF LEFT BREAST ON SCREENING MAMMOGRAM (R92.8)   Your recent imaging studies and biopsy show a small area of distortion in the upper outer quadrant of the left breast The needle biopsy shows complex sclerosing lesion No cancer cells were seen, but there is a statistical risk of about 5-10% that there  could be cancer there at this time The standard of care is to excise this with a lumpectomy as we discussed You have decided to go ahead with the lumpectomy  surgery  you will be scheduled for left breast lumpectomy with radioactive seed localization I have discussed the indications, techniques, and risk of the surgery in detail  SLEEP APNEA IN ADULT (G47.30) HYPERTENSION, ESSENTIAL (I10) H/O ABDOMINAL HYSTERECTOMY (Z90.710) Impression: 1 ovary removed H/O TOTAL KNEE REPLACEMENT, LEFT (U13.244)    Edsel Petrin. Dalbert Batman, M.D., [facility] General and Minimally invasive Surgery Breast and Colorectal Surgery Office:   [phone] Pager:   [phone]

## 2018-08-30 ENCOUNTER — Encounter (HOSPITAL_COMMUNITY): Payer: Self-pay

## 2018-08-30 ENCOUNTER — Ambulatory Visit
Admission: RE | Admit: 2018-08-30 | Discharge: 2018-08-30 | Disposition: A | Discharge status: Home / Self Care | Payer: Medicare Other | Source: Ambulatory Visit | Attending: General Surgery | Admitting: General Surgery

## 2018-08-30 ENCOUNTER — Encounter (HOSPITAL_COMMUNITY)
Admission: RE | Admit: 2018-08-30 | Discharge: 2018-08-30 | Disposition: A | Discharge status: Home / Self Care | Payer: Medicare Other | Source: Ambulatory Visit | Attending: General Surgery | Admitting: General Surgery

## 2018-08-30 ENCOUNTER — Other Ambulatory Visit: Payer: Self-pay

## 2018-08-30 DIAGNOSIS — Z01812 Encounter for preprocedural laboratory examination: Secondary | ICD-10-CM | POA: Insufficient documentation

## 2018-08-30 DIAGNOSIS — G4733 Obstructive sleep apnea (adult) (pediatric): Secondary | ICD-10-CM | POA: Diagnosis not present

## 2018-08-30 DIAGNOSIS — I10 Essential (primary) hypertension: Secondary | ICD-10-CM | POA: Diagnosis not present

## 2018-08-30 DIAGNOSIS — R928 Other abnormal and inconclusive findings on diagnostic imaging of breast: Secondary | ICD-10-CM

## 2018-08-30 DIAGNOSIS — G629 Polyneuropathy, unspecified: Secondary | ICD-10-CM | POA: Diagnosis not present

## 2018-08-30 DIAGNOSIS — N6489 Other specified disorders of breast: Secondary | ICD-10-CM | POA: Diagnosis present

## 2018-08-30 DIAGNOSIS — Z79899 Other long term (current) drug therapy: Secondary | ICD-10-CM | POA: Diagnosis not present

## 2018-08-30 DIAGNOSIS — Z96652 Presence of left artificial knee joint: Secondary | ICD-10-CM | POA: Diagnosis not present

## 2018-08-30 DIAGNOSIS — Z7982 Long term (current) use of aspirin: Secondary | ICD-10-CM | POA: Diagnosis not present

## 2018-08-30 DIAGNOSIS — Z7951 Long term (current) use of inhaled steroids: Secondary | ICD-10-CM | POA: Diagnosis not present

## 2018-08-30 DIAGNOSIS — K219 Gastro-esophageal reflux disease without esophagitis: Secondary | ICD-10-CM | POA: Diagnosis not present

## 2018-08-30 DIAGNOSIS — N6022 Fibroadenosis of left breast: Secondary | ICD-10-CM | POA: Diagnosis not present

## 2018-08-30 HISTORY — DX: Other allergic rhinitis: J30.89

## 2018-08-30 LAB — COMPREHENSIVE METABOLIC PANEL
ALT: 57 U/L — ABNORMAL HIGH (ref 0–44)
ANION GAP: 10 (ref 5–15)
AST: 38 U/L (ref 15–41)
Albumin: 3.5 g/dL (ref 3.5–5.0)
Alkaline Phosphatase: 69 U/L (ref 38–126)
BILIRUBIN TOTAL: 0.5 mg/dL (ref 0.3–1.2)
BUN: 9 mg/dL (ref 8–23)
CO2: 27 mmol/L (ref 22–32)
Calcium: 9.2 mg/dL (ref 8.9–10.3)
Chloride: 101 mmol/L (ref 98–111)
Creatinine, Ser: 0.7 mg/dL (ref 0.44–1.00)
GFR calc non Af Amer: >60 mL/min (ref >60)
Glucose, Bld: 93 mg/dL (ref 70–99)
POTASSIUM: 3.9 mmol/L (ref 3.5–5.1)
Sodium: 138 mmol/L (ref 135–145)
TOTAL PROTEIN: 6.6 g/dL (ref 6.5–8.1)

## 2018-08-30 LAB — CBC WITH DIFFERENTIAL/PLATELET
Abs Immature Granulocytes: 0.04 10*3/uL (ref 0.00–0.07)
BASOS PCT: 1 %
Basophils Absolute: 0.1 10*3/uL (ref 0.0–0.1)
EOS ABS: 0.2 10*3/uL (ref 0.0–0.5)
EOS PCT: 2 %
HCT: 41.2 % (ref 36.0–46.0)
HEMOGLOBIN: 12.8 g/dL (ref 12.0–15.0)
Immature Granulocytes: 0 %
Lymphocytes Relative: 19 %
Lymphs Abs: 1.9 10*3/uL (ref 0.7–4.0)
MCH: 29 pg (ref 26.0–34.0)
MCHC: 31.1 g/dL (ref 30.0–36.0)
MCV: 93.4 fL (ref 80.0–100.0)
MONO ABS: 0.7 10*3/uL (ref 0.1–1.0)
Monocytes Relative: 7 %
NRBC: 0 % (ref 0.0–0.2)
Neutro Abs: 6.9 10*3/uL (ref 1.7–7.7)
Neutrophils Relative %: 71 %
Platelets: 326 10*3/uL (ref 150–400)
RBC: 4.41 MIL/uL (ref 3.87–5.11)
RDW: 14.2 % (ref 11.5–15.5)
WBC: 9.7 10*3/uL (ref 4.0–10.5)

## 2018-08-30 NOTE — Pre-Procedure Instructions (Signed)
Pamela Hendricks  08/30/2018       Your procedure is scheduled on October 22nd.  Report to [facility] Admitting at 9:30 A.M.           Your surgery or procedure is scheduled for 11:30 AM  Call this number if you have problems the morning of surgery: [phone]  This is the number for the Pre- Surgical Desk.     Remember:  Do not Eat after midnight.  You may drink clear liquids until 0830 am .  Clear liquids allowed are:                    Water, Juice (non-citric and without pulp), Carbonated beverages, Clear Tea and Black Coffee only    Take these medicines the morning of surgery with A SIP OF WATER   Zyrtec   Flonase  7 days prior to surgery STOP taking any Aspirin(unless otherwise instructed by your surgeon), Aleve, Naproxen, Ibuprofen, Motrin, Advil, Goody's, BC's, all herbal medications, fish oil, and all vitamins     Do not wear jewelry, make-up or nail polish.  Do not wear lotions, powders, or perfumes, or deodorant.    [place]- Preparing For Surgery  Before surgery, you can play an important role. Because skin is not sterile, your skin needs to be as free of germs as possible. You can reduce the number of germs on your skin by washing with CHG (chlorahexidine gluconate) Soap before surgery.  CHG is an antiseptic cleaner which kills germs and bonds with the skin to continue killing germs even after washing.    Oral Hygiene is also important to reduce your risk of infection.  Remember - BRUSH YOUR TEETH THE MORNING OF SURGERY WITH YOUR REGULAR TOOTHPASTE  Please do not use if you have an allergy to CHG or antibacterial soaps. If your skin becomes reddened/irritated stop using the CHG.  Do not shave (including legs and underarms) for at least 48 hours prior to first CHG shower. It is OK to shave your face.  Please follow these instructions carefully.   1. Shower the NIGHT BEFORE SURGERY and the MORNING OF SURGERY with CHG.   2. If you chose to wash  your hair, wash your hair first as usual with your normal shampoo.  3. After you shampoo, wash your face and private area with the soap you use at home, then rinse. rinse your hair and body thoroughly to remove the shampoo.  4. Use CHG as you would any other liquid soap. You can apply CHG directly to the skin and wash gently with a scrungie or a clean washcloth.   5. Apply the CHG Soap to your body ONLY FROM THE NECK DOWN.  Do not use on open wounds or open sores. Avoid contact with your eyes, ears, mouth and genitals (private parts).   6. Wash thoroughly, paying special attention to the area where your surgery will be performed.  7. Thoroughly rinse your body with warm water from the neck down.  8. DO NOT shower/wash with your normal soap after using and rinsing off the CHG Soap.  9. Pat yourself dry with a CLEAN TOWEL.  10. Wear CLEAN PAJAMAS to bed the night before surgery, wear comfortable clothes the morning of surgery  11. Place CLEAN SHEETS on your bed the night of your first shower and DO NOT SLEEP WITH PETS.  Day of Surgery:  Shower as above Do not apply any deodorants/lotions.  Please wear clean clothes to the hospital/surgery center.   Remember to brush your teeth WITH YOUR REGULAR TOOTHPASTE.  Do not wear jewelry, make-up or nail polish.  Do not wear lotions, powders, or perfumes, or deodorant.  Do not shave 48 hours prior to surgery.  Men may shave face and neck.  Do not bring valuables to the hospital.  [facility] is not responsible for any belongings or valuables.  Contacts, dentures or bridgework may not be worn into surgery.  Leave your suitcase in the car.  After surgery it may be brought to your room.  For patients admitted to the hospital, discharge time will be determined by your treatment team.  Patients discharged the day of surgery will not be allowed to drive home.  Please read over the following fact sheets that you were given.

## 2018-08-30 NOTE — Anesthesia Preprocedure Evaluation (Addendum)
Anesthesia Evaluation  Patient identified by MRN, date of birth, ID band Patient awake    Reviewed: Allergy & Precautions, NPO status , Patient's Chart, lab work & pertinent test results  Airway Mallampati: II  TM Distance: >3 FB Neck ROM: Full    Dental no notable dental hx. (+) Teeth Intact   Pulmonary sleep apnea ,    Pulmonary exam normal breath sounds clear to auscultation       Cardiovascular Exercise Tolerance: Good hypertension, Pt. on medications Normal cardiovascular exam Rhythm:Regular Rate:Normal  ECHO 07/25/17  Nuclear stress EF: 75%.  There was no ST segment deviation noted during stress.  Defect 1: There is a small defect of moderate severity present in the apical anterior and apex location.  This is a low risk study.  The left ventricular ejection fraction is hyperdynamic (>65%).   Neuro/Psych  Headaches,  Neuromuscular disease negative psych ROS   GI/Hepatic Neg liver ROS, GERD  ,  Endo/Other  negative endocrine ROS  Renal/GU negative Renal ROS     Musculoskeletal  (+) Arthritis ,   Abdominal (+) + obese,   Peds  Hematology negative hematology ROS (+)   Anesthesia Other Findings   Reproductive/Obstetrics                           Anesthesia Physical Anesthesia Plan  ASA: III  Anesthesia Plan: General   Post-op Pain Management:    Induction: Intravenous  PONV Risk Score and Plan: 3 and Treatment may vary due to age or medical condition, Ondansetron and Dexamethasone  Airway Management Planned: LMA  Additional Equipment:   Intra-op Plan:   Post-operative Plan:   Informed Consent: I have reviewed the patients History and Physical, chart, labs and discussed the procedure including the risks, benefits and alternatives for the proposed anesthesia with the patient or authorized representative who has indicated his/her understanding and acceptance.   Dental  advisory given  Plan Discussed with: CRNA  Anesthesia Plan Comments:         Anesthesia Quick Evaluation

## 2018-08-30 NOTE — Progress Notes (Signed)
Pamela Hendricks denies chest pain or shortness of breath. Patient was seen by Dr Marlou Porch 07/25/18.  No new findings. PCP is with [facility].

## 2018-08-31 ENCOUNTER — Encounter (HOSPITAL_COMMUNITY): Payer: Self-pay | Admitting: Certified Registered"

## 2018-08-31 ENCOUNTER — Ambulatory Visit (HOSPITAL_COMMUNITY): Payer: Medicare Other | Admitting: Anesthesiology

## 2018-08-31 ENCOUNTER — Ambulatory Visit (HOSPITAL_COMMUNITY)
Admission: RE | Admit: 2018-08-31 | Discharge: 2018-08-31 | Disposition: A | Discharge status: Home / Self Care | Payer: Medicare Other | Source: Ambulatory Visit | Attending: General Surgery | Admitting: General Surgery

## 2018-08-31 ENCOUNTER — Encounter (HOSPITAL_COMMUNITY)
Admission: RE | Disposition: A | Discharge status: Home / Self Care | Payer: Self-pay | Source: Ambulatory Visit | Attending: General Surgery

## 2018-08-31 ENCOUNTER — Ambulatory Visit
Admission: RE | Admit: 2018-08-31 | Discharge: 2018-08-31 | Disposition: A | Discharge status: Home / Self Care | Payer: Medicare Other | Source: Ambulatory Visit | Attending: General Surgery | Admitting: General Surgery

## 2018-08-31 DIAGNOSIS — Z79899 Other long term (current) drug therapy: Secondary | ICD-10-CM | POA: Insufficient documentation

## 2018-08-31 DIAGNOSIS — K219 Gastro-esophageal reflux disease without esophagitis: Secondary | ICD-10-CM | POA: Insufficient documentation

## 2018-08-31 DIAGNOSIS — R928 Other abnormal and inconclusive findings on diagnostic imaging of breast: Secondary | ICD-10-CM | POA: Diagnosis present

## 2018-08-31 DIAGNOSIS — N6489 Other specified disorders of breast: Secondary | ICD-10-CM | POA: Diagnosis not present

## 2018-08-31 DIAGNOSIS — G629 Polyneuropathy, unspecified: Secondary | ICD-10-CM | POA: Insufficient documentation

## 2018-08-31 DIAGNOSIS — Z7951 Long term (current) use of inhaled steroids: Secondary | ICD-10-CM | POA: Insufficient documentation

## 2018-08-31 DIAGNOSIS — Z7982 Long term (current) use of aspirin: Secondary | ICD-10-CM | POA: Insufficient documentation

## 2018-08-31 DIAGNOSIS — G4733 Obstructive sleep apnea (adult) (pediatric): Secondary | ICD-10-CM | POA: Insufficient documentation

## 2018-08-31 DIAGNOSIS — I10 Essential (primary) hypertension: Secondary | ICD-10-CM | POA: Insufficient documentation

## 2018-08-31 DIAGNOSIS — Z96652 Presence of left artificial knee joint: Secondary | ICD-10-CM | POA: Insufficient documentation

## 2018-08-31 DIAGNOSIS — N6022 Fibroadenosis of left breast: Secondary | ICD-10-CM | POA: Insufficient documentation

## 2018-08-31 HISTORY — PX: BREAST LUMPECTOMY WITH RADIOACTIVE SEED LOCALIZATION: SHX6424

## 2018-08-31 SURGERY — BREAST LUMPECTOMY WITH RADIOACTIVE SEED LOCALIZATION
Anesthesia: General | Site: Breast | Laterality: Left

## 2018-08-31 MED ORDER — FENTANYL CITRATE (PF) 100 MCG/2ML IJ SOLN
25.0000 ug | INTRAMUSCULAR | Status: DC | PRN
  Administered 2018-08-31 (×2): 50 ug via INTRAVENOUS

## 2018-08-31 MED ORDER — CHLORHEXIDINE GLUCONATE CLOTH 2 % EX PADS: 6.0000 | MEDICATED_PAD | Freq: Once | CUTANEOUS | Status: DC

## 2018-08-31 MED ORDER — SODIUM CHLORIDE 0.9 % IV SOLN: 250.0000 mL | INTRAVENOUS | Status: DC | PRN

## 2018-08-31 MED ORDER — GABAPENTIN 300 MG PO CAPS
ORAL_CAPSULE | ORAL | Status: AC
  Filled 2018-08-31: qty 1

## 2018-08-31 MED ORDER — BUPIVACAINE-EPINEPHRINE 0.25% -1:200000 IJ SOLN
INTRAMUSCULAR | Status: DC | PRN
  Administered 2018-08-31: 18 mL

## 2018-08-31 MED ORDER — FENTANYL CITRATE (PF) 100 MCG/2ML IJ SOLN
INTRAMUSCULAR | Status: AC
  Administered 2018-08-31: 50 ug via INTRAVENOUS
  Filled 2018-08-31: qty 2

## 2018-08-31 MED ORDER — PROPOFOL 10 MG/ML IV BOLUS
INTRAVENOUS | Status: DC | PRN
  Administered 2018-08-31: 170 mg via INTRAVENOUS

## 2018-08-31 MED ORDER — 0.9 % SODIUM CHLORIDE (POUR BTL) OPTIME
TOPICAL | Status: DC | PRN
  Administered 2018-08-31: 1000 mL

## 2018-08-31 MED ORDER — PHENYLEPHRINE HCL 10 MG/ML IJ SOLN
INTRAMUSCULAR | Status: DC | PRN
  Administered 2018-08-31 (×2): 80 ug via INTRAVENOUS

## 2018-08-31 MED ORDER — LACTATED RINGERS IV SOLN: INTRAVENOUS | Status: DC

## 2018-08-31 MED ORDER — SODIUM CHLORIDE 0.9% FLUSH: 3.0000 mL | INTRAVENOUS | Status: DC | PRN

## 2018-08-31 MED ORDER — ACETAMINOPHEN 500 MG PO TABS
1000.0000 mg | ORAL_TABLET | ORAL | Status: AC
  Administered 2018-08-31: 1000 mg via ORAL

## 2018-08-31 MED ORDER — FENTANYL CITRATE (PF) 250 MCG/5ML IJ SOLN
INTRAMUSCULAR | Status: DC | PRN
  Administered 2018-08-31: 25 ug via INTRAVENOUS
  Administered 2018-08-31: 50 ug via INTRAVENOUS

## 2018-08-31 MED ORDER — ACETAMINOPHEN 650 MG RE SUPP: 650.0000 mg | RECTAL | Status: DC | PRN

## 2018-08-31 MED ORDER — PROPOFOL 10 MG/ML IV BOLUS
INTRAVENOUS | Status: AC
  Filled 2018-08-31: qty 20

## 2018-08-31 MED ORDER — ACETAMINOPHEN 500 MG PO TABS
ORAL_TABLET | ORAL | Status: AC
  Filled 2018-08-31: qty 2

## 2018-08-31 MED ORDER — BUPIVACAINE-EPINEPHRINE (PF) 0.25% -1:200000 IJ SOLN
INTRAMUSCULAR | Status: AC
  Filled 2018-08-31: qty 30

## 2018-08-31 MED ORDER — LACTATED RINGERS IV SOLN
INTRAVENOUS | Status: DC
  Administered 2018-08-31: 10:00:00 via INTRAVENOUS

## 2018-08-31 MED ORDER — FENTANYL CITRATE (PF) 100 MCG/2ML IJ SOLN: 25.0000 ug | INTRAMUSCULAR | Status: DC | PRN

## 2018-08-31 MED ORDER — FENTANYL CITRATE (PF) 250 MCG/5ML IJ SOLN
INTRAMUSCULAR | Status: AC
  Filled 2018-08-31: qty 5

## 2018-08-31 MED ORDER — ACETAMINOPHEN 325 MG PO TABS: 650.0000 mg | ORAL_TABLET | ORAL | Status: DC | PRN

## 2018-08-31 MED ORDER — ONDANSETRON HCL 4 MG/2ML IJ SOLN
INTRAMUSCULAR | Status: DC | PRN
  Administered 2018-08-31: 4 mg via INTRAVENOUS

## 2018-08-31 MED ORDER — KETOROLAC TROMETHAMINE 15 MG/ML IJ SOLN
15.0000 mg | Freq: Once | INTRAMUSCULAR | Status: AC | PRN
  Administered 2018-08-31: 15 mg via INTRAVENOUS

## 2018-08-31 MED ORDER — DEXAMETHASONE SODIUM PHOSPHATE 10 MG/ML IJ SOLN
INTRAMUSCULAR | Status: DC | PRN
  Administered 2018-08-31: 5 mg via INTRAVENOUS

## 2018-08-31 MED ORDER — OXYCODONE HCL 5 MG PO TABS: 5.0000 mg | ORAL_TABLET | ORAL | Status: DC | PRN

## 2018-08-31 MED ORDER — GABAPENTIN 300 MG PO CAPS
300.0000 mg | ORAL_CAPSULE | ORAL | Status: AC
  Administered 2018-08-31: 300 mg via ORAL

## 2018-08-31 MED ORDER — ONDANSETRON HCL 4 MG/2ML IJ SOLN: 4.0000 mg | Freq: Once | INTRAMUSCULAR | Status: DC | PRN

## 2018-08-31 MED ORDER — LIDOCAINE 2% (20 MG/ML) 5 ML SYRINGE
INTRAMUSCULAR | Status: DC | PRN
  Administered 2018-08-31: 100 mg via INTRAVENOUS

## 2018-08-31 MED ORDER — HYDROCODONE-ACETAMINOPHEN 5-325 MG PO TABS: 1.0000 | ORAL_TABLET | Freq: Four times a day (QID) | ORAL | 0 refills | Status: DC | PRN

## 2018-08-31 MED ORDER — CEFAZOLIN SODIUM-DEXTROSE 2-4 GM/100ML-% IV SOLN
2.0000 g | INTRAVENOUS | Status: AC
  Administered 2018-08-31: 2 g via INTRAVENOUS

## 2018-08-31 MED ORDER — CEFAZOLIN SODIUM-DEXTROSE 2-4 GM/100ML-% IV SOLN
INTRAVENOUS | Status: AC
  Filled 2018-08-31: qty 100

## 2018-08-31 MED ORDER — SODIUM CHLORIDE 0.9% FLUSH: 3.0000 mL | Freq: Two times a day (BID) | INTRAVENOUS | Status: DC

## 2018-08-31 MED ORDER — KETOROLAC TROMETHAMINE 15 MG/ML IJ SOLN
INTRAMUSCULAR | Status: AC
  Administered 2018-08-31: 15 mg via INTRAVENOUS
  Filled 2018-08-31: qty 1

## 2018-08-31 SURGICAL SUPPLY — 49 items
ADH SKN CLS APL DERMABOND .7 (GAUZE/BANDAGES/DRESSINGS) ×1
APPLIER CLIP 9.375 MED OPEN (MISCELLANEOUS) ×3
APR CLP MED 9.3 20 MLT OPN (MISCELLANEOUS) ×1
BINDER BREAST XLRG (GAUZE/BANDAGES/DRESSINGS) ×2 IMPLANT
BLADE SURG 15 STRL LF DISP TIS (BLADE) ×1 IMPLANT
BLADE SURG 15 STRL SS (BLADE) ×6
CANISTER SUCT 3000ML PPV (MISCELLANEOUS) ×3 IMPLANT
CHLORAPREP W/TINT 26ML (MISCELLANEOUS) ×3 IMPLANT
CLIP APPLIE 9.375 MED OPEN (MISCELLANEOUS) ×1 IMPLANT
COVER PROBE W GEL 5X96 (DRAPES) ×3 IMPLANT
COVER SURGICAL LIGHT HANDLE (MISCELLANEOUS) ×3 IMPLANT
DERMABOND ADVANCED (GAUZE/BANDAGES/DRESSINGS) ×2
DERMABOND ADVANCED .7 DNX12 (GAUZE/BANDAGES/DRESSINGS) ×1 IMPLANT
DEVICE DUBIN SPECIMEN MAMMOGRA (MISCELLANEOUS) ×3 IMPLANT
DRAPE CHEST BREAST 15X10 FENES (DRAPES) ×3 IMPLANT
DRAPE UTILITY XL STRL (DRAPES) ×3 IMPLANT
DRSG PAD ABDOMINAL 8X10 ST (GAUZE/BANDAGES/DRESSINGS) ×3 IMPLANT
ELECT CAUTERY BLADE 6.4 (BLADE) ×3 IMPLANT
ELECT REM PT RETURN 9FT ADLT (ELECTROSURGICAL) ×3
ELECTRODE REM PT RTRN 9FT ADLT (ELECTROSURGICAL) ×1 IMPLANT
GAUZE SPONGE 4X4 12PLY STRL LF (GAUZE/BANDAGES/DRESSINGS) ×3 IMPLANT
GLOVE BIOGEL PI IND STRL 6.5 (GLOVE) IMPLANT
GLOVE BIOGEL PI IND STRL 8 (GLOVE) IMPLANT
GLOVE BIOGEL PI INDICATOR 6.5 (GLOVE) ×2
GLOVE BIOGEL PI INDICATOR 8 (GLOVE) ×2
GLOVE ECLIPSE 7.5 STRL STRAW (GLOVE) ×2 IMPLANT
GLOVE EUDERMIC 7 POWDERFREE (GLOVE) ×6 IMPLANT
GLOVE SURG SS PI 6.5 STRL IVOR (GLOVE) ×2 IMPLANT
GOWN STRL REUS W/ TWL LRG LVL3 (GOWN DISPOSABLE) ×1 IMPLANT
GOWN STRL REUS W/ TWL XL LVL3 (GOWN DISPOSABLE) ×1 IMPLANT
GOWN STRL REUS W/TWL LRG LVL3 (GOWN DISPOSABLE) ×3
GOWN STRL REUS W/TWL XL LVL3 (GOWN DISPOSABLE) ×6
KIT BASIN OR (CUSTOM PROCEDURE TRAY) ×3 IMPLANT
KIT MARKER MARGIN INK (KITS) ×3 IMPLANT
NEEDLE HYPO 25GX1X1/2 BEV (NEEDLE) ×3 IMPLANT
NS IRRIG 1000ML POUR BTL (IV SOLUTION) ×3 IMPLANT
PACK SURGICAL SETUP 50X90 (CUSTOM PROCEDURE TRAY) ×3 IMPLANT
PAD ABD 8X10 STRL (GAUZE/BANDAGES/DRESSINGS) ×2 IMPLANT
PENCIL BUTTON HOLSTER BLD 10FT (ELECTRODE) ×3 IMPLANT
SPONGE LAP 4X18 RFD (DISPOSABLE) ×6 IMPLANT
SUT MNCRL AB 4-0 PS2 18 (SUTURE) ×3 IMPLANT
SUT SILK 2 0 SH (SUTURE) ×3 IMPLANT
SUT VIC AB 3-0 SH 18 (SUTURE) ×3 IMPLANT
SYR BULB 3OZ (MISCELLANEOUS) ×3 IMPLANT
SYR CONTROL 10ML LL (SYRINGE) ×3 IMPLANT
TOWEL GREEN STERILE FF (TOWEL DISPOSABLE) ×2 IMPLANT
TUBE CONNECTING 12'X1/4 (SUCTIONS) ×1
TUBE CONNECTING 12X1/4 (SUCTIONS) ×2 IMPLANT
YANKAUER SUCT BULB TIP NO VENT (SUCTIONS) ×3 IMPLANT

## 2018-08-31 NOTE — Anesthesia Procedure Notes (Signed)
Procedure Name: LMA Insertion Date/Time: 08/31/2018 11:07 AM Performed by: Imagene Riches, CRNA Pre-anesthesia Checklist: Patient identified, Emergency Drugs available, Suction available and Patient being monitored Patient Re-evaluated:Patient Re-evaluated prior to induction Oxygen Delivery Method: Circle System Utilized Preoxygenation: Pre-oxygenation with 100% oxygen Induction Type: IV induction Ventilation: Mask ventilation without difficulty LMA: LMA inserted LMA Size: 4.0 Number of attempts: 1 Airway Equipment and Method: Bite block Placement Confirmation: positive ETCO2 Tube secured with: Tape Dental Injury: Teeth and Oropharynx as per pre-operative assessment

## 2018-08-31 NOTE — Anesthesia Postprocedure Evaluation (Signed)
Anesthesia Post Note  Patient: Pamela Hendricks  Procedure(s) Performed: BREAST LUMPECTOMY WITH RADIOACTIVE SEED LOCALIZATION (Left Breast)     Patient location during evaluation: PACU Anesthesia Type: General Level of consciousness: awake and alert Pain management: pain level controlled Vital Signs Assessment: post-procedure vital signs reviewed and stable Respiratory status: spontaneous breathing, nonlabored ventilation, respiratory function stable and patient connected to nasal cannula oxygen Cardiovascular status: blood pressure returned to baseline and stable Postop Assessment: no apparent nausea or vomiting Anesthetic complications: no    Last Vitals:  Vitals:   08/31/18 1251 08/31/18 1313  BP: (!) 106/55 (!) 122/58  Pulse: 60 76  Resp: 12 13  Temp: (!) 36.4 C (!) 36.4 C  SpO2: 97% 98%    Last Pain:  Vitals:   08/31/18 1252  TempSrc:   PainSc: 0-No pain                 Barnet Glasgow

## 2018-08-31 NOTE — Discharge Instructions (Signed)
[facility] °Office Phone Number [phone] ° °BREAST BIOPSY/ PARTIAL MASTECTOMY: POST OP INSTRUCTIONS ° °Always review your discharge instruction sheet given to you by the facility where your surgery was performed. ° °IF YOU HAVE DISABILITY OR FAMILY LEAVE FORMS, YOU MUST BRING THEM TO THE OFFICE FOR PROCESSING.  DO NOT GIVE THEM TO YOUR DOCTOR. ° °1. A prescription for pain medication may be given to you upon discharge.  Take your pain medication as prescribed, if needed.  If narcotic pain medicine is not needed, then you may take acetaminophen (Tylenol) or ibuprofen (Advil) as needed. °2. Take your usually prescribed medications unless otherwise directed °3. If you need a refill on your pain medication, please contact your pharmacy.  They will contact our office to request authorization.  Prescriptions will not be filled after 5pm or on week-ends. °4. You should eat very light the first 24 hours after surgery, such as soup, crackers, pudding, etc.  Resume your normal diet the day after surgery. °5. Most patients will experience some swelling and bruising in the breast.  Ice packs and a good support bra will help.  Swelling and bruising can take several days to resolve.  °6. It is common to experience some constipation if taking pain medication after surgery.  Increasing fluid intake and taking a stool softener will usually help or prevent this problem from occurring.  A mild laxative (Milk of Magnesia or Miralax) should be taken according to package directions if there are no bowel movements after 48 hours. °7. Unless discharge instructions indicate otherwise, you may remove your bandages 24-48 hours after surgery, and you may shower at that time.  You may have steri-strips (small skin tapes) in place directly over the incision.  These strips should be left on the skin for 7-10 days.  If your surgeon used skin glue on the incision, you may shower in 24 hours.  The glue will flake off over the  next 2-3 weeks.  Any sutures or staples will be removed at the office during your follow-up visit. °8. ACTIVITIES:  You may resume regular daily activities (gradually increasing) beginning the next day.  Wearing a good support bra or sports bra minimizes pain and swelling.  You may have sexual intercourse when it is comfortable. °a. You may drive when you no longer are taking prescription pain medication, you can comfortably wear a seatbelt, and you can safely maneuver your car and apply brakes. °b. RETURN TO WORK:  ______________________________________________________________________________________ °9. You should see your doctor in the office for a follow-up appointment approximately two weeks after your surgery.  Your doctor’s nurse will typically make your follow-up appointment when she calls you with your pathology report.  Expect your pathology report 2-3 business days after your surgery.  You may call to check if you do not hear from us after three days. °10. OTHER INSTRUCTIONS: _______________________________________________________________________________________________ _____________________________________________________________________________________________________________________________________ °_____________________________________________________________________________________________________________________________________ °_____________________________________________________________________________________________________________________________________ ° °WHEN TO CALL YOUR DOCTOR: °1. Fever over 101.0 °2. Nausea and/or vomiting. °3. Extreme swelling or bruising. °4. Continued bleeding from incision. °5. Increased pain, redness, or drainage from the incision. ° °The clinic staff is available to answer your questions during regular business hours.  Please don’t hesitate to call and ask to speak to one of the nurses for clinical concerns.  If you have a medical emergency, go to the nearest  emergency room or call 911.  A surgeon from [facility] is always on call at the hospital. ° °For further questions, please visit [email or web address]  °

## 2018-08-31 NOTE — Transfer of Care (Signed)
Immediate Anesthesia Transfer of Care Note  Patient: Pamela Hendricks  Procedure(s) Performed: BREAST LUMPECTOMY WITH RADIOACTIVE SEED LOCALIZATION (Left Breast)  Patient Location: PACU  Anesthesia Type:General  Level of Consciousness: awake, alert  and oriented  Airway & Oxygen Therapy: Patient Spontanous Breathing  Post-op Assessment: Report given to RN and Post -op Vital signs reviewed and stable  Post vital signs: Reviewed and stable  Last Vitals:  Vitals Value Taken Time  BP    Temp    Pulse 69 08/31/2018 12:11 PM  Resp    SpO2 99 % 08/31/2018 12:11 PM  Vitals shown include unvalidated device data.  Last Pain:  Vitals:   08/31/18 0924  TempSrc: Oral         Complications: No apparent anesthesia complications

## 2018-08-31 NOTE — Op Note (Signed)
Patient Name:           Pamela Hendricks   Date of Surgery:        08/31/2018  Pre op Diagnosis:      Complex sclerosing lesion left breast  Post op Diagnosis:    Same  Procedure:                 Left breast lumpectomy with radioactive seed localization and margin assessment  Surgeon:                     Edsel Petrin. Dalbert Batman, M.D., FACS  Assistant:                      OR staff  Operative Indications:   This is a very pleasant 72 year old female, referred to me by Dr. Enrique Sack at the [facility] for complex sclerosing lesion left breast. Dr.Varadarajan is her PCP.      In 2013 I performed a right lumpectomy for complex sclerosing lesion.  otherwise no prior  breast problems. Recent screening mammogram shows an area of architectural distortion in the left breast upper outer quadrant, posterior depth. Ultrasound of the left breast shows no mass. Ultrasound of the axilla is negative. Image guided biopsy of the left shows CSL.  . I explained that this is similar to 2013. I explained the risk of cancer being somewhere between 5 and 10%. She does not want to take a chance. She wants to go ahead with radioactive seed guided excision. Family history reveals mother died of pneumonia and had hypertension. Father died of myocardial infarction. Sister died of brain cancer. Brother living with prostate cancer. She had a daughter with type 1 diabetes and died at age 45 of myocardial infarction.     She will be scheduled for left breast lumpectomy with radioactive seed localization.  Operative Findings:       Radioactive seed localization was performed by Dr. Owens Shark yesterday.  She found that the original biopsy clip had migrated 4.4 cm medially from the targeted distortion.  Therefore the radioactive seed was placed in the targeted area of distortion.  The radioactive seed was found very deep in the lateral left breast.  The specimen mammogram looked good and contained the seed.  There is no gross evidence  of cancer.  Procedure in Detail:          Following the induction of general LMA anesthesia the patient's left breast and axilla were prepped and draped in a sterile fashion.  Intravenous antibiotics were given.  Surgical timeout was performed.  0.5% Marcaine with epinephrine was used as a local infiltration anesthetic.  Using the neoprobe I identified the area in the lateral deep left breast.  A curvilinear incision was made laterally in the upper outer quadrant of the left breast.  Dissection was carried deep into the breast tissues until we excised the radioactive signal.  The specimen was marked with silk sutures and a 6 color ink kit to orient the pathologist.  The specimen mammogram looked good.  Specimen was sent to the lab where the seed was retrieved.  Hemostasis was excellent and achieved with electrocautery.  The wound was copiously irrigated.  5 metal marker clips were placed in the walls of the lumpectomy cavity.  The breast tissues were closed in several layers with interrupted sutures of 3-0 Vicryl and the skin closed with a running subcuticular 4-0 Monocryl and Dermabond.  Patient tolerated the procedure well  and was taken to recovery room in stable condition.  EBL 20 cc or less.  Counts correct.  Complications none.    Addendum: I logged onto the [employer] and reviewed her prescription medication history     [name] M. Dalbert Batman, M.D., FACS General and Minimally Invasive Surgery Breast and Colorectal Surgery  08/31/2018 12:04 PM

## 2018-08-31 NOTE — Interval H&P Note (Signed)
History and Physical Interval Note:  08/31/2018 9:24 AM  Pamela Hendricks  has presented today for surgery, with the diagnosis of COMPLEX SCLEROSING LESION LEFT BREAST  The various methods of treatment have been discussed with the patient and family. After consideration of risks, benefits and other options for treatment, the patient has consented to  Procedure(s): BREAST LUMPECTOMY WITH RADIOACTIVE SEED LOCALIZATION (Left) as a surgical intervention .  The patient's history has been reviewed, patient examined, no change in status, stable for surgery.  I have reviewed the patient's chart and labs.  Questions were answered to the patient's satisfaction.     Adin Hector

## 2018-09-01 ENCOUNTER — Encounter (HOSPITAL_COMMUNITY): Payer: Self-pay | Admitting: General Surgery

## 2018-09-01 NOTE — Progress Notes (Signed)
Inform patient of Pathology report,.  Final breast pathology shows complex sclerosing lesion There is no evidence of cancer Obviously good days Let me noted you reached her  hmi

## 2018-09-17 ENCOUNTER — Telehealth: Payer: Self-pay | Admitting: *Deleted

## 2018-09-17 NOTE — Telephone Encounter (Signed)
   [facility] Pre-operative Risk Assessment    Request for surgical clearance:  1. What type of surgery is being performed? Right total knee    2. When is this surgery scheduled? 12/3/9   3. What type of clearance is required (medical clearance vs. Pharmacy clearance to hold med vs. Both)?  both  4. Are there any medications that need to be held prior to surgery and how long? aspirin   5. Practice name and name of physician performing surgery?  Not noted.Pamela Hendricks by staff messae   6. What is your office phone number    7.   What is your office fax number   8.   Anesthesia type (None, local, MAC, general) ?  Spinal Anesthesia   [name] 09/17/2018, 3:59 PM  _________________________________________________________________   (provider comments below)

## 2018-09-17 NOTE — Telephone Encounter (Signed)
-----   Message from Charlie Pitter, [place] sent at 09/17/2018  3:17 PM EST ----- Regarding: FW: SURGERY CLEARANCE Contact: [phone] I already responded to both of these in a staff message as to how to avoid using staff messages but can you enter in a phone call?  ----- Message ----- From: Jerline Pain, MD Sent: 09/17/2018   2:35 PM EST To: Cv Div Preop Subject: FW: SURGERY CLEARANCE                            ----- Message ----- From: Sandi Raveling Sent: 09/14/2018  11:17 AM EST To: Jerline Pain, MD Subject: SURGERY CLEARANCE                              Patient scheduled for Right Total Knee Spinal Anesthesia, has been advised she can stay on her ASA 81mg  surgery on 10/12/2018 need Cardiac clearance please.

## 2018-09-17 NOTE — Telephone Encounter (Signed)
Jerline Pain, MD  P Cv Div Preop  Phone Number:  [phone] (Call me)      Previous Messages    ----- Message -----  From: Sandi Raveling  Sent: 09/14/2018 11:17 AM EST  To: Jerline Pain, MD  Subject: SURGERY CLEARANCE                 Patient scheduled for Right Total Knee Spinal Anesthesia, has been advised she can stay on her ASA 81mg  surgery on 10/12/2018 need Cardiac clearance please.

## 2018-09-20 NOTE — Telephone Encounter (Signed)
   Primary Cardiologist: Dr. Marlou Porch  Chart reviewed as part of pre-operative protocol coverage. Given past medical history and time since last visit, based on ACC/AHA guidelines, ARABIA NYLUND would be at acceptable risk for the planned procedure without further cardiovascular testing.   I will route this recommendation to the requesting party via Epic fax function and remove from pre-op pool.  Please call with questions.  [place], [place] 09/20/2018, 3:10 PM

## 2018-09-27 ENCOUNTER — Other Ambulatory Visit: Payer: Self-pay | Admitting: Orthopaedic Surgery

## 2018-09-30 NOTE — Pre-Procedure Instructions (Signed)
Pamela Hendricks  09/30/2018      [facility] [address] [place], [place] - [address] [place] [address] Phone: [phone] Fax: [phone]  [id number] [place], [place] [address] [place] [address] Phone: [phone] Fax: [phone]    Your procedure is scheduled on December 3rd.  Report to [facility] Admitting at 0800 A.M.  Call this number if you have problems the morning of surgery:  [phone]   Remember:  Do not eat or drink after midnight.    Take these medicines the morning of surgery with A SIP OF WATER   cetirizine (ZYRTEC)  fluticasone (FLONASE)  pantoprazole (PROTONIX)  7 days prior to surgery STOP taking any Aspirin(unless otherwise instructed by your surgeon), Aleve, Naproxen, Ibuprofen, Motrin, Advil, Goody's, BC's, all herbal medications, fish oil, and all vitamins     Do not wear jewelry, make-up or nail polish.  Do not wear lotions, powders, or perfumes, or deodorant.  Do not shave 48 hours prior to surgery.  Men may shave face and neck.  Do not bring valuables to the hospital.  [facility] is not responsible for any belongings or valuables.  Contacts, dentures or bridgework may not be worn into surgery.  Leave your suitcase in the car.  After surgery it may be brought to your room.  For patients admitted to the hospital, discharge time will be determined by your treatment team.  Patients discharged the day of surgery will not be allowed to drive home.    [facility]- Preparing For Surgery  Before surgery, you can play an important role. Because skin is not sterile, your skin needs to be as free of germs as possible. You can reduce the number of germs on your skin by washing with CHG (chlorahexidine gluconate) Soap before surgery.  CHG is an antiseptic cleaner which kills germs and bonds with the skin to  continue killing germs even after washing.    Oral Hygiene is also important to reduce your risk of infection.  Remember - BRUSH YOUR TEETH THE MORNING OF SURGERY WITH YOUR REGULAR TOOTHPASTE  Please do not use if you have an allergy to CHG or antibacterial soaps. If your skin becomes reddened/irritated stop using the CHG.  Do not shave (including legs and underarms) for at least 48 hours prior to first CHG shower. It is OK to shave your face.  Please follow these instructions carefully.   1. Shower the NIGHT BEFORE SURGERY and the MORNING OF SURGERY with CHG.   2. If you chose to wash your hair, wash your hair first as usual with your normal shampoo.  3. After you shampoo, rinse your hair and body thoroughly to remove the shampoo.  4. Use CHG as you would any other liquid soap. You can apply CHG directly to the skin and wash gently with a scrungie or a clean washcloth.   5. Apply the CHG Soap to your body ONLY FROM THE NECK DOWN.  Do not use on open wounds or open sores. Avoid contact with your eyes, ears, mouth and genitals (private parts). Wash Face and genitals (private parts)  with your normal soap.  6. Wash thoroughly, paying special attention to the area where your surgery will be performed.  7. Thoroughly rinse your body with warm water from the neck down.  8. DO  NOT shower/wash with your normal soap after using and rinsing off the CHG Soap.  9. Pat yourself dry with a CLEAN TOWEL.  10. Wear CLEAN PAJAMAS to bed the night before surgery, wear comfortable clothes the morning of surgery  11. Place CLEAN SHEETS on your bed the night of your first shower and DO NOT SLEEP WITH PETS.    Day of Surgery:  Do not apply any deodorants/lotions.  Please wear clean clothes to the hospital/surgery center.   Remember to brush your teeth WITH YOUR REGULAR TOOTHPASTE.    Please read over the following fact sheets that you were given.

## 2018-10-01 ENCOUNTER — Ambulatory Visit (HOSPITAL_COMMUNITY)
Admission: RE | Admit: 2018-10-01 | Discharge: 2018-10-01 | Disposition: A | Discharge status: Home / Self Care | Payer: Medicare Other | Source: Ambulatory Visit | Attending: Orthopaedic Surgery | Admitting: Orthopaedic Surgery

## 2018-10-01 ENCOUNTER — Encounter (HOSPITAL_COMMUNITY)
Admission: RE | Admit: 2018-10-01 | Discharge: 2018-10-01 | Disposition: A | Discharge status: Home / Self Care | Payer: Medicare Other | Source: Ambulatory Visit | Attending: Orthopaedic Surgery | Admitting: Orthopaedic Surgery

## 2018-10-01 ENCOUNTER — Inpatient Hospital Stay (HOSPITAL_COMMUNITY): Admission: RE | Admit: 2018-10-01 | Payer: Medicare Other | Source: Ambulatory Visit

## 2018-10-01 ENCOUNTER — Other Ambulatory Visit (HOSPITAL_COMMUNITY): Payer: Medicare Other

## 2018-10-01 ENCOUNTER — Encounter (HOSPITAL_COMMUNITY): Payer: Self-pay

## 2018-10-01 ENCOUNTER — Other Ambulatory Visit: Payer: Self-pay

## 2018-10-01 DIAGNOSIS — Z01818 Encounter for other preprocedural examination: Secondary | ICD-10-CM | POA: Insufficient documentation

## 2018-10-01 HISTORY — DX: Peripheral vascular disease, unspecified: I73.9

## 2018-10-01 LAB — CBC WITH DIFFERENTIAL/PLATELET
ABS IMMATURE GRANULOCYTES: 0.02 10*3/uL (ref 0.00–0.07)
BASOS ABS: 0.1 10*3/uL (ref 0.0–0.1)
Basophils Relative: 1 %
EOS PCT: 3 %
Eosinophils Absolute: 0.2 10*3/uL (ref 0.0–0.5)
HEMATOCRIT: 39.3 % (ref 36.0–46.0)
HEMOGLOBIN: 12.1 g/dL (ref 12.0–15.0)
Immature Granulocytes: 0 %
LYMPHS ABS: 1.6 10*3/uL (ref 0.7–4.0)
Lymphocytes Relative: 25 %
MCH: 29.4 pg (ref 26.0–34.0)
MCHC: 30.8 g/dL (ref 30.0–36.0)
MCV: 95.4 fL (ref 80.0–100.0)
MONO ABS: 0.6 10*3/uL (ref 0.1–1.0)
Monocytes Relative: 9 %
NEUTROS ABS: 4.1 10*3/uL (ref 1.7–7.7)
Neutrophils Relative %: 62 %
Platelets: 256 10*3/uL (ref 150–400)
RBC: 4.12 MIL/uL (ref 3.87–5.11)
RDW: 14 % (ref 11.5–15.5)
WBC: 6.5 10*3/uL (ref 4.0–10.5)
nRBC: 0 % (ref 0.0–0.2)

## 2018-10-01 LAB — ABO/RH: ABO/RH(D): A POS

## 2018-10-01 LAB — PROTIME-INR
INR: 1.06
Prothrombin Time: 13.7 seconds (ref 11.4–15.2)

## 2018-10-01 LAB — URINALYSIS, ROUTINE W REFLEX MICROSCOPIC
BILIRUBIN URINE: NEGATIVE
GLUCOSE, UA: NEGATIVE mg/dL
Hgb urine dipstick: NEGATIVE
KETONES UR: NEGATIVE mg/dL
LEUKOCYTES UA: NEGATIVE
Nitrite: NEGATIVE
PH: 7 (ref 5.0–8.0)
PROTEIN: NEGATIVE mg/dL
Specific Gravity, Urine: 1.009 (ref 1.005–1.030)

## 2018-10-01 LAB — BASIC METABOLIC PANEL
Anion gap: 9 (ref 5–15)
BUN: 8 mg/dL (ref 8–23)
CO2: 25 mmol/L (ref 22–32)
CREATININE: 0.66 mg/dL (ref 0.44–1.00)
Calcium: 8.9 mg/dL (ref 8.9–10.3)
Chloride: 105 mmol/L (ref 98–111)
GFR calc Af Amer: >60 mL/min (ref >60)
GFR calc non Af Amer: >60 mL/min (ref >60)
Glucose, Bld: 88 mg/dL (ref 70–99)
POTASSIUM: 3.5 mmol/L (ref 3.5–5.1)
Sodium: 139 mmol/L (ref 135–145)

## 2018-10-01 LAB — SURGICAL PCR SCREEN
MRSA, PCR: NEGATIVE
Staphylococcus aureus: POSITIVE — AB

## 2018-10-01 LAB — TYPE AND SCREEN
ABO/RH(D): A POS
ANTIBODY SCREEN: NEGATIVE

## 2018-10-01 LAB — APTT: aPTT: 28 seconds (ref 24–36)

## 2018-10-01 NOTE — Pre-Procedure Instructions (Addendum)
Pamela Hendricks  10/01/2018    Your procedure is scheduled on Tuesday, October 12, 2018 at 10:00 AM.   Report to [facility] Entrance "A" Admitting Office at 8:00 AM.   Call this number if you have problems the morning of surgery: [phone]   Questions prior to day of surgery, please call [phone] between 8 & 4 PM.   Remember:  Do not eat or drink after midnight Monday, 10/11/18.  Take these medicines the morning of surgery with A SIP OF WATER: Aspirin, Cetirizine (Zyrtec), Pantoprazole (Protonix)  Do not use NSAIDS (Ibuprofen, Aleve, etc), other Aspirin products (Goody's, BC Powders, etc) or Herbal medications 7 days prior to surgery.    Do not wear jewelry, make-up or nail polish.  Do not wear lotions, powders, perfumes or deodorant.  Do not shave 48 hours prior to surgery.   Do not bring valuables to the hospital.  [facility] is not responsible for any belongings or valuables.  Contacts, dentures or bridgework may not be worn into surgery.  Leave your suitcase in the car.  After surgery it may be brought to your room.  For patients admitted to the hospital, discharge time will be determined by your treatment team.  [facility] - Preparing for Surgery  Before surgery, you can play an important role.  Because skin is not sterile, your skin needs to be as free of germs as possible.  You can reduce the number of germs on you skin by washing with CHG (chlorahexidine gluconate) soap before surgery.  CHG is an antiseptic cleaner which kills germs and bonds with the skin to continue killing germs even after washing.  Oral Hygiene is also important in reducing the risk of infection.  Remember to brush your teeth with your regular toothpaste the morning of surgery.  Please DO NOT use if you have an allergy to CHG or antibacterial soaps.  If your skin becomes reddened/irritated stop using the CHG and inform your nurse when you arrive at Short Stay.  Do not shave (including  legs and underarms) for at least 48 hours prior to the first CHG shower.  You may shave your face.  Please follow these instructions carefully:   1.  Shower with CHG Soap the night before surgery and the morning of Surgery.  2.  If you choose to wash your hair, wash your hair first as usual with your normal shampoo.  3.  After you shampoo, rinse your hair and body thoroughly to remove the shampoo. 4.  Use CHG as you would any other liquid soap.  You can apply chg directly to the skin and wash gently with a      scrungie or washcloth.           5.  Apply the CHG Soap to your body ONLY FROM THE NECK DOWN.   Do not use on open wounds or open sores. Avoid contact with your eyes, ears, mouth and genitals (private parts).  Wash genitals (private parts) with your normal soap.  6.  Wash thoroughly, paying special attention to the area where your surgery will be performed.  7.  Thoroughly rinse your body with warm water from the neck down.  8.  DO NOT shower/wash with your normal soap after using and rinsing off the CHG Soap.  9.  Pat yourself dry with a clean towel.            10.  Wear clean pajamas.  11.  Place clean sheets on your bed the night of your first shower and do not sleep with pets.  Day of Surgery  Shower as above. Do not apply any lotions/deodorants the morning of surgery.   Please wear clean clothes to the hospital. Remember to brush your teeth with toothpaste.   Please read over the fact sheets that you were given.

## 2018-10-01 NOTE — Progress Notes (Signed)
Pt sees Dr. Marlou Porch for some carotid artery blockage. Denies any other cardiac history. Pt states she is not diabetic. Pt was instructed by Dr. Rhona Raider to stay on her Aspirin.

## 2018-10-01 NOTE — Progress Notes (Signed)
Mupirocin Ointment Rx called into [facility] on [address] [place] for positive PCR of Staph. Pt notified and voiced understanding.

## 2018-10-04 ENCOUNTER — Other Ambulatory Visit: Payer: Self-pay | Admitting: Orthopaedic Surgery

## 2018-10-11 MED ORDER — TRANEXAMIC ACID 1000 MG/10ML IV SOLN
2000.0000 mg | INTRAVENOUS | Status: DC
  Filled 2018-10-11: qty 20

## 2018-10-11 MED ORDER — BUPIVACAINE LIPOSOME 1.3 % IJ SUSP
20.0000 mL | INTRAMUSCULAR | Status: DC
  Filled 2018-10-11: qty 20

## 2018-10-11 MED ORDER — TRANEXAMIC ACID-NACL 1000-0.7 MG/100ML-% IV SOLN
1000.0000 mg | INTRAVENOUS | Status: AC
  Administered 2018-10-12: 1000 mg via INTRAVENOUS
  Filled 2018-10-11 (×2): qty 100

## 2018-10-11 NOTE — H&P (Signed)
TOTAL KNEE ADMISSION H&P  Patient is being admitted for right total knee arthroplasty.  Subjective:  Chief Complaint:right knee pain.  HPI: Pamela Hendricks, 72 y.o. female, has a history of pain and functional disability in the right knee due to arthritis and has failed non-surgical conservative treatments for greater than 12 weeks to includeNSAID's and/or analgesics, corticosteriod injections, flexibility and strengthening excercises, use of assistive devices, weight reduction as appropriate and activity modification.  Onset of symptoms was gradual, starting 5 years ago with gradually worsening course since that time. The patient noted no past surgery on the right knee(s).  Patient currently rates pain in the right knee(s) at 10 out of 10 with activity. Patient has night pain, worsening of pain with activity and weight bearing, pain that interferes with activities of daily living, crepitus and joint swelling.  Patient has evidence of subchondral cysts, subchondral sclerosis, periarticular osteophytes and joint space narrowing by imaging studies.  There is no active infection.  Patient Active Problem List   Diagnosis Date Noted  . Obstructive sleep apnea treated with continuous positive airway pressure (CPAP) 01/28/2018  . OSA (obstructive sleep apnea) 08/27/2017  . Excessive daytime sleepiness 08/27/2017  . Paronychia of toe 06/16/2016  . Chronic sphenoidal sinusitis 08/13/2015  . Migraine 11/22/2014  . Sleep apnea 11/22/2014  . Abnormal MRI of head 07/04/2013  . Headache(784.0) 03/29/2013  . Peripheral neuropathy   . Major depressive disorder   . Abnormal mammogram 06/10/2012   Past Medical History:  Diagnosis Date  . Arthritis   . Carotid artery disease ([place])    a. duplex 07/2017 - 1-39% stenosis bilaterally.  . Environmental and seasonal allergies   . GERD (gastroesophageal reflux disease)   . Hyperlipidemia   . Hypertension   . Migraines    sinus headaches, no longer having  migraines  . OSA (obstructive sleep apnea)    CPAP  . Peripheral neuropathy   . Peripheral vascular disease ([place])    carotid blockage - is followed by Dr. Marlou Porch  . Snores   . Wears glasses     Past Surgical History:  Procedure Laterality Date  . ABDOMINAL HYSTERECTOMY  1970   Total HYST  . BREAST BIOPSY Bilateral 2001   benign  . BREAST EXCISIONAL BIOPSY Right 2013   sclerosing lesion  . BREAST LUMPECTOMY WITH RADIOACTIVE SEED LOCALIZATION Left 08/31/2018   Procedure: BREAST LUMPECTOMY WITH RADIOACTIVE SEED LOCALIZATION;  Surgeon: Fanny Skates, MD;  Location: [facility];  Service: General;  Laterality: Left;  . COLONOSCOPY    . JOINT REPLACEMENT Left 2001   Left Knee Replacement  . KNEE ARTHROSCOPY Left    lt  . NASAL SINUS SURGERY    . WISDOM TOOTH EXTRACTION      No current facility-administered medications for this encounter.    Current Outpatient Medications  Medication Sig Dispense Refill Last Dose  . aspirin EC 81 MG tablet Take 81 mg by mouth daily.   Past Week at Unknown time  . atorvastatin (LIPITOR) 10 MG tablet Take 10 mg by mouth at bedtime.    08/30/2018 at Unknown time  . benazepril (LOTENSIN) 40 MG tablet Take 40 mg by mouth daily.    08/30/2018 at Unknown time  . Calcium Carb-Cholecalciferol (CALCIUM 600+D3 PO) Take 1 tablet by mouth 2 (two) times daily.   08/30/2018 at Unknown time  . cetirizine (ZYRTEC) 10 MG tablet Take 10 mg by mouth daily.   08/30/2018 at Unknown time  . fluticasone (FLONASE) 50 MCG/ACT nasal  spray Place 2 sprays into both nostrils every other day.    08/30/2018 at Unknown time  . furosemide (LASIX) 40 MG tablet Take 20 mg by mouth daily.    08/30/2018 at Unknown time  . nortriptyline (PAMELOR) 50 MG capsule Take 50 mg by mouth at bedtime.   1 08/30/2018 at Unknown time  . pantoprazole (PROTONIX) 40 MG tablet Take 40 mg by mouth daily.    08/30/2018 at Unknown time  . propranolol ER (INDERAL LA) 60 MG 24 hr capsule Take 1 capsule (60 mg  total) by mouth at bedtime. Please call [phone] to schedule appt. 30 capsule 2 08/30/2018 at Unknown time  . HYDROcodone-acetaminophen (NORCO) 5-325 MG tablet Take 1-2 tablets by mouth every 6 (six) hours as needed for moderate pain or severe pain. (Patient not taking: Reported on 09/28/2018) 20 tablet 0 Completed Course at Unknown time   Allergies  Allergen Reactions  . Other Other (See Comments)    Watery eyes , itching - Grass, Rag Weed, Mold, Smoke    Social History   Tobacco Use  . Smoking status: Never Smoker  . Smokeless tobacco: Never Used  Substance Use Topics  . Alcohol use: No    Family History  Problem Relation Age of Onset  . Cancer Mother   . Hypertension Mother   . Hypertension Father   . Heart disease Father        MI around 11, died at 97  . Cancer Brother        Prostate Ca  . Cancer Sister        Brain Ca  . CAD Daughter        Had bypass in her 86s (type 1 diabetic), died at 51     Review of Systems  Musculoskeletal: Positive for joint pain.       Right knee  All other systems reviewed and are negative.   Objective:  Physical Exam  Constitutional: She is oriented to person, place, and time. She appears well-developed and well-nourished.  HENT:  Head: Normocephalic and atraumatic.  Eyes: Pupils are equal, round, and reactive to light.  Neck: Normal range of motion.  Cardiovascular: Normal rate and regular rhythm.  Respiratory: Effort normal.  GI: Soft.  Musculoskeletal:  Right knee motion is about 0-110.  She has a moderate valgus deformity.  She has more lateral than medial joint line pain with crepitation but no effusion and I see no scars.  Hip motion is full and straight leg raise is negative.  Sensation and motor function are intact in her feet with palpable pulses on both sides.  Neurological: She is alert and oriented to person, place, and time.  Skin: Skin is warm and dry.  Psychiatric: She has a normal mood and affect. Her behavior is  normal. Judgment and thought content normal.    Vital signs in last 24 hours:    Labs:   Estimated body mass index is 35.48 kg/m as calculated from the following:   Height as of 10/01/18: 5\' 5"  (1.651 m).   Weight as of 10/01/18: 96.7 kg.   Imaging Review Plain radiographs demonstrate severe degenerative joint disease of the right knee(s). The overall alignment isneutral. The bone quality appears to be good for age and reported activity level.   Preoperative templating of the joint replacement has been completed, documented, and submitted to the Operating Room personnel in order to optimize intra-operative equipment management.    Patient's anticipated LOS is less than 2 midnights,  meeting these requirements: - Younger than 79 - Lives within 1 hour of care - Has a competent adult at home to recover with post-op recover - NO history of  - Chronic pain requiring opiods  - Diabetes  - Coronary Artery Disease  - Heart failure  - Heart attack  - Stroke  - DVT/VTE  - Cardiac arrhythmia  - Respiratory Failure/COPD  - Renal failure  - Anemia  - Advanced Liver disease  Assessment/Plan:  End stage primary arthritis, right knee   The patient history, physical examination, clinical judgment of the provider and imaging studies are consistent with end stage degenerative joint disease of the right knee(s) and total knee arthroplasty is deemed medically necessary. The treatment options including medical management, injection therapy arthroscopy and arthroplasty were discussed at length. The risks and benefits of total knee arthroplasty were presented and reviewed. The risks due to aseptic loosening, infection, stiffness, patella tracking problems, thromboembolic complications and other imponderables were discussed. The patient acknowledged the explanation, agreed to proceed with the plan and consent was signed. Patient is being admitted for inpatient treatment for surgery, pain control,  PT, OT, prophylactic antibiotics, VTE prophylaxis, progressive ambulation and ADL's and discharge planning. The patient is planning to be discharged home with home health services

## 2018-10-12 ENCOUNTER — Ambulatory Visit (HOSPITAL_COMMUNITY): Payer: Medicare Other | Admitting: Certified Registered"

## 2018-10-12 ENCOUNTER — Observation Stay (HOSPITAL_COMMUNITY)
Admission: RE | Admit: 2018-10-12 | Discharge: 2018-10-13 | Disposition: A | Discharge status: Home / Self Care | Payer: Medicare Other | Source: Ambulatory Visit | Attending: Orthopaedic Surgery | Admitting: Orthopaedic Surgery

## 2018-10-12 ENCOUNTER — Encounter (HOSPITAL_COMMUNITY): Payer: Self-pay | Admitting: *Deleted

## 2018-10-12 ENCOUNTER — Encounter (HOSPITAL_COMMUNITY)
Admission: RE | Disposition: A | Discharge status: Home / Self Care | Payer: Self-pay | Source: Ambulatory Visit | Attending: Orthopaedic Surgery

## 2018-10-12 DIAGNOSIS — M1711 Unilateral primary osteoarthritis, right knee: Principal | ICD-10-CM | POA: Diagnosis present

## 2018-10-12 DIAGNOSIS — Z7982 Long term (current) use of aspirin: Secondary | ICD-10-CM | POA: Insufficient documentation

## 2018-10-12 DIAGNOSIS — G629 Polyneuropathy, unspecified: Secondary | ICD-10-CM | POA: Insufficient documentation

## 2018-10-12 DIAGNOSIS — I739 Peripheral vascular disease, unspecified: Secondary | ICD-10-CM | POA: Diagnosis not present

## 2018-10-12 DIAGNOSIS — Z96652 Presence of left artificial knee joint: Secondary | ICD-10-CM | POA: Diagnosis not present

## 2018-10-12 DIAGNOSIS — Z79899 Other long term (current) drug therapy: Secondary | ICD-10-CM | POA: Insufficient documentation

## 2018-10-12 DIAGNOSIS — G4733 Obstructive sleep apnea (adult) (pediatric): Secondary | ICD-10-CM | POA: Diagnosis not present

## 2018-10-12 HISTORY — PX: TOTAL KNEE ARTHROPLASTY: SHX125

## 2018-10-12 SURGERY — ARTHROPLASTY, KNEE, TOTAL
Anesthesia: Spinal | Site: Knee | Laterality: Right

## 2018-10-12 MED ORDER — ALUM & MAG HYDROXIDE-SIMETH 200-200-20 MG/5ML PO SUSP: 30.0000 mL | ORAL | Status: DC | PRN

## 2018-10-12 MED ORDER — BISACODYL 5 MG PO TBEC: 5.0000 mg | DELAYED_RELEASE_TABLET | Freq: Every day | ORAL | Status: DC | PRN

## 2018-10-12 MED ORDER — CEFAZOLIN SODIUM-DEXTROSE 2-4 GM/100ML-% IV SOLN
2.0000 g | Freq: Four times a day (QID) | INTRAVENOUS | Status: AC
  Administered 2018-10-12 – 2018-10-13 (×2): 2 g via INTRAVENOUS
  Filled 2018-10-12 (×2): qty 100

## 2018-10-12 MED ORDER — DIPHENHYDRAMINE HCL 12.5 MG/5ML PO ELIX: 12.5000 mg | ORAL_SOLUTION | ORAL | Status: DC | PRN

## 2018-10-12 MED ORDER — METHOCARBAMOL 500 MG PO TABS
ORAL_TABLET | ORAL | Status: AC
  Filled 2018-10-12: qty 1

## 2018-10-12 MED ORDER — LACTATED RINGERS IV SOLN
INTRAVENOUS | Status: DC
  Administered 2018-10-12: 22:00:00 via INTRAVENOUS

## 2018-10-12 MED ORDER — LORATADINE 10 MG PO TABS
10.0000 mg | ORAL_TABLET | Freq: Every day | ORAL | Status: DC
  Administered 2018-10-12 – 2018-10-13 (×2): 10 mg via ORAL
  Filled 2018-10-12 (×2): qty 1

## 2018-10-12 MED ORDER — PROPOFOL 500 MG/50ML IV EMUL
INTRAVENOUS | Status: DC | PRN
  Administered 2018-10-12: 75 ug/kg/min via INTRAVENOUS

## 2018-10-12 MED ORDER — ROPIVACAINE HCL 5 MG/ML IJ SOLN
INTRAMUSCULAR | Status: DC | PRN
  Administered 2018-10-12: 30 mL via PERINEURAL

## 2018-10-12 MED ORDER — HYDROCODONE-ACETAMINOPHEN 7.5-325 MG PO TABS: 1.0000 | ORAL_TABLET | ORAL | Status: DC | PRN

## 2018-10-12 MED ORDER — LACTATED RINGERS IV SOLN
INTRAVENOUS | Status: DC
  Administered 2018-10-12 (×2): via INTRAVENOUS

## 2018-10-12 MED ORDER — METOCLOPRAMIDE HCL 5 MG PO TABS: 5.0000 mg | ORAL_TABLET | Freq: Three times a day (TID) | ORAL | Status: DC | PRN

## 2018-10-12 MED ORDER — HYDROCODONE-ACETAMINOPHEN 5-325 MG PO TABS
1.0000 | ORAL_TABLET | ORAL | Status: DC | PRN
  Administered 2018-10-12 – 2018-10-13 (×6): 2 via ORAL
  Filled 2018-10-12 (×6): qty 2

## 2018-10-12 MED ORDER — DOCUSATE SODIUM 100 MG PO CAPS
100.0000 mg | ORAL_CAPSULE | Freq: Two times a day (BID) | ORAL | Status: DC
  Administered 2018-10-12 – 2018-10-13 (×2): 100 mg via ORAL
  Filled 2018-10-12 (×2): qty 1

## 2018-10-12 MED ORDER — OXYCODONE HCL 5 MG/5ML PO SOLN: 5.0000 mg | Freq: Once | ORAL | Status: AC | PRN

## 2018-10-12 MED ORDER — CEFAZOLIN SODIUM-DEXTROSE 2-4 GM/100ML-% IV SOLN
INTRAVENOUS | Status: AC
  Filled 2018-10-12: qty 100

## 2018-10-12 MED ORDER — MIDAZOLAM HCL 2 MG/2ML IJ SOLN
INTRAMUSCULAR | Status: AC
  Administered 2018-10-12: 1 mg
  Filled 2018-10-12: qty 2

## 2018-10-12 MED ORDER — MORPHINE SULFATE (PF) 2 MG/ML IV SOLN
0.5000 mg | INTRAVENOUS | Status: DC | PRN
  Filled 2018-10-12: qty 1

## 2018-10-12 MED ORDER — TRANEXAMIC ACID-NACL 1000-0.7 MG/100ML-% IV SOLN
1000.0000 mg | Freq: Once | INTRAVENOUS | Status: AC
  Administered 2018-10-12: 1000 mg via INTRAVENOUS
  Filled 2018-10-12: qty 100

## 2018-10-12 MED ORDER — PROPOFOL 10 MG/ML IV BOLUS
INTRAVENOUS | Status: AC
  Filled 2018-10-12: qty 20

## 2018-10-12 MED ORDER — METHOCARBAMOL 1000 MG/10ML IJ SOLN
500.0000 mg | Freq: Four times a day (QID) | INTRAVENOUS | Status: DC | PRN
  Filled 2018-10-12: qty 5

## 2018-10-12 MED ORDER — ONDANSETRON HCL 4 MG/2ML IJ SOLN: 4.0000 mg | Freq: Once | INTRAMUSCULAR | Status: DC | PRN

## 2018-10-12 MED ORDER — PROPRANOLOL HCL ER 60 MG PO CP24
60.0000 mg | ORAL_CAPSULE | Freq: Every day | ORAL | Status: DC
  Filled 2018-10-12 (×2): qty 1

## 2018-10-12 MED ORDER — FENTANYL CITRATE (PF) 100 MCG/2ML IJ SOLN
INTRAMUSCULAR | Status: AC
  Administered 2018-10-12: 50 ug
  Filled 2018-10-12: qty 2

## 2018-10-12 MED ORDER — KETOROLAC TROMETHAMINE 15 MG/ML IJ SOLN
INTRAMUSCULAR | Status: AC
  Administered 2018-10-12: 15 mg
  Filled 2018-10-12: qty 1

## 2018-10-12 MED ORDER — ASPIRIN EC 325 MG PO TBEC
325.0000 mg | DELAYED_RELEASE_TABLET | Freq: Two times a day (BID) | ORAL | Status: DC
  Administered 2018-10-13: 325 mg via ORAL
  Filled 2018-10-12: qty 1

## 2018-10-12 MED ORDER — ARTIFICIAL TEARS OPHTHALMIC OINT
TOPICAL_OINTMENT | OPHTHALMIC | Status: AC
  Filled 2018-10-12: qty 3.5

## 2018-10-12 MED ORDER — CEFAZOLIN SODIUM-DEXTROSE 2-4 GM/100ML-% IV SOLN
2.0000 g | INTRAVENOUS | Status: AC
  Administered 2018-10-12: 2 g via INTRAVENOUS

## 2018-10-12 MED ORDER — NORTRIPTYLINE HCL 25 MG PO CAPS
50.0000 mg | ORAL_CAPSULE | Freq: Every day | ORAL | Status: DC
  Administered 2018-10-12: 50 mg via ORAL
  Filled 2018-10-12 (×2): qty 2

## 2018-10-12 MED ORDER — PHENOL 1.4 % MT LIQD: 1.0000 | OROMUCOSAL | Status: DC | PRN

## 2018-10-12 MED ORDER — OXYCODONE HCL 5 MG PO TABS
ORAL_TABLET | ORAL | Status: AC
  Filled 2018-10-12: qty 1

## 2018-10-12 MED ORDER — ACETAMINOPHEN 500 MG PO TABS
500.0000 mg | ORAL_TABLET | Freq: Four times a day (QID) | ORAL | Status: DC
  Administered 2018-10-12: 500 mg via ORAL
  Filled 2018-10-12 (×3): qty 1

## 2018-10-12 MED ORDER — BENAZEPRIL HCL 20 MG PO TABS
40.0000 mg | ORAL_TABLET | Freq: Every day | ORAL | Status: DC
  Administered 2018-10-12 – 2018-10-13 (×2): 40 mg via ORAL
  Filled 2018-10-12 (×2): qty 2

## 2018-10-12 MED ORDER — PROPOFOL 10 MG/ML IV BOLUS
INTRAVENOUS | Status: DC | PRN
  Administered 2018-10-12: 20 mg via INTRAVENOUS

## 2018-10-12 MED ORDER — KETOROLAC TROMETHAMINE 15 MG/ML IJ SOLN
7.5000 mg | Freq: Four times a day (QID) | INTRAMUSCULAR | Status: AC
  Administered 2018-10-12 – 2018-10-13 (×4): 7.5 mg via INTRAVENOUS
  Filled 2018-10-12 (×3): qty 1

## 2018-10-12 MED ORDER — METOCLOPRAMIDE HCL 5 MG/ML IJ SOLN: 5.0000 mg | Freq: Three times a day (TID) | INTRAMUSCULAR | Status: DC | PRN

## 2018-10-12 MED ORDER — BUPIVACAINE-EPINEPHRINE (PF) 0.25% -1:200000 IJ SOLN
INTRAMUSCULAR | Status: AC
  Filled 2018-10-12: qty 30

## 2018-10-12 MED ORDER — FUROSEMIDE 20 MG PO TABS
20.0000 mg | ORAL_TABLET | Freq: Every day | ORAL | Status: DC
  Filled 2018-10-12: qty 1

## 2018-10-12 MED ORDER — METHOCARBAMOL 500 MG PO TABS
500.0000 mg | ORAL_TABLET | Freq: Four times a day (QID) | ORAL | Status: DC | PRN
  Administered 2018-10-12 – 2018-10-13 (×3): 500 mg via ORAL
  Filled 2018-10-12 (×2): qty 1

## 2018-10-12 MED ORDER — ONDANSETRON HCL 4 MG PO TABS
4.0000 mg | ORAL_TABLET | Freq: Four times a day (QID) | ORAL | Status: DC | PRN
  Administered 2018-10-13: 4 mg via ORAL
  Filled 2018-10-12: qty 1

## 2018-10-12 MED ORDER — FENTANYL CITRATE (PF) 100 MCG/2ML IJ SOLN: 25.0000 ug | INTRAMUSCULAR | Status: DC | PRN

## 2018-10-12 MED ORDER — BUPIVACAINE IN DEXTROSE 0.75-8.25 % IT SOLN
INTRATHECAL | Status: DC | PRN
  Administered 2018-10-12: 1.8 mL via INTRATHECAL

## 2018-10-12 MED ORDER — CHLORHEXIDINE GLUCONATE 4 % EX LIQD: 60.0000 mL | Freq: Once | CUTANEOUS | Status: DC

## 2018-10-12 MED ORDER — OXYCODONE HCL 5 MG PO TABS
5.0000 mg | ORAL_TABLET | Freq: Once | ORAL | Status: AC | PRN
  Administered 2018-10-12: 5 mg via ORAL

## 2018-10-12 MED ORDER — FLUTICASONE PROPIONATE 50 MCG/ACT NA SUSP
2.0000 | NASAL | Status: DC
  Filled 2018-10-12: qty 16

## 2018-10-12 MED ORDER — PANTOPRAZOLE SODIUM 40 MG PO TBEC
40.0000 mg | DELAYED_RELEASE_TABLET | Freq: Every day | ORAL | Status: DC
  Administered 2018-10-13: 40 mg via ORAL
  Filled 2018-10-12: qty 1

## 2018-10-12 MED ORDER — MENTHOL 3 MG MT LOZG: 1.0000 | LOZENGE | OROMUCOSAL | Status: DC | PRN

## 2018-10-12 MED ORDER — ACETAMINOPHEN 325 MG PO TABS: 325.0000 mg | ORAL_TABLET | Freq: Four times a day (QID) | ORAL | Status: DC | PRN

## 2018-10-12 MED ORDER — ONDANSETRON HCL 4 MG/2ML IJ SOLN: 4.0000 mg | Freq: Four times a day (QID) | INTRAMUSCULAR | Status: DC | PRN

## 2018-10-12 MED ORDER — ATORVASTATIN CALCIUM 10 MG PO TABS
10.0000 mg | ORAL_TABLET | Freq: Every day | ORAL | Status: DC
  Administered 2018-10-12: 10 mg via ORAL
  Filled 2018-10-12: qty 1

## 2018-10-12 MED ORDER — PROPOFOL 1000 MG/100ML IV EMUL
INTRAVENOUS | Status: AC
  Filled 2018-10-12: qty 100

## 2018-10-12 MED ORDER — FENTANYL CITRATE (PF) 250 MCG/5ML IJ SOLN
INTRAMUSCULAR | Status: AC
  Filled 2018-10-12: qty 5

## 2018-10-12 SURGICAL SUPPLY — 60 items
BAG DECANTER FOR FLEXI CONT (MISCELLANEOUS) ×2 IMPLANT
BANDAGE ESMARK 6X9 LF (GAUZE/BANDAGES/DRESSINGS) ×1 IMPLANT
BLADE SAGITTAL 25.0X1.19X90 (BLADE) ×2 IMPLANT
BLADE SAGITTAL 25.0X1.19X90MM (BLADE) ×1
BLADE SAW SGTL 13.0X1.19X90.0M (BLADE) IMPLANT
BNDG CMPR 9X6 STRL LF SNTH (GAUZE/BANDAGES/DRESSINGS) ×1
BNDG CMPR MED 10X6 ELC LF (GAUZE/BANDAGES/DRESSINGS) ×1
BNDG ELASTIC 6X10 VLCR STRL LF (GAUZE/BANDAGES/DRESSINGS) ×3 IMPLANT
BNDG ESMARK 6X9 LF (GAUZE/BANDAGES/DRESSINGS) ×3
BOWL SMART MIX CTS (DISPOSABLE) ×3 IMPLANT
CEMENT HV SMART SET (Cement) ×6 IMPLANT
CEMENT TIBIA MBT (Knees) IMPLANT
COMP FEM CEM STD RT LCS (Orthopedic Implant) ×3 IMPLANT
COMPONENT FEM CEM STD RT LCS (Orthopedic Implant) IMPLANT
COVER SURGICAL LIGHT HANDLE (MISCELLANEOUS) ×3 IMPLANT
COVER WAND RF STERILE (DRAPES) ×3 IMPLANT
CUFF TOURNIQUET SINGLE 34IN LL (TOURNIQUET CUFF) ×3 IMPLANT
CUFF TOURNIQUET SINGLE 44IN (TOURNIQUET CUFF) IMPLANT
DECANTER SPIKE VIAL GLASS SM (MISCELLANEOUS) ×3 IMPLANT
DRAPE EXTREMITY T 121X128X90 (DRAPE) ×3 IMPLANT
DRAPE HALF SHEET 40X57 (DRAPES) ×6 IMPLANT
DRAPE U-SHAPE 47X51 STRL (DRAPES) ×3 IMPLANT
DRSG AQUACEL AG ADV 3.5X10 (GAUZE/BANDAGES/DRESSINGS) ×3 IMPLANT
DURAPREP 26ML APPLICATOR (WOUND CARE) ×3 IMPLANT
ELECT REM PT RETURN 9FT ADLT (ELECTROSURGICAL) ×3
ELECTRODE REM PT RTRN 9FT ADLT (ELECTROSURGICAL) ×1 IMPLANT
GLOVE BIO SURGEON STRL SZ8 (GLOVE) ×6 IMPLANT
GLOVE BIOGEL PI IND STRL 8 (GLOVE) ×2 IMPLANT
GLOVE BIOGEL PI INDICATOR 8 (GLOVE) ×4
GOWN STRL REUS W/ TWL LRG LVL3 (GOWN DISPOSABLE) ×1 IMPLANT
GOWN STRL REUS W/ TWL XL LVL3 (GOWN DISPOSABLE) ×2 IMPLANT
GOWN STRL REUS W/TWL LRG LVL3 (GOWN DISPOSABLE) ×3
GOWN STRL REUS W/TWL XL LVL3 (GOWN DISPOSABLE) ×6
HANDPIECE INTERPULSE COAX TIP (DISPOSABLE) ×3
HOOD PEEL AWAY FACE SHEILD DIS (HOOD) ×6 IMPLANT
INSERT TIB LCS RP STD 12.5 (Knees) ×3 IMPLANT
KIT BASIN OR (CUSTOM PROCEDURE TRAY) ×3 IMPLANT
KIT TURNOVER KIT B (KITS) ×3 IMPLANT
MANIFOLD NEPTUNE II (INSTRUMENTS) ×3 IMPLANT
NDL HYPO 21X1 ECLIPSE (NEEDLE) ×1 IMPLANT
NEEDLE HYPO 21X1 ECLIPSE (NEEDLE) ×3 IMPLANT
NS IRRIG 1000ML POUR BTL (IV SOLUTION) ×3 IMPLANT
PACK TOTAL JOINT (CUSTOM PROCEDURE TRAY) ×3 IMPLANT
PAD ARMBOARD 7.5X6 YLW CONV (MISCELLANEOUS) ×6 IMPLANT
PATELLA DOME PFC 32MM (Knees) ×2 IMPLANT
PIN STEINMAN FIXATION KNEE (PIN) ×2 IMPLANT
SET HNDPC FAN SPRY TIP SCT (DISPOSABLE) ×1 IMPLANT
SUT VIC AB 0 CT1 27 (SUTURE) ×3
SUT VIC AB 0 CT1 27XBRD ANBCTR (SUTURE) ×1 IMPLANT
SUT VIC AB 2-0 CT1 27 (SUTURE) ×3
SUT VIC AB 2-0 CT1 TAPERPNT 27 (SUTURE) ×1 IMPLANT
SUT VIC AB 3-0 FS2 27 (SUTURE) ×3 IMPLANT
SUT VLOC 180 0 24IN GS25 (SUTURE) ×3 IMPLANT
SYR 50ML LL SCALE MARK (SYRINGE) ×3 IMPLANT
TIBIA MBT CEMENT (Knees) ×3 IMPLANT
TOWEL OR 17X24 6PK STRL BLUE (TOWEL DISPOSABLE) ×3 IMPLANT
TOWEL OR 17X26 10 PK STRL BLUE (TOWEL DISPOSABLE) ×3 IMPLANT
TRAY CATH 16FR W/PLASTIC CATH (SET/KITS/TRAYS/PACK) IMPLANT
TRAY FOLEY W/BAG SLVR 16FR (SET/KITS/TRAYS/PACK) ×3
TRAY FOLEY W/BAG SLVR 16FR ST (SET/KITS/TRAYS/PACK) IMPLANT

## 2018-10-12 NOTE — Interval H&P Note (Signed)
History and Physical Interval Note:  10/12/2018 9:20 AM  Pamela Hendricks  has presented today for surgery, with the diagnosis of RIGHT KNEE DEGENERATIVE JOINT DISEASE  The various methods of treatment have been discussed with the patient and family. After consideration of risks, benefits and other options for treatment, the patient has consented to  Procedure(s): TOTAL KNEE ARTHROPLASTY (Right) as a surgical intervention .  The patient's history has been reviewed, patient examined, no change in status, stable for surgery.  I have reviewed the patient's chart and labs.  Questions were answered to the patient's satisfaction.     Elynor Kallenberger G

## 2018-10-12 NOTE — Progress Notes (Signed)
Patient states that she has a history of OSA and CSA. NIV set up for patient with a medium mask. Settings are adjusted per patient home regimen. Pressure 5cmh2o. Humidity provided. Education given to patient on the proper use of the machine. Patient verbalize understanding. Patient states she will wear the mask once RN gives her meds. Informed patient to please let me know if she needs assistance with the NIV machine.

## 2018-10-12 NOTE — Evaluation (Signed)
Physical Therapy Evaluation Patient Details Name: Pamela Hendricks DOB: 02/25/1946 Today's Date: 10/12/2018   History of Present Illness  Pt is a 72 y/o female s/p elective R TKA. PMH includes OSA on CPAP, PVD, HTN, CAD, and L TKA.   Clinical Impression  Pt is s/p surgery above with deficits below. Pt requiring min guard A for ambulation with RW, however, required mod A to stand using RW. Tolerated gait training well this session. Reviewed knee precautions and supine HEP. Pt unsure of who will stay with her at d/c, however, states "she is working on that." Will continue to follow acutely to maximize functional mobility independence and safety.     Follow Up Recommendations Follow surgeon's recommendation for DC plan and follow-up therapies;Supervision for mobility/OOB    Equipment Recommendations  Rolling walker with 5" wheels    Recommendations for Other Services       Precautions / Restrictions Precautions Precautions: Knee Precaution Booklet Issued: Yes (comment) Precaution Comments: Reviewed knee precautions with pt.  Restrictions Weight Bearing Restrictions: Yes RLE Weight Bearing: Weight bearing as tolerated      Mobility  Bed Mobility Overal bed mobility: Needs Assistance Bed Mobility: Supine to Sit     Supine to sit: Supervision     General bed mobility comments: Supervision for line management   Transfers Overall transfer level: Needs assistance Equipment used: Rolling walker (2 wheeled) Transfers: Sit to/from Stand Sit to Stand: Mod assist         General transfer comment: Mod A for lift assist and steadying assist. Cues for safe hand placement.   Ambulation/Gait Ambulation/Gait assistance: Min guard Gait Distance (Feet): 75 Feet Assistive device: Rolling walker (2 wheeled) Gait Pattern/deviations: Step-to pattern;Decreased step length - right;Decreased step length - left;Decreased weight shift to right;Antalgic Gait velocity:  Decreased    General Gait Details: Slow, antalgic gait. Cues for sequencing using RW and for proximity to device.   Stairs            Wheelchair Mobility    Modified Rankin (Stroke Patients Only)       Balance Overall balance assessment: Needs assistance Sitting-balance support: No upper extremity supported;Feet supported Sitting balance-Leahy Scale: Fair     Standing balance support: Bilateral upper extremity supported;During functional activity Standing balance-Leahy Scale: Poor Standing balance comment: Reliant on BUE support                              Pertinent Vitals/Pain Pain Assessment: 0-10 Pain Score: 3  Pain Location: R knee  Pain Descriptors / Indicators: Aching;Operative site guarding Pain Intervention(s): Limited activity within patient's tolerance;Monitored during session;Repositioned    Home Living Family/patient expects to be discharged to:: Private residence Living Arrangements: Alone Available Help at Discharge: (Reports she is "working on it" when asked who would stay) Type of Home: House Home Access: Stairs to enter Entrance Stairs-Rails: None Entrance Stairs-Number of Steps: 3 Home Layout: One level Home Equipment: Cane - single point;Bedside commode      Prior Function Level of Independence: Independent               Hand Dominance        Extremity/Trunk Assessment   Upper Extremity Assessment Upper Extremity Assessment: Overall WFL for tasks assessed    Lower Extremity Assessment Lower Extremity Assessment: RLE deficits/detail RLE Deficits / Details: Deficits consistent with post op pain and weakness.     Cervical / Trunk Assessment  Cervical / Trunk Assessment: Normal  Communication   Communication: No difficulties  Cognition Arousal/Alertness: Awake/alert Behavior During Therapy: WFL for tasks assessed/performed Overall Cognitive Status: Within Functional Limits for tasks assessed                                         General Comments General comments (skin integrity, edema, etc.): Pt's friend present throughout session.     Exercises Total Joint Exercises Ankle Circles/Pumps: AROM;Both;20 reps Quad Sets: AROM;Right;10 reps   Assessment/Plan    PT Assessment Patient needs continued PT services  PT Problem List Decreased strength;Decreased balance;Decreased range of motion;Decreased mobility;Decreased knowledge of use of DME;Decreased knowledge of precautions;Pain       PT Treatment Interventions DME instruction;Gait training;Stair training;Functional mobility training;Therapeutic activities;Therapeutic exercise;Balance training;Patient/family education    PT Goals (Current goals can be found in the Care Plan section)  Acute Rehab PT Goals Patient Stated Goal: to go home  PT Goal Formulation: With patient Time For Goal Achievement: 10/26/18 Potential to Achieve Goals: Good    Frequency 7X/week   Barriers to discharge Decreased caregiver support      Co-evaluation               AM-PAC PT "6 Clicks" Mobility  Outcome Measure Help needed turning from your back to your side while in a flat bed without using bedrails?: None Help needed moving from lying on your back to sitting on the side of a flat bed without using bedrails?: A Little Help needed moving to and from a bed to a chair (including a wheelchair)?: A Little Help needed standing up from a chair using your arms (e.g., wheelchair or bedside chair)?: A Lot Help needed to walk in hospital room?: A Little Help needed climbing 3-5 steps with a railing? : A Little 6 Click Score: 18    End of Session Equipment Utilized During Treatment: Gait belt Activity Tolerance: Patient tolerated treatment well Patient left: in chair;with call bell/phone within reach;with family/visitor present Nurse Communication: Mobility status;Patient requests pain meds PT Visit Diagnosis: Other abnormalities of gait  and mobility (R26.89);Pain Pain - Right/Left: Right Pain - part of body: Knee    Time: 2229-7989 PT Time Calculation (min) (ACUTE ONLY): 23 min   Charges:   PT Evaluation $PT Eval Low Complexity: 1 Low PT Treatments $Gait Training: 8-22 mins        Pamela Hendricks, PT, DPT  Acute Rehabilitation Services  Pager: [phone] Office: [phone]   Rudean Hitt 10/12/2018, 6:16 PM

## 2018-10-12 NOTE — Anesthesia Procedure Notes (Signed)
Anesthesia Regional Block: Adductor canal block   Pre-Anesthetic Checklist: ,, timeout performed, Correct Patient, Correct Site, Correct Laterality, Correct Procedure, Correct Position, site marked, Risks and benefits discussed,  Surgical consent,  Pre-op evaluation,  At surgeon's request and post-op pain management  Laterality: Right  Prep: chloraprep       Needles:  Injection technique: Single-shot  Needle Type: Echogenic Stimulator Needle     Needle Length: 9cm  Needle Gauge: 21     Additional Needles:   Procedures:,,,, ultrasound used (permanent image in chart),,,,  Narrative:  Start time: 10/12/2018 9:32 AM End time: 10/12/2018 9:38 AM Injection made incrementally with aspirations every 5 mL.  Performed by: Personally  Anesthesiologist: Lidia Collum, MD  Additional Notes: Monitors applied. Injection made in 5cc increments. No resistance to injection. Good needle visualization. Patient tolerated procedure well.

## 2018-10-12 NOTE — Anesthesia Postprocedure Evaluation (Signed)
Anesthesia Post Note  Patient: Pamela Hendricks  Procedure(s) Performed: TOTAL KNEE ARTHROPLASTY (Right Knee)     Patient location during evaluation: PACU Anesthesia Type: Spinal Level of consciousness: oriented and awake and alert Pain management: pain level controlled Vital Signs Assessment: post-procedure vital signs reviewed and stable Respiratory status: spontaneous breathing, respiratory function stable and nonlabored ventilation Cardiovascular status: blood pressure returned to baseline and stable Postop Assessment: no headache, no backache, no apparent nausea or vomiting and spinal receding Anesthetic complications: no    Last Vitals:  Vitals:   10/12/18 1415 10/12/18 1515  BP: (!) 117/51 (!) 97/45  Pulse: 65 66  Resp: 16 18  Temp:    SpO2: 100% 100%    Last Pain:  Vitals:   10/12/18 1515  TempSrc:   PainSc: 0-No pain                 Lidia Collum

## 2018-10-12 NOTE — Op Note (Signed)
PREOP DIAGNOSIS: DJD RIGHT KNEE POSTOP DIAGNOSIS: same PROCEDURE: RIGHT TKR ANESTHESIA: Spinal and MAC ATTENDING SURGEON: Elesia Pemberton G ASSISTANT: Loni Dolly PA  INDICATIONS FOR PROCEDURE: Pamela Hendricks is a 72 y.o. female who has struggled for a long time with pain due to degenerative arthritis of the right knee.  The patient has failed many conservative non-operative measures and at this point has pain which limits the ability to sleep and walk.  The patient is offered total knee replacement.  Informed operative consent was obtained after discussion of possible risks of anesthesia, infection, neurovascular injury, DVT, and death.  The importance of the post-operative rehabilitation protocol to optimize result was stressed extensively with the patient. She is 15 years from an opposite side TKR.  SUMMARY OF FINDINGS AND PROCEDURE:  MARRAH Hendricks was taken to the operative suite where under the above anesthesia a right knee replacement was performed.  There were advanced degenerative changes and the bone quality was good.  We used the DePuy LCS system and placed size standard femur, 3 tibia, 32 mm all polyethylene patella, and a size 12.5 mm spacer.  I did my best to correct her moderate valgus deformity.  Loni Dolly PA-C assisted throughout and was invaluable to the completion of the case in that he helped retract and maintain exposure while I placed components.  He also helped close thereby minimizing OR time.  The patient was admitted for appropriate post-op care to include perioperative antibiotics and mechanical and pharmacologic measures for DVT prophylaxis.  DESCRIPTION OF PROCEDURE:  Pamela Hendricks was taken to the operative suite where the above anesthesia was applied.  The patient was positioned supine and prepped and draped in normal sterile fashion.  An appropriate time out was performed.  After the administration of kefzol pre-op antibiotic the leg was elevated and exsanguinated  and a tourniquet inflated. A standard longitudinal incision was made on the anterior knee.  Dissection was carried down to the extensor mechanism.  All appropriate anti-infective measures were used including the pre-operative antibiotic, betadine impregnated drape, and closed hooded exhaust systems for each member of the surgical team.  A medial parapatellar incision was made in the extensor mechanism and the knee cap flipped and the knee flexed.  Some residual meniscal tissues were removed along with any remaining ACL/PCL tissue.  A guide was placed on the tibia and a flat cut was made on it's superior surface.  An intramedullary guide was placed in the femur and was utilized to make anterior and posterior cuts creating an appropriate flexion gap.  A second intramedullary guide was placed in the femur to make a distal cut properly balancing the knee with an extension gap equal to the flexion gap.  The three bones sized to the above mentioned sizes and the appropriate guides were placed and utilized.  A trial reduction was done and the knee easily came to full extension and the patella tracked well on flexion.  The trial components were removed and all bones were cleaned with pulsatile lavage and then dried thoroughly.  Cement was mixed and was pressurized onto the bones followed by placement of the aforementioned components.  Excess cement was trimmed and pressure was held on the components until the cement had hardened.  The tourniquet was deflated and a small amount of bleeding was controlled with cautery and pressure.  The knee was irrigated thoroughly.  The extensor mechanism was re-approximated with V-loc suture in running fashion.  The knee was flexed and the  repair was solid.  The subcutaneous tissues were re-approximated with #0 and #2-0 vicryl and the skin closed with a subcuticular stitch and steristrips.  A sterile dressing was applied.  Intraoperative fluids, EBL, and tourniquet time can be obtained  from anesthesia records.  DISPOSITION:  The patient was taken to recovery room in stable condition and admitted for appropriate post-op care to include peri-operative antibiotic and DVT prophylaxis with mechanical and pharmacologic measures.  Pamela Hendricks G 10/12/2018, 12:14 PM

## 2018-10-12 NOTE — Transfer of Care (Signed)
Immediate Anesthesia Transfer of Care Note  Patient: Pamela Hendricks  Procedure(s) Performed: TOTAL KNEE ARTHROPLASTY (Right Knee)  Patient Location: PACU  Anesthesia Type:Spinal and MAC combined with regional for post-op pain  Level of Consciousness: awake and patient cooperative  Airway & Oxygen Therapy: Patient Spontanous Breathing and Patient connected to nasal cannula oxygen  Post-op Assessment: Report given to RN and Post -op Vital signs reviewed and stable  Post vital signs: Reviewed and stable  Last Vitals:  Vitals Value Taken Time  BP    Temp    Pulse    Resp    SpO2      Last Pain:  Vitals:   10/12/18 0816  TempSrc:   PainSc: 0-No pain         Complications: No apparent anesthesia complications

## 2018-10-12 NOTE — Anesthesia Procedure Notes (Signed)
Spinal  Patient location during procedure: OR Staffing Anesthesiologist: Witman, Carolyn E, MD Performed: anesthesiologist  Preanesthetic Checklist Completed: patient identified, surgical consent, pre-op evaluation, timeout performed, IV checked, risks and benefits discussed and monitors and equipment checked Spinal Block Patient position: sitting Prep: site prepped and draped and DuraPrep Patient monitoring: continuous pulse ox, blood pressure and heart rate Approach: midline Location: L3-4 Injection technique: single-shot Needle Needle type: Pencan  Needle gauge: 24 G Needle length: 9 cm Additional Notes Functioning IV was confirmed and monitors were applied. Sterile prep and drape, including hand hygiene and sterile gloves were used. The patient was positioned and the spine was prepped. The skin was anesthetized with lidocaine.  Free flow of clear CSF was obtained prior to injecting local anesthetic into the CSF. The needle was carefully withdrawn. The patient tolerated the procedure well.      

## 2018-10-12 NOTE — Anesthesia Preprocedure Evaluation (Addendum)
Anesthesia Evaluation  Patient identified by MRN, date of birth, ID band Patient awake    Reviewed: Allergy & Precautions, NPO status , Patient's Chart, lab work & pertinent test results  History of Anesthesia Complications Negative for: history of anesthetic complications  Airway Mallampati: II  TM Distance: >3 FB Neck ROM: Full    Dental no notable dental hx.    Pulmonary sleep apnea and Continuous Positive Airway Pressure Ventilation ,    Pulmonary exam normal        Cardiovascular hypertension, Normal cardiovascular exam     Neuro/Psych Mild carotid stenosis negative psych ROS   GI/Hepatic Neg liver ROS, GERD  ,  Endo/Other  negative endocrine ROS  Renal/GU negative Renal ROS  negative genitourinary   Musculoskeletal  (+) Arthritis , Osteoarthritis,    Abdominal (+) + obese,   Peds  Hematology negative hematology ROS (+)   Anesthesia Other Findings 72 yo F for R TKA - HTN/HLD, mild B/L carotid artery stenosis, OSA on CPAP, GERD - Hgb 12.1, plts 256, INR 1.06 - [facility] 07/15/17: Low risk stress nuclear study with mild soft tissue attenuation; no ischemia; EF 75 with normal wall motion - EKG 08/16/18: NSR  Reproductive/Obstetrics                            Anesthesia Physical Anesthesia Plan  ASA: III  Anesthesia Plan: Spinal   Post-op Pain Management:  Regional for Post-op pain   Induction:   PONV Risk Score and Plan: 2 and Propofol infusion, TIVA and Treatment may vary due to age or medical condition  Airway Management Planned: Nasal Cannula and Simple Face Mask  Additional Equipment: None  Intra-op Plan:   Post-operative Plan:   Informed Consent: I have reviewed the patients History and Physical, chart, labs and discussed the procedure including the risks, benefits and alternatives for the proposed anesthesia with the patient or authorized representative who has indicated  his/her understanding and acceptance.     Plan Discussed with:   Anesthesia Plan Comments:        Anesthesia Quick Evaluation

## 2018-10-13 ENCOUNTER — Encounter (HOSPITAL_COMMUNITY): Payer: Self-pay | Admitting: Orthopaedic Surgery

## 2018-10-13 ENCOUNTER — Other Ambulatory Visit: Payer: Self-pay

## 2018-10-13 DIAGNOSIS — M1711 Unilateral primary osteoarthritis, right knee: Secondary | ICD-10-CM | POA: Diagnosis not present

## 2018-10-13 MED ORDER — ASPIRIN 325 MG PO TBEC: 325.0000 mg | DELAYED_RELEASE_TABLET | Freq: Two times a day (BID) | ORAL | 0 refills | Status: DC

## 2018-10-13 MED ORDER — HYDROCODONE-ACETAMINOPHEN 5-325 MG PO TABS: 1.0000 | ORAL_TABLET | Freq: Four times a day (QID) | ORAL | 0 refills | Status: DC | PRN

## 2018-10-13 MED ORDER — DOCUSATE SODIUM 100 MG PO CAPS: 100.0000 mg | ORAL_CAPSULE | Freq: Two times a day (BID) | ORAL | 0 refills | Status: DC

## 2018-10-13 MED ORDER — BISACODYL 5 MG PO TBEC: 5.0000 mg | DELAYED_RELEASE_TABLET | Freq: Every day | ORAL | 0 refills | Status: DC | PRN

## 2018-10-13 MED ORDER — TIZANIDINE HCL 4 MG PO TABS: 4.0000 mg | ORAL_TABLET | Freq: Four times a day (QID) | ORAL | 1 refills | Status: DC | PRN

## 2018-10-13 NOTE — Plan of Care (Signed)
  Problem: Coping: Goal: Level of anxiety will decrease Outcome: Progressing   Problem: Pain Managment: Goal: General experience of comfort will improve Outcome: Progressing   

## 2018-10-13 NOTE — Care Management Note (Signed)
Case Management Note  Patient Details  Name: Pamela Hendricks MRN: [id number] Date of Birth: Mar 28, 1946  Subjective/Objective:  72 yr old female s/p elective right total knee arthroplasty.                  Action/Plan: Case manager spoke with patient concerning discharge plan and DME. Patient was preoperatively setup with [facility] at home, no changes. DME has been delivered to patient's room. Patient has great church support system nad looking forward to returning to her activities.   Expected Discharge Date:  10/13/18               Expected Discharge Plan:  [place]  In-House Referral:  NA  Discharge planning Services  CM Consult  Post Acute Care Choice:  Durable Medical Equipment, Home Health Choice offered to:  Patient  DME Arranged:  3-N-1, Walker rolling DME Agency:  [employer]  HH Arranged:  PT [place] Agency:  [facility] [facility] [employer]  Status of Service:  Completed, signed off  If discussed at [employer], dates discussed:    Additional Comments:  Ninfa Meeker, RN 10/13/2018, 10:28 AM

## 2018-10-13 NOTE — Progress Notes (Signed)
Physical Therapy Treatment Patient Details Name: Pamela Hendricks MRN: [id number] DOB: [birth year] Today's Date: 10/13/2018    History of Present Illness Pt is a 72 y/o female s/p elective R TKA. PMH includes OSA on CPAP, PVD, HTN, CAD, and L TKA.     PT Comments    Patient seen for mobility progression. Pt is progressing well toward PT goals and tolerated stair and gait training well. Pt became nauseous end of session and requests nausea medication. RN notified. Patient requires Pt reports that she is "working on" getting some one to stay with her for a few days upon d/c because pt lives alone. Next session will focus on HEP and gait training.    Follow Up Recommendations  Follow surgeon's recommendation for DC plan and follow-up therapies;Supervision for mobility/OOB     Equipment Recommendations  Rolling walker with 5" wheels    Recommendations for Other Services       Precautions / Restrictions Precautions Precautions: Knee Precaution Booklet Issued: Yes (comment) Precaution Comments: Reviewed knee precautions/positioning with pt Restrictions Weight Bearing Restrictions: Yes RLE Weight Bearing: Weight bearing as tolerated    Mobility  Bed Mobility Overal bed mobility: Modified Independent Bed Mobility: Supine to Sit           General bed mobility comments: increased time and effort   Transfers Overall transfer level: Needs assistance Equipment used: Rolling walker (2 wheeled) Transfers: Sit to/from Stand Sit to Stand: Supervision         General transfer comment: supervision for safety from EOB and BSC; cues for safe hand placement  Ambulation/Gait Ambulation/Gait assistance: Min guard;Supervision Gait Distance (Feet): 200 Feet Assistive device: Rolling walker (2 wheeled) Gait Pattern/deviations: Decreased weight shift to right;Antalgic;Step-through pattern;Decreased stride length;Decreased step length - left Gait velocity: Decreased    General Gait  Details: cues for posture and step length symmetry; improved R LE weight bearing and stride length with distance    Stairs Stairs: Yes Stairs assistance: Min assist Stair Management: No rails;Step to pattern;Backwards;With walker Number of Stairs: (2 steps X 2 trials) General stair comments: cues for sequencing and technique; stair handout provided   Wheelchair Mobility    Modified Rankin (Stroke Patients Only)       Balance Overall balance assessment: Needs assistance Sitting-balance support: No upper extremity supported;Feet supported Sitting balance-Leahy Scale: Good     Standing balance support: During functional activity;Single extremity supported Standing balance-Leahy Scale: Poor                              Cognition Arousal/Alertness: Awake/alert Behavior During Therapy: WFL for tasks assessed/performed Overall Cognitive Status: Within Functional Limits for tasks assessed                                        Exercises      General Comments General comments (skin integrity, edema, etc.): pt became nauseous end of session and RN notified for nausea medication per pt request      Pertinent Vitals/Pain Pain Assessment: Faces Faces Pain Scale: Hurts little more Pain Location: R knee  Pain Descriptors / Indicators: Sore Pain Intervention(s): Limited activity within patient's tolerance;Monitored during session;Premedicated before session;Repositioned    Home Living                      Prior Function  PT Goals (current goals can now be found in the care plan section) Acute Rehab PT Goals Patient Stated Goal: to go home  Progress towards PT goals: Progressing toward goals    Frequency    7X/week      PT Plan Current plan remains appropriate    Co-evaluation              AM-PAC PT "6 Clicks" Mobility   Outcome Measure  Help needed turning from your back to your side while in a flat bed  without using bedrails?: None Help needed moving from lying on your back to sitting on the side of a flat bed without using bedrails?: None Help needed moving to and from a bed to a chair (including a wheelchair)?: A Little Help needed standing up from a chair using your arms (e.g., wheelchair or bedside chair)?: A Little Help needed to walk in hospital room?: A Little Help needed climbing 3-5 steps with a railing? : A Little 6 Click Score: 20    End of Session Equipment Utilized During Treatment: Gait belt Activity Tolerance: Patient tolerated treatment well Patient left: in chair;with call bell/phone within reach Nurse Communication: Mobility status PT Visit Diagnosis: Other abnormalities of gait and mobility (R26.89);Pain Pain - Right/Left: Right Pain - part of body: Knee     Time: 1287-8676 PT Time Calculation (min) (ACUTE ONLY): 41 min  Charges:  $Gait Training: 23-37 mins $Therapeutic Activity: 8-22 mins                     Earney Navy, PTA Acute Rehabilitation Services Pager: [phone] Office: [phone]     Darliss Cheney 10/13/2018, 9:43 AM

## 2018-10-13 NOTE — Progress Notes (Signed)
Physical Therapy Treatment Patient Details Name: Pamela Hendricks MRN: [id number] DOB: [birth year] Today's Date: 10/13/2018    History of Present Illness Pt is a 72 y/o female s/p elective R TKA. PMH includes OSA on CPAP, PVD, HTN, CAD, and L TKA.     PT Comments    Patient continues to make progress toward PT goals. Pt tolerated gait training well and able to complete most of HEP. HEP handout/frequency, ambulation schedule, and positioning reviewed with pt. Current plan remains appropriate.    Follow Up Recommendations  Follow surgeon's recommendation for DC plan and follow-up therapies;Supervision for mobility/OOB     Equipment Recommendations  Rolling walker with 5" wheels    Recommendations for Other Services       Precautions / Restrictions Precautions Precautions: Knee Precaution Comments: Reviewed knee precautions/positioning with pt Restrictions Weight Bearing Restrictions: Yes RLE Weight Bearing: Weight bearing as tolerated    Mobility  Bed Mobility Overal bed mobility: Modified Independent Bed Mobility: Sit to Supine           General bed mobility comments: increased time and effort   Transfers Overall transfer level: Needs assistance Equipment used: Rolling walker (2 wheeled) Transfers: Sit to/from Stand Sit to Stand: Supervision         General transfer comment: supervision for safety   Ambulation/Gait Ambulation/Gait assistance: Supervision Gait Distance (Feet): 200 Feet Assistive device: Rolling walker (2 wheeled) Gait Pattern/deviations: Decreased weight shift to right;Antalgic;Step-through pattern;Decreased stride length;Decreased step length - left Gait velocity: Decreased    General Gait Details: cues for posture and step length symmetry   Stairs             Wheelchair Mobility    Modified Rankin (Stroke Patients Only)       Balance Overall balance assessment: Needs assistance Sitting-balance support: No upper extremity  supported;Feet supported Sitting balance-Leahy Scale: Good     Standing balance support: During functional activity;Single extremity supported Standing balance-Leahy Scale: Poor                              Cognition Arousal/Alertness: Awake/alert Behavior During Therapy: WFL for tasks assessed/performed Overall Cognitive Status: Within Functional Limits for tasks assessed                                        Exercises Total Joint Exercises Ankle Circles/Pumps: AROM;Both Quad Sets: AROM;Both;Strengthening Short Arc Quad: AROM;Right;Strengthening Heel Slides: AAROM;Right;Strengthening Hip ABduction/ADduction: AROM;Right;Strengthening Straight Leg Raises: AROM;Right;Strengthening    General Comments        Pertinent Vitals/Pain Pain Assessment: 0-10 Pain Score: 4  Pain Location: R knee  Pain Descriptors / Indicators: Sore;Guarding Pain Intervention(s): Limited activity within patient's tolerance;Monitored during session;Premedicated before session;Repositioned    Home Living                      Prior Function            PT Goals (current goals can now be found in the care plan section) Acute Rehab PT Goals Patient Stated Goal: to go home  Progress towards PT goals: Progressing toward goals    Frequency    7X/week      PT Plan Current plan remains appropriate    Co-evaluation              AM-PAC PT "6 Clicks"  Mobility   Outcome Measure  Help needed turning from your back to your side while in a flat bed without using bedrails?: None Help needed moving from lying on your back to sitting on the side of a flat bed without using bedrails?: None Help needed moving to and from a bed to a chair (including a wheelchair)?: A Little Help needed standing up from a chair using your arms (e.g., wheelchair or bedside chair)?: A Little Help needed to walk in hospital room?: A Little Help needed climbing 3-5 steps with a  railing? : A Little 6 Click Score: 20    End of Session Equipment Utilized During Treatment: Gait belt Activity Tolerance: Patient tolerated treatment well Patient left: with call bell/phone within reach;in bed Nurse Communication: Mobility status PT Visit Diagnosis: Other abnormalities of gait and mobility (R26.89);Pain Pain - Right/Left: Right Pain - part of body: Knee     Time: 9150-5697 PT Time Calculation (min) (ACUTE ONLY): 39 min  Charges:  $Gait Training: 8-22 mins $Therapeutic Exercise: 23-37 mins                     Earney Navy, PTA Acute Rehabilitation Services Pager: [phone] Office: [phone]     Darliss Cheney 10/13/2018, 2:15 PM

## 2018-10-13 NOTE — Discharge Summary (Signed)
Patient ID: ALEXANDER MCAULEY MRN: [id number] DOB/AGE: [birth year] 72 y.o.  Admit date: 10/12/2018 Discharge date: 10/13/2018  Admission Diagnoses:  Principal Problem:   Primary osteoarthritis of right knee   Discharge Diagnoses:  Same  Past Medical History:  Diagnosis Date  . Arthritis   . Carotid artery disease ([place])    a. duplex 07/2017 - 1-39% stenosis bilaterally.  . Environmental and seasonal allergies   . GERD (gastroesophageal reflux disease)   . Hyperlipidemia   . Hypertension   . Migraines    sinus headaches, no longer having migraines  . OSA (obstructive sleep apnea)    CPAP  . Peripheral neuropathy   . Peripheral vascular disease ([place])    carotid blockage - is followed by Dr. Marlou Porch  . Snores   . Wears glasses     Surgeries: Procedure(s): TOTAL KNEE ARTHROPLASTY on 10/12/2018   Consultants:   Discharged Condition: Improved  Hospital Course: PAISLI SILFIES is an 72 y.o. female who was admitted 10/12/2018 for operative treatment ofPrimary osteoarthritis of right knee. Patient has severe unremitting pain that affects sleep, daily activities, and work/hobbies. After pre-op clearance the patient was taken to the operating room on 10/12/2018 and underwent  Procedure(s): TOTAL KNEE ARTHROPLASTY.    Patient was given perioperative antibiotics:  Anti-infectives (From admission, onward)   Start     Dose/Rate Route Frequency Ordered Stop   10/12/18 1645  ceFAZolin (ANCEF) IVPB 2g/100 mL premix     2 g 200 mL/hr over 30 Minutes Intravenous Every 6 hours 10/12/18 1536 10/13/18 0445   10/12/18 0800  ceFAZolin (ANCEF) IVPB 2g/100 mL premix     2 g 200 mL/hr over 30 Minutes Intravenous On call to O.R. 10/12/18 0752 10/12/18 1035   10/12/18 0756  ceFAZolin (ANCEF) 2-4 GM/100ML-% IVPB    Note to Pharmacy:  Starleen Arms   : cabinet override      10/12/18 0756 10/12/18 1035       Patient was given sequential compression devices, early ambulation, and chemoprophylaxis  to prevent DVT.  Patient benefited maximally from hospital stay and there were no complications.    Recent vital signs:  Patient Vitals for the past 24 hrs:  BP Temp Temp src Pulse Resp SpO2 Height Weight  10/13/18 0412 (!) 114/48 97.7 F (36.5 C) Oral 72 14 100 % - -  10/13/18 0012 (!) 116/56 (!) 97.4 F (36.3 C) Oral 76 11 - - -  10/12/18 2000 (!) 93/52 97.9 F (36.6 C) Oral 70 14 96 % - -  10/12/18 1515 (!) 97/45 - - 66 18 100 % - -  10/12/18 1415 (!) 117/51 - - 65 16 100 % - -  10/12/18 1315 (!) 114/55 - - 67 15 100 % - -  10/12/18 1301 (!) 103/55 - - 71 20 99 % - -  10/12/18 1257 (!) 106/59 98.1 F (36.7 C) - - - - - -  10/12/18 4782 - - - - - - 5\' 5"  (1.651 m) 94.5 kg  10/12/18 0751 (!) 164/82 98 F (36.7 C) Oral (!) 108 20 99 % 5\' 5"  (1.651 m) 96.2 kg     Recent laboratory studies: No results for input(s): WBC, HGB, HCT, PLT, NA, K, CL, CO2, BUN, CREATININE, GLUCOSE, INR, CALCIUM in the last 72 hours.  Invalid input(s): PT, 2   Discharge Medications:   Allergies as of 10/13/2018      Reactions   Other Other (See Comments)   Watery eyes , itching -  Grass, Rag Weed, [place], Smoke      Medication List    TAKE these medications   aspirin 325 MG EC tablet Take 1 tablet (325 mg total) by mouth 2 (two) times daily after a meal. What changed:    medication strength  how much to take  when to take this   atorvastatin 10 MG tablet Commonly known as:  LIPITOR Take 10 mg by mouth at bedtime.   benazepril 40 MG tablet Commonly known as:  LOTENSIN Take 40 mg by mouth daily.   bisacodyl 5 MG EC tablet Commonly known as:  DULCOLAX Take 1 tablet (5 mg total) by mouth daily as needed for moderate constipation.   CALCIUM 600+D3 PO Take 1 tablet by mouth 2 (two) times daily.   cetirizine 10 MG tablet Commonly known as:  ZYRTEC Take 10 mg by mouth daily.   docusate sodium 100 MG capsule Commonly known as:  COLACE Take 1 capsule (100 mg total) by mouth 2 (two)  times daily.   fluticasone 50 MCG/ACT nasal spray Commonly known as:  FLONASE Place 2 sprays into both nostrils every other day.   furosemide 40 MG tablet Commonly known as:  LASIX Take 20 mg by mouth daily.   HYDROcodone-acetaminophen 5-325 MG tablet Commonly known as:  NORCO/VICODIN Take 1-2 tablets by mouth every 6 (six) hours as needed for moderate pain or severe pain.   nortriptyline 50 MG capsule Commonly known as:  PAMELOR Take 50 mg by mouth at bedtime.   pantoprazole 40 MG tablet Commonly known as:  PROTONIX Take 40 mg by mouth daily.   propranolol ER 60 MG 24 hr capsule Commonly known as:  INDERAL LA Take 1 capsule (60 mg total) by mouth at bedtime. Please call [phone] to schedule appt.   tiZANidine 4 MG tablet Commonly known as:  ZANAFLEX Take 1 tablet (4 mg total) by mouth every 6 (six) hours as needed.            Durable Medical Equipment  (From admission, onward)         Start     Ordered   10/12/18 1537  DME Walker rolling  Once    Question:  Patient needs a walker to treat with the following condition  Answer:  Primary osteoarthritis of right knee   10/12/18 1536   10/12/18 1537  DME 3 n 1  Once     10/12/18 1536   10/12/18 1537  DME Bedside commode  Once    Question:  Patient needs a bedside commode to treat with the following condition  Answer:  Primary osteoarthritis of right knee   10/12/18 1536          Diagnostic Studies: Dg Chest 2 View  Result Date: 10/01/2018 CLINICAL DATA:  Preop for right knee arthroplasty. EXAM: CHEST - 2 VIEW COMPARISON:  Radiographs of September 01, 2014. FINDINGS: The heart size and mediastinal contours are within normal limits. Both lungs are clear. The visualized skeletal structures are unremarkable. IMPRESSION: No active cardiopulmonary disease. Electronically Signed   By: Marijo Conception, M.D.   On: 10/01/2018 16:36    Disposition: Discharge disposition: 01-Home or Self Care       Discharge  Instructions    Call MD / Call 911   Complete by:  As directed    If you experience chest pain or shortness of breath, CALL 911 and be transported to the hospital emergency room.  If you develope a fever above 101 F,  pus (white drainage) or increased drainage or redness at the wound, or calf pain, call your surgeon's office.   Constipation Prevention   Complete by:  As directed    Drink plenty of fluids.  Prune juice may be helpful.  You may use a stool softener, such as Colace (over the counter) 100 mg twice a day.  Use MiraLax (over the counter) for constipation as needed.   Diet - low sodium heart healthy   Complete by:  As directed    Discharge instructions   Complete by:  As directed    INSTRUCTIONS AFTER JOINT REPLACEMENT   Remove items at home which could result in a fall. This includes throw rugs or furniture in walking pathways ICE to the affected joint every three hours while awake for 30 minutes at a time, for at least the first 3-5 days, and then as needed for pain and swelling.  Continue to use ice for pain and swelling. You may notice swelling that will progress down to the foot and ankle.  This is normal after surgery.  Elevate your leg when you are not up walking on it.   Continue to use the breathing machine you got in the hospital (incentive spirometer) which will help keep your temperature down.  It is common for your temperature to cycle up and down following surgery, especially at night when you are not up moving around and exerting yourself.  The breathing machine keeps your lungs expanded and your temperature down.   DIET:  As you were doing prior to hospitalization, we recommend a well-balanced diet.  DRESSING / WOUND CARE / SHOWERING  You may shower 3 days after surgery, but keep the wounds dry during showering.  You may use an occlusive plastic wrap (Press'n Seal for example), NO SOAKING/SUBMERGING IN THE BATHTUB.  If the bandage gets wet, change with a clean dry  gauze.  If the incision gets wet, pat the wound dry with a clean towel.  ACTIVITY  Increase activity slowly as tolerated, but follow the weight bearing instructions below.   No driving for 6 weeks or until further direction given by your physician.  You cannot drive while taking narcotics.  No lifting or carrying greater than 10 lbs. until further directed by your surgeon. Avoid periods of inactivity such as sitting longer than an hour when not asleep. This helps prevent blood clots.  You may return to work once you are authorized by your doctor.     WEIGHT BEARING   Weight bearing as tolerated with assist device (walker, cane, etc) as directed, use it as long as suggested by your surgeon or therapist, typically at least 4-6 weeks.   EXERCISES  Results after joint replacement surgery are often greatly improved when you follow the exercise, range of motion and muscle strengthening exercises prescribed by your doctor. Safety measures are also important to protect the joint from further injury. Any time any of these exercises cause you to have increased pain or swelling, decrease what you are doing until you are comfortable again and then slowly increase them. If you have problems or questions, call your caregiver or physical therapist for advice.   Rehabilitation is important following a joint replacement. After just a few days of immobilization, the muscles of the leg can become weakened and shrink (atrophy).  These exercises are designed to build up the tone and strength of the thigh and leg muscles and to improve motion. Often times heat used for twenty to thirty  minutes before working out will loosen up your tissues and help with improving the range of motion but do not use heat for the first two weeks following surgery (sometimes heat can increase post-operative swelling).   These exercises can be done on a training (exercise) mat, on the floor, on a table or on a bed. Use whatever works  the best and is most comfortable for you.    Use music or television while you are exercising so that the exercises are a pleasant break in your day. This will make your life better with the exercises acting as a break in your routine that you can look forward to.   Perform all exercises about fifteen times, three times per day or as directed.  You should exercise both the operative leg and the other leg as well.   Exercises include:   Quad Sets - Tighten up the muscle on the front of the thigh (Quad) and hold for 5-10 seconds.   Straight Leg Raises - With your knee straight (if you were given a brace, keep it on), lift the leg to 60 degrees, hold for 3 seconds, and slowly lower the leg.  Perform this exercise against resistance later as your leg gets stronger.  Leg Slides: Lying on your back, slowly slide your foot toward your buttocks, bending your knee up off the floor (only go as far as is comfortable). Then slowly slide your foot back down until your leg is flat on the floor again.  Angel Wings: Lying on your back spread your legs to the side as far apart as you can without causing discomfort.  Hamstring Strength:  Lying on your back, push your heel against the floor with your leg straight by tightening up the muscles of your buttocks.  Repeat, but this time bend your knee to a comfortable angle, and push your heel against the floor.  You may put a pillow under the heel to make it more comfortable if necessary.   A rehabilitation program following joint replacement surgery can speed recovery and prevent re-injury in the future due to weakened muscles. Contact your doctor or a physical therapist for more information on knee rehabilitation.    CONSTIPATION  Constipation is defined medically as fewer than three stools per week and severe constipation as less than one stool per week.  Even if you have a regular bowel pattern at home, your normal regimen is likely to be disrupted due to multiple  reasons following surgery.  Combination of anesthesia, postoperative narcotics, change in appetite and fluid intake all can affect your bowels.   YOU MUST use at least one of the following options; they are listed in order of increasing strength to get the job done.  They are all available over the counter, and you may need to use some, POSSIBLY even all of these options:    Drink plenty of fluids (prune juice may be helpful) and high fiber foods Colace 100 mg by mouth twice a day  Senokot for constipation as directed and as needed Dulcolax (bisacodyl), take with full glass of water  Miralax (polyethylene glycol) once or twice a day as needed.  If you have tried all these things and are unable to have a bowel movement in the first 3-4 days after surgery call either your surgeon or your primary doctor.    If you experience loose stools or diarrhea, hold the medications until you stool forms back up.  If your symptoms do not get better  within 1 week or if they get worse, check with your doctor.  If you experience "the worst abdominal pain ever" or develop nausea or vomiting, please contact the office immediately for further recommendations for treatment.   ITCHING:  If you experience itching with your medications, try taking only a single pain pill, or even half a pain pill at a time.  You can also use Benadryl over the counter for itching or also to help with sleep.   TED HOSE STOCKINGS:  Use stockings on both legs until for at least 2 weeks or as directed by physician office. They may be removed at night for sleeping.  MEDICATIONS:  See your medication summary on the "After Visit Summary" that nursing will review with you.  You may have some home medications which will be placed on hold until you complete the course of blood thinner medication.  It is important for you to complete the blood thinner medication as prescribed.  PRECAUTIONS:  If you experience chest pain or shortness of breath - call  911 immediately for transfer to the hospital emergency department.   If you develop a fever greater that 101 F, purulent drainage from wound, increased redness or drainage from wound, foul odor from the wound/dressing, or calf pain - CONTACT YOUR SURGEON.                                                   FOLLOW-UP APPOINTMENTS:  If you do not already have a post-op appointment, please call the office for an appointment to be seen by your surgeon.  Guidelines for how soon to be seen are listed in your "After Visit Summary", but are typically between 1-4 weeks after surgery.  OTHER INSTRUCTIONS:   Knee Replacement:  Do not place pillow under knee, focus on keeping the knee straight while resting. CPM instructions: 0-90 degrees, 2 hours in the morning, 2 hours in the afternoon, and 2 hours in the evening. Place foam block, curve side up under heel at all times except when in CPM or when walking.  DO NOT modify, tear, cut, or change the foam block in any way.  MAKE SURE YOU:  Understand these instructions.  Get help right away if you are not doing well or get worse.    Thank you for letting us be a part of your medical care team.  It is a privilege we respect greatly.  We hope these instructions will help you stay on track for a fast and full recovery!   Increase activity slowly as tolerated   Complete by:  As directed       Follow-up Information    Melrose Nakayama, MD. Schedule an appointment as soon as possible for a visit in 2 weeks.   Specialty:  Orthopedic Surgery Contact information: [address] [place] [address] [phone]            Signed: Rich Fuchs 10/13/2018, 7:41 AM

## 2018-10-13 NOTE — Progress Notes (Signed)
Subjective: 1 Day Post-Op Procedure(s) (LRB): TOTAL KNEE ARTHROPLASTY (Right)   Patient feels great and would like to go home today.  Activity level:  wbat Diet tolerance:  ok Voiding:  Foley out this morning Patient reports pain as mild.    Objective: Vital signs in last 24 hours: Temp:  [97.4 F (36.3 C)-98.1 F (36.7 C)] 97.7 F (36.5 C) (12/04 0412) Pulse Rate:  [65-108] 72 (12/04 0412) Resp:  [11-20] 14 (12/04 0412) BP: (93-164)/(45-82) 114/48 (12/04 0412) SpO2:  [96 %-100 %] 100 % (12/04 0412) Weight:  [94.5 kg-96.2 kg] 94.5 kg (12/03 0752)  Labs: No results for input(s): HGB in the last 72 hours. No results for input(s): WBC, RBC, HCT, PLT in the last 72 hours. No results for input(s): NA, K, CL, CO2, BUN, CREATININE, GLUCOSE, CALCIUM in the last 72 hours. No results for input(s): LABPT, INR in the last 72 hours.  Physical Exam:  Neurologically intact ABD soft Neurovascular intact Sensation intact distally Intact pulses distally Dorsiflexion/Plantar flexion intact Incision: dressing C/D/I and no drainage No cellulitis present Compartment soft  Assessment/Plan:  1 Day Post-Op Procedure(s) (LRB): TOTAL KNEE ARTHROPLASTY (Right) Advance diet Up with therapy D/C IV fluids Discharge home with home health today after PT if doing well. Follow up in office 2 weeks post op Keep bandage clean and dry until follow up. Continue on 325mg  ASA BID x 2 weeks for DVT prevention.   Patient's anticipated LOS is less than 2 midnights, meeting these requirements: - Younger than 67 - Lives within 1 hour of care - Has a competent adult at home to recover with post-op recover - NO history of  - Chronic pain requiring opiods  - Diabetes  - Coronary Artery Disease  - Heart failure  - Heart attack  - Stroke  - DVT/VTE  - Cardiac arrhythmia  - Respiratory Failure/COPD  - Renal failure  - Anemia  - Advanced Liver disease   Pamela Hendricks 10/13/2018, 7:37 AM

## 2018-10-13 NOTE — Progress Notes (Signed)
Discharge instructions completed with pt.  Pt verbalized understanding of the information.  Pt denies chest pain, shortness of breath, dizziness, lightheadedness, and n/v.  Pt's IV discontinued.  Pt waiting for ride to be transported home.

## 2018-10-15 ENCOUNTER — Telehealth: Payer: Self-pay | Admitting: Nurse Practitioner

## 2018-10-15 NOTE — Telephone Encounter (Signed)
Pt states she has had a sore throat and wants to know if the CPAP could be the cause of it.  Please call

## 2018-10-18 NOTE — Telephone Encounter (Signed)
Left vm for patient to call back about if sore throat could be cause by using the CIPAP.

## 2018-10-19 ENCOUNTER — Telehealth: Payer: Self-pay

## 2018-10-19 NOTE — Telephone Encounter (Signed)
Left 2nd vm for patient to call back about if cipap is causing sore throat.

## 2018-10-19 NOTE — Telephone Encounter (Signed)
Made in error

## 2018-10-19 NOTE — Telephone Encounter (Signed)
Pamela Hendricks,   Yes CPAP can lead to throat irritation. The important factors are _ air leakage ? Is the mask no sealing well? And by the way - Cecille Rubin is in the office.

## 2018-10-20 NOTE — Telephone Encounter (Signed)
Rn return patients call about if the cipap can cause throat irritation. PT stated she has had the mask for a year,and is having a hard time adjusting to it. She has several mask she can use.Pt stated she spoke to a therapist and they told her how to fix the mask to avoid throat irritation. PT stated she had knee surgery last week in the hospital. PT stated she is doing fine now and thanks for the phone call. She is not hoarse anymore.

## 2018-10-20 NOTE — Telephone Encounter (Signed)
Pt has called, asking for a returned call from [place]

## 2018-11-11 ENCOUNTER — Encounter: Payer: Self-pay | Admitting: Physical Therapy

## 2018-11-11 ENCOUNTER — Ambulatory Visit: Payer: Medicare Other | Attending: Orthopaedic Surgery | Admitting: Physical Therapy

## 2018-11-11 ENCOUNTER — Other Ambulatory Visit: Payer: Self-pay

## 2018-11-11 DIAGNOSIS — M25661 Stiffness of right knee, not elsewhere classified: Secondary | ICD-10-CM | POA: Diagnosis present

## 2018-11-11 DIAGNOSIS — R262 Difficulty in walking, not elsewhere classified: Secondary | ICD-10-CM | POA: Diagnosis present

## 2018-11-11 DIAGNOSIS — M25561 Pain in right knee: Secondary | ICD-10-CM

## 2018-11-11 DIAGNOSIS — M6281 Muscle weakness (generalized): Secondary | ICD-10-CM | POA: Diagnosis present

## 2018-11-11 NOTE — Assessment & Plan Note (Signed)
Pt reports she has never had this and keeps getting asked about depression. She would like this diagnosis taken off if possible.

## 2018-11-11 NOTE — Therapy (Signed)
[facility] [address] [place], [address] Phone: [phone]   Fax:  [phone]  Physical Therapy Evaluation  Patient Details  Name: Pamela Hendricks MRN: [id number] Date of Birth: Nov 20, 1945 Referring Provider (PT): Melrose Nakayama, MD   Encounter Date: 11/11/2018  PT End of Session - 11/12/18 1138    Visit Number  1    Number of Visits  16    Date for PT Re-Evaluation  01/07/19    Authorization Type  Medicare     Authorization Time Period  Current Cert period: 01/16/7563 - 01/07/2018 (last PN: IE 11/11/2018)    Authorization - Visit Number  1    Authorization - Number of Visits  10    PT Start Time  1545    PT Stop Time  1630    PT Time Calculation (min)  45 min    Activity Tolerance  Patient tolerated treatment well;Patient limited by pain    Behavior During Therapy  [facility] for tasks assessed/performed       Past Medical History:  Diagnosis Date  . Arthritis   . Carotid artery disease ([place])    a. duplex 07/2017 - 1-39% stenosis bilaterally.  . Environmental and seasonal allergies   . GERD (gastroesophageal reflux disease)   . Hyperlipidemia   . Hypertension   . Migraines    sinus headaches, no longer having migraines  . OSA (obstructive sleep apnea)    CPAP  . Peripheral neuropathy   . Peripheral vascular disease ([place])    carotid blockage - is followed by Dr. Marlou Porch  . Snores   . Wears glasses     Past Surgical History:  Procedure Laterality Date  . ABDOMINAL HYSTERECTOMY  1970   Total HYST  . BREAST BIOPSY Bilateral 2001   benign  . BREAST EXCISIONAL BIOPSY Right 2013   sclerosing lesion  . BREAST LUMPECTOMY WITH RADIOACTIVE SEED LOCALIZATION Left 08/31/2018   Procedure: BREAST LUMPECTOMY WITH RADIOACTIVE SEED LOCALIZATION;  Surgeon: Fanny Skates, MD;  Location: [place];  Service: General;  Laterality: Left;  . COLONOSCOPY    . JOINT REPLACEMENT Left 2001   Left Knee Replacement  .  KNEE ARTHROSCOPY Left    lt  . NASAL SINUS SURGERY    . TOTAL KNEE ARTHROPLASTY Right 10/12/2018   Procedure: TOTAL KNEE ARTHROPLASTY;  Surgeon: Melrose Nakayama, MD;  Location: [facility];  Service: Orthopedics;  Laterality: Right;  . WISDOM TOOTH EXTRACTION      There were no vitals filed for this visit.   Subjective Assessment - 11/12/18 1127    Subjective  Patient states she underwent right TKA on 10/13/2018 after gradual worsening pain and dysfunction in the right knee over time. Pt originally stated things have been going poorly since her surgery. But later stated that was more related to pain and swelling. She states healing has been going well, but she is a bit concerned about a red/swollen spot at the distal end of her incision. She brought pictures from right after the surgery that show a lot of redness, bruising, and swelling around the incision in a similar but more severe pattern. Also shows lower leg edema. She states she just stopped home health PT within the past week. She states her knee stays hot and throbs. Her main goals are to get back to driving, cooking, back to her life (singing, volunteering at church, gardening). She accidentally went to our clinic at the hospital prior to coming  to the present clinic, and walked quite a ways prior to getting here.     Pertinent History  Patient is a 73 y.o. female who presents to outpatient physical therapy with a referral for medical diagnosis s/p R TKR performed 10/12/2018. This patient's chief complaints consist of pain, stiffness, swelling, weakness, reduced activity tolerance, and difficulty walking, leading to the following functional deficits difficulty with usual ADLs, IADLs, household and community mobility, sleeping, and usual community and volunteer activities.Relevant past medical history and comorbidities include history of left TKA, peripheral neuropathy, sleep apnea, headaches, carotid artery stenosis 39% bilaterally in 07/2017, history  of left hammer toe.     Limitations  Sitting;Standing;House hold activities;Walking;Lifting    Patient Stated Goals  get back to driving, cooking, back to her life (singing, volunteering at church, gardening).    Currently in Pain?  Yes    Pain Score  6     Pain Location  Knee    Pain Orientation  Right    Pain Descriptors / Indicators  Throbbing;Tingling    Pain Type  Surgical pain    Pain Radiating Towards  from distal 1/3 of R thigh through proximal 1/2 of R lower leg.     Pain Onset  1 to 4 weeks ago    Pain Frequency  Constant    Aggravating Factors   weight bearing, walking, using knee too much, being too inactive    Pain Relieving Factors  ice, pain medication    Effect of Pain on Daily Activities  difficulty with usual ADLs, IADLs, household and community mobility, sleeping, and usual community and volunteer activities.      SUBJECTIVE: HISTORY:  Patient is a 73 y.o. female who presents to outpatient physical therapy with a referral for medical diagnosis s/p R TKR performed 10/12/2018. This patient's chief complaints consist of pain, stiffness, swelling, weakness, reduced activity tolerance, and difficulty walking, leading to the following functional deficits difficulty with usual ADLs, IADLs, household and community mobility, sleeping, and usual community and volunteer activities. Relevant past medical history and comorbidities include history of left TKA, peripheral neuropathy, sleep apnea, headaches, carotid artery stenosis 39% bilaterally in 07/2017, history of left hammer toe.   Patient states she underwent right TKA on 10/13/2018 after gradual worsening pain and dysfunction in the right knee over time. Pt originally stated things have been going poorly since her surgery. But later stated that was more related to pain and swelling. She states healing has been going well, but she is a bit concerned about a red/swollen spot at the distal end of her incision. She brought pictures from  right after the surgery that show a lot of redness, bruising, and swelling around the incision in a similar but more severe pattern. Also shows lower leg edema. She states she just stopped home health PT within the past week. She states her knee stays hot and throbs. Her main goals are to get back to driving, cooking, back to her life (singing, volunteering at church, gardening). She accidentally went to our clinic at the hospital prior to coming to the present clinic, and walked quite a ways prior to getting here.    FUNCTION Patient-reported Outcome Measure: Did not successfully complete at initial eval. Will attempt at 2nd visit.   PAIN Location: anterior knee, mid lower right leg to distal 1/3 of thigh. Nature: throbbing, tingling. Pain Scale:  Patient reports current pain as 6/10, at best 4/10, at worst 20/10. Paresthesia: bilateral neuropathy, also has tingling in the  knee in the same region as the pain.  Aggravating factors: weight bearing Easing factors: rest, ice    [facility] PT Assessment - 11/12/18 0001      Assessment   Medical Diagnosis  s/p R TKA    Referring Provider (PT)  Melrose Nakayama, MD    Onset Date/Surgical Date  10/13/18    Next MD Visit  11/15/18    Prior Therapy  Acute care PT and HHPT with high pt satisfaction. Greatly improved motion and ability.       Precautions   Precautions  Knee    Precaution Comments  s/p TKA      Restrictions   Weight Bearing Restrictions  Yes    RLE Weight Bearing  Weight bearing as tolerated      Balance Screen   Has the patient fallen in the past 6 months  No    Has the patient had a decrease in activity level because of a fear of falling?   No    Is the patient reluctant to leave their home because of a fear of falling?   No      Home Social worker  Private residence    Living Arrangements  Alone    Available Help at Discharge  Friend(s)    Type of [place] to enter    Entrance  Stairs-Number of Steps  2    Entrance Stairs-Rails  None    Home Layout  One level    [place] - 2 wheels;Bedside commode;Other (comment)   forearm cane     Prior Function   Level of Independence  Independent    Vocation  Retired    [employer]  worked for different vendors at [employer]  like to do everything, involved in church, singing, gardening, roller skating  (but has not since left TKA).       Cognition   Overall Cognitive Status  Within Functional Limits for tasks assessed      Observation/Other Assessments   Observations  see note from 11/11/2017 for latest objective measures.     Focus on Therapeutic Outcomes (FOTO)   not completed        OBJECTIVE: OBSERVATION/INSPECTION: Patient presents with mild heat, redness, edema concentrated at distal incision mildly concerning for infection. Advised pt to go to urgent care if any discharge or worsening, will see referring provider Monday.  Also noted for bilateral LE edema.    PERIPHERAL JOINT MOTION (AROM/PROM in degrees):   Knee - Flexion: R = 95/98 painful, L = 110/112.  - Extension: R = -10 painful, L = 0.  STRENGTH:  *Indicates pain -  Hip  - Flexion: R = 3+/5*, L = 4/5. - Abduction: R = 2/5*, L = 4/5. Knee - Extension: R = 4/5*, L = 5/5. - Flex: R = 4/5*, L = 4/5. Ankle (seated position): B WNL for basic tasks  ACCESSORY MOTION:  - Not performed due to high sensitivity to touch.   PALPATION: - Highly TTP near distal incision. Generally TTP with hyperalgesia surrounding incision.   FUNCTIONAL MOBILITY: - Bed mobility: supine <> sit and rolling, I with difficulty due to pain - Transfers: I with use of BUE from 18" plinth - Gait: ambulates I without AD for household and short community distances. Antalgic gait favoring right LE. Lacks full right knee extension.  - Stairs: able to ascend and descend  four 6-inch steps step over step with BUE support or step to gait  favoring right LE with no UE assistance. Demonstrates careful and slow navigation.   FUNCTIONAL/BALANCE TESTS: 30 Second Sit to Stand Test: 13 reps with BUE push off from 18 inch plinth.  Timed Up and GO: 9 seconds first trial  Objective measurements completed on examination: See above findings.    TREATMENT:  - Education on diagnosis, prognosis, POC, anatomy and physiology of current condition.  - Review of HEP from HHPT with instructions to continue, stay active, and expect update at next session.  - Education on infection prevention and when to seek appropriate care.     PT Education - 11/12/18 1136    Education Details  instructions about when to seek care and emphasizing the importance of keeping her follow up appt with referring provider, instruction to continue exercises from HHP at this point. Discussed anatomy, physiology, funciton, POC of current condition.     Person(s) Educated  Patient    Methods  Explanation;Demonstration    Comprehension  Verbalized understanding;Returned demonstration       PT Short Term Goals - 11/12/18 1201      PT SHORT TERM GOAL #1   Title  Be independent with initial home exercise program for self-management of symptoms.    Baseline  Initial OP HEP to be provided visit 2.     Time  2    Period  Weeks    Status  New    Target Date  11/26/18      PT SHORT TERM GOAL #2   Title  Patient will be able to ascend and descend four 6-inch steps with step over step gait pattern and no UE support to improve functional mobility.     Baseline  see objective exam from 11/11/2018    Time  4    Period  Weeks    Status  New    Target Date  12/10/18      PT SHORT TERM GOAL #3   Title  Patient will be able to return to driving without limitations from right knee surgery to improve independence with IADLs.     Baseline  Not yet driving (0/07/7352)    Time  4    Period  Weeks    Status  New    Target Date  12/10/18      PT SHORT TERM GOAL #4   Title   Improve right knee AROM to equal or greater than 0 degrees extension and 110 flexion to allow patient to complete valued activities with less difficulty.     Baseline  see objective exam (11/11/2018)    Time  4    Period  Weeks    Status  New    Target Date  12/10/18      PT SHORT TERM GOAL #5   Title  Improve right hip abduction strength to 3+/5 for improved stability in SLS to allow patient to complete valued functional tasks such as walking, climbing/descending stairs, and getting in and out of care safely.    Baseline  MMT = 2/5 (11/11/2018)    Time  4    Period  Weeks    Status  New    Target Date  12/10/18        PT Long Term Goals - 11/12/18 1210      PT LONG TERM GOAL #1   Title  Be independent with a long-term home exercise program for self-management of  symptoms    Time  8    Period  Weeks    Status  New    Target Date  01/07/19      PT LONG TERM GOAL #2   Title  Demonstrate improved FOTO score by 10 units to demonstrate improvement in overall condition and self-reported functional ability.     Baseline  baseline to be assessed on visit 2    Time  8    Period  Weeks    Status  New    Target Date  01/07/19      PT LONG TERM GOAL #3   Title  Improve right knee PROM to equal or greater than 125 degrees flexion to allow patient to complete valued activities with less difficulty.     Baseline  98 degrees (11/11/2018)    Time  8    Period  Weeks    Status  New    Target Date  01/07/19      PT LONG TERM GOAL #4   Title  Improve B hip  strength to equal or greater than 4+/5 and bilateral knee strength to 5/5 for improved ability to complete valued functional tasks such as walking, climbing/descending stairs, community activities.    Baseline  R knee flex and ext both MMT = 4/5 (11/11/2017).    Time  8    Period  Weeks    Status  New    Target Date  01/07/19      PT LONG TERM GOAL #5   Title  Complete community, work and/or recreational activities without limitation due  to current condition.     Time  8    Period  Weeks    Status  New    Target Date  01/07/19             Plan - 11/12/18 1156    Clinical Impression Statement  Patient is a 73 y.o. female referred to outpatient physical therapy with a medical diagnosis of s/p R TKA performed 10/12/2018 who presents with signs and symptoms consistent with right knee pain, stiffness, swelling, weakness, and dysfunction s/p R TKA leading to decreased overall activity tolerance, physical conditioning and strength, disrupted gait pattern, and decreased ability to perform usual ADLs, IADLs, household and community mobility, usual social and community participation.    History and Personal Factors relevant to plan of care:  Relevant past medical history and comorbidities include history of left TKA, peripheral neuropathy, sleep apnea, headaches, carotid artery stenosis 39% bilaterally in 07/2017, history of left hammer toe.     Clinical Presentation  Evolving    Clinical Presentation due to:  ongoing monitoring for infection    Clinical Decision Making  Moderate    Rehab Potential  Good    Clinical Impairments Affecting Rehab Potential  (+) motivation, positive outlook, good response to PT so far; (-) multiple comorbidities, risk of infection, lives alone    PT Frequency  2x / week    PT Duration  8 weeks    PT Treatment/Interventions  ADLs/Self Care Home Management;Cryotherapy;Moist Heat;Gait training;Functional mobility training;Stair training;Therapeutic activities;Therapeutic exercise;Balance training;Neuromuscular re-education;Patient/family education;Manual techniques;Compression bandaging;Scar mobilization;Passive range of motion;Dry needling;Joint Manipulations;Other (comment)   joint mobilizations grades I-V   PT Next Visit Plan  Establish HEP and baseline FOTO and continue functional testing.     PT Home Exercise Plan  Advice to stay active, continue HEP from HHPT, will update on visit 2.     Consulted and  Agree with Plan  of Care  Patient         Patient will benefit from skilled therapeutic intervention in order to improve the following deficits and impairments:  Abnormal gait, Decreased activity tolerance, Decreased endurance, Decreased range of motion, Decreased skin integrity, Decreased strength, Hypomobility, Impaired perceived functional ability, Impaired sensation, Improper body mechanics, Pain, Decreased balance, Decreased mobility, Decreased scar mobility, Difficulty walking, Increased edema, Increased muscle spasms, Impaired flexibility, Obesity, Postural dysfunction  Visit Diagnosis: Right knee pain, unspecified chronicity  Stiffness of right knee, not elsewhere classified  Muscle weakness (generalized)  Difficulty in walking, not elsewhere classified     Problem List Patient Active Problem List   Diagnosis Date Noted  . Primary osteoarthritis of right knee 10/12/2018  . Obstructive sleep apnea treated with continuous positive airway pressure (CPAP) 01/28/2018  . OSA (obstructive sleep apnea) 08/27/2017  . Excessive daytime sleepiness 08/27/2017  . Paronychia of toe 06/16/2016  . Chronic sphenoidal sinusitis 08/13/2015  . Migraine 11/22/2014  . Sleep apnea 11/22/2014  . Abnormal MRI of head 07/04/2013  . Headache(784.0) 03/29/2013  . Peripheral neuropathy   . Major depressive disorder   . Abnormal mammogram 06/10/2012    Nancy Nordmann, PT, DPT 11/12/2018, 12:17 PM  [facility] [address], [place], [address] Phone: [phone]   Fax:  [phone]  Name: Pamela Hendricks MRN: [id number] Date of Birth: [birth year]

## 2018-11-12 ENCOUNTER — Encounter: Payer: Self-pay | Admitting: Physical Therapy

## 2018-11-16 ENCOUNTER — Ambulatory Visit: Payer: Medicare Other | Admitting: Physical Therapy

## 2018-11-16 ENCOUNTER — Encounter: Payer: Self-pay | Admitting: Physical Therapy

## 2018-11-16 DIAGNOSIS — M6281 Muscle weakness (generalized): Secondary | ICD-10-CM

## 2018-11-16 DIAGNOSIS — R262 Difficulty in walking, not elsewhere classified: Secondary | ICD-10-CM

## 2018-11-16 DIAGNOSIS — M25661 Stiffness of right knee, not elsewhere classified: Secondary | ICD-10-CM

## 2018-11-16 DIAGNOSIS — M25561 Pain in right knee: Secondary | ICD-10-CM

## 2018-11-16 NOTE — Therapy (Signed)
[facility] [address] [place], [address] Phone: [phone]   Fax:  [phone]  Physical Therapy Treatment  Patient Details  Name: Pamela Hendricks MRN: [id number] Date of Birth: December 15, 1945 Referring Provider (PT): Melrose Nakayama, MD   Encounter Date: 11/16/2018  PT End of Session - 11/16/18 1533    Visit Number  2    Number of Visits  16    Date for PT Re-Evaluation  01/07/19    Authorization Type  Medicare     Authorization Time Period  Current Cert period: 01/13/1442 - 01/07/2018 (last PN: IE 11/11/2018)    Authorization - Visit Number  2    Authorization - Number of Visits  10    PT Start Time  1400    PT Stop Time  1500    PT Time Calculation (min)  60 min    Activity Tolerance  Patient tolerated treatment well;Patient limited by pain    Behavior During Therapy  [facility] for tasks assessed/performed       Past Medical History:  Diagnosis Date  . Arthritis   . Carotid artery disease ([place])    a. duplex 07/2017 - 1-39% stenosis bilaterally.  . Environmental and seasonal allergies   . GERD (gastroesophageal reflux disease)   . Hyperlipidemia   . Hypertension   . Migraines    sinus headaches, no longer having migraines  . OSA (obstructive sleep apnea)    CPAP  . Peripheral neuropathy   . Peripheral vascular disease ([place])    carotid blockage - is followed by Dr. Marlou Porch  . Snores   . Wears glasses     Past Surgical History:  Procedure Laterality Date  . ABDOMINAL HYSTERECTOMY  1970   Total HYST  . BREAST BIOPSY Bilateral 2001   benign  . BREAST EXCISIONAL BIOPSY Right 2013   sclerosing lesion  . BREAST LUMPECTOMY WITH RADIOACTIVE SEED LOCALIZATION Left 08/31/2018   Procedure: BREAST LUMPECTOMY WITH RADIOACTIVE SEED LOCALIZATION;  Surgeon: Fanny Skates, MD;  Location: [facility];  Service: General;  Laterality: Left;  . COLONOSCOPY    . JOINT REPLACEMENT Left 2001   Left Knee Replacement  . KNEE  ARTHROSCOPY Left    lt  . NASAL SINUS SURGERY    . TOTAL KNEE ARTHROPLASTY Right 10/12/2018   Procedure: TOTAL KNEE ARTHROPLASTY;  Surgeon: Melrose Nakayama, MD;  Location: [facility];  Service: Orthopedics;  Laterality: Right;  . WISDOM TOOTH EXTRACTION      There were no vitals filed for this visit.  Subjective Assessment - 11/16/18 1407    Subjective  Patient reports 2/10 pain upon arrival. She states her physician follow up went well and it was determined she did not have an infection, just redness. Her medications were changed so she is now taking extra strength tylenol. She was a bit sore after her last physical therapy treatment session. She feels like her energy level is down because she has not done anything.  Her HEP is going well (from [facility]), but she is not doing everything.     Pertinent History  Patient is a 73 y.o. female who presents to outpatient physical therapy with a referral for medical diagnosis s/p R TKR performed 10/12/2018. This patient's chief complaints consist of pain, stiffness, swelling, weakness, reduced activity tolerance, and difficulty walking, leading to the following functional deficits difficulty with usual ADLs, IADLs, household and community mobility, sleeping, and usual community and volunteer activities.Relevant past medical history and  comorbidities include history of left TKA, peripheral neuropathy, sleep apnea, headaches, carotid artery stenosis 39% bilaterally in 07/2017, history of left hammer toe.     Limitations  Sitting;Standing;House hold activities;Walking;Lifting    How long can you sit comfortably?  chair < 15 min    How long can you stand comfortably?  < 5 min    How long can you walk comfortably?  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    Currently in Pain?  Yes    Pain Score  2     Pain Location  Knee    Pain Orientation  Right    Pain Type  Surgical pain    Pain  Onset  1 to 4 weeks ago         [facility] PT Assessment - 11/16/18 0001      Observation/Other Assessments   Observations  see note from 11/16/2018 for 6MWT baseline        OBJECTIVE:  AAROM Right knee:  - Flexion: 115 (last measured 11/16/18/20) - Extension: -10 (last measured 11/16/2018) - 6MWT: 800 feet (last measured 11/16/2018) Observation:  - redness at distal incision decreased since last visit  TREATMENT:  Therapeutic exercise: to centralize symptoms and improve ROM and strength required for successful completion of functional activities.  - Recumbent Bike no added resistance at closest tolerable seat position to improve knee flexion. Seat position 9. RPM mid 28s. For improved lower extremity ROM, muscular endurance, and activity tolerance; and to induce the analgesic effect of aerobic exercise, stimulate joint nutrition, and prepare body structures and systems for following interventions. x 7 min during subjective exam and education about HEP.  - supine R quad sets over double towel x15, cuing for proper quad activation.  - hooklying R straight leg raises with quad set x 15. Cuing for strong quad set.  - hooklying R hamstring stretch with strap and therapist assistance, x 10 seconds, 3x20 seconds. Cuing to keep knees straight.  - hooklying bridges x 15, cuing for glute squeeze and strong hip extension. - sidelying clam shell, x 20 each side. Cuing to keep pelvis still.  - supine short arc quad 2x 10 each side, very painful at first. Cuing for strong quad contraction. - seated long arc quad 2x 10 each side, cuing for strong quad contraction.  Therapeutic activities: for functional strengthening and improved functional activity tolerance. - sit to stands from 18 inch chair no UE support, x10 - 6MWT = 8000 feet no AD and no breaks. SBA x 1. Cuing to stay on task.  Patient response to treatment:  Pt tolerated treatment well. Pt was able to complete all exercises with some difficulty due  to pain. She was highly sensitive to touch with allodynia present over anterior right knee and was educated on how to use touch with various objects to start working on desensitization. Pt's scar would benefit from cross friction scar massage when tolerated (pt unable to tolerate today). Patient is highly motivated to get better but was able to to tolerate only 15 reps of several exercises. She continues to demonstrate altered gait pattern with antalgic gait noted favoring right LE. She had less pain with weightbearing exercises compared to mat exercises. Pt required cuing for proper technique and to facilitate improved neuromuscular control, strength, range of motion, and functional ability. She is making good progress towards goals at this point.  PT Education - 11/16/18 1532    Education Details  exercise form/technique/purpose. self-management advice    Person(s) Educated  Patient    Methods  Explanation;Demonstration;Tactile cues;Verbal cues    Comprehension  Verbalized understanding;Returned demonstration       PT Short Term Goals - 11/12/18 1201      PT SHORT TERM GOAL #1   Title  Be independent with initial home exercise program for self-management of symptoms.    Baseline  Initial OP HEP to be provided visit 2.     Time  2    Period  Weeks    Status  New    Target Date  11/26/18      PT SHORT TERM GOAL #2   Title  Patient will be able to ascend and descend four 6-inch steps with step over step gait pattern and no UE support to improve functional mobility.     Baseline  see objective exam from 11/11/2018    Time  4    Period  Weeks    Status  New    Target Date  12/10/18      PT SHORT TERM GOAL #3   Title  Patient will be able to return to driving without limitations from right knee surgery to improve independence with IADLs.     Baseline  Not yet driving (07/19/2425)    Time  4    Period  Weeks    Status  New    Target Date  12/10/18      PT SHORT TERM GOAL #4   Title   Improve right knee AROM to equal or greater than 0 degrees extension and 110 flexion to allow patient to complete valued activities with less difficulty.     Baseline  see objective exam (11/11/2018)    Time  4    Period  Weeks    Status  New    Target Date  12/10/18      PT SHORT TERM GOAL #5   Title  Improve right hip abduction strength to 3+/5 for improved stability in SLS to allow patient to complete valued functional tasks such as walking, climbing/descending stairs, and getting in and out of care safely.    Baseline  MMT = 2/5 (11/11/2018)    Time  4    Period  Weeks    Status  New    Target Date  12/10/18        PT Long Term Goals - 11/12/18 1210      PT LONG TERM GOAL #1   Title  Be independent with a long-term home exercise program for self-management of symptoms    Time  8    Period  Weeks    Status  New    Target Date  01/07/19      PT LONG TERM GOAL #2   Title  Demonstrate improved FOTO score by 10 units to demonstrate improvement in overall condition and self-reported functional ability.     Baseline  baseline to be assessed on visit 2    Time  8    Period  Weeks    Status  New    Target Date  01/07/19      PT LONG TERM GOAL #3   Title  Improve right knee PROM to equal or greater than 125 degrees flexion to allow patient to complete valued activities with less difficulty.     Baseline  98 degrees (11/11/2018)    Time  8  Period  Weeks    Status  New    Target Date  01/07/19      PT LONG TERM GOAL #4   Title  Improve B hip  strength to equal or greater than 4+/5 and bilateral knee strength to 5/5 for improved ability to complete valued functional tasks such as walking, climbing/descending stairs, community activities.    Baseline  R knee flex and ext both MMT = 4/5 (11/11/2017).    Time  8    Period  Weeks    Status  New    Target Date  01/07/19      PT LONG TERM GOAL #5   Title  Complete community, work and/or recreational activities without limitation due  to current condition.     Time  8    Period  Weeks    Status  New    Target Date  01/07/19            Plan - 11/16/18 1537    Clinical Impression Statement  Pt tolerated treatment well. Pt was able to complete all exercises with some difficulty due to pain. She was highly sensitive to touch with allodynia present over anterior right knee and was educated on how to use touch with various objects to start working on desensitization. Pt's scar would benefit from cross friction scar massage when tolerated (pt unable to tolerate today). Patient is highly motivated to get better but was able to to tolerate only 15 reps of several exercises. She continues to demonstrate altered gait pattern with antalgic gait noted favoring right LE. She had less pain with weightbearing exercises compared to mat exercises. Pt required cuing for proper technique and to facilitate improved neuromuscular control, strength, range of motion, and functional ability. She is making good progress towards goals at this point.     Rehab Potential  Good    Clinical Impairments Affecting Rehab Potential  (+) motivation, positive outlook, good response to PT so far; (-) multiple comorbidities, risk of infection, lives alone    PT Frequency  2x / week    PT Duration  8 weeks    PT Treatment/Interventions  ADLs/Self Care Home Management;Cryotherapy;Moist Heat;Gait training;Functional mobility training;Stair training;Therapeutic activities;Therapeutic exercise;Balance training;Neuromuscular re-education;Patient/family education;Manual techniques;Compression bandaging;Scar mobilization;Passive range of motion;Dry needling;Joint Manipulations;Other (comment)   joint mobilizations grades I-V   PT Next Visit Plan  Establish baseline FOTO, provide HEP handout (already created, but pt left without it), progress strengthening and mobility exercises as tolerated. Scar massage as tolerated .    PT Home Exercise Plan  Advice to stay active,  continue HEP from HHPT, will update on visit 2.     Recommended Other Services  [employer] Access Code: [email or web address]     Consulted and Agree with Plan of Care  Patient       Patient will benefit from skilled therapeutic intervention in order to improve the following deficits and impairments:  Abnormal gait, Decreased activity tolerance, Decreased endurance, Decreased range of motion, Decreased skin integrity, Decreased strength, Hypomobility, Impaired perceived functional ability, Impaired sensation, Improper body mechanics, Pain, Decreased balance, Decreased mobility, Decreased scar mobility, Difficulty walking, Increased edema, Increased muscle spasms, Impaired flexibility, Obesity, Postural dysfunction  Visit Diagnosis: Right knee pain, unspecified chronicity  Stiffness of right knee, not elsewhere classified  Muscle weakness (generalized)  Difficulty in walking, not elsewhere classified     Problem List Patient Active Problem List   Diagnosis Date Noted  . Primary osteoarthritis of right knee 10/12/2018  . Obstructive  sleep apnea treated with continuous positive airway pressure (CPAP) 01/28/2018  . OSA (obstructive sleep apnea) 08/27/2017  . Excessive daytime sleepiness 08/27/2017  . Paronychia of toe 06/16/2016  . Chronic sphenoidal sinusitis 08/13/2015  . Migraine 11/22/2014  . Sleep apnea 11/22/2014  . Abnormal MRI of head 07/04/2013  . Headache(784.0) 03/29/2013  . Peripheral neuropathy   . Major depressive disorder   . Abnormal mammogram 06/10/2012    Nancy Nordmann, PT, DPT 11/16/2018, 3:40 PM  [facility] [address] [place], [address] Phone: [phone]   Fax:  [phone]  Name: DEMETRESS TIFT MRN: [id number] Date of Birth: 03/20/1946

## 2018-11-18 ENCOUNTER — Ambulatory Visit: Payer: Medicare Other | Admitting: Physical Therapy

## 2018-11-23 ENCOUNTER — Encounter: Payer: Self-pay | Admitting: Physical Therapy

## 2018-11-23 ENCOUNTER — Ambulatory Visit: Payer: Medicare Other | Admitting: Physical Therapy

## 2018-11-23 DIAGNOSIS — M25561 Pain in right knee: Secondary | ICD-10-CM

## 2018-11-23 DIAGNOSIS — M6281 Muscle weakness (generalized): Secondary | ICD-10-CM

## 2018-11-23 DIAGNOSIS — M25661 Stiffness of right knee, not elsewhere classified: Secondary | ICD-10-CM

## 2018-11-23 DIAGNOSIS — R262 Difficulty in walking, not elsewhere classified: Secondary | ICD-10-CM

## 2018-11-23 NOTE — Therapy (Signed)
[facility] [address] [place], [address] Phone: [phone]   Fax:  [phone]  Physical Therapy Treatment  Patient Details  Name: Pamela Hendricks MRN: [id number] Date of Birth: 08-Jul-1946 Referring Provider (PT): Melrose Nakayama, MD   Encounter Date: 11/23/2018  PT End of Session - 11/23/18 1050    Visit Number  3    Number of Visits  16    Date for PT Re-Evaluation  01/07/19    Authorization Type  Medicare reporting period from 11/11/2018    Authorization Time Period  Current Cert period: 12/19/5282 - 01/07/2018 (last PN: IE 11/11/2018)    Authorization - Visit Number  3    Authorization - Number of Visits  10    PT Start Time  1040    PT Stop Time  1125    PT Time Calculation (min)  45 min    Activity Tolerance  Patient tolerated treatment well;Patient limited by pain    Behavior During Therapy  [employer] for tasks assessed/performed       Past Medical History:  Diagnosis Date  . Arthritis   . Carotid artery disease ([place])    a. duplex 07/2017 - 1-39% stenosis bilaterally.  . Environmental and seasonal allergies   . GERD (gastroesophageal reflux disease)   . Hyperlipidemia   . Hypertension   . Migraines    sinus headaches, no longer having migraines  . OSA (obstructive sleep apnea)    CPAP  . Peripheral neuropathy   . Peripheral vascular disease ([place])    carotid blockage - is followed by Dr. Marlou Porch  . Snores   . Wears glasses     Past Surgical History:  Procedure Laterality Date  . ABDOMINAL HYSTERECTOMY  1970   Total HYST  . BREAST BIOPSY Bilateral 2001   benign  . BREAST EXCISIONAL BIOPSY Right 2013   sclerosing lesion  . BREAST LUMPECTOMY WITH RADIOACTIVE SEED LOCALIZATION Left 08/31/2018   Procedure: BREAST LUMPECTOMY WITH RADIOACTIVE SEED LOCALIZATION;  Surgeon: Fanny Skates, MD;  Location: [place];  Service: General;  Laterality: Left;  . COLONOSCOPY    . JOINT REPLACEMENT Left 2001    Left Knee Replacement  . KNEE ARTHROSCOPY Left    lt  . NASAL SINUS SURGERY    . TOTAL KNEE ARTHROPLASTY Right 10/12/2018   Procedure: TOTAL KNEE ARTHROPLASTY;  Surgeon: Melrose Nakayama, MD;  Location: [facility];  Service: Orthopedics;  Laterality: Right;  . WISDOM TOOTH EXTRACTION      There were no vitals filed for this visit.  Subjective Assessment - 11/23/18 1043    Subjective  Patient states she was sick last week and missed her last appointment. She has not do her HEP. She is tired today and feeling a bit icky all of a sudden.  She rates her right knee pain 5/10 upon arrivall over the anterior knee. She has been able to rub her knee with a cold rag to help reduce sensitivity. She keeps feeling a grabbing catch that makes her worried she will fall. She almost used her walker last week after having strongly elevated pain and soreness following last visit.  She reports she has had 5 friends pass away over the holidays and has not been able to go to any of thier funerals.  She is having a very hard time sleeping. She is looking forward to going back to [place] this Sunday.     Pertinent History  Patient is a 73  y.o. female who presents to outpatient physical therapy with a referral for medical diagnosis s/p R TKR performed 10/12/2018. This patient's chief complaints consist of pain, stiffness, swelling, weakness, reduced activity tolerance, and difficulty walking, leading to the following functional deficits difficulty with usual ADLs, IADLs, household and community mobility, sleeping, and usual community and volunteer activities.Relevant past medical history and comorbidities include history of left TKA, peripheral neuropathy, sleep apnea, headaches, carotid artery stenosis 39% bilaterally in 07/2017, history of left hammer toe.     Limitations  Sitting;Standing;House hold activities;Walking;Lifting    How long can you sit comfortably?  chair < 15 min    How long can you stand comfortably?  < 5 min     How long can you walk comfortably?  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    Pain Score  5     Pain Location  Knee    Pain Orientation  Right;Anterior    Pain Descriptors / Indicators  Throbbing    Pain Type  Surgical pain    Pain Onset  1 to 4 weeks ago    Multiple Pain Sites  --        OBJECTIVE:  AAROM Right knee:  - Flexion: 115 (last measured 11/16/18/20) - Extension: -10 (last measured 11/16/2018) - 6MWT: 800 feet (last measured 11/16/2018) Observation:  - redness at distal incision decreased since last visit  TREATMENT:  Therapeutic exercise: to centralize symptoms and improve ROM and strength required for successful completion of functional activities.  - Recumbent Bike no added resistance at closest tolerable seat position to improve knee flexion. Seat position 9. RPM low 30s. For improved lower extremity ROM, muscular endurance, and activity tolerance; and to induce the analgesic effect of aerobic exercise, stimulate joint nutrition, and prepare body structures and systems for following interventions. x 5 min during subjective exam and education about HEP.  - supine R quad sets over double towel, 5 second holds, x 2 min plus time for appropriate rest and transitions. To improve quad strength for walking stability. cuing for proper quad activation.  - hooklying R straight leg raises with quad set, 5 second holds, x 2 min plus time for appropriate rest and transitions. To improve quad strength for walking stability. cuing for proper quad activation. Stopped 20 seconds early due to fatigue.  - hooklying R hamstring stretch with strap and therapist assistance, 3x30 seconds. Cuing to keep knees straight. With manual assist to maintain straight knee.  - hooklying bridges 2x10, cuing for glute squeeze and strong hip extension. To improve hip strength for completing stairs and transfers.  - supine short  arc quad 3x 10 each side, very painful at first. Cuing for strong quad contraction. To improve quad strength for functional mobility.  - seated long arc quad 3x 10 each side, cuing for strong quad contraction.To improve quad strength for functional mobility and improved activity tolerance.   Manual therapy: to reduce pain and tissue tension, improve range of motion, neuromodulation, in order to promote improved ability to complete functional activities. - tactile desensitization using light touch to light pressure and light pressure over scar for mobilization as tolerated. Pt unable to tolerate light touch over incision site at first and progressed to tolerating soft pressure and gentle skin mobilization over entire scar. Most tender near distal scar and over patella.   HOME EXERCISE PROGRAM Access Code: [email or web address]  URL: [email or web address]  Date: 11/16/2018  Prepared by: Rosita Kea   Exercises  Active Straight Leg Raise with Quad Set - 10-15 reps - 5 second hold - 1-3 Sets - 1x daily - 7x weekly  Supine Heel Slide with Strap - 10-15 reps - 5 second hold - 1-3 Sets - 1x daily - 7x weekly  Supine Bridge - 10-15 reps - 1 second hold - 1-3 Sets - 1x daily - 7x weekly  Clamshell - 10-15 reps - 1 second hold - 1-3 Sets - 1x daily - 7x weekly  Sit to Stand without Arm Support - 10-15 reps - 1 second hold - 3 Sets - 1x daily - 7x weekly    Patient response to treatment:  Pt tolerated treatment fair. She reported her knee felt better at the end of the visit but had some tolerable discomfort during some exercises and manual. Pt was able to complete all exercises with some difficulty due to pain. She was highly sensitive to touch with allodynia present over anterior right knee which decreased with manual intervention. Pt's scar would benefit from cross friction scar massage when tolerated (pt able to tolerate light superficial mobilization). Patient is highly motivated to get better and was  able to tolerate exercises better this session compared to last treatment. Weigh bearing exercises were avoided due to very high level of pain reported for 3 days following last session. She continues to demonstrate altered gait pattern with antalgic gait noted favoring right LE.  Pt required cuing for proper technique and to facilitate improved neuromuscular control, strength, range of motion, and functional ability.   PT Education - 11/23/18 1050    Education Details  exercie form/technique/purpose. self-management advice    Person(s) Educated  Patient    Methods  Explanation;Demonstration;Tactile cues;Verbal cues    Comprehension  Verbalized understanding;Returned demonstration       PT Short Term Goals - 11/12/18 1201      PT SHORT TERM GOAL #1   Title  Be independent with initial home exercise program for self-management of symptoms.    Baseline  Initial OP HEP to be provided visit 2.     Time  2    Period  Weeks    Status  New    Target Date  11/26/18      PT SHORT TERM GOAL #2   Title  Patient will be able to ascend and descend four 6-inch steps with step over step gait pattern and no UE support to improve functional mobility.     Baseline  see objective exam from 11/11/2018    Time  4    Period  Weeks    Status  New    Target Date  12/10/18      PT SHORT TERM GOAL #3   Title  Patient will be able to return to driving without limitations from right knee surgery to improve independence with IADLs.     Baseline  Not yet driving (01/11/1962)    Time  4    Period  Weeks    Status  New    Target Date  12/10/18      PT SHORT TERM GOAL #4   Title  Improve right knee AROM to equal or greater than 0 degrees extension and 110 flexion to allow patient to complete valued activities with less difficulty.     Baseline  see objective exam (11/11/2018)    Time  4    Period  Weeks    Status  New  Target Date  12/10/18      PT SHORT TERM GOAL #5   Title  Improve right hip abduction  strength to 3+/5 for improved stability in SLS to allow patient to complete valued functional tasks such as walking, climbing/descending stairs, and getting in and out of care safely.    Baseline  MMT = 2/5 (11/11/2018)    Time  4    Period  Weeks    Status  New    Target Date  12/10/18        PT Long Term Goals - 11/12/18 1210      PT LONG TERM GOAL #1   Title  Be independent with a long-term home exercise program for self-management of symptoms    Time  8    Period  Weeks    Status  New    Target Date  01/07/19      PT LONG TERM GOAL #2   Title  Demonstrate improved FOTO score by 10 units to demonstrate improvement in overall condition and self-reported functional ability.     Baseline  baseline to be assessed on visit 2    Time  8    Period  Weeks    Status  New    Target Date  01/07/19      PT LONG TERM GOAL #3   Title  Improve right knee PROM to equal or greater than 125 degrees flexion to allow patient to complete valued activities with less difficulty.     Baseline  98 degrees (11/11/2018)    Time  8    Period  Weeks    Status  New    Target Date  01/07/19      PT LONG TERM GOAL #4   Title  Improve B hip  strength to equal or greater than 4+/5 and bilateral knee strength to 5/5 for improved ability to complete valued functional tasks such as walking, climbing/descending stairs, community activities.    Baseline  R knee flex and ext both MMT = 4/5 (11/11/2017).    Time  8    Period  Weeks    Status  New    Target Date  01/07/19      PT LONG TERM GOAL #5   Title  Complete community, work and/or recreational activities without limitation due to current condition.     Time  8    Period  Weeks    Status  New    Target Date  01/07/19            Plan - 11/23/18 1128    Clinical Impression Statement  Patient has attended three treatment sessions this episode of care. She is making progress towards goals at this point but has demonstrated difficulty tolerating  exercises from last session. Therefore, her exercises were modified today to make treatment more tolerable. Pt did well with tolerating exercises today and will be re-assessed for tolerance later in the week when she comes back for her next session. Patient is a 73 y.o. female referred to outpatient physical therapy with a medical diagnosis of s/p R TKA performed 10/12/2018 who presents with signs and symptoms consistent with right knee pain, stiffness, swelling, weakness, and dysfunction s/p R TKA leading to decreased overall activity tolerance, physical conditioning and strength, disrupted gait pattern, and decreased ability to perform usual ADLs, IADLs, household and community mobility, usual social and community participation. Patient will benefit from further physical therapy intervention to address her remaining impairments and functional  deficits, work towards stated goals and return to PLOF.     Rehab Potential  Good    Clinical Impairments Affecting Rehab Potential  (+) motivation, positive outlook, good response to PT so far; (-) multiple comorbidities, risk of infection, lives alone    PT Frequency  2x / week    PT Duration  8 weeks    PT Treatment/Interventions  ADLs/Self Care Home Management;Cryotherapy;Moist Heat;Gait training;Functional mobility training;Stair training;Therapeutic activities;Therapeutic exercise;Balance training;Neuromuscular re-education;Patient/family education;Manual techniques;Compression bandaging;Scar mobilization;Passive range of motion;Dry needling;Joint Manipulations;Other (comment)   joint mobilizations grades I-V   PT Next Visit Plan  Establish baseline FOTO, provide HEP handout (already created, but pt left without it), progress strengthening and mobility exercises as tolerated. Scar massage as tolerated .    PT Home Exercise Plan  Medbridge Access Code: [email or web address]     Consulted and Agree with Plan of Care  Patient       Patient will benefit from skilled  therapeutic intervention in order to improve the following deficits and impairments:  Abnormal gait, Decreased activity tolerance, Decreased endurance, Decreased range of motion, Decreased skin integrity, Decreased strength, Hypomobility, Impaired perceived functional ability, Impaired sensation, Improper body mechanics, Pain, Decreased balance, Decreased mobility, Decreased scar mobility, Difficulty walking, Increased edema, Increased muscle spasms, Impaired flexibility, Obesity, Postural dysfunction  Visit Diagnosis: Right knee pain, unspecified chronicity  Stiffness of right knee, not elsewhere classified  Muscle weakness (generalized)  Difficulty in walking, not elsewhere classified     Problem List Patient Active Problem List   Diagnosis Date Noted  . Primary osteoarthritis of right knee 10/12/2018  . Obstructive sleep apnea treated with continuous positive airway pressure (CPAP) 01/28/2018  . OSA (obstructive sleep apnea) 08/27/2017  . Excessive daytime sleepiness 08/27/2017  . Paronychia of toe 06/16/2016  . Chronic sphenoidal sinusitis 08/13/2015  . Migraine 11/22/2014  . Sleep apnea 11/22/2014  . Abnormal MRI of head 07/04/2013  . Headache(784.0) 03/29/2013  . Peripheral neuropathy   . Major depressive disorder   . Abnormal mammogram 06/10/2012    Nancy Nordmann, PT, DPT 11/23/2018, 11:50 AM  [facility] [address] [place], [address] Phone: [phone]   Fax:  [phone]  Name: Pamela Hendricks MRN: [id number] Date of Birth: 1946/09/24

## 2018-11-25 ENCOUNTER — Ambulatory Visit: Payer: Medicare Other | Admitting: Physical Therapy

## 2018-11-30 ENCOUNTER — Ambulatory Visit: Payer: Medicare Other | Admitting: Physical Therapy

## 2018-11-30 ENCOUNTER — Encounter: Payer: Self-pay | Admitting: Physical Therapy

## 2018-11-30 DIAGNOSIS — M25561 Pain in right knee: Secondary | ICD-10-CM | POA: Diagnosis not present

## 2018-11-30 DIAGNOSIS — M6281 Muscle weakness (generalized): Secondary | ICD-10-CM

## 2018-11-30 DIAGNOSIS — R262 Difficulty in walking, not elsewhere classified: Secondary | ICD-10-CM

## 2018-11-30 DIAGNOSIS — M25661 Stiffness of right knee, not elsewhere classified: Secondary | ICD-10-CM

## 2018-11-30 NOTE — Therapy (Signed)
[facility] [address] [place], [address] Phone: [phone]   Fax:  [phone]  Physical Therapy Treatment  Patient Details  Name: OTIE HEADLEE MRN: [id number] Date of Birth: 08-07-1946 Referring Provider (PT): Melrose Nakayama, MD   Encounter Date: 11/30/2018  PT End of Session - 11/30/18 1007    Visit Number  4    Number of Visits  16    Date for PT Re-Evaluation  01/07/19    Authorization Type  Medicare reporting period from 11/11/2018    Authorization Time Period  Current Cert period: 01/11/1961 - 01/07/2018 (last PN: IE 11/11/2018)    Authorization - Visit Number  4    Authorization - Number of Visits  10    PT Start Time  0955    PT Stop Time  1035    PT Time Calculation (min)  40 min    Activity Tolerance  Patient tolerated treatment well;Patient limited by pain    Behavior During Therapy  [facility] for tasks assessed/performed       Past Medical History:  Diagnosis Date  . Arthritis   . Carotid artery disease ([place])    a. duplex 07/2017 - 1-39% stenosis bilaterally.  . Environmental and seasonal allergies   . GERD (gastroesophageal reflux disease)   . Hyperlipidemia   . Hypertension   . Migraines    sinus headaches, no longer having migraines  . OSA (obstructive sleep apnea)    CPAP  . Peripheral neuropathy   . Peripheral vascular disease ([place])    carotid blockage - is followed by Dr. Marlou Porch  . Snores   . Wears glasses     Past Surgical History:  Procedure Laterality Date  . ABDOMINAL HYSTERECTOMY  1970   Total HYST  . BREAST BIOPSY Bilateral 2001   benign  . BREAST EXCISIONAL BIOPSY Right 2013   sclerosing lesion  . BREAST LUMPECTOMY WITH RADIOACTIVE SEED LOCALIZATION Left 08/31/2018   Procedure: BREAST LUMPECTOMY WITH RADIOACTIVE SEED LOCALIZATION;  Surgeon: Fanny Skates, MD;  Location: [facility];  Service: General;  Laterality: Left;  . COLONOSCOPY    . JOINT REPLACEMENT Left 2001    Left Knee Replacement  . KNEE ARTHROSCOPY Left    lt  . NASAL SINUS SURGERY    . TOTAL KNEE ARTHROPLASTY Right 10/12/2018   Procedure: TOTAL KNEE ARTHROPLASTY;  Surgeon: Melrose Nakayama, MD;  Location: [facility];  Service: Orthopedics;  Laterality: Right;  . WISDOM TOOTH EXTRACTION      There were no vitals filed for this visit.  Subjective Assessment - 11/30/18 1002    Subjective  Patient reports she continues to have high levels of pain but was no worse after last treatment session. She was able to go to [place] this Sunday, then a funeral, and then back to [place]. She had a wonderful time at [facility] but her knee pain also increased. She has been using her cane some becuase her knee keeps feeling like it is "grabbing" she rate her pain as 4/10 "pretty low" today upon arrival.  She is not doing a lot of exercises but is doing stretches and using a lot of ice. She does not want to do the scar massage today but states she is using a cold rag at home to complete the same thing.     Pertinent History  Patient is a 73 y.o. female who presents to outpatient physical therapy with a referral for medical diagnosis s/p R TKR  performed 10/12/2018. This patient's chief complaints consist of pain, stiffness, swelling, weakness, reduced activity tolerance, and difficulty walking, leading to the following functional deficits difficulty with usual ADLs, IADLs, household and community mobility, sleeping, and usual community and volunteer activities.Relevant past medical history and comorbidities include history of left TKA, peripheral neuropathy, sleep apnea, headaches, carotid artery stenosis 39% bilaterally in 07/2017, history of left hammer toe.     Limitations  Sitting;Standing;House hold activities;Walking;Lifting    How long can you sit comfortably?  chair < 15 min    How long can you stand comfortably?  < 5 min    How long can you walk comfortably?  < 0 min    Patient Stated Goals  get back to driving, cooking, back  to her life (singing, volunteering at church, gardening).    Currently in Pain?  Yes    Pain Score  4     Pain Location  Knee    Pain Orientation  Anterior;Right    Pain Descriptors / Indicators  Throbbing    Pain Type  Surgical pain    Pain Onset  1 to 4 weeks ago    Pain Frequency  Constant         OBJECTIVE:  R Knee AAROM:   - flexion: 110 ERP - extension: lacking ~ 10 degrees    TREATMENT: Therapeutic exercise:to centralize symptoms and improve ROM and strength required for successful completion of functional activities.  -Recumbent Bikeno added resistance at closest tolerable seat position to improve knee flexion. Seat position 8. RPM low 30s.For improved lower extremity ROM, muscular endurance, and activity tolerance; and to induce the analgesic effect of aerobic exercise, stimulate joint nutrition, and prepare body structures and systems for following interventions. x 5 min during subjective exam and education about HEP.  - R knee AAROM flexion stretch on bike moving seat progressively closer. Able to get to seat setting 2 with full revolution with discomfort and some mild compensation. Tolerable stretch at seat setting 4 for > 2 minutes plus time for education, appropriate rest and transitions.  - R knee quad sets with therapist assistance to get to end range.  - supine R quad sets over ball with heel lifted on bolster, 5 second holds, x 2 min plus time for appropriate rest and transitions. To improve quad strength for walking stability. cuing for proper quad activation.  - hooklying R straight leg raises with quad set for appropriate rest and transitions. To improve quad strength for walking stability. cuing for proper quad activation. 2x10 - education on [facility] selection and how to properly adjust and walk with cane. Trial of SPC in clinic with pt able to demonstrate and teach back proper adjustments and ambulation pattern.   HOME EXERCISE PROGRAM Access Code: [email or web address]   URL: [email or web address]  Date: 11/16/2018  Prepared by: Rosita Kea   Exercises   Active Straight Leg Raise with Quad Set - 10-15 reps - 5 second hold - 1-3 Sets - 1x daily - 7x weekly   Supine Heel Slide with Strap - 10-15 reps - 5 second hold - 1-3 Sets - 1x daily - 7x weekly   Supine Bridge - 10-15 reps - 1 second hold - 1-3 Sets - 1x daily - 7x weekly   Clamshell - 10-15 reps - 1 second hold - 1-3 Sets - 1x daily - 7x weekly   Sit to Stand without Arm Support - 10-15 reps - 1 second hold - 3 Sets - 1x daily - 7x  weekly    Patient response to treatment:  Pt tolerated treatment fair. She was quite hesitant at first and described high levels of pain that prevented her form attending her last PT appointment but when questioned further it appears her function is actually getting better overall and her pain is associated with increased activity in daily life. She appears to be more stable and confident with a cane and was able to do teach back accurately about proper sizing and use. She felt her knee was more mobile by end of session. Pt was able to complete all exerciseswith some difficulty due to pain. Did not do manual desensitization today at pt request. Pt's incision site appears to be healing well without signs of infection. Pt's scar would benefit from cross friction scar massage when tolerated (pt able to tolerate light superficial mobilization). Exercises were kept light due to pt report of difficulty attending due to pain. She continues to demonstrate altered gait pattern with antalgic gait noted favoring right LE. Pt required cuing for proper technique and to facilitate improved neuromuscular control, strength, range of motion, and functional ability.    PT Education - 11/30/18 1007    Education Details  Exercise purpose/form. Self management techniques. Education on diagnosis, prognosis, POC, anatomy and physiology of current condition    Person(s) Educated   Patient    Methods  Explanation;Demonstration;Tactile cues;Verbal cues    Comprehension  Returned demonstration;Verbalized understanding       PT Short Term Goals - 11/12/18 1201      PT SHORT TERM GOAL #1   Title  Be independent with initial home exercise program for self-management of symptoms.    Baseline  Initial OP HEP to be provided visit 2.     Time  2    Period  Weeks    Status  New    Target Date  11/26/18      PT SHORT TERM GOAL #2   Title  Patient will be able to ascend and descend four 6-inch steps with step over step gait pattern and no UE support to improve functional mobility.     Baseline  see objective exam from 11/11/2018    Time  4    Period  Weeks    Status  New    Target Date  12/10/18      PT SHORT TERM GOAL #3   Title  Patient will be able to return to driving without limitations from right knee surgery to improve independence with IADLs.     Baseline  Not yet driving (4/0/9735)    Time  4    Period  Weeks    Status  New    Target Date  12/10/18      PT SHORT TERM GOAL #4   Title  Improve right knee AROM to equal or greater than 0 degrees extension and 110 flexion to allow patient to complete valued activities with less difficulty.     Baseline  see objective exam (11/11/2018)    Time  4    Period  Weeks    Status  New    Target Date  12/10/18      PT SHORT TERM GOAL #5   Title  Improve right hip abduction strength to 3+/5 for improved stability in SLS to allow patient to complete valued functional tasks such as walking, climbing/descending stairs, and getting in and out of care safely.    Baseline  MMT = 2/5 (11/11/2018)    Time  4    Period  Weeks    Status  New    Target Date  12/10/18        PT Long Term Goals - 11/12/18 1210      PT LONG TERM GOAL #1   Title  Be independent with a long-term home exercise program for self-management of symptoms    Time  8    Period  Weeks    Status  New    Target Date  01/07/19      PT LONG TERM GOAL #2    Title  Demonstrate improved FOTO score by 10 units to demonstrate improvement in overall condition and self-reported functional ability.     Baseline  baseline to be assessed on visit 2    Time  8    Period  Weeks    Status  New    Target Date  01/07/19      PT LONG TERM GOAL #3   Title  Improve right knee PROM to equal or greater than 125 degrees flexion to allow patient to complete valued activities with less difficulty.     Baseline  98 degrees (11/11/2018)    Time  8    Period  Weeks    Status  New    Target Date  01/07/19      PT LONG TERM GOAL #4   Title  Improve B hip  strength to equal or greater than 4+/5 and bilateral knee strength to 5/5 for improved ability to complete valued functional tasks such as walking, climbing/descending stairs, community activities.    Baseline  R knee flex and ext both MMT = 4/5 (11/11/2017).    Time  8    Period  Weeks    Status  New    Target Date  01/07/19      PT LONG TERM GOAL #5   Title  Complete community, work and/or recreational activities without limitation due to current condition.     Time  8    Period  Weeks    Status  New    Target Date  01/07/19            Plan - 11/30/18 1418    Clinical Impression Statement  Patient has attended 4 physical treatment sessions this episode of care. She is making progress towards goals at this point but has demonstrated difficulty tolerating activity and canceled her last visit due to overall increased pain. She states that her last PT appointment did not worsen the pain but her quad keeps "grabbing" and making her feel unstable. Her exercises were kept light today to make treatment more tolerable. Pt did well with tolerating exercises today and will be re-assessed for tolerance later in the week when she comes back for her next session. She was provided education for appropriate AD to improve her confidence and mobility at this point. Patient is a 73 y.o. female referred to outpatient physical  therapy with a medical diagnosis of s/p R TKA performed 10/12/2018 who presents with signs and symptoms consistent with right knee pain, stiffness, swelling, weakness, and dysfunction s/p R TKA leading to decreased overall activity tolerance, physical conditioning and strength, disrupted gait pattern, and decreased ability to perform usual ADLs, IADLs, household and community mobility, usual social and community participation. Patient will benefit from further physical therapy intervention to address her remaining impairments and functional deficits, work towards stated goals and return to PLOF.    Rehab Potential  Good  Clinical Impairments Affecting Rehab Potential  (+) motivation, positive outlook, good response to PT so far; (-) multiple comorbidities, risk of infection, lives alone    PT Frequency  2x / week    PT Duration  8 weeks    PT Treatment/Interventions  ADLs/Self Care Home Management;Cryotherapy;Moist Heat;Gait training;Functional mobility training;Stair training;Therapeutic activities;Therapeutic exercise;Balance training;Neuromuscular re-education;Patient/family education;Manual techniques;Compression bandaging;Scar mobilization;Passive range of motion;Dry needling;Joint Manipulations;Other (comment)   joint mobilizations grades I-V   PT Next Visit Plan  Continue progressive strengthening and mobility as tolerated.     PT Home Exercise Plan  Medbridge Access Code: [email or web address]     Consulted and Agree with Plan of Care  Patient       Patient will benefit from skilled therapeutic intervention in order to improve the following deficits and impairments:  Abnormal gait, Decreased activity tolerance, Decreased endurance, Decreased range of motion, Decreased skin integrity, Decreased strength, Hypomobility, Impaired perceived functional ability, Impaired sensation, Improper body mechanics, Pain, Decreased balance, Decreased mobility, Decreased scar mobility, Difficulty walking, Increased edema,  Increased muscle spasms, Impaired flexibility, Obesity, Postural dysfunction  Visit Diagnosis: Right knee pain, unspecified chronicity  Stiffness of right knee, not elsewhere classified  Muscle weakness (generalized)  Difficulty in walking, not elsewhere classified     Problem List Patient Active Problem List   Diagnosis Date Noted  . Primary osteoarthritis of right knee 10/12/2018  . Obstructive sleep apnea treated with continuous positive airway pressure (CPAP) 01/28/2018  . OSA (obstructive sleep apnea) 08/27/2017  . Excessive daytime sleepiness 08/27/2017  . Paronychia of toe 06/16/2016  . Chronic sphenoidal sinusitis 08/13/2015  . Migraine 11/22/2014  . Sleep apnea 11/22/2014  . Abnormal MRI of head 07/04/2013  . Headache(784.0) 03/29/2013  . Peripheral neuropathy   . Major depressive disorder   . Abnormal mammogram 06/10/2012    Nancy Nordmann, PT, DPT 11/30/2018, 2:20 PM  [facility] [address] [place], [address] Phone: [phone]   Fax:  [phone]  Name: BRANDII LAKEY MRN: [id number] Date of Birth: 21-Oct-1946

## 2018-12-02 ENCOUNTER — Ambulatory Visit: Payer: Medicare Other | Admitting: Physical Therapy

## 2018-12-02 ENCOUNTER — Telehealth: Payer: Self-pay | Admitting: Physical Therapy

## 2018-12-02 NOTE — Telephone Encounter (Signed)
Called patient when she did not show up for her appointment today. Patient appeared to have forgotten but did not want to reschedule. Stated she can hardly walk and feels too much pain to come in and stated that it takes a few days to feel better after PT. I suggested she speak to front office about spacing her appts. further apart Mon and Thurs but pt said she would do this when she comes in. I suggested that she may feel better if she comes to PT where we can move it more and that being still at home can add to the pain, but she declined. Stated she may contact her doctor about the "grabbing." Confirmed she is planning to come to her next PT visit on Tuesday.

## 2018-12-07 ENCOUNTER — Encounter: Payer: Self-pay | Admitting: Physical Therapy

## 2018-12-07 ENCOUNTER — Ambulatory Visit: Payer: Medicare Other | Admitting: Physical Therapy

## 2018-12-07 DIAGNOSIS — R262 Difficulty in walking, not elsewhere classified: Secondary | ICD-10-CM

## 2018-12-07 DIAGNOSIS — M25661 Stiffness of right knee, not elsewhere classified: Secondary | ICD-10-CM

## 2018-12-07 DIAGNOSIS — M25561 Pain in right knee: Secondary | ICD-10-CM | POA: Diagnosis not present

## 2018-12-07 DIAGNOSIS — M6281 Muscle weakness (generalized): Secondary | ICD-10-CM

## 2018-12-07 NOTE — Therapy (Signed)
[facility] [address] [place], [address] Phone: [phone]   Fax:  [phone]  Physical Therapy Treatment  Patient Details  Name: Pamela Hendricks MRN: [id number] Date of Birth: 03/09/1946 Referring Provider (PT): Melrose Nakayama, MD   Encounter Date: 12/07/2018  PT End of Session - 12/07/18 1126    Visit Number  5    Number of Visits  16    Date for PT Re-Evaluation  01/07/19    Authorization Type  Medicare reporting period from 11/11/2018    Authorization Time Period  Current Cert period: 0/04/3015 - 01/07/2018 (last PN: IE 11/11/2018)    Authorization - Visit Number  5    Authorization - Number of Visits  10    PT Start Time  0955    PT Stop Time  1035    PT Time Calculation (min)  40 min    Activity Tolerance  Patient tolerated treatment well;Patient limited by pain    Behavior During Therapy  [facility] for tasks assessed/performed       Past Medical History:  Diagnosis Date  . Arthritis   . Carotid artery disease ([place])    a. duplex 07/2017 - 1-39% stenosis bilaterally.  . Environmental and seasonal allergies   . GERD (gastroesophageal reflux disease)   . Hyperlipidemia   . Hypertension   . Migraines    sinus headaches, no longer having migraines  . OSA (obstructive sleep apnea)    CPAP  . Peripheral neuropathy   . Peripheral vascular disease ([place])    carotid blockage - is followed by Dr. Marlou Porch  . Snores   . Wears glasses     Past Surgical History:  Procedure Laterality Date  . ABDOMINAL HYSTERECTOMY  1970   Total HYST  . BREAST BIOPSY Bilateral 2001   benign  . BREAST EXCISIONAL BIOPSY Right 2013   sclerosing lesion  . BREAST LUMPECTOMY WITH RADIOACTIVE SEED LOCALIZATION Left 08/31/2018   Procedure: BREAST LUMPECTOMY WITH RADIOACTIVE SEED LOCALIZATION;  Surgeon: Fanny Skates, MD;  Location: [facility];  Service: General;  Laterality: Left;  . COLONOSCOPY    . JOINT REPLACEMENT Left 2001    Left Knee Replacement  . KNEE ARTHROSCOPY Left    lt  . NASAL SINUS SURGERY    . TOTAL KNEE ARTHROPLASTY Right 10/12/2018   Procedure: TOTAL KNEE ARTHROPLASTY;  Surgeon: Melrose Nakayama, MD;  Location: [facility];  Service: Orthopedics;  Laterality: Right;  . WISDOM TOOTH EXTRACTION      There were no vitals filed for this visit.  Subjective Assessment - 12/07/18 1001    Subjective  Patient reports her pain is low this morning, probably 3-4/10. She reports she did not go and get a cane after last session and is continuing to have difficulty with disabling pain. She also reports continued "grabbing" of her thigh muscles. She missed her last appointment due to first forgetting the time, but declined to reschedule due to pain.      Pertinent History  Patient is a 73 y.o. female who presents to outpatient physical therapy with a referral for medical diagnosis s/p R TKR performed 10/12/2018. This patient's chief complaints consist of pain, stiffness, swelling, weakness, reduced activity tolerance, and difficulty walking, leading to the following functional deficits difficulty with usual ADLs, IADLs, household and community mobility, sleeping, and usual community and volunteer activities.Relevant past medical history and comorbidities include history of left TKA, peripheral neuropathy, sleep apnea, headaches, carotid artery stenosis 39%  bilaterally in 07/2017, history of left hammer toe.     Limitations  Sitting;Standing;House hold activities;Walking;Lifting    How long can you sit comfortably?  chair < 15 min    How long can you stand comfortably?  < 5 min    How long can you walk comfortably?  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    Currently in Pain?  Yes    Pain Score  4     Pain Location  Knee    Pain Orientation  Anterior;Right    Pain Descriptors / Indicators  Other (Comment)   stretching pain   Pain Type   Surgical pain    Pain Onset  1 to 4 weeks ago           OBJECTIVE:  R Knee AAROM:   - flexion: 110 ERP - extension: lacking ~ 10 degrees    TREATMENT: Therapeutic exercise:to centralize symptoms and improve ROM and strength required for successful completion of functional activities.  -Recumbent Bikeno added resistance at closest tolerable seat position to improve knee flexion. Seat position 6. RPMlow 65s.For improved lower extremity ROM, muscular endurance, and activity tolerance; and to induce the analgesic effect of aerobic exercise, stimulate joint nutrition, and prepare body structures and systems for following interventions. x60min during subjective exam. - supine short arc quad 3x 10 each side, very painful at first. Cuing for strong quad contraction. To improve quad strength for functional mobility.  - seated long arc quad.  cuing for strong quad contraction.To improve quad strength for functional mobility and improved activity tolerance. 3x10 each side with 2 second hold in extension at 7.5# ankle weight.  - R standing TKE against wall using ball against wall, SBA for safety. To improve terminal knee extension. 4 second hold. X 2 min + time to learn activity and for transitions.  - sit to stand from low plinth with no UE support. To improve hip and knee extension strength for improved mobility. Cuing for strong hip extension. 3x10. - R step up to 6 inch step with unilateral UE hold. To improve knee and hip strength for climbing stairs. Cuing for balance and hip and knee extension. x10 each side with SBA.     HOME EXERCISE PROGRAM Access Code: [email or web address]  URL: [email or web address]  Date: 11/16/2018  Prepared by: Rosita Kea   Exercises   Active Straight Leg Raise with Quad Set - 10-15 reps - 5 second hold - 1-3 Sets - 1x daily - 7x weekly   Supine Heel Slide with Strap - 10-15 reps - 5 second hold - 1-3 Sets - 1x daily - 7x weekly   Supine Bridge -  10-15 reps - 1 second hold - 1-3 Sets - 1x daily - 7x weekly   Clamshell - 10-15 reps - 1 second hold - 1-3 Sets - 1x daily - 7x weekly   Sit to Stand without Arm Support - 10-15 reps - 1 second hold - 3 Sets - 1x daily - 7x weekly   Patient response to treatment:  Pt tolerated treatment better than past visits. Focus on improving joint motion was lowered and more focus was placed on quad and hip strengthening due to pt report of excessive pain following treatment sessions and "grabbing" sensation in muscle that is likely related to muscle weakness. Pt was able to complete all exerciseswith minimal difficulty due to pain. She indicated her  pain level was acceptable throughout exercises when asked. Pt's incision site appears to be healing well without signs of infection. She continues to demonstrate altered gait pattern with antalgic gait noted favoring right LE. Pt required cuing for proper technique and to facilitate improved neuromuscular control, strength, range of motion, and functional ability.Feeling good at end of session. Looser.      PT Education - 12/07/18 1007    Education Details  Exercise purpose/form. Self management techniques. Education on diagnosis, prognosis, POC, anatomy and physiology of current condition    Person(s) Educated  Patient    Methods  Explanation;Demonstration;Tactile cues;Verbal cues    Comprehension  Verbalized understanding;Returned demonstration       PT Short Term Goals - 11/12/18 1201      PT SHORT TERM GOAL #1   Title  Be independent with initial home exercise program for self-management of symptoms.    Baseline  Initial OP HEP to be provided visit 2.     Time  2    Period  Weeks    Status  New    Target Date  11/26/18      PT SHORT TERM GOAL #2   Title  Patient will be able to ascend and descend four 6-inch steps with step over step gait pattern and no UE support to improve functional mobility.     Baseline  see objective exam from  11/11/2018    Time  4    Period  Weeks    Status  New    Target Date  12/10/18      PT SHORT TERM GOAL #3   Title  Patient will be able to return to driving without limitations from right knee surgery to improve independence with IADLs.     Baseline  Not yet driving (11/17/4079)    Time  4    Period  Weeks    Status  New    Target Date  12/10/18      PT SHORT TERM GOAL #4   Title  Improve right knee AROM to equal or greater than 0 degrees extension and 110 flexion to allow patient to complete valued activities with less difficulty.     Baseline  see objective exam (11/11/2018)    Time  4    Period  Weeks    Status  New    Target Date  12/10/18      PT SHORT TERM GOAL #5   Title  Improve right hip abduction strength to 3+/5 for improved stability in SLS to allow patient to complete valued functional tasks such as walking, climbing/descending stairs, and getting in and out of care safely.    Baseline  MMT = 2/5 (11/11/2018)    Time  4    Period  Weeks    Status  New    Target Date  12/10/18        PT Long Term Goals - 11/12/18 1210      PT LONG TERM GOAL #1   Title  Be independent with a long-term home exercise program for self-management of symptoms    Time  8    Period  Weeks    Status  New    Target Date  01/07/19      PT LONG TERM GOAL #2   Title  Demonstrate improved FOTO score by 10 units to demonstrate improvement in overall condition and self-reported functional ability.     Baseline  baseline to be assessed on visit 2  Time  8    Period  Weeks    Status  New    Target Date  01/07/19      PT LONG TERM GOAL #3   Title  Improve right knee PROM to equal or greater than 125 degrees flexion to allow patient to complete valued activities with less difficulty.     Baseline  98 degrees (11/11/2018)    Time  8    Period  Weeks    Status  New    Target Date  01/07/19      PT LONG TERM GOAL #4   Title  Improve B hip  strength to equal or greater than 4+/5 and bilateral  knee strength to 5/5 for improved ability to complete valued functional tasks such as walking, climbing/descending stairs, community activities.    Baseline  R knee flex and ext both MMT = 4/5 (11/11/2017).    Time  8    Period  Weeks    Status  New    Target Date  01/07/19      PT LONG TERM GOAL #5   Title  Complete community, work and/or recreational activities without limitation due to current condition.     Time  8    Period  Weeks    Status  New    Target Date  01/07/19            Plan - 12/07/18 1129    Clinical Impression Statement  Patient has attended 5 physical treatment sessions this episode of care. She is making progress towards goals at this point but has demonstrated difficulty tolerating activity and continues to miss her second PT appointment of the week due to increased pain despite modifications of activities in clinic to make it more comfortable. Today, treatment was altered to focus more on strengthening and less on mobility to decrease "grabbing" that pt reports in quad and possibly reduce pain response with less end range stretching.   Pt did well with tolerating exercises today and will be re-assessed for tolerance later in the week when she comes back for her next session. t. Patient is a 73 y.o. female referred to outpatient physical therapy with a medical diagnosis of s/p R TKA performed 10/12/2018 who presents with signs and symptoms consistent with right knee pain, stiffness, swelling, weakness, and dysfunction s/p R TKA leading to decreased overall activity tolerance, physical conditioning and strength, disrupted gait pattern, and decreased ability to perform usual ADLs, IADLs, household and community mobility, usual social and community participation. Patient will benefit from further physical therapy intervention to address her remaining impairments and functional deficits, work towards stated goals and return to PLOF.    Rehab Potential  Good    Clinical  Impairments Affecting Rehab Potential  (+) motivation, positive outlook, good response to PT so far; (-) multiple comorbidities, risk of infection, lives alone    PT Frequency  2x / week    PT Duration  8 weeks    PT Treatment/Interventions  ADLs/Self Care Home Management;Cryotherapy;Moist Heat;Gait training;Functional mobility training;Stair training;Therapeutic activities;Therapeutic exercise;Balance training;Neuromuscular re-education;Patient/family education;Manual techniques;Compression bandaging;Scar mobilization;Passive range of motion;Dry needling;Joint Manipulations;Other (comment)   joint mobilizations grades I-V   PT Next Visit Plan  Continue progressive strengthening and mobility as tolerated.     PT Home Exercise Plan  Medbridge Access Code: [email or web address]     Consulted and Agree with Plan of Care  Patient       Patient will benefit from skilled therapeutic intervention in order  to improve the following deficits and impairments:  Abnormal gait, Decreased activity tolerance, Decreased endurance, Decreased range of motion, Decreased skin integrity, Decreased strength, Hypomobility, Impaired perceived functional ability, Impaired sensation, Improper body mechanics, Pain, Decreased balance, Decreased mobility, Decreased scar mobility, Difficulty walking, Increased edema, Increased muscle spasms, Impaired flexibility, Obesity, Postural dysfunction  Visit Diagnosis: Right knee pain, unspecified chronicity  Stiffness of right knee, not elsewhere classified  Muscle weakness (generalized)  Difficulty in walking, not elsewhere classified     Problem List Patient Active Problem List   Diagnosis Date Noted  . Primary osteoarthritis of right knee 10/12/2018  . Obstructive sleep apnea treated with continuous positive airway pressure (CPAP) 01/28/2018  . OSA (obstructive sleep apnea) 08/27/2017  . Excessive daytime sleepiness 08/27/2017  . Paronychia of toe 06/16/2016  . Chronic sphenoidal  sinusitis 08/13/2015  . Migraine 11/22/2014  . Sleep apnea 11/22/2014  . Abnormal MRI of head 07/04/2013  . Headache(784.0) 03/29/2013  . Peripheral neuropathy   . Major depressive disorder   . Abnormal mammogram 06/10/2012    Nancy Nordmann, PT, DPT 12/07/2018, 11:30 AM  [facility] [address] [place], [address] Phone: [phone]   Fax:  [phone]  Name: Pamela Hendricks MRN: [id number] Date of Birth: Jul 20, 1946

## 2018-12-09 ENCOUNTER — Encounter: Payer: Self-pay | Admitting: Physical Therapy

## 2018-12-09 ENCOUNTER — Ambulatory Visit: Payer: Medicare Other | Admitting: Physical Therapy

## 2018-12-09 DIAGNOSIS — R262 Difficulty in walking, not elsewhere classified: Secondary | ICD-10-CM

## 2018-12-09 DIAGNOSIS — M25561 Pain in right knee: Secondary | ICD-10-CM | POA: Diagnosis not present

## 2018-12-09 DIAGNOSIS — M25661 Stiffness of right knee, not elsewhere classified: Secondary | ICD-10-CM

## 2018-12-09 DIAGNOSIS — M6281 Muscle weakness (generalized): Secondary | ICD-10-CM

## 2018-12-09 NOTE — Therapy (Signed)
[facility] [address], [place], [address] Phone: [phone]   Fax:  [phone]  Physical Therapy Treatment  Patient Details  Name: Pamela Hendricks MRN: [id number] Date of Birth: [birth year] Referring Provider (PT): Pamela Nakayama, MD   Encounter Date: 12/09/2018  PT End of Session - 12/09/18 1005    Visit Number  6    Number of Visits  16    Date for PT Re-Evaluation  01/07/19    Authorization Type  Medicare reporting period from 11/11/2018    Authorization Time Period  Current Cert period: 0/05/3709 - 01/07/2018 (last PN: IE 11/11/2018)    Authorization - Visit Number  6    Authorization - Number of Visits  10    PT Start Time  0950    PT Stop Time  1030    PT Time Calculation (min)  40 min    Activity Tolerance  Patient tolerated treatment well;Patient limited by pain    Behavior During Therapy  [facility] for tasks assessed/performed       Past Medical History:  Diagnosis Date  . Arthritis   . Carotid artery disease ([place])    a. duplex 07/2017 - 1-39% stenosis bilaterally.  . Environmental and seasonal allergies   . GERD (gastroesophageal reflux disease)   . Hyperlipidemia   . Hypertension   . Migraines    sinus headaches, no longer having migraines  . OSA (obstructive sleep apnea)    CPAP  . Peripheral neuropathy   . Peripheral vascular disease ([place])    carotid blockage - is followed by Dr. Marlou Hendricks  . Snores   . Wears glasses     Past Surgical History:  Procedure Laterality Date  . ABDOMINAL HYSTERECTOMY  1970   Total HYST  . BREAST BIOPSY Bilateral 2001   benign  . BREAST EXCISIONAL BIOPSY Right 2013   sclerosing lesion  . BREAST LUMPECTOMY WITH RADIOACTIVE SEED LOCALIZATION Left 08/31/2018   Procedure: BREAST LUMPECTOMY WITH RADIOACTIVE SEED LOCALIZATION;  Surgeon: Pamela Skates, MD;  Location: [place];  Service: General;  Laterality: Left;  . COLONOSCOPY    . JOINT REPLACEMENT Left 2001   Left Knee Replacement  . KNEE ARTHROSCOPY Left    lt  . NASAL SINUS SURGERY    . TOTAL KNEE ARTHROPLASTY Right 10/12/2018   Procedure: TOTAL KNEE ARTHROPLASTY;  Surgeon: Pamela Nakayama, MD;  Location: [facility];  Service: Orthopedics;  Laterality: Right;  . WISDOM TOOTH EXTRACTION      There were no vitals filed for this visit.  Subjective Assessment - 12/09/18 1001    Subjective  Patient reports 5/10 pain upon arrival. She reports she did 10 sit <> stands yesterday and tolerated it okay. She reports she was sore but not intolerable. She plans to rake leaves out of her yard on Monday. She continues to report "grabbing."    Pertinent History  Patient is a 73 y.o. female who presents to outpatient physical therapy with a referral for medical diagnosis s/p R TKR performed 10/12/2018. This patient's chief complaints consist of pain, stiffness, swelling, weakness, reduced activity tolerance, and difficulty walking, leading to the following functional deficits difficulty with usual ADLs, IADLs, household and community mobility, sleeping, and usual community and volunteer activities.Relevant past medical history and comorbidities include history of left TKA, peripheral neuropathy, sleep apnea, headaches, carotid artery stenosis 39% bilaterally in 07/2017, history of left hammer toe.     Limitations  Sitting;Standing;House hold activities;Walking;Lifting  How long can you sit comfortably?  chair < 15 min    How long can you stand comfortably?  < 5 min    How long can you walk comfortably?  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    Currently in Pain?  Yes    Pain Score  5     Pain Location  Knee    Pain Orientation  Right    Pain Descriptors / Indicators  --   "stretchy"   Pain Type  Surgical pain    Pain Onset  1 to 4 weeks ago    Pain Frequency  Constant           TREATMENT: Therapeutic exercise:to  centralize symptoms and improve ROM and strength required for successful completion of functional activities.  -Recumbent Bikeno added resistance at closest tolerable seat position to improve knee flexion. Seat position6. RPMlow 95s.For improved lower extremity ROM, muscular endurance, and activity tolerance; and to induce the analgesic effect of aerobic exercise, stimulate joint nutrition, and prepare body structures and systems for following interventions. x2mn during subjective exam. - supine short arc quad3x 10 each side. Cuing for strong quad contraction.To improve quad strength for functional mobility.10# ankle weight. - seated long arc quad.  cuing for strong quad contraction.To improve quad strength for functional mobility and improved activity tolerance.3x10 each side with 2 second hold in extension at 10# ankle weight.  - sit to stand from 18" chair with no UE support. To improve hip and knee extension strength for improved mobility. Cuing for strong hip extension. 3x10. - standing hamtring curl with narrow BUE support on PVC pole positioned in front of body. 5# ankle weight x10 each side. SBA for safety.  - standing hip abduction with narrow BUE support on PVC pole positioned in front of body. 10# ankle weight x10 each side. SBA for safety.  - step up to 6 inch step with unilateral UE hold. To improve knee and hip strength for climbing stairs. Cuing for balance and hip and knee extension. 2x10 each side with SBA.  - ascent and descent of 4 steps with BUE support step over step.    HOME EXERCISE PROGRAM Access Code: [email or web address]  URL: [email or web address]  Date: 11/16/2018  Prepared by: SRosita Kea  Exercises   Active Straight Leg Raise with Quad Set - 10-15 reps - 5 second hold - 1-3 Sets - 1x daily - 7x weekly   Supine Heel Slide with Strap - 10-15 reps - 5 second hold - 1-3 Sets - 1x daily - 7x weekly   Supine Bridge - 10-15 reps - 1 second hold - 1-3  Sets - 1x daily - 7x weekly   Clamshell - 10-15 reps - 1 second hold - 1-3 Sets - 1x daily - 7x weekly   Sit to Stand without Arm Support - 10-15 reps - 1 second hold - 3 Sets - 1x daily - 7x weekly   Patient response to treatment:  Pt tolerated treatment well. Focus  on quad and hip strengthening was continued due to pt report of excessive pain following treatment sessions with more aggressive manual. Pt continues to report  "grabbing" sensation in muscle that is likely related to muscle weakness but she is improving in her activity tolerance and was able to complete more strengthening exercises today. Pt was able to complete all exerciseswith minimal difficulty due to pain.  She indicated her pain level was acceptable throughout exercises when asked.Pt's incision site appears to be healing well without signs of infection. She continues to demonstrate altered gait pattern with antalgic gait noted favoring right LE. Pt required cuing for proper technique and to facilitate improved neuromuscular control, strength, range of motion, and functional ability.Feeling good at end of session. Reports feeling looser.      PT Education - 12/09/18 1005    Education Details  Exercise purpose/form. Self management techniques. Education on diagnosis, prognosis, POC, anatomy and physiology of current condition    Person(s) Educated  Patient    Methods  Explanation;Demonstration;Tactile cues;Verbal cues    Comprehension  Verbalized understanding;Returned demonstration       PT Short Term Goals - 12/09/18 1711      PT SHORT TERM GOAL #1   Title  Be independent with initial home exercise program for self-management of symptoms.    Baseline  Initial OP HEP to be provided visit 2.     Time  2    Period  Weeks    Status  Achieved    Target Date  11/26/18      PT SHORT TERM GOAL #2   Title  Patient will be able to ascend and descend four 6-inch steps with step over step gait pattern and no UE support  to improve functional mobility.     Baseline  see objective exam from 11/11/2018    Time  2    Period  Weeks    Status  Partially Met    Target Date  12/23/18      PT SHORT TERM GOAL #3   Title  Patient will be able to return to driving without limitations from right knee surgery to improve independence with IADLs.     Baseline  Not yet driving (02/13/6255);     Time  4    Period  Weeks    Status  Achieved    Target Date  12/10/18      PT SHORT TERM GOAL #4   Title  Improve right knee AROM to equal or greater than 0 degrees extension and 110 flexion to allow patient to complete valued activities with less difficulty.     Baseline  see objective exam (11/11/2018)    Time  4    Period  Weeks    Status  Partially Met    Target Date  01/06/19      PT SHORT TERM GOAL #5   Title  Improve right hip abduction strength to 3+/5 for improved stability in SLS to allow patient to complete valued functional tasks such as walking, climbing/descending stairs, and getting in and out of care safely.    Baseline  MMT = 2/5 (11/11/2018)    Time  4    Period  Weeks    Status  New        PT Long Term Goals - 11/12/18 1210      PT LONG TERM GOAL #1   Title  Be independent with a long-term home exercise program for self-management of symptoms    Time  8    Period  Weeks    Status  New    Target Date  01/07/19      PT LONG TERM GOAL #2   Title  Demonstrate improved FOTO score by 10 units to demonstrate improvement in overall condition and self-reported functional ability.     Baseline  baseline to be assessed on visit 2  Time  8    Period  Weeks    Status  New    Target Date  01/07/19      PT LONG TERM GOAL #3   Title  Improve right knee PROM to equal or greater than 125 degrees flexion to allow patient to complete valued activities with less difficulty.     Baseline  98 degrees (11/11/2018)    Time  8    Period  Weeks    Status  New    Target Date  01/07/19      PT LONG TERM GOAL #4    Title  Improve B hip  strength to equal or greater than 4+/5 and bilateral knee strength to 5/5 for improved ability to complete valued functional tasks such as walking, climbing/descending stairs, community activities.    Baseline  R knee flex and ext both MMT = 4/5 (11/11/2017).    Time  8    Period  Weeks    Status  New    Target Date  01/07/19      PT LONG TERM GOAL #5   Title  Complete community, work and/or recreational activities without limitation due to current condition.     Time  8    Period  Weeks    Status  New    Target Date  01/07/19            Plan - 12/09/18 1711    Clinical Impression Statement  Patient has attended 6 physical treatment sessions this episode of care. She is making progress towards goals at this point and has shown improved tolerance to physical therapy intervention and has been able to tolerate two sessions this week.. Today, treatment continued to focus more on strengthening to decrease "grabbing" that pt reports in quad. Pt did well with tolerating exercises today. Patient is a 73 y.o. female referred to outpatient physical therapy with a medical diagnosis of s/p R TKA performed 10/12/2018 who presents with signs and symptoms consistent with right knee pain, stiffness, swelling, weakness, and dysfunction s/p R TKA leading to decreased overall activity tolerance, physical conditioning and strength, disrupted gait pattern, and decreased ability to perform usual ADLs, IADLs, household and community mobility, usual social and community participation. Patient will benefit from further physical therapy intervention to address her remaining impairments and functional deficits, work towards stated goals and return to PLOF.    Rehab Potential  Good    Clinical Impairments Affecting Rehab Potential  (+) motivation, positive outlook, good response to PT so far; (-) multiple comorbidities, risk of infection, lives alone    PT Frequency  2x / week    PT Duration  8  weeks    PT Treatment/Interventions  ADLs/Self Care Home Management;Cryotherapy;Moist Heat;Gait training;Functional mobility training;Stair training;Therapeutic activities;Therapeutic exercise;Balance training;Neuromuscular re-education;Patient/family education;Manual techniques;Compression bandaging;Scar mobilization;Passive range of motion;Dry needling;Joint Manipulations;Other (comment)   joint mobilizations grades I-V   PT Next Visit Plan  Continue progressive strengthening and mobility as tolerated.     PT Home Exercise Plan  Medbridge Access Code: [email or web address]     Consulted and Agree with Plan of Care  Patient       Patient will benefit from skilled therapeutic intervention in order to improve the following deficits and impairments:  Abnormal gait, Decreased activity tolerance, Decreased endurance, Decreased range of motion, Decreased skin integrity, Decreased strength, Hypomobility, Impaired perceived functional ability, Impaired sensation, Improper body mechanics, Pain, Decreased balance, Decreased mobility, Decreased scar mobility, Difficulty walking, Increased edema, Increased muscle spasms,  Impaired flexibility, Obesity, Postural dysfunction  Visit Diagnosis: Right knee pain, unspecified chronicity  Stiffness of right knee, not elsewhere classified  Muscle weakness (generalized)  Difficulty in walking, not elsewhere classified     Problem List Patient Active Problem List   Diagnosis Date Noted  . Primary osteoarthritis of right knee 10/12/2018  . Obstructive sleep apnea treated with continuous positive airway pressure (CPAP) 01/28/2018  . OSA (obstructive sleep apnea) 08/27/2017  . Excessive daytime sleepiness 08/27/2017  . Paronychia of toe 06/16/2016  . Chronic sphenoidal sinusitis 08/13/2015  . Migraine 11/22/2014  . Sleep apnea 11/22/2014  . Abnormal MRI of head 07/04/2013  . Headache(784.0) 03/29/2013  . Peripheral neuropathy   . Major depressive disorder   .  Abnormal mammogram 06/10/2012    Nancy Nordmann, PT, DPT 12/09/2018, 5:12 PM  [facility] [address], [place], [address] Phone: [phone]   Fax:  [phone]  Name: YAZMINA PAREJA MRN: [id number] Date of Birth: Nov 14, 1945

## 2018-12-13 ENCOUNTER — Encounter: Payer: Medicare Other | Admitting: Physical Therapy

## 2018-12-14 ENCOUNTER — Ambulatory Visit: Payer: Medicare Other | Admitting: Physical Therapy

## 2018-12-15 ENCOUNTER — Encounter: Payer: Self-pay | Admitting: Physical Therapy

## 2018-12-15 ENCOUNTER — Ambulatory Visit: Payer: Medicare Other | Attending: Orthopaedic Surgery | Admitting: Physical Therapy

## 2018-12-15 VITALS — BP 98/48 | HR 82

## 2018-12-15 DIAGNOSIS — M6281 Muscle weakness (generalized): Secondary | ICD-10-CM

## 2018-12-15 DIAGNOSIS — M25661 Stiffness of right knee, not elsewhere classified: Secondary | ICD-10-CM

## 2018-12-15 DIAGNOSIS — M25561 Pain in right knee: Secondary | ICD-10-CM | POA: Diagnosis present

## 2018-12-15 DIAGNOSIS — R262 Difficulty in walking, not elsewhere classified: Secondary | ICD-10-CM | POA: Diagnosis present

## 2018-12-15 NOTE — Therapy (Signed)
[facility] [address] [place], [address] Phone: [phone]   Fax:  [phone]  Physical Therapy Treatment  Patient Details  Name: Pamela Hendricks MRN: [id number] Date of Birth: [birth year] Referring Provider (PT): Melrose Nakayama, MD   Encounter Date: 12/15/2018  PT End of Session - 12/15/18 1512    Visit Number  7    Number of Visits  16    Date for PT Re-Evaluation  01/07/19    Authorization Type  Medicare reporting period from 11/11/2018    Authorization Time Period  Current Cert period: 11/17/8414 - 01/07/2018 (last PN: IE 11/11/2018)    Authorization - Visit Number  7    Authorization - Number of Visits  10    PT Start Time  1350    PT Stop Time  1440    PT Time Calculation (min)  50 min    Activity Tolerance  Patient tolerated treatment well;Patient limited by pain;Patient limited by fatigue    Behavior During Therapy  [facility] for tasks assessed/performed       Past Medical History:  Diagnosis Date  . Arthritis   . Carotid artery disease ([place])    a. duplex 07/2017 - 1-39% stenosis bilaterally.  . Environmental and seasonal allergies   . GERD (gastroesophageal reflux disease)   . Hyperlipidemia   . Hypertension   . Migraines    sinus headaches, no longer having migraines  . OSA (obstructive sleep apnea)    CPAP  . Peripheral neuropathy   . Peripheral vascular disease ([place])    carotid blockage - is followed by Dr. Marlou Porch  . Snores   . Wears glasses     Past Surgical History:  Procedure Laterality Date  . ABDOMINAL HYSTERECTOMY  1970   Total HYST  . BREAST BIOPSY Bilateral 2001   benign  . BREAST EXCISIONAL BIOPSY Right 2013   sclerosing lesion  . BREAST LUMPECTOMY WITH RADIOACTIVE SEED LOCALIZATION Left 08/31/2018   Procedure: BREAST LUMPECTOMY WITH RADIOACTIVE SEED LOCALIZATION;  Surgeon: Fanny Skates, MD;  Location: [facility];  Service: General;  Laterality: Left;  . COLONOSCOPY    .  JOINT REPLACEMENT Left 2001   Left Knee Replacement  . KNEE ARTHROSCOPY Left    lt  . NASAL SINUS SURGERY    . TOTAL KNEE ARTHROPLASTY Right 10/12/2018   Procedure: TOTAL KNEE ARTHROPLASTY;  Surgeon: Melrose Nakayama, MD;  Location: [facility];  Service: Orthopedics;  Laterality: Right;  . WISDOM TOOTH EXTRACTION      Vitals:   12/15/18 1433 12/15/18 1443  BP: (!) 90/41 (!) 98/48  Pulse: 82   SpO2: 99%     Subjective Assessment - 12/15/18 1354    Subjective  Patient reports 8/10 pain when she arrived. She states she did her 30 sit <> stands until Sunday when she was very busy up and walking at church, then monday she was in [place] all day, yesterday she was feeling sick but feels better now. She states she saw her referring physician since last physical therapy session and he was very pleased with her progress at this point. He warned her not to fall. she also saw her PCP who said everything looks good and is doing blood work.      Pertinent History  Patient is a 73 y.o. female who presents to outpatient physical therapy with a referral for medical diagnosis s/p R TKR performed 10/12/2018. This patient's chief complaints consist of pain, stiffness, swelling,  weakness, reduced activity tolerance, and difficulty walking, leading to the following functional deficits difficulty with usual ADLs, IADLs, household and community mobility, sleeping, and usual community and volunteer activities.Relevant past medical history and comorbidities include history of left TKA, peripheral neuropathy, sleep apnea, headaches, carotid artery stenosis 39% bilaterally in 07/2017, history of left hammer toe.     Limitations  Sitting;Standing;House hold activities;Walking;Lifting    How long can you sit comfortably?  chair < 15 min    How long can you stand comfortably?  < 5 min    How long can you walk comfortably?  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    Pain Score  8     Pain Location  Knee    Pain Orientation  Right    Pain Onset  1 to 4 weeks ago      OBJECTIVE:  R AAROM knee flexion: 100 degrees.  R AROM knee extension: -15 degrees  TREATMENT: Therapeutic exercise:to centralize symptoms and improve ROM and strength required for successful completion of functional activities.  -Recumbent Bikeno added resistance at closest tolerable seat position to improve knee flexion. Seat position6 x 3 min.  Level 5 x 3 min. For improved lower extremity ROM, muscular endurance, and activity tolerance; and to induce the analgesic effect of aerobic exercise, stimulate joint nutrition, and prepare body structures and systems for following interventions. x6 totalmin during subjective exam - prone quad set lifting knee off table with toes curled under. To improve end range quad contraction and stretch posterior knee. Cuing for end range hold. 5 second holds. X 3 min plus time for transitions.  - seated long arc quad.cuing for strong quad contraction.To improve quad strength for functional mobility and improved activity tolerance.3x10 each side with 2 second hold in extension at 10# ankle weight.  - blood pressure measurements (see above) and education about blood pressure and safety.   Therapeutic activities: for functional strengthening and improved functional activity tolerance. - sit <>stand  from chair, with overhead press of 3kg ball.. To improve transfer ability at home and reinforce ability for HEP. Cuing to complete exercise and stand up tall. X20, x15.   HOME EXERCISE PROGRAM Access Code: [email or web address]  URL: [email or web address]  Date: 11/16/2018  Prepared by: Rosita Kea   Exercises   Active Straight Leg Raise with Quad Set - 10-15 reps - 5 second hold - 1-3 Sets - 1x daily - 7x weekly   Supine Heel Slide with Strap - 10-15 reps - 5 second hold - 1-3 Sets - 1x daily - 7x weekly   Supine Bridge  - 10-15 reps - 1 second hold - 1-3 Sets - 1x daily - 7x weekly   Clamshell - 10-15 reps - 1 second hold - 1-3 Sets - 1x daily - 7x weekly   Sit to Stand without Arm Support - 10-15 reps - 1 second hold - 3 Sets - 1x daily - 7x weekly   Patient response to treatment:  Pt tolerated treatmentfair. Eccentric hamstring activation was completed to improve extension in right knee with improvement noted following. Bike moved forward one level to improve knee flexion. Pt was able to progress sit <> stand exercise. Patient reported feeling dizzy after sitting up from prone exercises so her blood pressure was measured and found to be low. It was re-measured manually a few minutes later and had come up some but was  still low. Provided water and education on importance of sufficient blood pressure to prevent passing out. Pt reported her BP is usually "over 200" if she forgets to take her medication every day, but she denies taking two doses this morning. She stated she felt better after some rest and water and planned to go directly home where she plans to take it again and eat (she had not eaten this morning). Patient did not want to go to urgent care.She continues to demonstrate altered gait pattern with antalgic gait noted favoring right LE. Pt required cuing for proper technique and to facilitate improved neuromuscular control, strength, range of motion, and functional ability.Feeling good at end of session. Reports feeling looser.       PT Education - 12/15/18 1512    Education Details  Exercise purpose/form. Self management techniques. Education on diagnosis, prognosis, POC, anatomy and physiology of current condition. Blood pressure    Person(s) Educated  Patient    Methods  Explanation;Demonstration;Tactile cues;Verbal cues    Comprehension  Returned demonstration;Verbalized understanding       PT Short Term Goals - 12/09/18 1711      PT SHORT TERM GOAL #1   Title  Be independent with  initial home exercise program for self-management of symptoms.    Baseline  Initial OP HEP to be provided visit 2.     Time  2    Period  Weeks    Status  Achieved    Target Date  11/26/18      PT SHORT TERM GOAL #2   Title  Patient will be able to ascend and descend four 6-inch steps with step over step gait pattern and no UE support to improve functional mobility.     Baseline  see objective exam from 11/11/2018    Time  2    Period  Weeks    Status  Partially Met    Target Date  12/23/18      PT SHORT TERM GOAL #3   Title  Patient will be able to return to driving without limitations from right knee surgery to improve independence with IADLs.     Baseline  Not yet driving (0/04/2693);     Time  4    Period  Weeks    Status  Achieved    Target Date  12/10/18      PT SHORT TERM GOAL #4   Title  Improve right knee AROM to equal or greater than 0 degrees extension and 110 flexion to allow patient to complete valued activities with less difficulty.     Baseline  see objective exam (11/11/2018)    Time  4    Period  Weeks    Status  Partially Met    Target Date  01/06/19      PT SHORT TERM GOAL #5   Title  Improve right hip abduction strength to 3+/5 for improved stability in SLS to allow patient to complete valued functional tasks such as walking, climbing/descending stairs, and getting in and out of care safely.    Baseline  MMT = 2/5 (11/11/2018)    Time  4    Period  Weeks    Status  New        PT Long Term Goals - 11/12/18 1210      PT LONG TERM GOAL #1   Title  Be independent with a long-term home exercise program for self-management of symptoms    Time  8  Period  Weeks    Status  New    Target Date  01/07/19      PT LONG TERM GOAL #2   Title  Demonstrate improved FOTO score by 10 units to demonstrate improvement in overall condition and self-reported functional ability.     Baseline  baseline to be assessed on visit 2    Time  8    Period  Weeks    Status  New     Target Date  01/07/19      PT LONG TERM GOAL #3   Title  Improve right knee PROM to equal or greater than 125 degrees flexion to allow patient to complete valued activities with less difficulty.     Baseline  98 degrees (11/11/2018)    Time  8    Period  Weeks    Status  New    Target Date  01/07/19      PT LONG TERM GOAL #4   Title  Improve B hip  strength to equal or greater than 4+/5 and bilateral knee strength to 5/5 for improved ability to complete valued functional tasks such as walking, climbing/descending stairs, community activities.    Baseline  R knee flex and ext both MMT = 4/5 (11/11/2017).    Time  8    Period  Weeks    Status  New    Target Date  01/07/19      PT LONG TERM GOAL #5   Title  Complete community, work and/or recreational activities without limitation due to current condition.     Time  8    Period  Weeks    Status  New    Target Date  01/07/19            Plan - 12/15/18 1514    Clinical Impression Statement  Patient has attended 7 physical treatment sessions this episode of care. She continues to progress towards goals overall but today was limted by low blood pressure that lead to discontinuation of exercise early. She reports her doctor is happy with her progress and she is able to complete progressively more activity overall.  Patient is a 73 y.o. female referred to outpatient physical therapy with a medical diagnosis of s/p R TKA performed 10/12/2018 who presents with signs and symptoms consistent with right knee pain, stiffness, swelling, weakness, and dysfunction s/p R TKA leading to decreased overall activity tolerance, physical conditioning and strength, disrupted gait pattern, and decreased ability to perform usual ADLs, IADLs, household and community mobility, usual social and community participation. Patient will benefit from further physical therapy intervention to address her remaining impairments and functional deficits, work towards stated  goals and return to PLOF.    Rehab Potential  Good    Clinical Impairments Affecting Rehab Potential  (+) motivation, positive outlook, good response to PT so far; (-) multiple comorbidities, risk of infection, lives alone    PT Frequency  2x / week    PT Duration  8 weeks    PT Treatment/Interventions  ADLs/Self Care Home Management;Cryotherapy;Moist Heat;Gait training;Functional mobility training;Stair training;Therapeutic activities;Therapeutic exercise;Balance training;Neuromuscular re-education;Patient/family education;Manual techniques;Compression bandaging;Scar mobilization;Passive range of motion;Dry needling;Joint Manipulations;Other (comment)   joint mobilizations grades I-V   PT Next Visit Plan  Continue progressive strengthening and mobility as tolerated. check blood pressure    PT Home Exercise Plan  Medbridge Access Code: [email or web address]     Consulted and Agree with Plan of Care  Patient       Patient will  benefit from skilled therapeutic intervention in order to improve the following deficits and impairments:  Abnormal gait, Decreased activity tolerance, Decreased endurance, Decreased range of motion, Decreased skin integrity, Decreased strength, Hypomobility, Impaired perceived functional ability, Impaired sensation, Improper body mechanics, Pain, Decreased balance, Decreased mobility, Decreased scar mobility, Difficulty walking, Increased edema, Increased muscle spasms, Impaired flexibility, Obesity, Postural dysfunction  Visit Diagnosis: Right knee pain, unspecified chronicity  Stiffness of right knee, not elsewhere classified  Muscle weakness (generalized)  Difficulty in walking, not elsewhere classified     Problem List Patient Active Problem List   Diagnosis Date Noted  . Primary osteoarthritis of right knee 10/12/2018  . Obstructive sleep apnea treated with continuous positive airway pressure (CPAP) 01/28/2018  . OSA (obstructive sleep apnea) 08/27/2017  . Excessive  daytime sleepiness 08/27/2017  . Paronychia of toe 06/16/2016  . Chronic sphenoidal sinusitis 08/13/2015  . Migraine 11/22/2014  . Sleep apnea 11/22/2014  . Abnormal MRI of head 07/04/2013  . Headache(784.0) 03/29/2013  . Peripheral neuropathy   . Major depressive disorder   . Abnormal mammogram 06/10/2012    Nancy Nordmann, PT, DPT 12/15/2018, 3:15 PM  [facility] [address] [place], [address] Phone: [phone]   Fax:  [phone]  Name: Pamela Hendricks MRN: [id number] Date of Birth: May 30, 1946

## 2018-12-16 ENCOUNTER — Ambulatory Visit: Payer: Medicare Other | Admitting: Physical Therapy

## 2018-12-16 ENCOUNTER — Telehealth: Payer: Self-pay | Admitting: Physical Therapy

## 2018-12-16 NOTE — Telephone Encounter (Signed)
Called patient to check on her since she left a message cancelling this morning's PT appointment and had low blood pressure and was not feeling great when she left her appt yesterday. Spoke to patient and she said she cancelled her blood pressure was too high last night and this morning. She states it was 136/89 mmHg which is quite high for her and she does not feel comfortable coming in to do physical therapy with it that high. She states she just took her blood pressure medication and will be monitoring it throughout the day and will seek further medical evaluation if it is not better by tomorrow. States she also plans to move around and do some walking and sit <> stands to keep her knee from getting too stiff (it feels stiff and sore this morning). I agreed this seemed like a reasonable plan.

## 2018-12-20 ENCOUNTER — Telehealth: Payer: Self-pay | Admitting: Physical Therapy

## 2018-12-20 ENCOUNTER — Ambulatory Visit: Payer: Medicare Other | Admitting: Physical Therapy

## 2018-12-20 NOTE — Telephone Encounter (Signed)
Called pt when she did not show up for her 9am appt today. No answer. Left message letting her know she had missed her appointment, that I was checking on her wellbeing, and that we currently had openings at 10:30 and 1:45 today if she is still able to come. Also let her know her next appt currently scheduled is Thursday  2/13 at 9am. Left call back number [phone]) and requested a call back to let us know she is okay.

## 2018-12-23 ENCOUNTER — Ambulatory Visit: Payer: Medicare Other | Admitting: Physical Therapy

## 2018-12-23 ENCOUNTER — Encounter: Payer: Self-pay | Admitting: Physical Therapy

## 2018-12-23 VITALS — BP 116/48 | HR 67

## 2018-12-23 DIAGNOSIS — M25661 Stiffness of right knee, not elsewhere classified: Secondary | ICD-10-CM

## 2018-12-23 DIAGNOSIS — M25561 Pain in right knee: Secondary | ICD-10-CM

## 2018-12-23 DIAGNOSIS — M6281 Muscle weakness (generalized): Secondary | ICD-10-CM

## 2018-12-23 DIAGNOSIS — R262 Difficulty in walking, not elsewhere classified: Secondary | ICD-10-CM

## 2018-12-23 NOTE — Therapy (Signed)
[facility] [address], [place], [address] Phone: [phone]   Fax:  [phone]  Physical Therapy Treatment  Patient Details  Name: Pamela Hendricks MRN: [id number] Date of Birth: [birth year] Referring Provider (PT): Melrose Nakayama, MD   Encounter Date: 12/23/2018  PT End of Session - 12/23/18 0919    Visit Number  8    Number of Visits  16    Date for PT Re-Evaluation  01/07/19    Authorization Type  Medicare reporting period from 11/11/2018    Authorization Time Period  Current Cert period: 05/16/9389 - 01/07/2018 (last PN: IE 11/11/2018)    Authorization - Visit Number  8    Authorization - Number of Visits  10    PT Start Time  0900    PT Stop Time  0945    PT Time Calculation (min)  45 min    Activity Tolerance  Patient tolerated treatment well;Patient limited by pain;Patient limited by fatigue    Behavior During Therapy  [facility] for tasks assessed/performed       Past Medical History:  Diagnosis Date  . Arthritis   . Carotid artery disease ([place])    a. duplex 07/2017 - 1-39% stenosis bilaterally.  . Environmental and seasonal allergies   . GERD (gastroesophageal reflux disease)   . Hyperlipidemia   . Hypertension   . Migraines    sinus headaches, no longer having migraines  . OSA (obstructive sleep apnea)    CPAP  . Peripheral neuropathy   . Peripheral vascular disease ([place])    carotid blockage - is followed by Dr. Marlou Porch  . Snores   . Wears glasses     Past Surgical History:  Procedure Laterality Date  . ABDOMINAL HYSTERECTOMY  1970   Total HYST  . BREAST BIOPSY Bilateral 2001   benign  . BREAST EXCISIONAL BIOPSY Right 2013   sclerosing lesion  . BREAST LUMPECTOMY WITH RADIOACTIVE SEED LOCALIZATION Left 08/31/2018   Procedure: BREAST LUMPECTOMY WITH RADIOACTIVE SEED LOCALIZATION;  Surgeon: Fanny Skates, MD;  Location: [facility];  Service: General;  Laterality: Left;  . COLONOSCOPY    .  JOINT REPLACEMENT Left 2001   Left Knee Replacement  . KNEE ARTHROSCOPY Left    lt  . NASAL SINUS SURGERY    . TOTAL KNEE ARTHROPLASTY Right 10/12/2018   Procedure: TOTAL KNEE ARTHROPLASTY;  Surgeon: Melrose Nakayama, MD;  Location: [facility];  Service: Orthopedics;  Laterality: Right;  . WISDOM TOOTH EXTRACTION      Vitals:   12/23/18 0909  BP: (!) 116/48  Pulse: 67    Subjective Assessment - 12/23/18 0905    Subjective  Patient reports not much pain but quite a bit of stiffness. She reports she cannot sleep and is sleeping 4 hours of sleep a night. She feels like the CPAP disrupts her sleep.  She feels like her right leg is shorter than the left but "knows it is not." She has been doing Sit <> Stands some but not as much as prescribed. It has been over a week since she came it due to symptoms of hypertension up to 198/98 mmHg. She did not seek medical care but became more careful with her eating. She has been to church where she is singing in the choir during a revival.     Pertinent History  Patient is a 73 y.o. female who presents to outpatient physical therapy with a referral for medical diagnosis s/p R  TKR performed 10/12/2018. This patient's chief complaints consist of pain, stiffness, swelling, weakness, reduced activity tolerance, and difficulty walking, leading to the following functional deficits difficulty with usual ADLs, IADLs, household and community mobility, sleeping, and usual community and volunteer activities.Relevant past medical history and comorbidities include history of left TKA, peripheral neuropathy, sleep apnea, headaches, carotid artery stenosis 39% bilaterally in 07/2017, history of left hammer toe.     Limitations  Sitting;Standing;House hold activities;Walking;Lifting    How long can you sit comfortably?  chair < 15 min    How long can you stand comfortably?  < 5 min    How long can you walk comfortably?  < 0 min    Patient Stated Goals  get back to driving, cooking,  back to her life (singing, volunteering at church, gardening).    Currently in Pain?  Yes    Pain Location  Knee    Pain Orientation  Right    Pain Descriptors / Indicators  Other (Comment)   stiffness   Pain Type  Surgical pain    Pain Onset  More than a month ago       OBJECTIVE:  R AAROM knee flexion: 110 degrees.  R AAROM knee extension (seated after eccentric flexion): -5 degrees  TREATMENT: Therapeutic exercise:to centralize symptoms and improve ROM and strength required for successful completion of functional activities.  - blood pressure measurements (see above) and education about blood pressure and safety.  -Recumbent Bikeno added resistance at closest tolerable seat position to improve knee flexion. Seat position5 x 3 min.  Seat position 4 x 3 min. For improved lower extremity ROM, muscular endurance, and activity tolerance; and to induce the analgesic effect of aerobic exercise, stimulate joint nutrition, and prepare body structures and systems for following interventions. x6 totalmin during subjective exam - prone quad set lifting knee off table with toes curled under. To improve end range quad contraction and stretch posterior knee. Cuing for end range hold. 5 second holds. X 2 min plus time for transitions.  - seated right knee flexion eccentric knee flexion (flexion to extension) with heel propped up on rolling stool, with 10# ankle weight applied at the knee, rolling stool in and out. X15 with max encouragement. Able to achieve -5 degrees extension. - seated long arc quad.cuing for strong quad contraction.To improve quad strength for functional mobility and improved activity tolerance.3x10 each side with 2 second hold in extension at 10# ankle weight.    Manual therapy: to reduce pain and tissue tension, improve range of motion, neuromodulation, in order to promote improved ability to complete functional activities. - edema management techniques: flat finger  circles to right anterior thigh to prepare to receive fluid. Scooping technique at right gastroc and lower leg region to move fluid out. Attempted thumb circles at R knee, but unable to tolerate or be effective due to pain (pain inhibits lymph activation).    HOME EXERCISE PROGRAM Access Code: [email or web address]  URL: [email or web address]  Date: 11/16/2018  Prepared by: Rosita Kea   Exercises   Active Straight Leg Raise with Quad Set - 10-15 reps - 5 second hold - 1-3 Sets - 1x daily - 7x weekly   Supine Heel Slide with Strap - 10-15 reps - 5 second hold - 1-3 Sets - 1x daily - 7x weekly   Supine Bridge - 10-15 reps - 1 second hold - 1-3 Sets - 1x daily - 7x weekly   Clamshell - 10-15 reps - 1 second hold - 1-3  Sets - 1x daily - 7x weekly   Sit to Stand without Arm Support - 10-15 reps - 1 second hold - 3 Sets - 1x daily - 7x weekly   Patient response to treatment:  Pt tolerated treatmentwell but continues to be limited by pain. Eccentric hamstring activation was completed to improve extension in right knee with improvement noted following. Pt tolerated better in seated position compared to prone. Bike moved forward one level to improve knee flexion. Patient noted for improving range of motion. She continues to demonstrate altered gait pattern with antalgic gait noted favoring right LE. Pt required cuing for proper technique and to facilitate improved neuromuscular control, strength, range of motion, and functional ability.Feeling good at end of session. Reports feeling looser.    PT Education - 12/23/18 0919    Education Details  Exercise purpose/form. Self management techniques. Education on diagnosis, prognosis, POC, anatomy and physiology of current condition. Blood pressure    Person(s) Educated  Patient    Methods  Explanation;Demonstration;Tactile cues;Verbal cues    Comprehension  Verbalized understanding;Returned demonstration       PT Short Term Goals -  12/09/18 1711      PT SHORT TERM GOAL #1   Title  Be independent with initial home exercise program for self-management of symptoms.    Baseline  Initial OP HEP to be provided visit 2.     Time  2    Period  Weeks    Status  Achieved    Target Date  11/26/18      PT SHORT TERM GOAL #2   Title  Patient will be able to ascend and descend four 6-inch steps with step over step gait pattern and no UE support to improve functional mobility.     Baseline  see objective exam from 11/11/2018    Time  2    Period  Weeks    Status  Partially Met    Target Date  12/23/18      PT SHORT TERM GOAL #3   Title  Patient will be able to return to driving without limitations from right knee surgery to improve independence with IADLs.     Baseline  Not yet driving (12/18/3660);     Time  4    Period  Weeks    Status  Achieved    Target Date  12/10/18      PT SHORT TERM GOAL #4   Title  Improve right knee AROM to equal or greater than 0 degrees extension and 110 flexion to allow patient to complete valued activities with less difficulty.     Baseline  see objective exam (11/11/2018)    Time  4    Period  Weeks    Status  Partially Met    Target Date  01/06/19      PT SHORT TERM GOAL #5   Title  Improve right hip abduction strength to 3+/5 for improved stability in SLS to allow patient to complete valued functional tasks such as walking, climbing/descending stairs, and getting in and out of care safely.    Baseline  MMT = 2/5 (11/11/2018)    Time  4    Period  Weeks    Status  New        PT Long Term Goals - 11/12/18 1210      PT LONG TERM GOAL #1   Title  Be independent with a long-term home exercise program for self-management of symptoms    Time  8    Period  Weeks    Status  New    Target Date  01/07/19      PT LONG TERM GOAL #2   Title  Demonstrate improved FOTO score by 10 units to demonstrate improvement in overall condition and self-reported functional ability.     Baseline  baseline  to be assessed on visit 2    Time  8    Period  Weeks    Status  New    Target Date  01/07/19      PT LONG TERM GOAL #3   Title  Improve right knee PROM to equal or greater than 125 degrees flexion to allow patient to complete valued activities with less difficulty.     Baseline  98 degrees (11/11/2018)    Time  8    Period  Weeks    Status  New    Target Date  01/07/19      PT LONG TERM GOAL #4   Title  Improve B hip  strength to equal or greater than 4+/5 and bilateral knee strength to 5/5 for improved ability to complete valued functional tasks such as walking, climbing/descending stairs, community activities.    Baseline  R knee flex and ext both MMT = 4/5 (11/11/2017).    Time  8    Period  Weeks    Status  New    Target Date  01/07/19      PT LONG TERM GOAL #5   Title  Complete community, work and/or recreational activities without limitation due to current condition.     Time  8    Period  Weeks    Status  New    Target Date  01/07/19            Plan - 12/23/18 1013    Clinical Impression Statement  Patient has been unable to attend physical treatment sessions for over a week due to out of control blood pressure, but does not appear to have regressed at this point.  Her blood pressure was within safe limits for exercise today and normal for her according to pt. Patient is continuing to make progress towards her goals. Patient is a 73 y.o. female referred to outpatient physical therapy with a medical diagnosis of s/p R TKA performed 10/12/2018 who presents with signs and symptoms consistent with right knee pain, stiffness, swelling, weakness, and dysfunction s/p R TKA leading to decreased overall activity tolerance, physical conditioning and strength, disrupted gait pattern, and decreased ability to perform usual ADLs, IADLs, household and community mobility, usual social and community participation. Patient will benefit from further physical therapy intervention to address her  remaining impairments and functional deficits, work towards stated goals and return to PLOF    Rehab Potential  Good    Clinical Impairments Affecting Rehab Potential  (+) motivation, positive outlook, good response to PT so far; (-) multiple comorbidities, risk of infection, lives alone    PT Frequency  2x / week    PT Duration  8 weeks    PT Treatment/Interventions  ADLs/Self Care Home Management;Cryotherapy;Moist Heat;Gait training;Functional mobility training;Stair training;Therapeutic activities;Therapeutic exercise;Balance training;Neuromuscular re-education;Patient/family education;Manual techniques;Compression bandaging;Scar mobilization;Passive range of motion;Dry needling;Joint Manipulations;Other (comment)   joint mobilizations grades I-V   PT Next Visit Plan  Continue progressive strengthening and mobility as tolerated. check blood pressure. Eccentric end range strengthening for improved mobility and strength.     PT Home Exercise Plan  Medbridge Access Code: [email or web address]  Consulted and Agree with Plan of Care  Patient       Patient will benefit from skilled therapeutic intervention in order to improve the following deficits and impairments:  Abnormal gait, Decreased activity tolerance, Decreased endurance, Decreased range of motion, Decreased skin integrity, Decreased strength, Hypomobility, Impaired perceived functional ability, Impaired sensation, Improper body mechanics, Pain, Decreased balance, Decreased mobility, Decreased scar mobility, Difficulty walking, Increased edema, Increased muscle spasms, Impaired flexibility, Obesity, Postural dysfunction  Visit Diagnosis: Right knee pain, unspecified chronicity  Stiffness of right knee, not elsewhere classified  Muscle weakness (generalized)  Difficulty in walking, not elsewhere classified     Problem List Patient Active Problem List   Diagnosis Date Noted  . Primary osteoarthritis of right knee 10/12/2018  . Obstructive  sleep apnea treated with continuous positive airway pressure (CPAP) 01/28/2018  . OSA (obstructive sleep apnea) 08/27/2017  . Excessive daytime sleepiness 08/27/2017  . Paronychia of toe 06/16/2016  . Chronic sphenoidal sinusitis 08/13/2015  . Migraine 11/22/2014  . Sleep apnea 11/22/2014  . Abnormal MRI of head 07/04/2013  . Headache(784.0) 03/29/2013  . Peripheral neuropathy   . Major depressive disorder   . Abnormal mammogram 06/10/2012    Nancy Nordmann, PT, DPT 12/23/2018, 10:14 AM  [facility] [address] [place], [address] Phone: [phone]   Fax:  [phone]  Name: Pamela Hendricks MRN: [id number] Date of Birth: 04/05/1946

## 2018-12-28 ENCOUNTER — Encounter: Payer: Self-pay | Admitting: Physical Therapy

## 2018-12-28 ENCOUNTER — Ambulatory Visit: Payer: Medicare Other | Admitting: Physical Therapy

## 2018-12-28 VITALS — BP 119/54 | HR 74 | Temp 98.0°F

## 2018-12-28 DIAGNOSIS — M25561 Pain in right knee: Secondary | ICD-10-CM

## 2018-12-28 DIAGNOSIS — M25661 Stiffness of right knee, not elsewhere classified: Secondary | ICD-10-CM

## 2018-12-28 DIAGNOSIS — R262 Difficulty in walking, not elsewhere classified: Secondary | ICD-10-CM

## 2018-12-28 DIAGNOSIS — M6281 Muscle weakness (generalized): Secondary | ICD-10-CM

## 2018-12-28 NOTE — Therapy (Signed)
[facility] [address] [place], [address] Phone: [phone]   Fax:  [phone]  Physical Therapy Treatment  Patient Details  Name: Pamela Hendricks MRN: [id number] Date of Birth: September 06, 1946 Referring Provider (PT): Melrose Nakayama, MD   Encounter Date: 12/28/2018  PT End of Session - 12/28/18 1018    Visit Number  9    Number of Visits  16    Date for PT Re-Evaluation  01/07/19    Authorization Type  Medicare reporting period from 11/11/2018    Authorization Time Period  Current Cert period: 02/08/6605 - 01/07/2018 (last PN: IE 11/11/2018)    Authorization - Visit Number  9    Authorization - Number of Visits  10    PT Start Time  1010    PT Stop Time  1050    PT Time Calculation (min)  40 min    Activity Tolerance  Patient tolerated treatment well;Patient limited by pain;Patient limited by fatigue    Behavior During Therapy  [facility] for tasks assessed/performed       Past Medical History:  Diagnosis Date  . Arthritis   . Carotid artery disease ([place])    a. duplex 07/2017 - 1-39% stenosis bilaterally.  . Environmental and seasonal allergies   . GERD (gastroesophageal reflux disease)   . Hyperlipidemia   . Hypertension   . Migraines    sinus headaches, no longer having migraines  . OSA (obstructive sleep apnea)    CPAP  . Peripheral neuropathy   . Peripheral vascular disease ([place])    carotid blockage - is followed by Dr. Marlou Porch  . Snores   . Wears glasses     Past Surgical History:  Procedure Laterality Date  . ABDOMINAL HYSTERECTOMY  1970   Total HYST  . BREAST BIOPSY Bilateral 2001   benign  . BREAST EXCISIONAL BIOPSY Right 2013   sclerosing lesion  . BREAST LUMPECTOMY WITH RADIOACTIVE SEED LOCALIZATION Left 08/31/2018   Procedure: BREAST LUMPECTOMY WITH RADIOACTIVE SEED LOCALIZATION;  Surgeon: Fanny Skates, MD;  Location: [place];  Service: General;  Laterality: Left;  . COLONOSCOPY    .  JOINT REPLACEMENT Left 2001   Left Knee Replacement  . KNEE ARTHROSCOPY Left    lt  . NASAL SINUS SURGERY    . TOTAL KNEE ARTHROPLASTY Right 10/12/2018   Procedure: TOTAL KNEE ARTHROPLASTY;  Surgeon: Melrose Nakayama, MD;  Location: [facility];  Service: Orthopedics;  Laterality: Right;  . WISDOM TOOTH EXTRACTION      Vitals:   12/28/18 1016  BP: (!) 119/54  Pulse: 74  Temp: 98 F (36.7 C)  TempSrc: Oral    Subjective Assessment - 12/28/18 1016    Subjective  Patient reports 5/10 pain in the right knee upon arrival. States she felt better than usual following last treatment session. Report she continues to have difficulty sleeping and has not done one sit <> stand as part of her HEP since last treatment sesion.  complains of it being hot in room. Able to repeat back importance of home exercises.     Pertinent History  Patient is a 73 y.o. female who presents to outpatient physical therapy with a referral for medical diagnosis s/p R TKR performed 10/12/2018. This patient's chief complaints consist of pain, stiffness, swelling, weakness, reduced activity tolerance, and difficulty walking, leading to the following functional deficits difficulty with usual ADLs, IADLs, household and community mobility, sleeping, and usual community and volunteer activities.Relevant past  medical history and comorbidities include history of left TKA, peripheral neuropathy, sleep apnea, headaches, carotid artery stenosis 39% bilaterally in 07/2017, history of left hammer toe.     Limitations  Sitting;Standing;House hold activities;Walking;Lifting    How long can you sit comfortably?  chair < 15 min    How long can you stand comfortably?  < 5 min    How long can you walk comfortably?  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    Currently in Pain?  Yes    Pain Score  5     Pain Location  Knee    Pain Orientation  Right    Pain  Descriptors / Indicators  Other (Comment)   stiffness   Pain Type  Surgical pain    Pain Onset  More than a month ago    Pain Frequency  Constant       OBJECTIVE:  R AAROM knee flexion: 110 degrees.  R AAROM knee extension (seated after eccentric flexion): -5 degrees  **PROGRESS NOTE NEXT VISIT**  TREATMENT: Therapeutic exercise:to centralize symptoms and improve ROM and strength required for successful completion of functional activities.  - blood pressure measurements (see above) and education about blood pressure and safety.  -Recumbent [place] added resistance at closest tolerable seat position to improve knee flexion. Seat position4x 3 min. Seat position 3 x 3 min. Able to get around at level 1 today but not comfortable enough to stay there for more than a few cycles. For improved lower extremity ROM, muscular endurance, and activity tolerance; and to induce the analgesic effect of aerobic exercise, stimulate joint nutrition, and prepare body structures and systems for following interventions. x6 totalmin during subjective exam - seated right knee eccentric knee flexion (flexion to extension) with heel propped up on rolling stool, with 15# ankle weight applied at the knee, rolling stool in and out. X15 with max encouragement. Able to achieve -5 degrees extension. - seated right knee flexion (heel propped on rolling stool), to extension, then quad set to straight leg raise. X10. To improve quad and hamstring strength. Cuing to continue with exercise and for form. Pt found it uncomfortable.  - standing eccentric right knee extension (step standing with right foot on step, lowering body towards wall on opposite side of step attempting to touch knee to wall and push back). BUE support with PVC poles on ground, then on wall. Heavy cuing to use leg more than arms. To improve flexion ROM and end range strength. CGA x1 for safety. Pt complained of feeling unsafe when using poles.    Manual therapy: to reduce pain and tissue tension, improve range of motion, neuromodulation, in order to promote improved ability to complete functional activities. - edema management techniques: flat finger circles to right anterior thigh to prepare to receive fluid. Scooping technique at right gastroc and lower leg region to move fluid out. Attempted thumb circles at R knee, but unable to tolerate or be effective due to pain (pain inhibits lymph activation).    HOME EXERCISE PROGRAM Access Code: [email or web address]  URL: [email or web address]  Date: 11/16/2018  Prepared by: Rosita Kea   Exercises   Active Straight Leg Raise with Quad Set - 10-15 reps - 5 second hold - 1-3 Sets - 1x daily - 7x weekly   Supine Heel Slide with Strap - 10-15 reps - 5 second hold - 1-3 Sets - 1x daily -  7x weekly   Supine Bridge - 10-15 reps - 1 second hold - 1-3 Sets - 1x daily - 7x weekly   Clamshell - 10-15 reps - 1 second hold - 1-3 Sets - 1x daily - 7x weekly   Sit to Stand without Arm Support - 10-15 reps - 1 second hold - 3 Sets - 1x daily - 7x weekly   Patient response to treatment:  Pt tolerated treatment fair but continues to be limited by pain and has difficulty completing complete set of several exercises. Eccentric hamstring and quad activation was completed to improve extension and flexion in right knee with improvement noted following. PBike moved forward one level to improve knee flexion. Patient noted for improving range of motion. She continues to demonstrate altered gait pattern with antalgic gait noted favoring right LE. States she is trying to walk normal when cued to improve gait pattern.Pt required cuing for proper technique and to facilitate improved neuromuscular control, strength, range of motion, and functional ability.     PT Education - 12/28/18 1018    Education Details  Exercise purpose/form. Self management techniques. Education on diagnosis, prognosis, POC,  anatomy and physiology of current condition. Blood pressure    Person(s) Educated  Patient    Methods  Explanation;Demonstration;Tactile cues;Verbal cues    Comprehension  Verbalized understanding;Returned demonstration       PT Short Term Goals - 12/09/18 1711      PT SHORT TERM GOAL #1   Title  Be independent with initial home exercise program for self-management of symptoms.    Baseline  Initial OP HEP to be provided visit 2.     Time  2    Period  Weeks    Status  Achieved    Target Date  11/26/18      PT SHORT TERM GOAL #2   Title  Patient will be able to ascend and descend four 6-inch steps with step over step gait pattern and no UE support to improve functional mobility.     Baseline  see objective exam from 11/11/2018    Time  2    Period  Weeks    Status  Partially Met    Target Date  12/23/18      PT SHORT TERM GOAL #3   Title  Patient will be able to return to driving without limitations from right knee surgery to improve independence with IADLs.     Baseline  Not yet driving (03/15/2562);     Time  4    Period  Weeks    Status  Achieved    Target Date  12/10/18      PT SHORT TERM GOAL #4   Title  Improve right knee AROM to equal or greater than 0 degrees extension and 110 flexion to allow patient to complete valued activities with less difficulty.     Baseline  see objective exam (11/11/2018)    Time  4    Period  Weeks    Status  Partially Met    Target Date  01/06/19      PT SHORT TERM GOAL #5   Title  Improve right hip abduction strength to 3+/5 for improved stability in SLS to allow patient to complete valued functional tasks such as walking, climbing/descending stairs, and getting in and out of care safely.    Baseline  MMT = 2/5 (11/11/2018)    Time  4    Period  Weeks    Status  New  PT Long Term Goals - 11/12/18 1210      PT LONG TERM GOAL #1   Title  Be independent with a long-term home exercise program for self-management of symptoms    Time   8    Period  Weeks    Status  New    Target Date  01/07/19      PT LONG TERM GOAL #2   Title  Demonstrate improved FOTO score by 10 units to demonstrate improvement in overall condition and self-reported functional ability.     Baseline  baseline to be assessed on visit 2    Time  8    Period  Weeks    Status  New    Target Date  01/07/19      PT LONG TERM GOAL #3   Title  Improve right knee PROM to equal or greater than 125 degrees flexion to allow patient to complete valued activities with less difficulty.     Baseline  98 degrees (11/11/2018)    Time  8    Period  Weeks    Status  New    Target Date  01/07/19      PT LONG TERM GOAL #4   Title  Improve B hip  strength to equal or greater than 4+/5 and bilateral knee strength to 5/5 for improved ability to complete valued functional tasks such as walking, climbing/descending stairs, community activities.    Baseline  R knee flex and ext both MMT = 4/5 (11/11/2017).    Time  8    Period  Weeks    Status  New    Target Date  01/07/19      PT LONG TERM GOAL #5   Title  Complete community, work and/or recreational activities without limitation due to current condition.     Time  8    Period  Weeks    Status  New    Target Date  01/07/19            Plan - 12/28/18 1311    Clinical Impression Statement  Patient's blood pressure seems to be back under control today. She continues to be hypersensitive to touch over the incision site and limited in her ability to complete activities by pain. Patient is continuing to make progress towards her goals, but her progress has been delayed by missed treatment sessions and lack of consistent HEP performance. Patient is a 73 y.o. female referred to outpatient physical therapy with a medical diagnosis of s/p R TKA performed 10/12/2018 who presents with signs and symptoms consistent with right knee pain, stiffness, swelling, weakness, and dysfunction s/p R TKA leading to decreased overall activity  tolerance, physical conditioning and strength, disrupted gait pattern, and decreased ability to perform usual ADLs, IADLs, household and community mobility, usual social and community participation. Patient will benefit from further physical therapy intervention to address her remaining impairments and functional deficits, work towards stated goals and return to PLOF    Rehab Potential  Good    Clinical Impairments Affecting Rehab Potential  (+) motivation, positive outlook, good response to PT so far; (-) multiple comorbidities, risk of infection, lives alone    PT Frequency  2x / week    PT Duration  8 weeks    PT Treatment/Interventions  ADLs/Self Care Home Management;Cryotherapy;Moist Heat;Gait training;Functional mobility training;Stair training;Therapeutic activities;Therapeutic exercise;Balance training;Neuromuscular re-education;Patient/family education;Manual techniques;Compression bandaging;Scar mobilization;Passive range of motion;Dry needling;Joint Manipulations;Other (comment)   joint mobilizations grades I-V   PT Next Visit  Plan  Continue progressive strengthening and mobility as tolerated. check blood pressure. Eccentric end range strengthening for improved mobility and strength.     PT Home Exercise Plan  Medbridge Access Code: [email or web address]     Consulted and Agree with Plan of Care  Patient       Patient will benefit from skilled therapeutic intervention in order to improve the following deficits and impairments:  Abnormal gait, Decreased activity tolerance, Decreased endurance, Decreased range of motion, Decreased skin integrity, Decreased strength, Hypomobility, Impaired perceived functional ability, Impaired sensation, Improper body mechanics, Pain, Decreased balance, Decreased mobility, Decreased scar mobility, Difficulty walking, Increased edema, Increased muscle spasms, Impaired flexibility, Obesity, Postural dysfunction  Visit Diagnosis: Right knee pain, unspecified  chronicity  Stiffness of right knee, not elsewhere classified  Muscle weakness (generalized)  Difficulty in walking, not elsewhere classified     Problem List Patient Active Problem List   Diagnosis Date Noted  . Primary osteoarthritis of right knee 10/12/2018  . Obstructive sleep apnea treated with continuous positive airway pressure (CPAP) 01/28/2018  . OSA (obstructive sleep apnea) 08/27/2017  . Excessive daytime sleepiness 08/27/2017  . Paronychia of toe 06/16/2016  . Chronic sphenoidal sinusitis 08/13/2015  . Migraine 11/22/2014  . Sleep apnea 11/22/2014  . Abnormal MRI of head 07/04/2013  . Headache(784.0) 03/29/2013  . Peripheral neuropathy   . Major depressive disorder   . Abnormal mammogram 06/10/2012    Nancy Nordmann, PT, DPT 12/28/2018, 1:12 PM  [facility] [address] [place], [address] Phone: [phone]   Fax:  [phone]  Name: Pamela Hendricks MRN: [id number] Date of Birth: May 17, 1946

## 2018-12-30 ENCOUNTER — Ambulatory Visit: Payer: Medicare Other | Admitting: Physical Therapy

## 2018-12-30 ENCOUNTER — Encounter: Payer: Self-pay | Admitting: Physical Therapy

## 2018-12-30 DIAGNOSIS — M6281 Muscle weakness (generalized): Secondary | ICD-10-CM

## 2018-12-30 DIAGNOSIS — R262 Difficulty in walking, not elsewhere classified: Secondary | ICD-10-CM

## 2018-12-30 DIAGNOSIS — M25661 Stiffness of right knee, not elsewhere classified: Secondary | ICD-10-CM

## 2018-12-30 DIAGNOSIS — M25561 Pain in right knee: Secondary | ICD-10-CM

## 2018-12-30 NOTE — Addendum Note (Signed)
Addended by: Rosita Kea R on: 12/30/2018 10:59 PM   Modules accepted: Orders

## 2018-12-30 NOTE — Therapy (Signed)
[facility] [address], [place], [address] Phone: [phone]   Fax:  [phone]  Physical Therapy Treatment / Progress Note / Re-Certification Reporting Period: 11/11/2018 - 12/30/2018  Patient Details  Name: Pamela Hendricks MRN: [id number] Date of Birth: 08-Sep-1946 Referring Provider (PT): Melrose Nakayama, MD   Encounter Date: 12/30/2018  PT End of Session - 12/30/18 2243    Visit Number  10    Number of Visits  16    Date for PT Re-Evaluation  01/27/19    Authorization Type  Medicare reporting period from 11/11/2018    Authorization Time Period  Current Cert period: 2/99/3716 - 01/27/2019 (last PN: 12/30/2018)    Authorization - Visit Number  1    Authorization - Number of Visits  7    PT Start Time  0945    PT Stop Time  1030    PT Time Calculation (min)  45 min    Activity Tolerance  Patient tolerated treatment well;Patient limited by pain;Patient limited by fatigue    Behavior During Therapy  [facility] for tasks assessed/performed       Past Medical History:  Diagnosis Date  . Arthritis   . Carotid artery disease ([place])    a. duplex 07/2017 - 1-39% stenosis bilaterally.  . Environmental and seasonal allergies   . GERD (gastroesophageal reflux disease)   . Hyperlipidemia   . Hypertension   . Migraines    sinus headaches, no longer having migraines  . OSA (obstructive sleep apnea)    CPAP  . Peripheral neuropathy   . Peripheral vascular disease ([place])    carotid blockage - is followed by Dr. Marlou Porch  . Snores   . Wears glasses     Past Surgical History:  Procedure Laterality Date  . ABDOMINAL HYSTERECTOMY  1970   Total HYST  . BREAST BIOPSY Bilateral 2001   benign  . BREAST EXCISIONAL BIOPSY Right 2013   sclerosing lesion  . BREAST LUMPECTOMY WITH RADIOACTIVE SEED LOCALIZATION Left 08/31/2018   Procedure: BREAST LUMPECTOMY WITH RADIOACTIVE SEED LOCALIZATION;  Surgeon: Fanny Skates, MD;   Location: [facility];  Service: General;  Laterality: Left;  . COLONOSCOPY    . JOINT REPLACEMENT Left 2001   Left Knee Replacement  . KNEE ARTHROSCOPY Left    lt  . NASAL SINUS SURGERY    . TOTAL KNEE ARTHROPLASTY Right 10/12/2018   Procedure: TOTAL KNEE ARTHROPLASTY;  Surgeon: Melrose Nakayama, MD;  Location: [facility];  Service: Orthopedics;  Laterality: Right;  . WISDOM TOOTH EXTRACTION      There were no vitals filed for this visit.  Subjective Assessment - 12/30/18 1002    Subjective  Patient reports 5/10 pain around the entire right knee upon arrival. She states she was very sore after last treatment session and it is just feelling better today. She did not do any sit <> stands since last treatment session. State she walked a lot since last session.  She requets for today's treatment to be easy.     Pertinent History  Patient is a 73 y.o. female who presents to outpatient physical therapy with a referral for medical diagnosis s/p R TKR performed 10/12/2018. This patient's chief complaints consist of pain, stiffness, swelling, weakness, reduced activity tolerance, and difficulty walking, leading to the following functional deficits difficulty with usual ADLs, IADLs, household and community mobility, sleeping, and usual community and volunteer activities.Relevant past medical history and comorbidities include history of left TKA,  peripheral neuropathy, sleep apnea, headaches, carotid artery stenosis 39% bilaterally in 07/2017, history of left hammer toe.     Limitations  Sitting;Standing;House hold activities;Walking;Lifting    How long can you sit comfortably?  chair < 15 min    How long can you stand comfortably?  < 5 min    How long can you walk comfortably?  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    Currently in Pain?  Yes    Pain Score  5     Pain Location  Knee    Pain Radiating Towards  distal lateral right  thigh.     Pain Onset  More than a month ago         [facility] PT Assessment - 12/30/18 0001      Observation/Other Assessments   Observations  seen note from 12/30/2018 for latest objective data    Focus on Therapeutic Outcomes (FOTO)   43         OBJECTIVE:   PERIPHERAL JOINT MOTION (AROM/PROM in degrees):  Knee - R PROM/AAROM knee flexion: 115degrees (seated, following eccentric extension with yellow theraband). 105 degrees PROM/AAROM in supine.  - RAAROM knee extension(seated after eccentric flexion): -5degrees - L Flexion: 110/112.  - L Extension: L = 0.  STRENGTH:  *Indicates pain   Hip         Flexion: R = 4/5*, L = 4+/5.  Abduction: R = 3/5*, L = 4/5. Knee  Extension: R = 4+/5, L = 5/5.  Flex: R = 4+/5*, L = /5. Ankle (seated position): B WNL for basic tasks  ACCESSORY MOTION:   Not performed due to high sensitivity to touch.   PALPATION:  TTP with hyperalgesia and allodynia surrounding incision.   FUNCTIONAL MOBILITY:  Bed mobility: supine <> sit and rolling, I   Transfers: I without use of BUE from 18" plinth  Gait: ambulates I without AD for household and short community distances. Antalgic gait favoring right LE. Lacks full right knee extension.   FUNCTIONAL/BALANCE TESTS: 30 Second Sit to Stand Test: 11 with no UE support from chair.  5TSTS: 13 seconds.  Timed Up and GO: 7 seconds first trial, no AD.     TREATMENT: Therapeutic exercise:to centralize symptoms and improve ROM and strength required for successful completion of functional activities.  - blood pressure measurements (see above) and education about blood pressure and safety.  -Recumbent B[place]eno added resistance at closest tolerable seat position to improve knee flexion. Seat position3x 3 min.Seat position 2x 3 min, seat position 1 x 3 min. For improved lower extremity ROM, muscular endurance, and activity tolerance; and to induce the analgesic effect of  aerobic exercise, stimulate joint nutrition, and prepare body structures and systems for following interventions. x10 totalmin during subjective exam and education.  - seated right knee eccentric knee flexion (flexion to extension) with heel propped up on rolling stool, with 15# ankle weight applied at the knee, rolling stool in and out. X10 with max encouragement. Able to achieve -5 degrees extension. - seated eccentric right knee extension (seated on plinth with yellow theraband looped around ankle and controlled by clinician, focus on slowly allowing knee to flex to maximum position). Cuing for technique. To improve strength and mobility. - functional testing: 5TSTS, STS in 30 seconds, and TUG to assess progress (see above).  - examination to assess progress toward goals (see above).  -  Education on diagnosis, prognosis, POC, anatomy and physiology of current condition.   Manual therapy:to reduce pain and tissue tension, improve range of motion, neuromodulation, in order to promote improved ability to complete functional activities. - seated right tibeofemoral joint distraction grade III with flexion overpresure to tolerance x 10. To improve motion.  - seated R knee extension overpresure to tolerance.    HOME EXERCISE PROGRAM Access Code: [email or web address]  URL: [email or web address]  Date: 11/16/2018  Prepared by: Rosita Kea   Exercises   Active Straight Leg Raise with Quad Set - 10-15 reps - 5 second hold - 1-3 Sets - 1x daily - 7x weekly   Supine Heel Slide with Strap - 10-15 reps - 5 second hold - 1-3 Sets - 1x daily - 7x weekly   Supine Bridge - 10-15 reps - 1 second hold - 1-3 Sets - 1x daily - 7x weekly   Clamshell - 10-15 reps - 1 second hold - 1-3 Sets - 1x daily - 7x weekly   Sit to Stand without Arm Support - 10-15 reps - 1 second hold - 3 Sets - 1x daily - 7x weekly   Patient response to treatment:  Pt tolerated treatment fair but continues to be limited  by pain and has difficulty completing complete sets of several exercises due to pain. Requires strong encouragment. Eccentric hamstring and quad activation was continued to improve extension and flexion in right knee with improvement noted following. Pt continues to lack good tolerance form annual techniques for improved motion. Bike moved forward one level to improve knee flexion.Patient noted for improving range of motion.She continues to demonstrate altered gait pattern with antalgic gait noted favoring right LE. States she is trying to walk normal when cued to improve gait pattern.Pt required cuing for proper technique and to facilitate improved neuromuscular control, strength, range of motion, and functional ability.     PT Short Term Goals - 12/30/18 2247      PT SHORT TERM GOAL #1   Title  Be independent with initial home exercise program for self-management of symptoms.    Baseline  Initial OP HEP to be provided visit 2.     Time  2    Period  Weeks    Status  Achieved    Target Date  11/26/18      PT SHORT TERM GOAL #2   Title  Patient will be able to ascend and descend four 6-inch steps with step over step gait pattern and no UE support to improve functional mobility.     Baseline  see objective exam from 11/11/2018; uses unilateral UE support (12/30/2018):     Time  2    Period  Weeks    Status  Partially Met    Target Date  01/13/19      PT SHORT TERM GOAL #3   Title  Patient will be able to return to driving without limitations from right knee surgery to improve independence with IADLs.     Baseline  Not yet driving (12/11/9756);     Time  4    Period  Weeks    Status  Achieved    Target Date  12/10/18      PT SHORT TERM GOAL #4   Title  Improve right knee AROM to equal or greater than 0 degrees extension and 110 flexion to allow patient to complete valued activities with less difficulty.     Baseline  see objective exam (11/11/2018, 12/30/2018)    Time  4    Period  Weeks     Status  Partially Met    Target Date  01/27/19      PT SHORT TERM GOAL #5   Title  Improve right hip abduction strength to 3+/5 for improved stability in SLS to allow patient to complete valued functional tasks such as walking, climbing/descending stairs, and getting in and out of care safely.    Baseline  MMT = 2/5 (11/11/2018)    Time  4    Period  Weeks    Status  Achieved    Target Date  12/10/18        PT Long Term Goals - 12/30/18 2248      PT LONG TERM GOAL #1   Title  Be independent with a long-term home exercise program for self-management of symptoms    Time  8    Period  Weeks    Status  Partially Met    Target Date  01/27/19      PT LONG TERM GOAL #2   Title  Demonstrate improved FOTO score by 10 units to demonstrate improvement in overall condition and self-reported functional ability.     Baseline  did not assess until visit 10 when equald 43 (12/30/2018)    Time  8    Period  Weeks    Status  Unable to assess    Target Date  01/27/19      PT LONG TERM GOAL #3   Title  Improve right knee PROM to equal or greater than 125 degrees flexion to allow patient to complete valued activities with less difficulty.     Baseline  98 degrees (11/11/2018); 110 (12/30/2018);     Time  8    Period  Weeks    Status  Partially Met    Target Date  01/27/19      PT LONG TERM GOAL #4   Title  Improve B hip  strength to equal or greater than 4+/5 and bilateral knee strength to 5/5 for improved ability to complete valued functional tasks such as walking, climbing/descending stairs, community activities.    Baseline  R knee flex and ext both MMT = 4/5 (11/11/2017). progressing (12/30/2018);     Time  8    Period  Weeks    Status  Partially Met    Target Date  01/27/19      PT LONG TERM GOAL #5   Title  Complete community, work and/or recreational activities without limitation due to current condition.     Time  8    Period  Weeks    Status  Partially Met    Target Date  01/27/19             Plan - 12/30/18 2255    Clinical Impression Statement  Patient has attended 10 physical therapy treatment sessions this episode of care and is continuing to make overall progress towards goals. She has had difficulty tolerating interventions and has missed several appointments due to increased pain following sessions. She requires strong encouragement to participate during sessions and is inconsistent with many of her HEP programs although she makes good effort to continue walking and moving. Patient is making progress towards goals although she has not achieved the range of motion expected at this point. Her right knee flexion is approaching that of her left knee (previously replaced) at 105- 110 degrees. She continues to ambulate with altered gait pattern but is improving in her functional abilities for  basic mobility. She continues to be hypersensitive to touch over the incision site and limited in her ability to complete activities by pain. Patient is continuing to make progress towards her goals, but her progress has been delayed by missed treatment sessions and lack of consistent HEP performance. She has not been able to complete all of her planned sessions in her first certification period so it is recommended to extend it to complete sessions for better functional outcome. Patient is a 73 y.o. female referred to outpatient physical therapy with a medical diagnosis of s/p R TKA performed 10/12/2018 who presents with signs and symptoms consistent with right knee pain, stiffness, swelling, weakness, and dysfunction s/p R TKA leading to decreased overall activity tolerance, physical conditioning and strength, disrupted gait pattern, and decreased ability to perform usual ADLs, IADLs, household and community mobility, usual social and community participation. Patient will benefit from further physical therapy intervention to address her remaining impairments and functional deficits, work  towards stated goals and return to PLOF    Rehab Potential  Good    Clinical Impairments Affecting Rehab Potential  (+) motivation, positive outlook, good response to PT so far; (-) multiple comorbidities, risk of infection, lives alone    PT Frequency  2x / week    PT Duration  4 weeks    PT Treatment/Interventions  ADLs/Self Care Home Management;Cryotherapy;Moist Heat;Gait training;Functional mobility training;Stair training;Therapeutic activities;Therapeutic exercise;Balance training;Neuromuscular re-education;Patient/family education;Manual techniques;Compression bandaging;Scar mobilization;Passive range of motion;Dry needling;Joint Manipulations;Other (comment)   joint mobilizations grades I-V   PT Next Visit Plan  Continue progressive strengthening and mobility as tolerated. check blood pressure. Eccentric end range strengthening for improved mobility and strength.     PT Home Exercise Plan  Medbridge Access Code: [email or web address]     Consulted and Agree with Plan of Care  Patient       Patient will benefit from skilled therapeutic intervention in order to improve the following deficits and impairments:  Abnormal gait, Decreased activity tolerance, Decreased endurance, Decreased range of motion, Decreased skin integrity, Decreased strength, Hypomobility, Impaired perceived functional ability, Impaired sensation, Improper body mechanics, Pain, Decreased balance, Decreased mobility, Decreased scar mobility, Difficulty walking, Increased edema, Increased muscle spasms, Impaired flexibility, Obesity, Postural dysfunction  Visit Diagnosis: Right knee pain, unspecified chronicity  Stiffness of right knee, not elsewhere classified  Muscle weakness (generalized)  Difficulty in walking, not elsewhere classified     Problem List Patient Active Problem List   Diagnosis Date Noted  . Primary osteoarthritis of right knee 10/12/2018  . Obstructive sleep apnea treated with continuous positive airway  pressure (CPAP) 01/28/2018  . OSA (obstructive sleep apnea) 08/27/2017  . Excessive daytime sleepiness 08/27/2017  . Paronychia of toe 06/16/2016  . Chronic sphenoidal sinusitis 08/13/2015  . Migraine 11/22/2014  . Sleep apnea 11/22/2014  . Abnormal MRI of head 07/04/2013  . Headache(784.0) 03/29/2013  . Peripheral neuropathy   . Major depressive disorder   . Abnormal mammogram 06/10/2012    Nancy Nordmann, PT, DPT 12/30/2018, 10:56 PM  [facility] [address], [place], [address] Phone: [phone]   Fax:  [phone]  Name: Pamela Hendricks MRN: [id number] Date of Birth: [birth year]

## 2019-01-03 ENCOUNTER — Telehealth: Payer: Self-pay | Admitting: Physical Therapy

## 2019-01-03 ENCOUNTER — Ambulatory Visit: Payer: Medicare Other | Admitting: Physical Therapy

## 2019-01-03 NOTE — Telephone Encounter (Signed)
Called pt when she did not show up for her appointment today at 10:30 am. Pt said she thought it was tomorrow, as she usually comes on Tuesdays and forgot she had one on Monday. She was appreciative of the call and rescheduled the appt to tomorrow (Tuesday) at 11:15am). I notified front office and requested her appt be moved to tomorrow at 11:15am.

## 2019-01-04 ENCOUNTER — Encounter: Payer: Medicare Other | Admitting: Physical Therapy

## 2019-01-06 ENCOUNTER — Encounter: Payer: Self-pay | Admitting: Physical Therapy

## 2019-01-06 ENCOUNTER — Ambulatory Visit: Payer: Medicare Other | Admitting: Physical Therapy

## 2019-01-06 DIAGNOSIS — M25561 Pain in right knee: Secondary | ICD-10-CM

## 2019-01-06 DIAGNOSIS — R262 Difficulty in walking, not elsewhere classified: Secondary | ICD-10-CM

## 2019-01-06 DIAGNOSIS — M25661 Stiffness of right knee, not elsewhere classified: Secondary | ICD-10-CM

## 2019-01-06 DIAGNOSIS — M6281 Muscle weakness (generalized): Secondary | ICD-10-CM

## 2019-01-06 NOTE — Therapy (Signed)
[facility] [address] [place], [address] Phone: [phone]   Fax:  [phone]  Physical Therapy Discharge Summary Reporting Period: 11/11/2018 - 01/06/2019  Patient Details  Name: Pamela Hendricks MRN: [id number] Date of Birth: 04-Aug-1946 Referring Provider (PT): Melrose Nakayama, MD   Encounter Date: 01/06/2019    Past Medical History:  Diagnosis Date  . Arthritis   . Carotid artery disease ([place])    a. duplex 07/2017 - 1-39% stenosis bilaterally.  . Environmental and seasonal allergies   . GERD (gastroesophageal reflux disease)   . Hyperlipidemia   . Hypertension   . Migraines    sinus headaches, no longer having migraines  . OSA (obstructive sleep apnea)    CPAP  . Peripheral neuropathy   . Peripheral vascular disease ([place])    carotid blockage - is followed by Dr. Marlou Porch  . Snores   . Wears glasses     Past Surgical History:  Procedure Laterality Date  . ABDOMINAL HYSTERECTOMY  1970   Total HYST  . BREAST BIOPSY Bilateral 2001   benign  . BREAST EXCISIONAL BIOPSY Right 2013   sclerosing lesion  . BREAST LUMPECTOMY WITH RADIOACTIVE SEED LOCALIZATION Left 08/31/2018   Procedure: BREAST LUMPECTOMY WITH RADIOACTIVE SEED LOCALIZATION;  Surgeon: Fanny Skates, MD;  Location: [facility];  Service: General;  Laterality: Left;  . COLONOSCOPY    . JOINT REPLACEMENT Left 2001   Left Knee Replacement  . KNEE ARTHROSCOPY Left    lt  . NASAL SINUS SURGERY    . TOTAL KNEE ARTHROPLASTY Right 10/12/2018   Procedure: TOTAL KNEE ARTHROPLASTY;  Surgeon: Melrose Nakayama, MD;  Location: [facility];  Service: Orthopedics;  Laterality: Right;  . WISDOM TOOTH EXTRACTION      There were no vitals filed for this visit.  Subjective Assessment - 01/06/19 1523    Subjective  Patient called front office today and requested discharge and to cancel future appts.     Pertinent History  Patient is a 73 y.o. female who  presents to outpatient physical therapy with a referral for medical diagnosis s/p R TKR performed 10/12/2018. This patient's chief complaints consist of pain, stiffness, swelling, weakness, reduced activity tolerance, and difficulty walking, leading to the following functional deficits difficulty with usual ADLs, IADLs, household and community mobility, sleeping, and usual community and volunteer activities.Relevant past medical history and comorbidities include history of left TKA, peripheral neuropathy, sleep apnea, headaches, carotid artery stenosis 39% bilaterally in 07/2017, history of left hammer toe.     Limitations  Sitting;Standing;House hold activities;Walking;Lifting    How long can you sit comfortably?  chair < 15 min    How long can you stand comfortably?  < 5 min    How long can you walk comfortably?  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    Pain Onset  More than a month ago       OBJECTIVE: Pt not present at discharge. Please see previous documentation for latest objective measures.      PT Short Term Goals - 12/30/18 2247      PT SHORT TERM GOAL #1   Title  Be independent with initial home exercise program for self-management of symptoms.    Baseline  Initial OP HEP to be provided visit 2.     Time  2    Period  Weeks  Status  Achieved    Target Date  11/26/18      PT SHORT TERM GOAL #2   Title  Patient will be able to ascend and descend four 6-inch steps with step over step gait pattern and no UE support to improve functional mobility.     Baseline  see objective exam from 11/11/2018; uses unilateral UE support (12/30/2018):     Time  2    Period  Weeks    Status  Partially Met    Target Date  01/13/19      PT SHORT TERM GOAL #3   Title  Patient will be able to return to driving without limitations from right knee surgery to improve independence with IADLs.     Baseline  Not yet  driving (07/15/6386);     Time  4    Period  Weeks    Status  Achieved    Target Date  12/10/18      PT SHORT TERM GOAL #4   Title  Improve right knee AROM to equal or greater than 0 degrees extension and 110 flexion to allow patient to complete valued activities with less difficulty.     Baseline  see objective exam (11/11/2018, 12/30/2018)    Time  4    Period  Weeks    Status  Partially Met    Target Date  01/27/19      PT SHORT TERM GOAL #5   Title  Improve right hip abduction strength to 3+/5 for improved stability in SLS to allow patient to complete valued functional tasks such as walking, climbing/descending stairs, and getting in and out of care safely.    Baseline  MMT = 2/5 (11/11/2018)    Time  4    Period  Weeks    Status  Achieved    Target Date  12/10/18        PT Long Term Goals - 12/30/18 2248      PT LONG TERM GOAL #1   Title  Be independent with a long-term home exercise program for self-management of symptoms    Time  8    Period  Weeks    Status  Partially Met    Target Date  01/27/19      PT LONG TERM GOAL #2   Title  Demonstrate improved FOTO score by 10 units to demonstrate improvement in overall condition and self-reported functional ability.     Baseline  did not assess until visit 10 when equald 43 (12/30/2018)    Time  8    Period  Weeks    Status  Unable to assess    Target Date  01/27/19      PT LONG TERM GOAL #3   Title  Improve right knee PROM to equal or greater than 125 degrees flexion to allow patient to complete valued activities with less difficulty.     Baseline  98 degrees (11/11/2018); 110 (12/30/2018);     Time  8    Period  Weeks    Status  Partially Met    Target Date  01/27/19      PT LONG TERM GOAL #4   Title  Improve B hip  strength to equal or greater than 4+/5 and bilateral knee strength to 5/5 for improved ability to complete valued functional tasks such as walking, climbing/descending stairs, community activities.    Baseline   R knee flex and ext both MMT = 4/5 (11/11/2017). progressing (12/30/2018);  Time  8    Period  Weeks    Status  Partially Met    Target Date  01/27/19      PT LONG TERM GOAL #5   Title  Complete community, work and/or recreational activities without limitation due to current condition.     Time  8    Period  Weeks    Status  Partially Met    Target Date  01/27/19            Plan - 01/06/19 1529    Clinical Impression Statement  Patient has attended 10 physical therapy visits and a progress note was performed at her last treatment session. She did not return for further care following that visit and has requested self-discharge. She made progress towards most goals and met about 50% of her goals over the course of her treatment. She did not achieve full extension or more than 110 degrees of flexion that will leave her at risk for tripping and difficulty with stairs. She also did not achieve a normal gait pattern and continues to ambulate with antalgic gait favoring affected side. It was recommended that she continue physical therapy to improve these impairments and functional deficits to achieve best outcome. She is now being discharged due to request for self-discharge.     Rehab Potential  Good    Clinical Impairments Affecting Rehab Potential  (+) motivation, positive outlook, good response to PT so far; (-) multiple comorbidities, risk of infection, lives alone    PT Frequency  2x / week    PT Duration  4 weeks    PT Treatment/Interventions  ADLs/Self Care Home Management;Cryotherapy;Moist Heat;Gait training;Functional mobility training;Stair training;Therapeutic activities;Therapeutic exercise;Balance training;Neuromuscular re-education;Patient/family education;Manual techniques;Compression bandaging;Scar mobilization;Passive range of motion;Dry needling;Joint Manipulations;Other (comment)   joint mobilizations grades I-V   PT Next Visit Plan  Pt is now discharged due to  [place] Access Code: [email or web address]     Consulted and Agree with Plan of Care  Patient       Patient will benefit from skilled therapeutic intervention in order to improve the following deficits and impairments:  Abnormal gait, Decreased activity tolerance, Decreased endurance, Decreased range of motion, Decreased skin integrity, Decreased strength, Hypomobility, Impaired perceived functional ability, Impaired sensation, Improper body mechanics, Pain, Decreased balance, Decreased mobility, Decreased scar mobility, Difficulty walking, Increased edema, Increased muscle spasms, Impaired flexibility, Obesity, Postural dysfunction  Visit Diagnosis: Right knee pain, unspecified chronicity  Stiffness of right knee, not elsewhere classified  Muscle weakness (generalized)  Difficulty in walking, not elsewhere classified     Problem List Patient Active Problem List   Diagnosis Date Noted  . Primary osteoarthritis of right knee 10/12/2018  . Obstructive sleep apnea treated with continuous positive airway pressure (CPAP) 01/28/2018  . OSA (obstructive sleep apnea) 08/27/2017  . Excessive daytime sleepiness 08/27/2017  . Paronychia of toe 06/16/2016  . Chronic sphenoidal sinusitis 08/13/2015  . Migraine 11/22/2014  . Sleep apnea 11/22/2014  . Abnormal MRI of head 07/04/2013  . Headache(784.0) 03/29/2013  . Peripheral neuropathy   . Major depressive disorder   . Abnormal mammogram 06/10/2012    Nancy Nordmann, PT, DPT 01/06/2019, 3:30 PM  [facility] [address] [place], [address] Phone: [phone]   Fax:  [phone]  Name: Pamela Hendricks MRN: [id number] Date of Birth: 06-13-1946

## 2019-01-11 ENCOUNTER — Ambulatory Visit: Payer: Medicare Other | Admitting: Physical Therapy

## 2019-06-06 ENCOUNTER — Ambulatory Visit: Payer: Medicare Other | Admitting: Nurse Practitioner

## 2019-06-14 ENCOUNTER — Ambulatory Visit: Payer: Medicare Other | Admitting: Adult Health

## 2019-06-16 ENCOUNTER — Ambulatory Visit: Payer: Medicare Other | Attending: Orthopaedic Surgery | Admitting: Physical Therapy

## 2019-06-16 ENCOUNTER — Other Ambulatory Visit: Payer: Self-pay

## 2019-06-16 DIAGNOSIS — M25561 Pain in right knee: Secondary | ICD-10-CM | POA: Insufficient documentation

## 2019-06-16 DIAGNOSIS — R262 Difficulty in walking, not elsewhere classified: Secondary | ICD-10-CM | POA: Diagnosis present

## 2019-06-16 DIAGNOSIS — M6281 Muscle weakness (generalized): Secondary | ICD-10-CM | POA: Diagnosis present

## 2019-06-16 DIAGNOSIS — M25661 Stiffness of right knee, not elsewhere classified: Secondary | ICD-10-CM | POA: Insufficient documentation

## 2019-06-16 NOTE — Therapy (Signed)
[facility] [address] [place], [address] Phone: [phone]   Fax:  [phone]  Physical Therapy Evaluation  Patient Details  Name: Pamela Hendricks MRN: [id number] Date of Birth: [birth year] Referring Provider (PT): Hessie Dibble, MD   Encounter Date: 06/16/2019  PT End of Session - 06/17/19 1316    Visit Number  1    Number of Visits  12    Date for PT Re-Evaluation  07/29/19    Authorization Type  [employer] Medicare    Authorization Time Period  Current Cert Period: 05/12/4192 - 07/29/2019 (latest PN: IE 06/16/2019)    Authorization - Visit Number  1    Authorization - Number of Visits  10    PT Start Time  0945    PT Stop Time  1045    PT Time Calculation (min)  60 min    Activity Tolerance  Patient tolerated treatment well    Behavior During Therapy  [facility] for tasks assessed/performed;Anxious       Past Medical History:  Diagnosis Date  . Arthritis   . Carotid artery disease ([place])    a. duplex 07/2017 - 1-39% stenosis bilaterally.  . Environmental and seasonal allergies   . GERD (gastroesophageal reflux disease)   . Hyperlipidemia   . Hypertension   . Migraines    sinus headaches, no longer having migraines  . OSA (obstructive sleep apnea)    CPAP  . Peripheral neuropathy   . Peripheral vascular disease ([place])    carotid blockage - is followed by Dr. Marlou Porch  . Snores   . Wears glasses     Past Surgical History:  Procedure Laterality Date  . ABDOMINAL HYSTERECTOMY  1970   Total HYST  . BREAST BIOPSY Bilateral 2001   benign  . BREAST EXCISIONAL BIOPSY Right 2013   sclerosing lesion  . BREAST LUMPECTOMY WITH RADIOACTIVE SEED LOCALIZATION Left 08/31/2018   Procedure: BREAST LUMPECTOMY WITH RADIOACTIVE SEED LOCALIZATION;  Surgeon: Fanny Skates, MD;  Location: [facility];  Service: General;  Laterality: Left;  . COLONOSCOPY    . JOINT REPLACEMENT Left 2001   Left Knee Replacement  . KNEE  ARTHROSCOPY Left    lt  . NASAL SINUS SURGERY    . TOTAL KNEE ARTHROPLASTY Right 10/12/2018   Procedure: TOTAL KNEE ARTHROPLASTY;  Surgeon: Melrose Nakayama, MD;  Location: [facility];  Service: Orthopedics;  Laterality: Right;  . WISDOM TOOTH EXTRACTION      There were no vitals filed for this visit.   Subjective Assessment - 06/16/19 1005    Subjective  Patient reports she is returning to physical therapy per the recommendation of her doctor. She reports that since her last episode of care she has been doing her HEP. She states her doctor sent her here to ride the bike more and because she is "wobbly." She states the only reason she is here is that she doesn't have a bike at home to ride. She states she does not need "pushing and pulling" on her knee and is hesitant to do exercise. She reports her L shoulder is hurting and surgery is being planned for it. She states her L knee is doing great. Her R knee is the one bothering her the most. Patient is known to this clinician and participated in an episode of care for her R knee earlier this year following R TKA on 10/13/2019 performed due to gradual worsening of pain and dysfunction in that  knee over time. She had difficulty with participation during that episode of care due to pain dominant presentation. She feels she is doing much better now but lacks some ROM and feels "wobbly." She states her doctor informed her her knee will eventually regain motion with time.    Pertinent History  Patient is a 73 y.o. female who presents to outpatient physical therapy with a referral for medical diagnosis B TKA. This patient's chief complaints consist of right knee pain, stiffness, and "wobbly" gait leading to the following functional deficits: difficulty with walking, decreased activity tolerance, usual IADLs, community and volunteer activities, quality of life. Relevant past medical history and comorbidities include L 2nd toe removal and surgery in 2018, arthritis in  hands, history of left TKA, peripheral neuropathy, sleep apnea, headaches, carotid artery stenosis 1-39% bilaterally in 07/2017, obesity, inadequate level of physical activity.    Limitations  Sitting;Lifting;Standing;Walking;House hold activities;Other (comment)   community and volunteer activities.   How long can you sit comfortably?  30-40 minutes    How long can you stand comfortably?  30 min (then it gives out)    How long can you walk comfortably?  10 minutes holding on to a buggy    Diagnostic tests  pt reports recent radiographs show good placement and condition of prothesis in R knee and no need for manipulation.    Patient Stated Goals  wants to walk more and walk faster and have no pain.    Currently in Pain?  Yes    Pain Score  4    patient states it is not bad currently; best 4/10, at worst 8/10. Denies paresthesia but also confirms peripheral neuropathy   Pain Location  Knee    Pain Orientation  Right;Anterior   inside joint   Pain Descriptors / Indicators  Tightness    Pain Type  Chronic pain;Surgical pain    Pain Onset  1 to 4 weeks ago    Pain Frequency  Constant    Aggravating Factors   when touching the anterior R knee, when exercising, walking up and down steps, end range movements, getting up the couch and walking.    Pain Relieving Factors  not touching it, being more still    Effect of Pain on Daily Activities  difficulty with walking fast, decreased activity tolerance, usual IADLs, community and volunteer activities, quality of life        [facility] PT Assessment - 06/17/19 0001      Assessment   Medical Diagnosis  Bilateral TKR    Referring Provider (PT)  Hessie Dibble, MD    Onset Date/Surgical Date  10/12/18    Next MD Visit  unsure what date      Precautions   Precautions  None      Restrictions   Weight Bearing Restrictions  No      [place] residence    Living Arrangements  Alone    Type of [place] to enter    Entrance Stairs-Number of Steps  2    [place]  One level      Prior Function   Level of [place]  Retired    Leisure  gardening, shopping, walking,,       Cognition   Overall Cognitive Status  Within Functional Limits for tasks assessed  Observation/Other Assessments   Observations  see note from 06/16/2019    Focus on Therapeutic Outcomes (FOTO)   53      6 minute walk test results    Aerobic Endurance Distance Walked  1300    Endurance additional comments  6MWT: 1300 feet, very short of breath following, decreased speed partway through due to poor endurance      Standardized Balance Assessment   Five times sit to stand comments   11 seconds no UE support and 18.5 inch surface      Functional Gait  Assessment   Gait Level Surface  Walks 20 ft in less than 5.5 sec, no assistive devices, good speed, no evidence for imbalance, normal gait pattern, deviates no more than 6 in outside of the 12 in walkway width.    Change in Gait Speed  Able to smoothly change walking speed without loss of balance or gait deviation. Deviate no more than 6 in outside of the 12 in walkway width.    Gait with Horizontal Head Turns  Performs head turns smoothly with no change in gait. Deviates no more than 6 in outside 12 in walkway width    Gait with Vertical Head Turns  Performs head turns with no change in gait. Deviates no more than 6 in outside 12 in walkway width.    Gait and Pivot Turn  Pivot turns safely within 3 sec and stops quickly with no loss of balance.    Step Over Obstacle  Is able to step over one shoe box (4.5 in total height) but must slow down and adjust steps to clear box safely. May require verbal cueing.    Gait with Narrow Base of Support  Ambulates 7-9 steps.    Gait with Eyes Closed  Walks 20 ft, slow speed, abnormal gait pattern, evidence for imbalance, deviates 10-15 in outside 12 in walkway  width. Requires more than 9 sec to ambulate 20 ft.    Ambulating Backwards  Walks 20 ft, uses assistive device, slower speed, mild gait deviations, deviates 6-10 in outside 12 in walkway width.    Steps  Alternating feet, must use rail.    Total Score  23    FGA comment:  19-24 = medium risk fall        OBJECTIVE: OBSERVATION/INSPECTION: Patient presents with BLE edema that appears to be baseline. R incision site appears to have healed well with maturing scar appropriate for time since surgery.   PERIPHERAL JOINT MOTION (AROM/PROM in degrees):  *Indicates pain  Hip = WFL except ERP in ER; Restricted R hip with knee flexion (rectus femoris shortness) Knee - Flexion: R = 110/112 semi-firm end feel, painful; L = 116/116 firm and tender end feel.  - Extension: R = -10, L = -5. Ankle = WFL for basic function  STRENGTH:  *Indicates pain Hip  - Flexion: R = 5/5, L = 4+/5. - Extension: R = 4/5, L = 4/5. (knee flexed less ROM) - Abduction: R = 4+/5, L = 4/5. Knee - Ext: R = 4/5*, L = 5/5. - Flex: R = 3/5, L = 4/5. Ankle (seated position) - Dorsiflexion: R = 5/5, L = 5/5. - Eversion: R = 5/5, L = 5/5. - Able to heel walk and toe walk with BUE support.  ACCESSORY MOTION:  - Deferred per pt preference  PALPATION: - Deferred per pt preference  FUNCTIONAL MOBILITY: - Bed mobility: sit to supine and rolling I - Transfers: sit <> stand I  -  Gait: WFL for household and short community distances. Slight side to side lean. Very out of breath after 6MTW and needed to slow down pace part way through due to poor endurance.  - Stairs: Able to ascend and descend 4 steps with BUE support on handrails with step over step gait. Reports apprehension and discomfort while stepping down with left foot.   FUNCTIONAL/BALANCE TESTS:  5TSTS: 11 seconds no UE support and 18.5 inch surface  Six Minute Walk Test: 1300 feet, very short of breath following, decreased speed partway through due to poor  endurance  Functional Gait Assessment: 23/30 (19-24 = medium risk fall)  Romberg test:  -Narrow stance, eyes open: 30 seconds  -Narrow stance, eyes closed: 30 seconds  Sharpened Romberg test:  -Tandem stance, eyes open: 30 seconds each side  -Tandem stance, eyes closed: R front = 9 second; L front = 4 seconds  Single leg stance, firm surface, eyes open: R= 30 seconds increased sway, L= 30 seconds  EDUCATION/COGNITION: Patient is alert and oriented X 4.  Objective measurements completed on examination: See above findings.    TREATMENT:   Therapeutic exercise: to centralize symptoms and improve ROM, strength, muscular endurance, and activity tolerance required for successful completion of functional activities.   Ambulation around clinic x6 min with supervision. For improved lower extremity mobility, muscular endurance, and weightbearing activity tolerance; and to induce the analgesic effect of aerobic exercise, stimulate improved joint nutrition, and prepare body structures and systems for following interventions. Out of breath at conclusion and had to slow down.   Education on HEP including handout   Education on diagnosis, prognosis, POC, anatomy and physiology of current condition.   Patient response to treatment:  Pt tolerated treatment well. Pt was able to complete all exercises with minimal to no lasting increase in pain or discomfort. She demonstrated significant decrease in activity tolerance and endurance for fast ambulation, which is one of her primary goals. Voiced understanding and agreement of plan of care. Agreed to continue her current HEP until next visit.    PT Education - 06/17/19 1315    Education Details  Exercise purpose/form. Self management techniques. Education on diagnosis, prognosis, POC, anatomy and physiology of current condition Education on HEP    Person(s) Educated  Patient    Methods  Explanation;Demonstration;Tactile cues;Verbal cues     Comprehension  Verbalized understanding;Returned demonstration;Verbal cues required       PT Short Term Goals - 06/17/19 1342      PT SHORT TERM GOAL #1   Title  Be independent with appropriate home exercise program for self-management of symptoms.    Baseline  HEP to be updated at visit 2 (06/16/2019);    Time  2    Period  Weeks    Status  New    Target Date  07/01/19        PT Long Term Goals - 06/17/19 1332      PT LONG TERM GOAL #1   Title  Be independent with a long-term home exercise program for self-management of symptoms    Baseline  HEP to be updated at visit 2 (06/16/2019);    Time  6    Period  Weeks    Status  New    Target Date  07/29/19      PT LONG TERM GOAL #2   Title  Demonstrate improved FOTO score by 10 units to demonstrate improvement in overall condition and self-reported functional ability.     Baseline  FOTO = 53 (06/16/2019);    Time  6    Period  Weeks    Status  New    Target Date  07/29/19      PT LONG TERM GOAL #3   Title  Improve right knee PROM to equal or greater than 0-125 degrees flexion to allow patient to complete valued activities with less difficulty.    Baseline  10-0-112 (06/16/2019);    Time  6    Period  Weeks    Status  New    Target Date  07/29/19      PT LONG TERM GOAL #4   Title  Patient will increase 6 Minute Walk Test distance to equal or greater than 1545 feet to meet norm for community dwelling older adults age 73-79 years to improve functional activity tolerance and meet patient's goal of walking faster and further.    Baseline  1300 feet, very short of breath following, decreased speed partway through due to poor endurance (06/16/2019);    Time  6    Period  Weeks    Status  New    Target Date  07/29/19      PT LONG TERM GOAL #5   Title  [place] will improve Functional Gait Assessment score to equal or greater than 25/30 to show low fall risk and improved balance for functional activities.    Baseline  23/30 (19-24 =  medium risk fall, 06/16/2019);    Time  6    Period  Weeks    Status  New    Target Date  07/29/19      Additional Long Term Goals   Additional Long Term Goals  Yes      PT LONG TERM GOAL #6   Title  Patient will improve static balance in tandem stance with eyes closed to equal or greater than 30 seconds to demonstrate improved balance for functional activities such as need for narrow steps in the dark.    Baseline  R front = 9 second; L front = 4 seconds (06/16/2019);    Time  6    Period  Weeks    Status  New    Target Date  07/29/19      PT LONG TERM GOAL #7   Title  Patient will ascend and descend 4 steps with step over step gait with no UE support to improve ability to navigate stairs while carrying objects with both hands during daily activities.    Baseline  Able to ascend and descend 4 steps with BUE support on handrails with step over step gait. Reports apprehension and discomfort while stepping down with left foot. (06/16/2019);    Time  6    Period  Weeks    Status  New    Target Date  07/29/19      PT LONG TERM GOAL #8   Title  Complete community, work and/or recreational activities without limitation due to current condition.    Baseline  difficulty with walking, decreased activity tolerance, usual IADLs, community and volunteer activities, quality of life (06/16/2019);    Time  6    Period  Weeks    Status  New    Target Date  07/29/19        Plan - 06/17/19 1344    Clinical Impression Statement  Patient is a 73 y.o. female referred to outpatient physical therapy with a medical diagnosis of B TKA who presents with signs and symptoms consistent with continued stiffness,  pain and decreased function s/p R TKA 10/2017, lack of full ROM following L TKA several years ago, and generalized deconditioning. Patient presents with significant ROM, joint stiffness, pain, activity tolerance, strength/endurance, motor control, and balance impairments that are limiting ability to complete  walking, usual IADLs, community and volunteer activities without difficulty and negatively affects quality of life. Patient will benefit from skilled physical therapy intervention to address current body structure impairments and activity limitations to improve function and work towards goals set in current POC in order to return to prior level of function or maximal functional improvement.    Personal Factors and Comorbidities  Age;Behavior Pattern;Comorbidity 3+;Fitness;Past/Current Experience;Time since onset of injury/illness/exacerbation    Comorbidities  L 2nd toe removal and surgery in 2018, arthritis in hands, history of left TKA, peripheral neuropathy, sleep apnea, headaches, carotid artery stenosis 1-39% bilaterally in 07/2017, obesity, inadequate level of physical activity.    Examination-Activity Limitations  Squat;Lift;Sit;Transfers;Dressing;Stand;Stairs;Bend;Other   difficulty with walking, decreased activity tolerance, usual IADLs, community and volunteer activities, quality of life.   Examination-Participation Restrictions  Cleaning;Community Activity;Yard Work;Volunteer;Shop    Stability/Clinical Decision Making  Stable/Uncomplicated    Clinical Decision Making  Low    Rehab Potential  Good    PT Frequency  2x / week    PT Duration  6 weeks    PT Treatment/Interventions  ADLs/Self Care Home Management;Cryotherapy;Moist Heat;Gait training;Therapeutic activities;Therapeutic exercise;Balance training;Neuromuscular re-education;Functional mobility training;Stair training;Patient/family education;Manual techniques;Scar mobilization;Passive range of motion;Dry needling;Joint Manipulations;Spinal Manipulations;Other (comment)   joint mobilizations grades I-IV   PT Next Visit Plan  update HEP to be appropriate for current functional level; progressive LE/functional strengthening and stretching as tolerated    PT Home Exercise Plan  to be updated next session. currently performing HEP from prior  episode of care    Consulted and Agree with Plan of Care  Patient       Patient will benefit from skilled therapeutic intervention in order to improve the following deficits and impairments:  Abnormal gait, Decreased balance, Decreased endurance, Decreased mobility, Difficulty walking, Hypomobility, Cardiopulmonary status limiting activity, Decreased range of motion, Decreased scar mobility, Increased edema, Impaired perceived functional ability, Obesity, Decreased activity tolerance, Decreased coordination, Decreased strength, Impaired flexibility, Pain  Visit Diagnosis: 1. Right knee pain, unspecified chronicity   2. Stiffness of right knee, not elsewhere classified   3. Muscle weakness (generalized)   4. Difficulty in walking, not elsewhere classified        Problem List Patient Active Problem List   Diagnosis Date Noted  . Primary osteoarthritis of right knee 10/12/2018  . Obstructive sleep apnea treated with continuous positive airway pressure (CPAP) 01/28/2018  . OSA (obstructive sleep apnea) 08/27/2017  . Excessive daytime sleepiness 08/27/2017  . Paronychia of toe 06/16/2016  . Chronic sphenoidal sinusitis 08/13/2015  . Migraine 11/22/2014  . Sleep apnea 11/22/2014  . Abnormal MRI of head 07/04/2013  . Headache(784.0) 03/29/2013  . Peripheral neuropathy   . Major depressive disorder   . Abnormal mammogram 06/10/2012    Everlean Alstrom. Graylon Good, PT, DPT 06/17/19, 1:55 PM  [facility] [address] [place], [address] Phone: [phone]   Fax:  [phone]  Name: ZOILA DITULLIO MRN: [id number] Date of Birth: 1946-07-08

## 2019-06-17 ENCOUNTER — Encounter: Payer: Self-pay | Admitting: Physical Therapy

## 2019-06-20 ENCOUNTER — Ambulatory Visit: Payer: Medicare Other | Admitting: Physical Therapy

## 2019-06-21 ENCOUNTER — Ambulatory Visit: Payer: Medicare Other | Admitting: Physical Therapy

## 2019-06-23 ENCOUNTER — Telehealth: Payer: Self-pay | Admitting: Neurology

## 2019-06-23 ENCOUNTER — Encounter: Payer: Self-pay | Admitting: Physical Therapy

## 2019-06-23 ENCOUNTER — Ambulatory Visit: Payer: Medicare Other | Admitting: Physical Therapy

## 2019-06-23 ENCOUNTER — Other Ambulatory Visit: Payer: Self-pay

## 2019-06-23 ENCOUNTER — Telehealth: Payer: Self-pay | Admitting: Physical Therapy

## 2019-06-23 DIAGNOSIS — R262 Difficulty in walking, not elsewhere classified: Secondary | ICD-10-CM

## 2019-06-23 DIAGNOSIS — M6281 Muscle weakness (generalized): Secondary | ICD-10-CM

## 2019-06-23 DIAGNOSIS — M25561 Pain in right knee: Secondary | ICD-10-CM

## 2019-06-23 DIAGNOSIS — M25661 Stiffness of right knee, not elsewhere classified: Secondary | ICD-10-CM

## 2019-06-23 NOTE — Telephone Encounter (Signed)
°   I had a call from Pamela Hendricks 01-06-2045. she said when she puts the cap on to her CPAP machine all she hears is air. she said the people she got it from said a doctor needs to adjust it. she would like for someone to call her back today please thanks dg

## 2019-06-23 NOTE — Therapy (Signed)
[facility] [address] [place], [address] Phone: [phone]   Fax:  [phone]  Physical Therapy Treatment  Patient Details  Name: CATHERYNE DEFORD MRN: [id number] Date of Birth: 13-Dec-1945 Referring Provider (PT): Hessie Dibble, MD   Encounter Date: 06/23/2019  PT End of Session - 06/23/19 1747    Visit Number  2    Number of Visits  12    Date for PT Re-Evaluation  07/29/19    Authorization Type  [employer]    Authorization Time Period  Current Cert Period: 01/11/5455 - 07/29/2019 (latest PN: IE 06/16/2019)    Authorization - Visit Number  2    Authorization - Number of Visits  10    PT Start Time  1630    PT Stop Time  1710    PT Time Calculation (min)  40 min    Activity Tolerance  Patient tolerated treatment well    Behavior During Therapy  [facility] for tasks assessed/performed;Anxious       Past Medical History:  Diagnosis Date  . Arthritis   . Carotid artery disease ([place])    a. duplex 07/2017 - 1-39% stenosis bilaterally.  . Environmental and seasonal allergies   . GERD (gastroesophageal reflux disease)   . Hyperlipidemia   . Hypertension   . Migraines    sinus headaches, no longer having migraines  . OSA (obstructive sleep apnea)    CPAP  . Peripheral neuropathy   . Peripheral vascular disease ([place])    carotid blockage - is followed by Dr. Marlou Porch  . Snores   . Wears glasses     Past Surgical History:  Procedure Laterality Date  . ABDOMINAL HYSTERECTOMY  1970   Total HYST  . BREAST BIOPSY Bilateral 2001   benign  . BREAST EXCISIONAL BIOPSY Right 2013   sclerosing lesion  . BREAST LUMPECTOMY WITH RADIOACTIVE SEED LOCALIZATION Left 08/31/2018   Procedure: BREAST LUMPECTOMY WITH RADIOACTIVE SEED LOCALIZATION;  Surgeon: Fanny Skates, MD;  Location: [facility];  Service: General;  Laterality: Left;  . COLONOSCOPY    . JOINT REPLACEMENT Left 2001   Left Knee Replacement  . KNEE  ARTHROSCOPY Left    lt  . NASAL SINUS SURGERY    . TOTAL KNEE ARTHROPLASTY Right 10/12/2018   Procedure: TOTAL KNEE ARTHROPLASTY;  Surgeon: Melrose Nakayama, MD;  Location: [facility];  Service: Orthopedics;  Laterality: Right;  . WISDOM TOOTH EXTRACTION      There were no vitals filed for this visit.  Subjective Assessment - 06/23/19 1745    Subjective  Patient reports she is feeling well today, a bit tired and doesn't want to do too much. States she has a bit of soreness in the knee but no actual pain. Reports she was very fatigued following last treatment session but no extra pain. Would like to work on feeling wobbly during walking, coming down stairs, and being able to get up and walk without feeling like her legs will give out.    Pertinent History  Patient is a 73 y.o. female who presents to outpatient physical therapy with a referral for medical diagnosis B TKA. This patient's chief complaints consist of right knee pain, stiffness, and "wobbly" gait leading to the following functional deficits: difficulty with walking, decreased activity tolerance, usual IADLs, community and volunteer activities, quality of life. Relevant past medical history and comorbidities include L 2nd toe removal and surgery in 2018, arthritis in hands, history of  left TKA, peripheral neuropathy, sleep apnea, headaches, carotid artery stenosis 1-39% bilaterally in 07/2017, obesity, inadequate level of physical activity.    Limitations  Sitting;Lifting;Standing;Walking;House hold activities;Other (comment)   community and volunteer activities.   How long can you sit comfortably?  30-40 minutes    How long can you stand comfortably?  30 min (then it gives out)    How long can you walk comfortably?  10 minutes holding on to a buggy    Diagnostic tests  pt reports recent radiographs show good placement and condition of prothesis in R knee and no need for manipulation.    Patient Stated Goals  wants to walk more and walk faster  and have no pain.    Currently in Pain?  Yes    Pain Score  1     Pain Onset  1 to 4 weeks ago          TREATMENT:   Therapeutic exercise:to centralize symptoms and improve ROM, strength, muscular endurance, and activity tolerance required for successful completion of functional activities.   Recumbent Bike no resistance with seat position 7 (one position back from where pt initially felt discomfort in flexion) x 5 minutes with free rotation to warm up joint and improve motion to prepare for stretching and exercise, then moved to seat position 3 (where patient felt some tolerable stretching discomfort) for 5 more minutes to improve motoin . For improved lower extremity ROM, muscular endurance, and activity tolerance; and to induce the analgesic effect of aerobic exercise, stimulate joint nutrition, and prepare body structures and systems for following interventions. Completed subjective exam during this time.   Standing hip abduction at hip machine x10 each side at 25#, cuing for form. Patient found it was quite challenging.   Heel raises off edge of 2x4 board with BUE support, x10, to improve ankle strength for balance  Toe raises of edge of 2x4 board, BUE support, x10 to improve ankle strength for balance  Step downs facing out on 6 inch step with BUE support, x 10 each side to improve ability to descend stairs.   Neuromuscular Re-education: to improve, balance, postural strength, muscle activation patterns, and stabilization strength required for functional activities:  6 inch hurdle navigation with SBA - Min A to prevent falls: step to gait forward x 24 each side, step to alternating steps forward x 12, step over step forward x 36, side stepping 2x6 each side, backwards step too pattern 2x12 leading with each leg. Patient with two episodes of LOB that required min A to prevent falls. Knocked over several hurdles going backwards and very apprehensive.   Ball toss in tandem stance on  firm surface with SBA - CGA using 2kg ball at rebounder to improve static balance. 2x10 each foot forwards.   Tandem walking on 6 foot 2x4 board on the ground, CGA, x10     PT Education - 06/23/19 1747    Education Details  Exercise purpose/form. Self management techniques.    Person(s) Educated  Patient    Methods  Explanation;Demonstration;Verbal cues    Comprehension  Verbalized understanding;Returned demonstration       PT Short Term Goals - 06/17/19 1342      PT SHORT TERM GOAL #1   Title  Be independent with appropriate home exercise program for self-management of symptoms.    Baseline  HEP to be updated at visit 2 (06/16/2019);    Time  2    Period  Weeks    Status  New    Target Date  07/01/19        PT Long Term Goals - 06/17/19 1332      PT LONG TERM GOAL #1   Title  Be independent with a long-term home exercise program for self-management of symptoms    Baseline  HEP to be updated at visit 2 (06/16/2019);    Time  6    Period  Weeks    Status  New    Target Date  07/29/19      PT LONG TERM GOAL #2   Title  Demonstrate improved FOTO score by 10 units to demonstrate improvement in overall condition and self-reported functional ability.     Baseline  FOTO = 53 (06/16/2019);    Time  6    Period  Weeks    Status  New    Target Date  07/29/19      PT LONG TERM GOAL #3   Title  Improve right knee PROM to equal or greater than 0-125 degrees flexion to allow patient to complete valued activities with less difficulty.    Baseline  10-0-112 (06/16/2019);    Time  6    Period  Weeks    Status  New    Target Date  07/29/19      PT LONG TERM GOAL #4   Title  Patient will increase 6 Minute Walk Test distance to equal or greater than 1545 feet to meet norm for community dwelling older adults age 64-79 years to improve functional activity tolerance and meet patient's goal of walking faster and further.    Baseline  1300 feet, very short of breath following, decreased speed  partway through due to poor endurance (06/16/2019);    Time  6    Period  Weeks    Status  New    Target Date  07/29/19      PT LONG TERM GOAL #5   Title  Patinet will improve Functional Gait Assessment score to equal or greater than 25/30 to show low fall risk and improved balance for functional activities.    Baseline  23/30 (19-24 = medium risk fall, 06/16/2019);    Time  6    Period  Weeks    Status  New    Target Date  07/29/19      Additional Long Term Goals   Additional Long Term Goals  Yes      PT LONG TERM GOAL #6   Title  Patient will improve static balance in tandem stance with eyes closed to equal or greater than 30 seconds to demonstrate improved balance for functional activities such as need for narrow steps in the dark.    Baseline  R front = 9 second; L front = 4 seconds (06/16/2019);    Time  6    Period  Weeks    Status  New    Target Date  07/29/19      PT LONG TERM GOAL #7   Title  Patient will ascend and descend 4 steps with step over step gait with no UE support to improve ability to navigate stairs while carrying objects with both hands during daily activities.    Baseline  Able to ascend and descend 4 steps with BUE support on handrails with step over step gait. Reports apprehension and discomfort while stepping down with left foot. (06/16/2019);    Time  6    Period  Weeks    Status  New  Target Date  07/29/19      PT LONG TERM GOAL #8   Title  Complete community, work and/or recreational activities without limitation due to current condition.    Baseline  difficulty with walking, decreased activity tolerance, usual IADLs, community and volunteer activities, quality of life (06/16/2019);    Time  6    Period  Weeks    Status  New    Target Date  07/29/19            Plan - 06/23/19 1923    Clinical Impression Statement  Patient tolerated treatment well. She has a history of getting very sore following physical therapy sessions, so reps were kept low  this session. Patient had some "stretching" discomfort with stair step down but really wanted to complete them due to relevance to her goal. Discussed options for HEP (step ups and sit <> stands) to help strengthen quads for this pattern. Pt demo significantly less control using R leg to control descent than left. Pt required multimodal cuing for proper technique and to facilitate improved neuromuscular control, strength, range of motion, and functional ability resulting in improved performance and form. Patient would benefit from continued physical therapy to address remaining impairments and functional limitations to work towards stated goals and return to PLOF or maximal functional independence.    Personal Factors and Comorbidities  Age;Behavior Pattern;Comorbidity 3+;Fitness;Past/Current Experience;Time since onset of injury/illness/exacerbation    Comorbidities  L 2nd toe removal and surgery in 2018, arthritis in hands, history of left TKA, peripheral neuropathy, sleep apnea, headaches, carotid artery stenosis 1-39% bilaterally in 07/2017, obesity, inadequate level of physical activity.    Examination-Activity Limitations  Squat;Lift;Sit;Transfers;Dressing;Stand;Stairs;Bend;Other   difficulty with walking, decreased activity tolerance, usual IADLs, community and volunteer activities, quality of life.   Examination-Participation Restrictions  Cleaning;Community Activity;Yard Work;Volunteer;Shop    Stability/Clinical Decision Making  Stable/Uncomplicated    Rehab Potential  Good    PT Frequency  2x / week    PT Duration  6 weeks    PT Treatment/Interventions  ADLs/Self Care Home Management;Cryotherapy;Moist Heat;Gait training;Therapeutic activities;Therapeutic exercise;Balance training;Neuromuscular re-education;Functional mobility training;Stair training;Patient/family education;Manual techniques;Scar mobilization;Passive range of motion;Dry needling;Joint Manipulations;Spinal Manipulations;Other  (comment)   joint mobilizations grades I-IV   PT Next Visit Plan  update HEP to be appropriate for current functional level; progressive LE/functional strengthening and stretching as tolerated    PT Home Exercise Plan  step- ups    Consulted and Agree with Plan of Care  Patient       Patient will benefit from skilled therapeutic intervention in order to improve the following deficits and impairments:  Abnormal gait, Decreased balance, Decreased endurance, Decreased mobility, Difficulty walking, Hypomobility, Cardiopulmonary status limiting activity, Decreased range of motion, Decreased scar mobility, Increased edema, Impaired perceived functional ability, Obesity, Decreased activity tolerance, Decreased coordination, Decreased strength, Impaired flexibility, Pain  Visit Diagnosis: 1. Right knee pain, unspecified chronicity   2. Stiffness of right knee, not elsewhere classified   3. Muscle weakness (generalized)   4. Difficulty in walking, not elsewhere classified        Problem List Patient Active Problem List   Diagnosis Date Noted  . Primary osteoarthritis of right knee 10/12/2018  . Obstructive sleep apnea treated with continuous positive airway pressure (CPAP) 01/28/2018  . OSA (obstructive sleep apnea) 08/27/2017  . Excessive daytime sleepiness 08/27/2017  . Paronychia of toe 06/16/2016  . Chronic sphenoidal sinusitis 08/13/2015  . Migraine 11/22/2014  . Sleep apnea 11/22/2014  . Abnormal MRI of head  07/04/2013  . Headache(784.0) 03/29/2013  . Peripheral neuropathy   . Major depressive disorder   . Abnormal mammogram 06/10/2012    Everlean Alstrom. Graylon Good, PT, DPT 06/23/19, 7:24 PM  [facility] [address], [place], [address] Phone: [phone]   Fax:  [phone]  Name: SKYLAR FLYNT MRN: [id number] Date of Birth: 03-Oct-1946

## 2019-06-23 NOTE — Telephone Encounter (Signed)
Called patient when she did not show up for her 9:45 appointment this morning. Patient answered and said she thought it was tomorrow (Friday). When reviewing future appointments with her, discovered earlier times had opened up on several of the days she was scheduled and I discussed and agreed with patient on new times on multiple days. Conveyed this information to front office staff. Patient was unsure if she could come later today because she thought she may be too tired after some other things she must do. She reported she had too much to do and was not feeling up to coming earlier in the week when she canceled her Monday appointment.   Pamela Hendricks. Graylon Good, PT, DPT 06/23/19, 10:22 AM

## 2019-06-27 ENCOUNTER — Ambulatory Visit: Payer: Medicare Other | Admitting: Physical Therapy

## 2019-06-27 ENCOUNTER — Encounter: Payer: Self-pay | Admitting: Physical Therapy

## 2019-06-27 ENCOUNTER — Other Ambulatory Visit: Payer: Self-pay

## 2019-06-27 DIAGNOSIS — M25561 Pain in right knee: Secondary | ICD-10-CM | POA: Diagnosis not present

## 2019-06-27 DIAGNOSIS — R262 Difficulty in walking, not elsewhere classified: Secondary | ICD-10-CM

## 2019-06-27 DIAGNOSIS — M25661 Stiffness of right knee, not elsewhere classified: Secondary | ICD-10-CM

## 2019-06-27 DIAGNOSIS — M6281 Muscle weakness (generalized): Secondary | ICD-10-CM

## 2019-06-27 NOTE — Therapy (Signed)
[facility] [address] [place], [address] Phone: [phone]   Fax:  [phone]  Physical Therapy Treatment  Patient Details  Name: Pamela Hendricks MRN: [id number] Date of Birth: July 14, 1946 Referring Provider (PT): Hessie Dibble, MD   Encounter Date: 06/27/2019  PT End of Session - 06/27/19 0909    Visit Number  3    Number of Visits  12    Date for PT Re-Evaluation  07/29/19    Authorization Type  [employer] Medicare    Authorization Time Period  Current Cert Period: 01/16/1016 - 07/29/2019 (latest PN: IE 06/16/2019)    Authorization - Visit Number  3    Authorization - Number of Visits  10    PT Start Time  0900    PT Stop Time  0945    PT Time Calculation (min)  45 min    Activity Tolerance  Patient tolerated treatment well    Behavior During Therapy  [facility] for tasks assessed/performed       Past Medical History:  Diagnosis Date  . Arthritis   . Carotid artery disease ([place])    a. duplex 07/2017 - 1-39% stenosis bilaterally.  . Environmental and seasonal allergies   . GERD (gastroesophageal reflux disease)   . Hyperlipidemia   . Hypertension   . Migraines    sinus headaches, no longer having migraines  . OSA (obstructive sleep apnea)    CPAP  . Peripheral neuropathy   . Peripheral vascular disease ([place])    carotid blockage - is followed by Dr. Marlou Porch  . Snores   . Wears glasses     Past Surgical History:  Procedure Laterality Date  . ABDOMINAL HYSTERECTOMY  1970   Total HYST  . BREAST BIOPSY Bilateral 2001   benign  . BREAST EXCISIONAL BIOPSY Right 2013   sclerosing lesion  . BREAST LUMPECTOMY WITH RADIOACTIVE SEED LOCALIZATION Left 08/31/2018   Procedure: BREAST LUMPECTOMY WITH RADIOACTIVE SEED LOCALIZATION;  Surgeon: Fanny Skates, MD;  Location: [facility];  Service: General;  Laterality: Left;  . COLONOSCOPY    . JOINT REPLACEMENT Left 2001   Left Knee Replacement  . KNEE ARTHROSCOPY  Left    lt  . NASAL SINUS SURGERY    . TOTAL KNEE ARTHROPLASTY Right 10/12/2018   Procedure: TOTAL KNEE ARTHROPLASTY;  Surgeon: Melrose Nakayama, MD;  Location: [facility];  Service: Orthopedics;  Laterality: Right;  . WISDOM TOOTH EXTRACTION      There were no vitals filed for this visit.  Subjective Assessment - 06/27/19 0904    Subjective  Patient reports her L knee feels a little tight this morning, but is not painful. She was "a little sore" following last treatment session until bedtime, but not like it used to be. She has not done her HEP since her last treatment session. She had another freind pass away. Her freind is in [facility] with really bad COVID19. She has not been in contact her friend since the freind was diagnosed.    Pertinent History  Patient is a 73 y.o. female who presents to outpatient physical therapy with a referral for medical diagnosis B TKA. This patient's chief complaints consist of right knee pain, stiffness, and "wobbly" gait leading to the following functional deficits: difficulty with walking, decreased activity tolerance, usual IADLs, community and volunteer activities, quality of life. Relevant past medical history and comorbidities include L 2nd toe removal and surgery in 2018, arthritis in hands,  history of left TKA, peripheral neuropathy, sleep apnea, headaches, carotid artery stenosis 1-39% bilaterally in 07/2017, obesity, inadequate level of physical activity.    Limitations  Sitting;Lifting;Standing;Walking;House hold activities;Other (comment)   community and volunteer activities.   How long can you sit comfortably?  30-40 minutes    How long can you stand comfortably?  30 min (then it gives out)    How long can you walk comfortably?  10 minutes holding on to a buggy    Diagnostic tests  pt reports recent radiographs show good placement and condition of prothesis in R knee and no need for manipulation.    Patient Stated Goals  wants to walk more and walk  faster and have no pain.    Currently in Pain?  No/denies    Pain Onset  1 to 4 weeks ago       TREATMENT:  Therapeutic exercise:to centralize symptoms and improve ROM, strength, muscular endurance, and activity tolerance required for successful completion of functional activities.  Recumbent Bike no resistance with seat position 4 (mild pull) x 3 minutes with free rotation to warm up joint and improve motion to prepare for stretching and exercise, then moved to seat position 3 (where patient felt some tolerable stretching discomfort) for 5 more minutes to improve motoin . For improved lower extremity ROM, muscular endurance, and activity tolerance; and to induce the analgesic effect of aerobic exercise, stimulate joint nutrition, and prepare body structures and systems for following interventions. Completed subjective exam during this time.   Buttocks tap on chair 6x5 reps (pt willing to do 5 at a time instead of 10 due to discomfort breathing through face covering). SpO2 96% and HR 64 bpm following last set. Added to HEP with sets of 10 (expect better rep tolerance due to not wearing face covering at home).  Standing hip abduction at hip machine 2x10 each side at 25#, cuing for form. Patient found it was quite challenging with left leg moving out.  Heel raises off edge of 2x4 board with BUE support, 2x15, to improve ankle strength for balance  Toe raises of edge of 2x4 board, BUE support, x30 to improve ankle strength for balance  Step up to 8 inch step with SL balance at top, x 10 each side. Toe touch on top of step prior to SL balance.   Seated R knee closed chain flexion stretch, 3x30 seconds. Patient reports it is tolerable and feels stretch throughout knee. Added to HEP.   Seated R knee hamstring stretch with self-overpressure with hands. 3x30 seconds. Cuing for technique. Pt reports it is tolerable and she feels stretch at posterior knee. Added to HEP.  Education on HEP  including handout   HOME EXERCISE PROGRAM Access Code: [email or web address]  URL: [email or web address]  Date: 06/27/2019  Prepared by: Rosita Kea   Exercises  Squat with Chair Touch - 3 sets - 10 reps - 1x daily - 7x weekly  Seated Knee Flexion Stretch - 3 reps - 30 seconds hold - 1x daily - 7x weekly  Seated Hamstring Stretch - 3 reps - 30 seconds hold - 1x daily - 7x weekly  Walking - 30 min hold - 5x weekly     PT Education - 06/27/19 0908    Education Details  Exercise purpose/form. Self management techniques.    Person(s) Educated  Patient    Methods  Explanation;Demonstration    Comprehension  Verbalized understanding;Returned demonstration;Verbal cues required;Tactile cues required       PT Short Term  Goals - 06/17/19 1342      PT SHORT TERM GOAL #1   Title  Be independent with appropriate home exercise program for self-management of symptoms.    Baseline  HEP to be updated at visit 2 (06/16/2019);    Time  2    Period  Weeks    Status  New    Target Date  07/01/19        PT Long Term Goals - 06/17/19 1332      PT LONG TERM GOAL #1   Title  Be independent with a long-term home exercise program for self-management of symptoms    Baseline  HEP to be updated at visit 2 (06/16/2019);    Time  6    Period  Weeks    Status  New    Target Date  07/29/19      PT LONG TERM GOAL #2   Title  Demonstrate improved FOTO score by 10 units to demonstrate improvement in overall condition and self-reported functional ability.     Baseline  FOTO = 53 (06/16/2019);    Time  6    Period  Weeks    Status  New    Target Date  07/29/19      PT LONG TERM GOAL #3   Title  Improve right knee PROM to equal or greater than 0-125 degrees flexion to allow patient to complete valued activities with less difficulty.    Baseline  10-0-112 (06/16/2019);    Time  6    Period  Weeks    Status  New    Target Date  07/29/19      PT LONG TERM GOAL #4   Title  Patient will increase 6 Minute  Walk Test distance to equal or greater than 1545 feet to meet norm for community dwelling older adults age 55-79 years to improve functional activity tolerance and meet patient's goal of walking faster and further.    Baseline  1300 feet, very short of breath following, decreased speed partway through due to poor endurance (06/16/2019);    Time  6    Period  Weeks    Status  New    Target Date  07/29/19      PT LONG TERM GOAL #5   Title  Patinet will improve Functional Gait Assessment score to equal or greater than 25/30 to show low fall risk and improved balance for functional activities.    Baseline  23/30 (19-24 = medium risk fall, 06/16/2019);    Time  6    Period  Weeks    Status  New    Target Date  07/29/19      Additional Long Term Goals   Additional Long Term Goals  Yes      PT LONG TERM GOAL #6   Title  Patient will improve static balance in tandem stance with eyes closed to equal or greater than 30 seconds to demonstrate improved balance for functional activities such as need for narrow steps in the dark.    Baseline  R front = 9 second; L front = 4 seconds (06/16/2019);    Time  6    Period  Weeks    Status  New    Target Date  07/29/19      PT LONG TERM GOAL #7   Title  Patient will ascend and descend 4 steps with step over step gait with no UE support to improve ability to navigate stairs while carrying objects  with both hands during daily activities.    Baseline  Able to ascend and descend 4 steps with BUE support on handrails with step over step gait. Reports apprehension and discomfort while stepping down with left foot. (06/16/2019);    Time  6    Period  Weeks    Status  New    Target Date  07/29/19      PT LONG TERM GOAL #8   Title  Complete community, work and/or recreational activities without limitation due to current condition.    Baseline  difficulty with walking, decreased activity tolerance, usual IADLs, community and volunteer activities, quality of life  (06/16/2019);    Time  6    Period  Weeks    Status  New    Target Date  07/29/19            Plan - 06/27/19 0955    Clinical Impression Statement  Patient tolerated treatment well with some difficulty during buttocks taps due to reported discomfort with wearing face covering. SpO2 96% and HR 64 bpm following last set of buttocks taps. Patient stated she felt the step ups were too wobbly, but did not have any LOB when using toe touch as needed and SBA and gait belt for safety. Patient was able to advance to closes level on recumbent bike today with mild discomfort. Pt required multimodal cuing for proper technique and to facilitate improved neuromuscular control, strength, range of motion, and functional ability resulting in improved performance and form. Patient would benefit from continued physical therapy to address remaining impairments and functional limitations to work towards stated goals and return to PLOF or maximal functional independence.    Personal Factors and Comorbidities  Age;Behavior Pattern;Comorbidity 3+;Fitness;Past/Current Experience;Time since onset of injury/illness/exacerbation    Comorbidities  L 2nd toe removal and surgery in 2018, arthritis in hands, history of left TKA, peripheral neuropathy, sleep apnea, headaches, carotid artery stenosis 1-39% bilaterally in 07/2017, obesity, inadequate level of physical activity.    Examination-Activity Limitations  Squat;Lift;Sit;Transfers;Dressing;Stand;Stairs;Bend;Other   difficulty with walking, decreased activity tolerance, usual IADLs, community and volunteer activities, quality of life.   Examination-Participation Restrictions  Cleaning;Community Activity;Yard Work;Volunteer;Shop    Stability/Clinical Decision Making  Stable/Uncomplicated    Rehab Potential  Good    PT Frequency  2x / week    PT Duration  6 weeks    PT Treatment/Interventions  ADLs/Self Care Home Management;Cryotherapy;Moist Heat;Gait training;Therapeutic  activities;Therapeutic exercise;Balance training;Neuromuscular re-education;Functional mobility training;Stair training;Patient/family education;Manual techniques;Scar mobilization;Passive range of motion;Dry needling;Joint Manipulations;Spinal Manipulations;Other (comment)   joint mobilizations grades I-IV   PT Next Visit Plan  progressive LE strengthening and balance exericses with knee ROM exercises    PT Home Exercise Plan  Medbridge Access Code: [email or web address]    Consulted and Agree with Plan of Care  Patient       Patient will benefit from skilled therapeutic intervention in order to improve the following deficits and impairments:  Abnormal gait, Decreased balance, Decreased endurance, Decreased mobility, Difficulty walking, Hypomobility, Cardiopulmonary status limiting activity, Decreased range of motion, Decreased scar mobility, Increased edema, Impaired perceived functional ability, Obesity, Decreased activity tolerance, Decreased coordination, Decreased strength, Impaired flexibility, Pain  Visit Diagnosis: 1. Right knee pain, unspecified chronicity   2. Stiffness of right knee, not elsewhere classified   3. Muscle weakness (generalized)   4. Difficulty in walking, not elsewhere classified        Problem List Patient Active Problem List   Diagnosis Date Noted  . Primary osteoarthritis of  right knee 10/12/2018  . Obstructive sleep apnea treated with continuous positive airway pressure (CPAP) 01/28/2018  . OSA (obstructive sleep apnea) 08/27/2017  . Excessive daytime sleepiness 08/27/2017  . Paronychia of toe 06/16/2016  . Chronic sphenoidal sinusitis 08/13/2015  . Migraine 11/22/2014  . Sleep apnea 11/22/2014  . Abnormal MRI of head 07/04/2013  . Headache(784.0) 03/29/2013  . Peripheral neuropathy   . Major depressive disorder   . Abnormal mammogram 06/10/2012    Everlean Alstrom. Graylon Good, PT, DPT 06/27/19, 9:58 AM  [facility] [address] [place], [address] Phone: [phone]   Fax:  [phone]  Name: QUENTINA FRONEK MRN: [id number] Date of Birth: 1946-02-27

## 2019-06-27 NOTE — Telephone Encounter (Signed)
Called the patient back and she states that her masks feels appropriate. Informed that her CPAP is a auto CPAP machine and that it is set 5-16 cm water pressure. Informed that the machine appears to only be going up to about 13.6 cm water pressure on average. Advised we can certainly lower the max pressure if she feels that it is too much. Patient doesn't feel the pressure needs changing she states that it seems to be something malfunctioning with the machine. Advised that if that is the case then she would need to take the machine to [facility] to have them assist. She thought that is what we do, I advised that we review her data information and that we can make the pressure adjustment changes by ordering but the company works with the machine specifically. Informed her to contact [facility] and have her bring the machine in to have them evaluate the machine and see if they can help fix the noise concern that she mentioned. Patient was scheduled to see Ward Givens, NP but she states she has never to her knowledge seen Dr Brett Fairy and I informed her that we can reschedule to that she follows up with Dr. Brett Fairy. Pt verbalized understanding. Oct 6,2020 at 9:30 am with a check in of 9 am. Pt verbalized understanding.

## 2019-06-28 ENCOUNTER — Encounter: Payer: Medicare Other | Admitting: Physical Therapy

## 2019-06-29 ENCOUNTER — Ambulatory Visit: Payer: Medicare Other | Admitting: Physical Therapy

## 2019-06-30 ENCOUNTER — Encounter: Payer: Medicare Other | Admitting: Physical Therapy

## 2019-07-04 ENCOUNTER — Ambulatory Visit: Payer: Medicare Other | Admitting: Physical Therapy

## 2019-07-05 ENCOUNTER — Encounter: Payer: Medicare Other | Admitting: Physical Therapy

## 2019-07-06 ENCOUNTER — Ambulatory Visit: Payer: Medicare Other | Admitting: Physical Therapy

## 2019-07-07 ENCOUNTER — Encounter: Payer: Medicare Other | Admitting: Physical Therapy

## 2019-07-12 ENCOUNTER — Ambulatory Visit: Payer: Medicare Other | Attending: Orthopaedic Surgery | Admitting: Physical Therapy

## 2019-07-14 ENCOUNTER — Ambulatory Visit: Payer: Medicare Other | Admitting: Physical Therapy

## 2019-07-19 ENCOUNTER — Encounter: Payer: Medicare Other | Admitting: Physical Therapy

## 2019-07-19 ENCOUNTER — Ambulatory Visit: Payer: Medicare Other | Admitting: Physical Therapy

## 2019-07-21 ENCOUNTER — Encounter: Payer: Medicare Other | Admitting: Physical Therapy

## 2019-07-26 ENCOUNTER — Encounter: Payer: Medicare Other | Admitting: Physical Therapy

## 2019-07-28 ENCOUNTER — Encounter: Payer: Medicare Other | Admitting: Physical Therapy

## 2019-08-08 ENCOUNTER — Encounter: Payer: Self-pay | Admitting: Physical Therapy

## 2019-08-08 DIAGNOSIS — M25661 Stiffness of right knee, not elsewhere classified: Secondary | ICD-10-CM

## 2019-08-08 DIAGNOSIS — M6281 Muscle weakness (generalized): Secondary | ICD-10-CM

## 2019-08-08 DIAGNOSIS — R262 Difficulty in walking, not elsewhere classified: Secondary | ICD-10-CM

## 2019-08-08 DIAGNOSIS — M25561 Pain in right knee: Secondary | ICD-10-CM

## 2019-08-08 NOTE — Therapy (Signed)
[facility] [address], [place], [address] Phone: [phone]   Fax:  [phone]  Physical Therapy No-Visit Discharge Summary Reporting period: 06/16/2019 - 08/08/2019  Patient Details  Name: Pamela Hendricks MRN: [id number] Date of Birth: July 26, 1946 Referring Provider (PT): Hessie Dibble, MD   Encounter Date: 08/08/2019    Past Medical History:  Diagnosis Date  . Arthritis   . Carotid artery disease ([place])    a. duplex 07/2017 - 1-39% stenosis bilaterally.  . Environmental and seasonal allergies   . GERD (gastroesophageal reflux disease)   . Hyperlipidemia   . Hypertension   . Migraines    sinus headaches, no longer having migraines  . OSA (obstructive sleep apnea)    CPAP  . Peripheral neuropathy   . Peripheral vascular disease ([place])    carotid blockage - is followed by Dr. Marlou Porch  . Snores   . Wears glasses     Past Surgical History:  Procedure Laterality Date  . ABDOMINAL HYSTERECTOMY  1970   Total HYST  . BREAST BIOPSY Bilateral 2001   benign  . BREAST EXCISIONAL BIOPSY Right 2013   sclerosing lesion  . BREAST LUMPECTOMY WITH RADIOACTIVE SEED LOCALIZATION Left 08/31/2018   Procedure: BREAST LUMPECTOMY WITH RADIOACTIVE SEED LOCALIZATION;  Surgeon: Fanny Skates, MD;  Location: [facility];  Service: General;  Laterality: Left;  . COLONOSCOPY    . JOINT REPLACEMENT Left 2001   Left Knee Replacement  . KNEE ARTHROSCOPY Left    lt  . NASAL SINUS SURGERY    . TOTAL KNEE ARTHROPLASTY Right 10/12/2018   Procedure: TOTAL KNEE ARTHROPLASTY;  Surgeon: Melrose Nakayama, MD;  Location: [facility];  Service: Orthopedics;  Laterality: Right;  . WISDOM TOOTH EXTRACTION      There were no vitals filed for this visit.  Subjective Assessment - 08/08/19 1311    Subjective  After 3 treatment sessions and several no-shows/last minute cancels, pt called and requested discharge from physical therapy due to not  wanting to attend.    Pertinent History  Patient is a 73 y.o. female who presents to outpatient physical therapy with a referral for medical diagnosis B TKA. This patient's chief complaints consist of right knee pain, stiffness, and "wobbly" gait leading to the following functional deficits: difficulty with walking, decreased activity tolerance, usual IADLs, community and volunteer activities, quality of life. Relevant past medical history and comorbidities include L 2nd toe removal and surgery in 2018, arthritis in hands, history of left TKA, peripheral neuropathy, sleep apnea, headaches, carotid artery stenosis 1-39% bilaterally in 07/2017, obesity, inadequate level of physical activity.    Limitations  Sitting;Lifting;Standing;Walking;House hold activities;Other (comment)   community and volunteer activities.   How long can you sit comfortably?  30-40 minutes    How long can you stand comfortably?  30 min (then it gives out)    How long can you walk comfortably?  10 minutes holding on to a buggy    Diagnostic tests  pt reports recent radiographs show good placement and condition of prothesis in R knee and no need for manipulation.    Patient Stated Goals  wants to walk more and walk faster and have no pain.    Pain Onset  1 to 4 weeks ago       OBJECTIVE Patient not present for examination. Please see previous documentation for latest objective data.    PT Short Term Goals - 08/08/19 1316  PT SHORT TERM GOAL #1   Title  Be independent with appropriate home exercise program for self-management of symptoms.    Baseline  HEP to be updated at visit 2 (06/16/2019);    Time  2    Period  Weeks    Status  Not Met    Target Date  07/01/19        PT Long Term Goals - 08/08/19 1316      PT LONG TERM GOAL #1   Title  Be independent with a long-term home exercise program for self-management of symptoms    Baseline  HEP to be updated at visit 2 (06/16/2019);    Time  6    Period  Weeks     Status  Not Met    Target Date  07/29/19      PT LONG TERM GOAL #2   Title  Demonstrate improved FOTO score by 10 units to demonstrate improvement in overall condition and self-reported functional ability.     Baseline  FOTO = 53 (06/16/2019);    Time  6    Period  Weeks    Status  Unable to assess    Target Date  07/29/19      PT LONG TERM GOAL #3   Title  Improve right knee PROM to equal or greater than 0-125 degrees flexion to allow patient to complete valued activities with less difficulty.    Baseline  10-0-112 (06/16/2019);    Time  6    Period  Weeks    Status  Not Met    Target Date  07/29/19      PT LONG TERM GOAL #4   Title  Patient will increase 6 Minute Walk Test distance to equal or greater than 1545 feet to meet norm for community dwelling older adults age 19-79 years to improve functional activity tolerance and meet patient's goal of walking faster and further.    Baseline  1300 feet, very short of breath following, decreased speed partway through due to poor endurance (06/16/2019);    Time  6    Period  Weeks    Status  Unable to assess    Target Date  07/29/19      PT LONG TERM GOAL #5   Title  P[place]et will improve Functional Gait Assessment score to equal or greater than 25/30 to show low fall risk and improved balance for functional activities.    Baseline  23/30 (19-24 = medium risk fall, 06/16/2019);    Time  6    Period  Weeks    Status  Unable to assess    Target Date  07/29/19      PT LONG TERM GOAL #6   Title  Patient will improve static balance in tandem stance with eyes closed to equal or greater than 30 seconds to demonstrate improved balance for functional activities such as need for narrow steps in the dark.    Baseline  R front = 9 second; L front = 4 seconds (06/16/2019);    Time  6    Period  Weeks    Status  Unable to assess    Target Date  07/29/19      PT LONG TERM GOAL #7   Title  Patient will ascend and descend 4 steps with step over step gait  with no UE support to improve ability to navigate stairs while carrying objects with both hands during daily activities.    Baseline  Able to ascend  and descend 4 steps with BUE support on handrails with step over step gait. Reports apprehension and discomfort while stepping down with left foot. (06/16/2019);    Time  6    Period  Weeks    Status  Not Met    Target Date  07/29/19      PT LONG TERM GOAL #8   Title  Complete community, work and/or recreational activities without limitation due to current condition.    Baseline  difficulty with walking, decreased activity tolerance, usual IADLs, community and volunteer activities, quality of life (06/16/2019);    Time  6    Period  Weeks    Status  Not Met    Target Date  07/29/19            Plan - 08/08/19 1315    Clinical Impression Statement  Patient attended 3 physical therapy sessions before requesting discharged due to lack of desire to attend. During her episode of care, patient struggled with attendance with frequent no-shows and last-minute cancels. Patient was unable to continue working towards goals due to lack of participation. She is now discharged from physical therapy per pt request.    Personal Factors and Comorbidities  Age;Behavior Pattern;Comorbidity 3+;Fitness;Past/Current Experience;Time since onset of injury/illness/exacerbation    Comorbidities  L 2nd toe removal and surgery in 2018, arthritis in hands, history of left TKA, peripheral neuropathy, sleep apnea, headaches, carotid artery stenosis 1-39% bilaterally in 07/2017, obesity, inadequate level of physical activity.    Examination-Activity Limitations  Squat;Lift;Sit;Transfers;Dressing;Stand;Stairs;Bend;Other   difficulty with walking, decreased activity tolerance, usual IADLs, community and volunteer activities, quality of life.   Examination-Participation Restrictions  Cleaning;Community Activity;Yard Work;Volunteer;Shop    Stability/Clinical Decision Making   Stable/Uncomplicated    Rehab Potential  Good    PT Frequency  2x / week    PT Duration  6 weeks    PT Treatment/Interventions  ADLs/Self Care Home Management;Cryotherapy;Moist Heat;Gait training;Therapeutic activities;Therapeutic exercise;Balance training;Neuromuscular re-education;Functional mobility training;Stair training;Patient/family education;Manual techniques;Scar mobilization;Passive range of motion;Dry needling;Joint Manipulations;Spinal Manipulations;Other (comment)   joint mobilizations grades I-IV   PT Next Visit Plan  Patient is now discharged from physical therapy per pt request.    PT Home Exercise Plan  Medbridge Access Code: [email or web address]    Consulted and Agree with Plan of Care  Patient       Patient will benefit from skilled therapeutic intervention in order to improve the following deficits and impairments:  Abnormal gait, Decreased balance, Decreased endurance, Decreased mobility, Difficulty walking, Hypomobility, Cardiopulmonary status limiting activity, Decreased range of motion, Decreased scar mobility, Increased edema, Impaired perceived functional ability, Obesity, Decreased activity tolerance, Decreased coordination, Decreased strength, Impaired flexibility, Pain  Visit Diagnosis: Right knee pain, unspecified chronicity  Stiffness of right knee, not elsewhere classified  Muscle weakness (generalized)  Difficulty in walking, not elsewhere classified     Problem List Patient Active Problem List   Diagnosis Date Noted  . Primary osteoarthritis of right knee 10/12/2018  . Obstructive sleep apnea treated with continuous positive airway pressure (CPAP) 01/28/2018  . OSA (obstructive sleep apnea) 08/27/2017  . Excessive daytime sleepiness 08/27/2017  . Paronychia of toe 06/16/2016  . Chronic sphenoidal sinusitis 08/13/2015  . Migraine 11/22/2014  . Sleep apnea 11/22/2014  . Abnormal MRI of head 07/04/2013  . Headache(784.0) 03/29/2013  . Peripheral neuropathy    . Major depressive disorder   . Abnormal mammogram 06/10/2012    Everlean Alstrom. Graylon Good, PT, DPT 08/08/19, 1:17 PM  [facility] [address], [place], [address] Phone: [phone]   Fax:  [phone]  Name: Pamela Hendricks MRN: [id number] Date of Birth: [birth year]

## 2019-08-16 ENCOUNTER — Encounter: Payer: Self-pay | Admitting: Neurology

## 2019-08-16 ENCOUNTER — Ambulatory Visit: Payer: Self-pay | Admitting: Neurology

## 2019-08-17 ENCOUNTER — Other Ambulatory Visit: Payer: Self-pay | Admitting: Otolaryngology

## 2019-08-17 ENCOUNTER — Other Ambulatory Visit: Payer: Self-pay | Admitting: Internal Medicine

## 2019-08-17 DIAGNOSIS — Z1231 Encounter for screening mammogram for malignant neoplasm of breast: Secondary | ICD-10-CM

## 2019-08-23 ENCOUNTER — Ambulatory Visit (INDEPENDENT_AMBULATORY_CARE_PROVIDER_SITE_OTHER): Payer: Medicare Other | Admitting: Neurology

## 2019-08-23 ENCOUNTER — Encounter: Payer: Self-pay | Admitting: Neurology

## 2019-08-23 ENCOUNTER — Other Ambulatory Visit: Payer: Self-pay

## 2019-08-23 VITALS — BP 132/70 | HR 62 | Temp 97.8°F | Ht 65.0 in | Wt 207.0 lb

## 2019-08-23 DIAGNOSIS — G478 Other sleep disorders: Secondary | ICD-10-CM

## 2019-08-23 DIAGNOSIS — G4719 Other hypersomnia: Secondary | ICD-10-CM | POA: Diagnosis not present

## 2019-08-23 DIAGNOSIS — G4733 Obstructive sleep apnea (adult) (pediatric): Secondary | ICD-10-CM | POA: Diagnosis not present

## 2019-08-23 DIAGNOSIS — J323 Chronic sphenoidal sinusitis: Secondary | ICD-10-CM

## 2019-08-23 DIAGNOSIS — Z9989 Dependence on other enabling machines and devices: Secondary | ICD-10-CM

## 2019-08-23 NOTE — Progress Notes (Signed)
SLEEP MEDICINE CLINIC    Provider:  Larey Seat, MD  Primary Care Physician:  Leeroy Cha, MD [address] [place] [address]     Referring Provider: Dr Krista Blue.          Chief Complaint according to patient   Patient presents with:    . New Patient (Initial Visit)           HISTORY OF PRESENT ILLNESS:  Pamela Hendricks is a 73 y.o. year old  Caucasian female patient seen here as a referral on 08/23/2019 from Dr Krista Blue . Previously followed by Cecille Rubin, NP  Chief concern according to patient :  I have never met ou but I have a CPAP that I don't like much. I thought my sleep would be so much better , but its only a little bit better.     I have the pleasure of seeing Pamela Hendricks today, a right -handed White or Caucasian female with a possible sleep disorder.  She has a  has a past medical history of Arthritis, Carotid artery disease ([place]), Environmental and seasonal allergies, GERD (gastroesophageal reflux disease), Hyperlipidemia, Hypertension, Migraines, OSA (obstructive sleep apnea), Peripheral neuropathy, Peripheral vascular disease ([place]), Snores, and Wears glasses..    The patient had the first sleep study  01-28-2015 ,  AHI 14.5/ and RDI 28/h. referred by Dr Krista Blue, titrated to 8 cm water.  she has ben using the CPAP since 2018 when she had another SPLIT night study .  She now is tired of CPAP , and may be interested in inspire.   I had the pleasure of seeing Pamela Hendricks  who brought me a download from her CPAP machine.  She is a 77% compliant CPAP user but the average user time is just falling short of 4 hours at 3 hours and 52 minutes.  She is using an AutoSet with a minimum pressure of 5 and a maximum pressure of 16 cmH2O I did expiratory pressure relief of 3 cm.  Her residual apneas are nearly all obstructive in origin and her AHI is 5.8.  The 95th percentile pressure is 13.5.  I think we need to increase the overall pressure and decrease the  expiratory pressure relief the air leaks are excellent and do not contribute to the residual apneas.  The patient is still a little bit sleepy in daytime but she does not endorse a high degree of fatigue.  I hope that with an increase in pressure we can promote longer more restorative sleep for her.   Social history:  Patient is retired from English as a second language teacher and lives in a household with alone. Family status is widowed  with one living son, her daughter passed.  No pets are present. Tobacco use: none .  ETOH use ; none , Caffeine intake in form of Coffee( 1) Soda( no) Tea ( 1) or energy drinks. Regular exercise: Walking  , but healing from a total right knee.     Sleep habits are as follows: The patient's dinner time is between 5 PM.  The patient goes to bed at 1 AM and continues to sleep for 2 hours, wakes for bathroom breaks,.The preferred sleep position is supine , with the support of 3 pillows.  Dreams are reportedly rare.   7.30AM is the usual rise time. The patient wakes up spontaneously 7.30/ She reports not feeling refreshed or restored in AM, with symptoms such as dry mouth , morning headaches, and  residual fatigue. Post nasal drip,   Naps are taken infrequently, lasting from 90 minutes and are more refreshing than nocturnal sleep.    Review of Systems:  Out of a complete 14 system review, the patient complains of only the following symptoms, and all other reviewed systems are negative.:  Fatigue, sleepiness , snoring, fragmented sleep, Insomnia sometimes. Postnasal drip.   Obesity    How likely are you to doze in the following situations: 0 = not likely, 1 = slight chance, 2 = moderate chance, 3 = high chance   Sitting and Reading? Watching Television? Sitting inactive in a public place (theater or meeting)? As a passenger in a car for an hour without a break? Lying down in the afternoon when circumstances permit? Sitting and talking to someone? Sitting quietly after lunch  without alcohol? In a car, while stopped for a few minutes in traffic?   Total = 12/ 24 points   FSS endorsed at 22 63 points.   Snoring when not using CPAP tossing and turning without CPAP. Marland Kitchen   Social History   Socioeconomic History  . Marital status: Widowed    Spouse name: Laverna Peace  . Number of children: 2  . Years of education: 74  . Highest education level: Not on file  Occupational History  . Occupation: retired    Comment: works partime with vendors  Social Needs  . Financial resource strain: Not on file  . Food insecurity    Worry: Not on file    Inability: Not on file  . Transportation needs    Medical: Not on file    Non-medical: Not on file  Tobacco Use  . Smoking status: Never Smoker  . Smokeless tobacco: Never Used  Substance and Sexual Activity  . Alcohol use: No  . Drug use: No  . Sexual activity: Not on file  Lifestyle  . Physical activity    Days per week: Not on file    Minutes per session: Not on file  . Stress: Not on file  Relationships  . Social Herbalist on phone: Not on file    Gets together: Not on file    Attends religious service: Not on file    Active member of club or organization: Not on file    Attends meetings of clubs or organizations: Not on file    Relationship status: Not on file  Other Topics Concern  . Not on file  Social History Narrative   Patient is widowed. Patient is retired . 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 Patient has a 11th grade education. She has two children    Family History  Problem Relation Age of Onset  . Cancer Mother   . Hypertension Mother   . Hypertension Father   . Heart disease Father        MI around 6, died at 23  . Cancer Brother        Prostate Ca  . Cancer Sister        Brain Ca  . CAD Daughter        Had bypass in her 70s (type 1 diabetic), died at 100    Past Medical History:  Diagnosis Date  . Arthritis   . Carotid artery disease ([place])    a. duplex  07/2017 - 1-39% stenosis bilaterally.  . Environmental and seasonal allergies   . GERD (gastroesophageal reflux disease)   . Hyperlipidemia   . Hypertension   .  Migraines    sinus headaches, no longer having migraines  . OSA (obstructive sleep apnea)    CPAP  . Peripheral neuropathy   . Peripheral vascular disease ([place])    carotid blockage - is followed by Dr. Marlou Porch  . Snores   . Wears glasses     Past Surgical History:  Procedure Laterality Date  . ABDOMINAL HYSTERECTOMY  1970   Total HYST  . BREAST BIOPSY Bilateral 2001   benign  . BREAST EXCISIONAL BIOPSY Right 2013   sclerosing lesion  . BREAST LUMPECTOMY WITH RADIOACTIVE SEED LOCALIZATION Left 08/31/2018   Procedure: BREAST LUMPECTOMY WITH RADIOACTIVE SEED LOCALIZATION;  Surgeon: Fanny Skates, MD;  Location: [facility];  Service: General;  Laterality: Left;  . COLONOSCOPY    . JOINT REPLACEMENT Left 2001   Left Knee Replacement  . KNEE ARTHROSCOPY Left    lt  . NASAL SINUS SURGERY    . TOTAL KNEE ARTHROPLASTY Right 10/12/2018   Procedure: TOTAL KNEE ARTHROPLASTY;  Surgeon: Melrose Nakayama, MD;  Location: [facility];  Service: Orthopedics;  Laterality: Right;  . WISDOM TOOTH EXTRACTION       Current Outpatient Medications on File Prior to Visit  Medication Sig Dispense Refill  . aspirin EC 81 MG tablet Take 81 mg by mouth daily.    Marland Kitchen atorvastatin (LIPITOR) 10 MG tablet Take 10 mg by mouth at bedtime.     . benazepril (LOTENSIN) 40 MG tablet Take 40 mg by mouth daily.     . Calcium Carb-Cholecalciferol (CALCIUM 600+D3 PO) Take 1 tablet by mouth 2 (two) times daily.    . cetirizine (ZYRTEC) 10 MG tablet Take 10 mg by mouth daily.    . fluticasone (FLONASE) 50 MCG/ACT nasal spray Place 2 sprays into both nostrils every other day.     . furosemide (LASIX) 40 MG tablet Take 20 mg by mouth daily.     . pantoprazole (PROTONIX) 40 MG tablet Take 40 mg by mouth daily.     . propranolol ER (INDERAL LA) 60 MG 24 hr capsule Take 1  capsule (60 mg total) by mouth at bedtime. Please call [phone] to schedule appt. 30 capsule 2   No current facility-administered medications on file prior to visit.     Allergies  Allergen Reactions  . Other Other (See Comments)    Watery eyes , itching - Grass, Rag Weed, Mold, Smoke    Physical exam:  Today's Vitals   08/23/19 1333  BP: 132/70  Pulse: 62  Temp: 97.8 F (36.6 C)  Weight: 207 lb (93.9 kg)  Height: '5\' 5"'$  (1.651 m)   Body mass index is 34.45 kg/m.   Wt Readings from Last 3 Encounters:  08/23/19 207 lb (93.9 kg)  10/12/18 208 lb 5.4 oz (94.5 kg)  10/01/18 213 lb 3.2 oz (96.7 kg)     Ht Readings from Last 3 Encounters:  08/23/19 '5\' 5"'$  (1.651 m)  10/12/18 '5\' 5"'$  (1.651 m)  10/01/18 '5\' 5"'$  (1.651 m)      General: The patient is awake, alert and appears not in acute distress. The patient is well groomed. Head: Normocephalic, atraumatic. Neck is supple. Mallampati 3,  neck circumference: 15 inches . Nasal airflow congested - left nasion is restricted. status post nasal surgery. Pamela Hendricks is  seen.   Cardiovascular:  Regular rate and cardiac rhythm by pulse,  without distended neck veins. Respiratory: Lungs are clear to auscultation.  Skin:  Without evidence of ankle edema, or rash. Trunk:  The patient's posture is erect.   Neurologic exam : The patient is awake and alert, oriented to place and time.   Memory subjective described as intact.  Attention span & concentration ability appears normal.  Speech is fluent,  without  dysarthria, dysphonia or aphasia.  Mood and affect are appropriate.   Cranial nerves: no loss of smell or taste reported  Pupils are equal and briskly reactive to light. Funduscopic exam deferred.   Extraocular movements in vertical and horizontal planes were intact and without nystagmus. No Diplopia. Visual fields by finger perimetry are intact.Hearing was intact to soft voice and finger rubbing.   Facial sensation intact to fine  touch. Facial motor strength is symmetric and tongue and uvula move midline. Neck ROM : rotation, tilt and flexion extension were normal for age and shoulder shrug was symmetrical.  Motor exam:  Symmetric bulk, tone and ROM.   Normal tone without cog wheeling, symmetric grip strength . Sensory:  Fine touch, pinprick and vibration were tested  and  normal.  Proprioception tested in the upper extremities was normal. Coordination: Rapid alternating movements in the fingers/hands were of normal speed.  Finger-to-nose maneuver without evidence of ataxia, dysmetria or tremor. Gait and station: Patient could rise unassisted from a seated position, walked without assistive device.  Stance is of normal width/ base and the patient turned with 3 steps.  Toe and heel walk were deferred.  Deep tendon reflexes: in the  upper and lower extremities are symmetric / intact.  Babinski response was deferred .     After spending a total time of 40  minutes face to face and additional time for physical and neurologic examination, review of laboratory studies,  personal review of imaging studies, reports and results of other testing and review of referral information / records as far as provided in visit, I have established the following assessments:  1) incomplete resolution of mild apnea , OSA, on current auto titration CPAP.the machine is only 74 years old. I will increase the pressure to 18 cm max, and reduce the EPR to 2 cm water. She has tried many masks over the last 2 years.    I wish her well with the ENT evaluataion.    My Plan is to proceed with:  1) adjustment in pressure.   2) follow up after 3 month , with MD and after wards with Dr. Krista Blue.   I would like to thank Dr Carollee Sires, [place], [place] [address] [place],  [place] [address] for allowing me to meet with and to take care of this pleasant patient.   In short, Pamela Hendricks is presenting with incomplete resolution of  OSA ,. I plan to follow up either personally or through our NP within 3  month.   CC: I will share my notes with PCP .  Electronically signed by: Larey Seat, MD 08/23/2019 1:42 PM  [facility] Board certified by The [employer] of Sleep Medicine and Diplomate of the [employer] of Sleep Medicine. Board certified In Neurology through the [place], Fellow of the [employer] of Neurology. Medical Director of [facility].

## 2019-08-23 NOTE — Patient Instructions (Signed)

## 2019-08-31 ENCOUNTER — Ambulatory Visit: Payer: Medicare Other | Admitting: Adult Health

## 2019-09-06 ENCOUNTER — Other Ambulatory Visit: Payer: Self-pay | Admitting: Otolaryngology

## 2019-09-06 NOTE — Pre-Procedure Instructions (Signed)
JOSELIN SCHOENDORF  09/06/2019      [facility] [address] [place], [place] - [address] [place] [address] Phone: [phone] Fax: [phone]  [facility] DR[facility] [id number], [place] AT [place] [address] Phone: [phone] Fax: [phone]    Your procedure is scheduled on 09/09/19.  Report to [facility] Admitting at 530 A.M.  Call this number if you have problems the morning of surgery:  [phone]   Remember:  Do not eat or drink after midnight.     Take these medicines the morning of surgery with A SIP OF WATER ---ZYRTEC,ALL INHALERS,PROTONIX,PROPRANOLOL    Do not wear jewelry, make-up or nail polish.  Do not wear lotions, powders, or perfumes, or deodorant.  Do not shave 48 hours prior to surgery.  Men may shave face and neck.  Do not bring valuables to the hospital.  [facility] is not responsible for any belongings or valuables.  Contacts, dentures or bridgework may not be worn into surgery.  Leave your suitcase in the car.  After surgery it may be brought to your room.  For patients admitted to the hospital, discharge time will be determined by your treatment team.  Patients discharged the day of surgery will not be allowed to drive home.    Special instructions:  Do not take any aspirin,anti-inflammatories,vitamins,or herbal supplements 5-7 days prior to surgery.  Please read over the following fact sheets that you were given.  [place] - Preparing for Surgery  Before surgery, you can play an important role.  Because skin is not sterile, your skin needs to be as free of germs as possible.  You can reduce the number of germs on you skin by washing with CHG (chlorahexidine gluconate) soap before surgery.  CHG is an antiseptic cleaner which kills germs and bonds with the skin to continue killing germs even after washing.  Oral  Hygiene is also important in reducing the risk of infection.  Remember to brush your teeth with your regular toothpaste the morning of surgery.  Please DO NOT use if you have an allergy to CHG or antibacterial soaps.  If your skin becomes reddened/irritated stop using the CHG and inform your nurse when you arrive at Short Stay.  Do not shave (including legs and underarms) for at least 48 hours prior to the first CHG shower.  You may shave your face.  Please follow these instructions carefully:   1.  Shower with CHG Soap the night before surgery and the morning of Surgery.  2.  If you choose to wash your hair, wash your hair first as usual with your normal shampoo.  3.  After you shampoo, rinse your hair and body thoroughly to remove the shampoo. 4.  Use CHG as you would any other liquid soap.  You can apply chg directly to the skin and wash gently with a      scrungie or washcloth.           5.  Apply the CHG Soap to your body ONLY FROM THE NECK DOWN.   Do not use on open wounds or open sores. Avoid contact with your eyes, ears, mouth and genitals (private parts).  Wash genitals (private parts) with your normal soap.  6.  Wash thoroughly, paying special attention to the area where your surgery will be  performed.  7.  Thoroughly rinse your body with warm water from the neck down.  8.  DO NOT shower/wash with your normal soap after using and rinsing off the CHG Soap.  9.  Pat yourself dry with a clean towel.            10.  Wear clean pajamas.            11.  Place clean sheets on your bed the night of your first shower and do not sleep with pets.  Day of Surgery  Do not apply any lotions/deoderants the morning of surgery.   Please wear clean clothes to the hospital/surgery center. Remember to brush your teeth with toothpaste.

## 2019-09-07 ENCOUNTER — Other Ambulatory Visit: Payer: Self-pay

## 2019-09-07 ENCOUNTER — Encounter (HOSPITAL_COMMUNITY): Payer: Self-pay | Admitting: Vascular Surgery

## 2019-09-07 ENCOUNTER — Encounter (HOSPITAL_COMMUNITY): Payer: Self-pay

## 2019-09-07 ENCOUNTER — Ambulatory Visit (HOSPITAL_COMMUNITY)
Admission: RE | Admit: 2019-09-07 | Discharge: 2019-09-07 | Disposition: A | Discharge status: Home / Self Care | Payer: Medicare Other | Source: Ambulatory Visit | Attending: Anesthesiology | Admitting: Anesthesiology

## 2019-09-07 ENCOUNTER — Encounter: Payer: Self-pay | Admitting: Cardiology

## 2019-09-07 ENCOUNTER — Ambulatory Visit: Payer: Medicare Other | Admitting: Adult Health

## 2019-09-07 ENCOUNTER — Encounter

## 2019-09-07 ENCOUNTER — Other Ambulatory Visit (HOSPITAL_COMMUNITY)
Admission: RE | Admit: 2019-09-07 | Discharge: 2019-09-07 | Disposition: A | Discharge status: Home / Self Care | Payer: Medicare Other | Source: Ambulatory Visit | Attending: Otolaryngology | Admitting: Otolaryngology

## 2019-09-07 ENCOUNTER — Encounter (HOSPITAL_COMMUNITY)
Admission: RE | Admit: 2019-09-07 | Discharge: 2019-09-07 | Disposition: A | Discharge status: Home / Self Care | Payer: Medicare Other | Source: Ambulatory Visit | Attending: Otolaryngology | Admitting: Otolaryngology

## 2019-09-07 ENCOUNTER — Ambulatory Visit (INDEPENDENT_AMBULATORY_CARE_PROVIDER_SITE_OTHER): Payer: Medicare Other | Admitting: Cardiology

## 2019-09-07 VITALS — BP 126/60 | HR 80 | Ht 65.0 in | Wt 208.0 lb

## 2019-09-07 DIAGNOSIS — R05 Cough: Secondary | ICD-10-CM | POA: Insufficient documentation

## 2019-09-07 DIAGNOSIS — I10 Essential (primary) hypertension: Secondary | ICD-10-CM | POA: Insufficient documentation

## 2019-09-07 DIAGNOSIS — R059 Cough, unspecified: Secondary | ICD-10-CM

## 2019-09-07 DIAGNOSIS — I739 Peripheral vascular disease, unspecified: Secondary | ICD-10-CM | POA: Insufficient documentation

## 2019-09-07 DIAGNOSIS — M1990 Unspecified osteoarthritis, unspecified site: Secondary | ICD-10-CM | POA: Insufficient documentation

## 2019-09-07 DIAGNOSIS — E785 Hyperlipidemia, unspecified: Secondary | ICD-10-CM | POA: Insufficient documentation

## 2019-09-07 DIAGNOSIS — Z01810 Encounter for preprocedural cardiovascular examination: Secondary | ICD-10-CM

## 2019-09-07 DIAGNOSIS — R0789 Other chest pain: Secondary | ICD-10-CM

## 2019-09-07 DIAGNOSIS — Z01818 Encounter for other preprocedural examination: Secondary | ICD-10-CM | POA: Insufficient documentation

## 2019-09-07 DIAGNOSIS — J321 Chronic frontal sinusitis: Secondary | ICD-10-CM | POA: Insufficient documentation

## 2019-09-07 DIAGNOSIS — I779 Disorder of arteries and arterioles, unspecified: Secondary | ICD-10-CM

## 2019-09-07 DIAGNOSIS — Z7982 Long term (current) use of aspirin: Secondary | ICD-10-CM | POA: Insufficient documentation

## 2019-09-07 DIAGNOSIS — Z79899 Other long term (current) drug therapy: Secondary | ICD-10-CM | POA: Insufficient documentation

## 2019-09-07 DIAGNOSIS — J310 Chronic rhinitis: Secondary | ICD-10-CM | POA: Insufficient documentation

## 2019-09-07 DIAGNOSIS — K219 Gastro-esophageal reflux disease without esophagitis: Secondary | ICD-10-CM | POA: Insufficient documentation

## 2019-09-07 DIAGNOSIS — G4733 Obstructive sleep apnea (adult) (pediatric): Secondary | ICD-10-CM | POA: Insufficient documentation

## 2019-09-07 DIAGNOSIS — I6523 Occlusion and stenosis of bilateral carotid arteries: Secondary | ICD-10-CM | POA: Insufficient documentation

## 2019-09-07 DIAGNOSIS — Z20828 Contact with and (suspected) exposure to other viral communicable diseases: Secondary | ICD-10-CM | POA: Diagnosis not present

## 2019-09-07 DIAGNOSIS — Z791 Long term (current) use of non-steroidal anti-inflammatories (NSAID): Secondary | ICD-10-CM | POA: Insufficient documentation

## 2019-09-07 HISTORY — DX: Prediabetes: R73.03

## 2019-09-07 HISTORY — DX: Abnormal levels of other serum enzymes: R74.8

## 2019-09-07 HISTORY — DX: Chronic frontal sinusitis: J32.1

## 2019-09-07 LAB — COMPREHENSIVE METABOLIC PANEL
ALT: 77 U/L — ABNORMAL HIGH (ref 0–44)
AST: 66 U/L — ABNORMAL HIGH (ref 15–41)
Albumin: 3.8 g/dL (ref 3.5–5.0)
Alkaline Phosphatase: 88 U/L (ref 38–126)
Anion gap: 11 (ref 5–15)
BUN: 12 mg/dL (ref 8–23)
CO2: 25 mmol/L (ref 22–32)
Calcium: 9.5 mg/dL (ref 8.9–10.3)
Chloride: 101 mmol/L (ref 98–111)
Creatinine, Ser: 0.71 mg/dL (ref 0.44–1.00)
GFR calc Af Amer: >60 mL/min (ref >60)
GFR calc non Af Amer: >60 mL/min (ref >60)
Glucose, Bld: 94 mg/dL (ref 70–99)
Potassium: 3.9 mmol/L (ref 3.5–5.1)
Sodium: 137 mmol/L (ref 135–145)
Total Bilirubin: 0.6 mg/dL (ref 0.3–1.2)
Total Protein: 6.9 g/dL (ref 6.5–8.1)

## 2019-09-07 LAB — CBC
HCT: 40.9 % (ref 36.0–46.0)
Hemoglobin: 13 g/dL (ref 12.0–15.0)
MCH: 29.1 pg (ref 26.0–34.0)
MCHC: 31.8 g/dL (ref 30.0–36.0)
MCV: 91.5 fL (ref 80.0–100.0)
Platelets: 357 10*3/uL (ref 150–400)
RBC: 4.47 MIL/uL (ref 3.87–5.11)
RDW: 13.6 % (ref 11.5–15.5)
WBC: 11.7 10*3/uL — ABNORMAL HIGH (ref 4.0–10.5)
nRBC: 0 % (ref 0.0–0.2)

## 2019-09-07 LAB — SARS CORONAVIRUS 2 (TAT 6-24 HRS): SARS Coronavirus 2: NEGATIVE

## 2019-09-07 LAB — GLUCOSE, CAPILLARY: Glucose-Capillary: 107 mg/dL — ABNORMAL HIGH (ref 70–99)

## 2019-09-07 NOTE — Pre-Procedure Instructions (Signed)
   PUNAM MANGUM  09/07/2019     [facility] [address] [place], [place] - [address] [facility] [address] [place] [address] Phone: [phone] Fax: [phone]  [facility] DR[facility] STORE [id number], [place] AT [place] [address] Phone: [phone] Fax: [phone]   Your procedure is scheduled on Friday, September 09, 2019  Report to [facility] Admitting at 5:30 A.M.  Call this number if you have problems the morning of surgery:  [phone]   Remember: Brush your teeth the morning of surgery with your regular toothpaste.  Do not eat or drink after midnight Thursday, September 08, 2019    Take these medicines the morning of surgery with A SIP OF WATER :  cetirizine (ZYRTEC) and  pantoprazole (PROTONIX) Stop taking Aspirin (unless otherwise advised by surgeon), vitamins, fish oil and herbal medications. Do not take any NSAIDs [id number] (CELEBREX), Ibuprofen, Advil, Naproxen (Aleve), Motrin, BC and Goody Powder; stop now.   Do not wear jewelry, make-up or nail polish.  Do not wear lotions, powders, or perfumes, or deodorant.  Do not shave 48 hours prior to surgery.   Do not bring valuables to the hospital.  [facility] is not responsible for any belongings or valuables.  Contacts, dentures or bridgework may not be worn into surgery.  Leave your suitcase in the car.  After surgery it may be brought to your room. For patients admitted to the hospital, discharge time will be determined by your treatment team. Patients discharged the day of surgery will not be allowed to drive home.  Special instructions: See " [facility] Preparing For Surgery " sheet Please read over the following fact sheets that you were given. Pain Booklet, Coughing and Deep Breathing and Surgical Site Infection Prevention

## 2019-09-07 NOTE — Progress Notes (Signed)
Pt stated that she is under the care of Dr. Marlou Porch, Cardiology and Dr. Fara Olden., PCP. Pt stated that she has had SOB, cough and a sore throat since 08/23/19. Pt stated " I have been sick, I have been treating myself for the flu. " Pt stated that she has been coughing up  " green, red, and brown " sputum. Pt stated that she has been having " chest tightness in the last week. "  Pt stated that she is scheduled to see Dr. Marlou Porch today. Pt made aware to call MD's office to make them aware of her symptoms; pt agreed to call MD. Nurse called surgeon's office to make MD aware of pt symptoms and pt request to "  sign consent after speaking with surgeon. "  Pt denies having an EKG in the last year. Pt denies recent labs. Pt stated that her last dose of Aspirin was 09/02/19 and last dose of Celebrex was 09/04/19. Myra Gianotti, PA, Anesthesiology, asked to consult pt. PA requested that pt have a chest x ray. Pt verbalized understanding of all pre-op instructions.

## 2019-09-07 NOTE — Progress Notes (Signed)
Cardiology Office Note:    Date:  09/07/2019   ID:  Pamela Hendricks, Pamela Hendricks 03-13-1946, MRN [id number]  PCP:  Leeroy Cha, MD  Cardiologist:  Candee Furbish, MD  Electrophysiologist:  None   Referring MD: Leeroy Cha,*     History of Present Illness:    Pamela Hendricks is a 73 y.o. female here for follow-up of carotid artery disease GERD hypertension hyperlipidemia obstructive sleep apnea.  In review of Pamela Hendricks's note from 07/30/2017 she had undergone a mobile screening fair that showed carotid artery disease.  Also noted atherosclerotic changes of aorta on noninvasive imaging.  Her husband used to be a patient of Dr. Doreatha Lew.  During that prior encounter she was having episodic chest pain for 5 years with atypical features like a pulling component.  Exercise treadmill test on 03/31/2017 showed mildly reduced exercise tolerance hypertensive response to exercise 216/82 otherwise normal.  Nuclear stress test was then performed that was reassuring, low risk.  She ended up having a foot surgery.  She had a carotid ultrasound performed on 07/15/2017 that showed 1 to 39% stenosis bilaterally, no follow-up.  She continues with low-dose atorvastatin, low-dose aspirin, beta-blocker.  09/07/2019-here for preoperative risk stratification prior to ENT surgery with ENT - Dr. Claiborne Rigg wants to perform nasal/sinus surgery.  Ongoing drainage.  She has had occasional chest discomfort off and on.  This is similar to what we were describing back in 2018 when she had a stress test that was overall low risk.  Low risk nuclear stress test. She was asking about the carotid plaque that she had.  Would like to get another carotid scan.  No fevers chills nausea vomiting syncope bleeding   Past Medical History:  Diagnosis Date  . Arthritis   . Carotid artery disease ([place])    a. duplex 07/2017 - 1-39% stenosis bilaterally.  . Chronic frontal sinusitis   . Elevated liver enzymes    per pt  .  Environmental and seasonal allergies   . GERD (gastroesophageal reflux disease)   . Hyperlipidemia   . Hypertension   . Migraines    sinus headaches, no longer having migraines  . OSA (obstructive sleep apnea)    CPAP  . Peripheral neuropathy   . Peripheral vascular disease ([place])    carotid blockage - is followed by Dr. Marlou Porch  . Pre-diabetes   . Snores   . Wears glasses     Past Surgical History:  Procedure Laterality Date  . ABDOMINAL HYSTERECTOMY  1970   Total HYST  . BREAST BIOPSY Bilateral 2001   benign  . BREAST EXCISIONAL BIOPSY Right 2013   sclerosing lesion  . BREAST LUMPECTOMY WITH RADIOACTIVE SEED LOCALIZATION Left 08/31/2018   Procedure: BREAST LUMPECTOMY WITH RADIOACTIVE SEED LOCALIZATION;  Surgeon: Fanny Skates, MD;  Location: [facility];  Service: General;  Laterality: Left;  . COLONOSCOPY    . JOINT REPLACEMENT Left 2001   Left Knee Replacement  . KNEE ARTHROSCOPY Left    lt  . NASAL SINUS SURGERY    . TOE SURGERY     left foot digit # 2  . TOTAL KNEE ARTHROPLASTY Right 10/12/2018   Procedure: TOTAL KNEE ARTHROPLASTY;  Surgeon: Melrose Nakayama, MD;  Location: [facility];  Service: Orthopedics;  Laterality: Right;  . WISDOM TOOTH EXTRACTION      Current Medications: Current Meds  Medication Sig  . aspirin EC 81 MG tablet Take 81 mg by mouth daily.  Marland Kitchen atorvastatin (LIPITOR) 10 MG tablet Take 10  mg by mouth at bedtime.   . benazepril (LOTENSIN) 40 MG tablet Take 40 mg by mouth daily.   . Calcium Carb-Cholecalciferol (CALCIUM 600+D3 PO) Take 1 tablet by mouth 2 (two) times daily.  . cetirizine (ZYRTEC) 10 MG tablet Take 10 mg by mouth daily.  . fluticasone (FLONASE) 50 MCG/ACT nasal spray Place 2 sprays into both nostrils 2 (two) times daily.   . furosemide (LASIX) 80 MG tablet Take 40 mg by mouth daily.   . Multiple Vitamins-Minerals (MULTIVITAMIN WITH MINERALS) tablet Take 1 tablet by mouth daily.  . pantoprazole (PROTONIX) 40 MG tablet Take 40 mg by mouth  daily.   . propranolol ER (INDERAL LA) 60 MG 24 hr capsule Take 1 capsule (60 mg total) by mouth at bedtime. Please call [phone] to schedule appt.     Allergies:   Other   Social History   Socioeconomic History  . Marital status: Widowed    Spouse name: Pamela Hendricks  . Number of children: 2  . Years of education: 6  . Highest education level: Not on file  Occupational History  . Occupation: retired    Comment: works partime with vendors  Social Needs  . Financial resource strain: Not on file  . Food insecurity    Worry: Not on file    Inability: Not on file  . Transportation needs    Medical: Not on file    Non-medical: Not on file  Tobacco Use  . Smoking status: Never Smoker  . Smokeless tobacco: Never Used  Substance and Sexual Activity  . Alcohol use: No  . Drug use: No  . Sexual activity: Not on file  Lifestyle  . Physical activity    Days per week: Not on file    Minutes per session: Not on file  . Stress: Not on file  Relationships  . Social Herbalist on phone: Not on file    Gets together: Not on file    Attends religious service: Not on file    Active member of club or organization: Not on file    Attends meetings of clubs or organizations: Not on file    Relationship status: Not on file  Other Topics Concern  . Not on file  Social History Narrative   Patient is widowed. Patient is retired . 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 Patient has a 11th grade education. She has two children     Family History: The patient's family history includes CAD in her daughter; Cancer in her brother, mother, and sister; Heart disease in her father; Hypertension in her father and mother.  ROS:   Please see the history of present illness.     All other systems reviewed and are negative.  EKGs/Labs/Other Studies Reviewed:    The following studies were reviewed today: Prior labs, EKG, office notes, stress test  NUC stress 2018  Nuclear stress  EF: 75%.  There was no ST segment deviation noted during stress.  Defect 1: There is a small defect of moderate severity present in the apical anterior and apex location.  This is a low risk study.  The left ventricular ejection fraction is hyperdynamic (>65%).   Low risk stress nuclear study with mild soft tissue attenuation; no ischemia; EF 75 with normal wall motion.   EKG: 09/07/19 -sinus rhythm no other abnormalities.  08/16/2018-sinus rhythm 70 with no other abnormalities.  Personally reviewed and interpreted.  Recent Labs: 09/07/2019: ALT 77; BUN 12; Creatinine,  Ser 0.71; Hemoglobin 13.0; Platelets 357; Potassium 3.9; Sodium 137  Recent Lipid Panel    Component Value Date/Time   CHOL 124 11/16/2017 0906   TRIG 89 11/16/2017 0906   HDL 49 11/16/2017 0906   CHOLHDL 2.5 11/16/2017 0906   LDLCALC 57 11/16/2017 0906    Physical Exam:    VS:  BP 126/60   Pulse 80   Ht 5\' 5"  (1.651 m)   Wt 208 lb (94.3 kg)   SpO2 92%   BMI 34.61 kg/m     Wt Readings from Last 3 Encounters:  09/07/19 208 lb (94.3 kg)  09/07/19 208 lb 3.2 oz (94.4 kg)  08/23/19 207 lb (93.9 kg)     GEN: Well nourished, well developed, in no acute distress  HEENT: normal  Neck: no JVD, carotid bruits, or masses Cardiac: RRR; no murmurs, rubs, or gallops,no edema  Respiratory:  clear to auscultation bilaterally, normal work of breathing GI: soft, nontender, nondistended, + BS MS: no deformity or atrophy  Skin: warm and dry, no rash Neuro:  Alert and Oriented x 3, Strength and sensation are intact Psych: euthymic mood, full affect   ASSESSMENT:    1. Pre-operative cardiovascular examination   2. Essential hypertension   3. Hyperlipidemia LDL goal <70   4. Bilateral carotid artery disease, unspecified type ([place])   5. Atypical chest pain    PLAN:    In order of problems listed above:  Cardiac risk stratification -She may proceed with ENT surgery with low overall cardiac risk based upon her  most recent nuclear stress test in 2018.  She has had atypical type chest discomfort off for quite some time.  If any further assistance is necessary, please let us know.  Carotid artery plaque -Mild 1 to 39% bilaterally.  Continue with statin, aspirin, low-dose.  Excellent.  Blood pressure control is excellent.  No changes made.  She would like for [place] to check the carotid Dopplers again.  It has been 2 years.  This is fine.  Aortic atherosclerosis -Seen on noninvasive imaging.  Continue with statin.  Aspirin.  No signs of bleeding.  Continue.  Obesity -BMI 33.  Decrease carbohydrates.  Hard for her to exercise because of orthopedic/foot issues/knee issues.  Dr. Latanya Maudlin has been taking care of her.  Doing very well.  Essential hypertension -Under excellent control with benazepril.  No changes made.  Hyperlipidemia -Low-dose atorvastatin-LDL 85 in April 2019.  Excellent.  No myalgias.  1 year follow-up with Cecille Rubin   Medication Adjustments/Labs and Tests Ordered: Current medicines are reviewed at length with the patient today.  Concerns regarding medicines are outlined above.  Orders Placed This Encounter  Procedures  . VAS US CAROTID   No orders of the defined types were placed in this encounter.   Patient Instructions  Medication Instructions:  The current medical regimen is effective;  continue present plan and medications.  *If you need a refill on your cardiac medications before your next appointment, please call your pharmacy*  Testing/Procedures: Your physician has requested that you have a carotid duplex. This test is an ultrasound of the carotid arteries in your neck. It looks at blood flow through these arteries that supply the brain with blood. Allow one hour for this exam. There are no restrictions or special instructions.  Follow-Up: At [facility], you and your health needs are our priority.  As part of our continuing mission to provide you with exceptional heart  care, we have created designated Provider  Care Teams.  These Care Teams include your primary Cardiologist (physician) and Advanced Practice Providers (APPs -  Physician Assistants and Nurse Practitioners) who all work together to provide you with the care you need, when you need it.  Your next appointment:   12 months  The format for your next appointment:   In Person  Provider:   Truitt Merle, NP  Thank you for choosing [facility]!!        Signed, Candee Furbish, MD  09/07/2019 1:54 PM    [place]

## 2019-09-07 NOTE — Progress Notes (Signed)
Anesthesia PAT Evaluation:  Case: [id number] Date/Time: 09/09/19 0715   Procedure: ENDOSCOPIC SINUS SURGERY with FUSION (Bilateral )   Anesthesia type: General   Pre-op diagnosis: J32.1 Chronic frontal sinusitis, J31.0 Rhinitis, Chronic, G47.33 Obstructive sleep apnea   Location: [facility] OR ROOM 09 / [place] [facility]   Surgeon: Jerrell Belfast, MD      DISCUSSION: Patient is a 73 year old female scheduled for the above procedure.  History includes never smoker, HTN, HLD, carotid artery disease (mild, 2018), OSA (CPAP), peripheral neuropathy, chronic frontal sinusitis, TKA (right 10/12/18; left 09/13/02), breast lumpectomy (right 06/21/12: complex sclerosing lesion, left 08/31/18: complex sclerosing lesion), elevated LFTs. BMI is consistent with obesity.   Reported "flu-like" symptoms 08/23/19 without fever or body aches. Pamela Hendricks said primarily had sore throat and increased nasal drainage and cough. Says Pamela Hendricks gets similar "flu-like" symptoms typically twice a year. Pamela Hendricks developed some SOB last week--describes as needing to take in a deeper breath and tightness on inhalation, but no so much to really affect her activity tolerance. Pamela Hendricks did not see her PCP for her symptoms, and has been self medicating at home with things like NyQuil and Alka-Seltzer Cold and Sinus. Pamela Hendricks reports that with her chronic sinusitis, Pamela Hendricks always has an intermittent productive cough (thick greenish to dark brown sputum), a mild sore or scratchy throat, and sinus drainage, but symptoms on 08/23/19 were worse than her usual. Pamela Hendricks feels about 90% recovered at this point. Is able to use her CPAP, but has to cough or blow nose to get secretions out first. Pamela Hendricks uses Flonase, "Claritin" (Zyrtec is listed on medication list), Saline nasal rinses regularly. Pamela Hendricks does not have a rescue inhaler (has not required one). Pamela Hendricks stays active--lives alone and does her own yard work including mowing, weeding, planting. No exertional chest pain. Reports mild edema that has been  stable. Pamela Hendricks had her presurgical COVID-19 testing this morning 09/07/19, but results are pending.    Pamela Hendricks saw cardiologist Dr. Marlou Porch on 09/07/19 for routine follow-up. He wrote, "Pamela Hendricks may proceed with ENT surgery with low overall cardiac risk based upon her most recent nuclear stress test in 2018.  Pamela Hendricks has had atypical type chest discomfort off for quite some time.  If any further assistance is necessary, please let us know."    Discussed patient's symptoms with anesthesiologist Oleta Mouse, MD. Although patient with chronic respiratory symptoms as mentioned, Pamela Hendricks does seem to have an acute component. Feels Pamela Hendricks is 90% better, but still with intermittent coughing. Ideally may benefit from postponing surgery, but unclear how long symptoms may persistent given chronic sinusitis issues. Recommend to discuss findings with Dr. Wilburn Cornelia which I did. He indicated that surgery is not urgent, so if anesthesiologist feels it is safest for her her recover more fully from her URI then will plan rescheduled surgery within the next several weeks.  09/07/19 COVID-19 and CXR results negative.      VS: BP (!) 131/55   Pulse 65   Temp (!) 36.3 C   Resp 20   Ht 5\' 5"  (1.651 m)   Wt 94.4 kg   SpO2 98%   BMI 34.65 kg/m  Patient with intermittent coughing during evaluation, but not in acute distress. Pamela Hendricks has a rather small mouth, prominent front teeth, but no significant erythema noted in limited view of posterior pharynx. Heart RRR, no murmur noted. Lungs clear without wheezes or crackles. Ankles with no significant edema.    PROVIDERS: Leeroy Cha, MD is PCP [facility]) Candee Furbish, MD is cardiologist.  Last visit 08/16/18 with scheduled routine follow-up 09/07/19.  Dohmeier, Asencion Partridge, MD is neurologist ([facility]). Last visit 08/23/19.  Incomplete resolution of mild apnea, OSA on current auto titration CPAP.  Pressure increased to 80 cm max and reduced EPR to 2 cm  water.  Pamela Hendricks also referred patient to ENT.   LABS: Labs reviewed: Acceptable for surgery. AST 66, ALT 77, but reported known elevated LFTs in the past which is reflected in 2019 labs. PLT count normal.  (all labs ordered are listed, but only abnormal results are displayed)  Labs Reviewed  GLUCOSE, CAPILLARY - Abnormal; Notable for the following components:      Result Value   Glucose-Capillary 107 (*)    All other components within normal limits  COMPREHENSIVE METABOLIC PANEL - Abnormal; Notable for the following components:   AST 66 (*)    ALT 77 (*)    All other components within normal limits  CBC - Abnormal; Notable for the following components:   WBC 11.7 (*)    All other components within normal limits     IMAGES: CXR 09/07/19: FINDINGS: Stable cardiomediastinal silhouette with normal heart size. No pneumothorax. No pleural effusion. Lungs appear clear, with no acute consolidative airspace disease and no pulmonary edema. Stable mild anterior right hemidiaphragmatic eventration. IMPRESSION: No active cardiopulmonary disease.   EKG: 09/07/19: NSR   CV: Nuclear stress test 07/15/17:  Nuclear stress EF: 75%.  There was no ST segment deviation noted during stress.  Defect 1: There is a small defect of moderate severity present in the apical anterior and apex location.  This is a low risk study.  The left ventricular ejection fraction is hyperdynamic (>65%). - Low risk stress nuclear study with mild soft tissue attenuation; no ischemia; EF 75 with normal wall motion  Carotid US 07/15/17:  Heterogeneous plaque, bilaterally. 1 to 39% bilateral ICA stenosis. Normal subclavian arteries, bilaterally. Patent vertebral arteries with antegrade flow. Follow-up as needed.   Past Medical History:  Diagnosis Date  . Arthritis   . Carotid artery disease ([place])    a. duplex 07/2017 - 1-39% stenosis bilaterally.  . Chronic frontal sinusitis   . Environmental and seasonal  allergies   . GERD (gastroesophageal reflux disease)   . Hyperlipidemia   . Hypertension   . Migraines    sinus headaches, no longer having migraines  . OSA (obstructive sleep apnea)    CPAP  . Peripheral neuropathy   . Peripheral vascular disease ([place])    carotid blockage - is followed by Dr. Marlou Porch  . Snores   . Wears glasses     Past Surgical History:  Procedure Laterality Date  . ABDOMINAL HYSTERECTOMY  1970   Total HYST  . BREAST BIOPSY Bilateral 2001   benign  . BREAST EXCISIONAL BIOPSY Right 2013   sclerosing lesion  . BREAST LUMPECTOMY WITH RADIOACTIVE SEED LOCALIZATION Left 08/31/2018   Procedure: BREAST LUMPECTOMY WITH RADIOACTIVE SEED LOCALIZATION;  Surgeon: Fanny Skates, MD;  Location: [place];  Service: General;  Laterality: Left;  . COLONOSCOPY    . JOINT REPLACEMENT Left 2001   Left Knee Replacement  . KNEE ARTHROSCOPY Left    lt  . NASAL SINUS SURGERY    . TOTAL KNEE ARTHROPLASTY Right 10/12/2018   Procedure: TOTAL KNEE ARTHROPLASTY;  Surgeon: Melrose Nakayama, MD;  Location: [place];  Service: Orthopedics;  Laterality: Right;  . WISDOM TOOTH EXTRACTION      MEDICATIONS: . aspirin EC 81 MG tablet  . atorvastatin (LIPITOR)  10 MG tablet  . benazepril (LOTENSIN) 40 MG tablet  . Calcium Carb-Cholecalciferol (CALCIUM 600+D3 PO)  . celecoxib (CELEBREX) 200 MG capsule  . cetirizine (ZYRTEC) 10 MG tablet  . fluticasone (FLONASE) 50 MCG/ACT nasal spray  . furosemide (LASIX) 80 MG tablet  . Multiple Vitamins-Minerals (MULTIVITAMIN WITH MINERALS) tablet  . pantoprazole (PROTONIX) 40 MG tablet  . propranolol ER (INDERAL LA) 60 MG 24 hr capsule   No current facility-administered medications for this encounter.     Myra Gianotti, PA-C Surgical Short Stay/Anesthesiology [facility] Phone [phone] [facility] Phone [phone] 09/07/2019 8:29 PM

## 2019-09-07 NOTE — Patient Instructions (Signed)
Medication Instructions:  The current medical regimen is effective;  continue present plan and medications.  *If you need a refill on your cardiac medications before your next appointment, please call your pharmacy*  Testing/Procedures: Your physician has requested that you have a carotid duplex. This test is an ultrasound of the carotid arteries in your neck. It looks at blood flow through these arteries that supply the brain with blood. Allow one hour for this exam. There are no restrictions or special instructions.  Follow-Up: At [facility], you and your health needs are our priority.  As part of our continuing mission to provide you with exceptional heart care, we have created designated Provider Care Teams.  These Care Teams include your primary Cardiologist (physician) and Advanced Practice Providers (APPs -  Physician Assistants and Nurse Practitioners) who all work together to provide you with the care you need, when you need it.  Your next appointment:   12 months  The format for your next appointment:   In Person  Provider:   Truitt Merle, NP  Thank you for choosing [facility]!!

## 2019-09-07 NOTE — Progress Notes (Signed)
Pt stated that she cannot recall if she had an echo, pt stated " if so, it was over 20 years ago."

## 2019-09-09 ENCOUNTER — Ambulatory Visit (HOSPITAL_COMMUNITY): Admission: RE | Admit: 2019-09-09 | Payer: Medicare Other | Source: Home / Self Care | Admitting: Otolaryngology

## 2019-09-09 ENCOUNTER — Encounter (HOSPITAL_COMMUNITY): Admission: RE | Payer: Self-pay | Source: Home / Self Care

## 2019-09-09 SURGERY — SURGERY, PARANASAL SINUS, ENDOSCOPIC, WITH NASAL SEPTOPLASTY, TURBINOPLASTY, AND MAXILLARY SINUSOTOMY
Anesthesia: General | Laterality: Bilateral

## 2019-09-14 ENCOUNTER — Ambulatory Visit (HOSPITAL_COMMUNITY)
Admission: RE | Admit: 2019-09-14 | Discharge: 2019-09-14 | Disposition: A | Discharge status: Home / Self Care | Payer: Medicare Other | Source: Ambulatory Visit | Attending: Cardiology | Admitting: Cardiology

## 2019-09-14 ENCOUNTER — Other Ambulatory Visit: Payer: Self-pay

## 2019-09-14 ENCOUNTER — Ambulatory Visit
Admission: RE | Admit: 2019-09-14 | Discharge: 2019-09-14 | Disposition: A | Discharge status: Home / Self Care | Payer: Medicare Other | Source: Ambulatory Visit | Attending: Internal Medicine | Admitting: Internal Medicine

## 2019-09-14 DIAGNOSIS — Z1231 Encounter for screening mammogram for malignant neoplasm of breast: Secondary | ICD-10-CM

## 2019-09-14 DIAGNOSIS — I779 Disorder of arteries and arterioles, unspecified: Secondary | ICD-10-CM | POA: Diagnosis not present

## 2019-09-19 ENCOUNTER — Other Ambulatory Visit: Payer: Self-pay | Admitting: Otolaryngology

## 2019-09-26 NOTE — Progress Notes (Signed)
[facility] [address] [place], [place] - [address] [place] [address] Phone: [phone] Fax: [phone]  [facility] [id number], [place] [address] [place] [address] Phone: [phone] Fax: [phone]      Your procedure is scheduled on 09/30/2019.  Report to [facility] Main Entrance "A" at 05:30 A.M., and check in at the Admitting office.  Call this number if you have problems the morning of surgery:  [phone]  Call [phone] if you have any questions prior to your surgery date Monday-Friday 8am-4pm    Remember:  Do not eat or drink after midnight the night before your surgery    Take these medicines the morning of surgery with A SIP OF WATER: Cetirizine (Zyrtec) Fluticasone (Flonase) Pantoprazole (Protonix)  As of today, STOP taking any NSAIDs - Aleve, Naproxen, Ibuprofen, Motrin, Advil, Goody's, BC's; all herbal medications, fish oil, and all vitamins.  Follow your surgeon's instructions on when to stop Aspirin.  If no instructions were given by your surgeon then you will need to call the office to get those instructions.      The Morning of Surgery  Do not wear jewelry, make-up or nail polish.  Do not wear lotions, powders, perfumes, or deodorant  Do not shave 48 hours prior to surgery.    Do not bring valuables to the hospital.  [facility] is not responsible for any belongings or valuables.  If you are a smoker, DO NOT Smoke 24 hours prior to surgery  If you wear a CPAP at night please bring your mask, tubing, and machine the morning of surgery   Remember that you must have someone to transport you home after your surgery, and remain with you for 24 hours if you are discharged the same day.   Contacts, glasses, hearing aids, dentures or bridgework may not be worn into surgery.    Leave your suitcase in the car.  After surgery it  may be brought to your room.  For patients admitted to the hospital, discharge time will be determined by your treatment team.  Patients discharged the day of surgery will not be allowed to drive home.    Special instructions:   [place]- Preparing For Surgery  Before surgery, you can play an important role. Because skin is not sterile, your skin needs to be as free of germs as possible. You can reduce the number of germs on your skin by washing with CHG (chlorahexidine gluconate) Soap before surgery.  CHG is an antiseptic cleaner which kills germs and bonds with the skin to continue killing germs even after washing.    Oral Hygiene is also important to reduce your risk of infection.  Remember - BRUSH YOUR TEETH THE MORNING OF SURGERY WITH YOUR REGULAR TOOTHPASTE  Please do not use if you have an allergy to CHG or antibacterial soaps. If your skin becomes reddened/irritated stop using the CHG.  Do not shave (including legs and underarms) for at least 48 hours prior to first CHG shower. It is OK to shave your face.  Please follow these instructions carefully.   1. Shower the NIGHT BEFORE SURGERY and the MORNING OF SURGERY with CHG Soap.   2. If you chose to wash your hair, wash your hair first as usual with your normal shampoo.  3. After you shampoo, rinse your hair and body thoroughly  to remove the shampoo.  4. Use CHG as you would any other liquid soap. You can apply CHG directly to the skin and wash gently with a scrungie or a clean washcloth.   5. Apply the CHG Soap to your body ONLY FROM THE NECK DOWN.  Do not use on open wounds or open sores. Avoid contact with your eyes, ears, mouth and genitals (private parts). Wash Face and genitals (private parts)  with your normal soap.   6. Wash thoroughly, paying special attention to the area where your surgery will be performed.  7. Thoroughly rinse your body with warm water from the neck down.  8. DO NOT shower/wash with your normal  soap after using and rinsing off the CHG Soap.  9. Pat yourself dry with a CLEAN TOWEL.  10. Wear CLEAN PAJAMAS to bed the night before surgery, wear comfortable clothes the morning of surgery  11. Place CLEAN SHEETS on your bed the night of your first shower and DO NOT SLEEP WITH PETS.    Day of Surgery:  Please shower the morning of surgery with the CHG soap Do not apply any deodorants/lotions.  Please wear clean clothes to the hospital/surgery center.   Remember to brush your teeth WITH YOUR REGULAR TOOTHPASTE.   Please read over the following fact sheets that you were given.

## 2019-09-27 ENCOUNTER — Encounter (HOSPITAL_COMMUNITY): Payer: Self-pay

## 2019-09-27 ENCOUNTER — Other Ambulatory Visit (HOSPITAL_COMMUNITY)
Admission: RE | Admit: 2019-09-27 | Discharge: 2019-09-27 | Disposition: A | Discharge status: Home / Self Care | Payer: Medicare Other | Source: Ambulatory Visit | Attending: Otolaryngology | Admitting: Otolaryngology

## 2019-09-27 ENCOUNTER — Other Ambulatory Visit: Payer: Self-pay

## 2019-09-27 ENCOUNTER — Encounter (HOSPITAL_COMMUNITY)
Admission: RE | Admit: 2019-09-27 | Discharge: 2019-09-27 | Disposition: A | Discharge status: Home / Self Care | Payer: Medicare Other | Source: Ambulatory Visit | Attending: Otolaryngology | Admitting: Otolaryngology

## 2019-09-27 DIAGNOSIS — I10 Essential (primary) hypertension: Secondary | ICD-10-CM | POA: Insufficient documentation

## 2019-09-27 DIAGNOSIS — I2510 Atherosclerotic heart disease of native coronary artery without angina pectoris: Secondary | ICD-10-CM | POA: Insufficient documentation

## 2019-09-27 DIAGNOSIS — Z7982 Long term (current) use of aspirin: Secondary | ICD-10-CM | POA: Insufficient documentation

## 2019-09-27 DIAGNOSIS — J329 Chronic sinusitis, unspecified: Secondary | ICD-10-CM | POA: Diagnosis not present

## 2019-09-27 DIAGNOSIS — Z01812 Encounter for preprocedural laboratory examination: Secondary | ICD-10-CM | POA: Insufficient documentation

## 2019-09-27 DIAGNOSIS — Z79899 Other long term (current) drug therapy: Secondary | ICD-10-CM | POA: Diagnosis not present

## 2019-09-27 DIAGNOSIS — R7303 Prediabetes: Secondary | ICD-10-CM | POA: Diagnosis not present

## 2019-09-27 DIAGNOSIS — I739 Peripheral vascular disease, unspecified: Secondary | ICD-10-CM | POA: Insufficient documentation

## 2019-09-27 DIAGNOSIS — G4733 Obstructive sleep apnea (adult) (pediatric): Secondary | ICD-10-CM | POA: Insufficient documentation

## 2019-09-27 DIAGNOSIS — E785 Hyperlipidemia, unspecified: Secondary | ICD-10-CM | POA: Diagnosis not present

## 2019-09-27 LAB — COMPREHENSIVE METABOLIC PANEL
ALT: 57 U/L — ABNORMAL HIGH (ref 0–44)
AST: 41 U/L (ref 15–41)
Albumin: 3.6 g/dL (ref 3.5–5.0)
Alkaline Phosphatase: 69 U/L (ref 38–126)
Anion gap: 10 (ref 5–15)
BUN: 13 mg/dL (ref 8–23)
CO2: 25 mmol/L (ref 22–32)
Calcium: 9.5 mg/dL (ref 8.9–10.3)
Chloride: 104 mmol/L (ref 98–111)
Creatinine, Ser: 0.61 mg/dL (ref 0.44–1.00)
GFR calc Af Amer: >60 mL/min (ref >60)
GFR calc non Af Amer: >60 mL/min (ref >60)
Glucose, Bld: 104 mg/dL — ABNORMAL HIGH (ref 70–99)
Potassium: 4.2 mmol/L (ref 3.5–5.1)
Sodium: 139 mmol/L (ref 135–145)
Total Bilirubin: 0.6 mg/dL (ref 0.3–1.2)
Total Protein: 6.9 g/dL (ref 6.5–8.1)

## 2019-09-27 LAB — CBC
HCT: 39.5 % (ref 36.0–46.0)
Hemoglobin: 12.5 g/dL (ref 12.0–15.0)
MCH: 29.1 pg (ref 26.0–34.0)
MCHC: 31.6 g/dL (ref 30.0–36.0)
MCV: 92.1 fL (ref 80.0–100.0)
Platelets: 306 10*3/uL (ref 150–400)
RBC: 4.29 MIL/uL (ref 3.87–5.11)
RDW: 14.1 % (ref 11.5–15.5)
WBC: 8.7 10*3/uL (ref 4.0–10.5)
nRBC: 0 % (ref 0.0–0.2)

## 2019-09-27 NOTE — Anesthesia Preprocedure Evaluation (Addendum)
Anesthesia Evaluation  Patient identified by MRN, date of birth, ID band Patient awake    Reviewed: Allergy & Precautions, NPO status , Patient's Chart, lab work & pertinent test results, reviewed documented beta blocker date and time   Airway Mallampati: III  TM Distance: >3 FB Neck ROM: Full    Dental  (+) Dental Advisory Given, Teeth Intact   Pulmonary sleep apnea ,    Pulmonary exam normal breath sounds clear to auscultation       Cardiovascular hypertension, Pt. on medications and Pt. on home beta blockers + Peripheral Vascular Disease  Normal cardiovascular exam Rhythm:Regular Rate:Normal     Neuro/Psych  Headaches, PSYCHIATRIC DISORDERS Depression  Neuromuscular disease    GI/Hepatic Neg liver ROS, GERD  ,  Endo/Other  negative endocrine ROS  Renal/GU negative Renal ROS     Musculoskeletal  (+) Arthritis ,   Abdominal (+) + obese,   Peds  Hematology negative hematology ROS (+)   Anesthesia Other Findings   Reproductive/Obstetrics negative OB ROS                                                            Anesthesia Evaluation  Patient identified by MRN, date of birth, ID band Patient awake    Reviewed: Allergy & Precautions, NPO status , Patient's Chart, lab work & pertinent test results  History of Anesthesia Complications Negative for: history of anesthetic complications  Airway Mallampati: II  TM Distance: >3 FB Neck ROM: Full    Dental no notable dental hx.    Pulmonary sleep apnea and Continuous Positive Airway Pressure Ventilation ,    Pulmonary exam normal        Cardiovascular hypertension, Normal cardiovascular exam     Neuro/Psych Mild carotid stenosis negative psych ROS   GI/Hepatic Neg liver ROS, GERD  ,  Endo/Other  negative endocrine ROS  Renal/GU negative Renal ROS  negative genitourinary   Musculoskeletal  (+) Arthritis ,  Osteoarthritis,    Abdominal (+) + obese,   Peds  Hematology negative hematology ROS (+)   Anesthesia Other Findings 73 yo F for R TKA - HTN/HLD, mild B/L carotid artery stenosis, OSA on CPAP, GERD - Hgb 12.1, plts 256, INR 1.06 - MPS 07/15/17: Low risk stress nuclear study with mild soft tissue attenuation; no ischemia; EF 75 with normal wall motion - EKG 08/16/18: NSR  Reproductive/Obstetrics                            Anesthesia Physical Anesthesia Plan  ASA: III  Anesthesia Plan: Spinal   Post-op Pain Management:  Regional for Post-op pain   Induction:   PONV Risk Score and Plan: 2 and Propofol infusion, TIVA and Treatment may vary due to age or medical condition  Airway Management Planned: Nasal Cannula and Simple Face Mask  Additional Equipment: None  Intra-op Plan:   Post-operative Plan:   Informed Consent: I have reviewed the patients History and Physical, chart, labs and discussed the procedure including the risks, benefits and alternatives for the proposed anesthesia with the patient or authorized representative who has indicated his/her understanding and acceptance.     Plan Discussed with:   Anesthesia Plan Comments:        Anesthesia Quick  Evaluation  Anesthesia Physical Anesthesia Plan  ASA: III  Anesthesia Plan: General   Post-op Pain Management:    Induction: Intravenous  PONV Risk Score and Plan: 4 or greater and Ondansetron, Dexamethasone and Treatment may vary due to age or medical condition  Airway Management Planned: Oral ETT and Video Laryngoscope Planned  Additional Equipment: None  Intra-op Plan:   Post-operative Plan: Extubation in OR  Informed Consent: I have reviewed the patients History and Physical, chart, labs and discussed the procedure including the risks, benefits and alternatives for the proposed anesthesia with the patient or authorized representative who has indicated his/her understanding  and acceptance.     Dental advisory given  Plan Discussed with: CRNA  Anesthesia Plan Comments: (PAT note written 09/27/2019 by Myra Gianotti, PA-C. )      Anesthesia Quick Evaluation

## 2019-09-27 NOTE — Progress Notes (Signed)
Anesthesia Chart Review/Phone Call:   Case: [id number] Date/Time: 09/30/19 0715   Procedure: ENDOSCOPIC SINUS SURGERY (Bilateral )   Anesthesia type: General   Pre-op diagnosis: chronic sinusitis   [facility] OR ROOM 12 / [place] OR   Surgeon: Jerrell Belfast, MD      DISCUSSION: Patient is a 73 year old female scheduled for the above procedure. Surgery was postponed from 09/09/19 due to URI symptoms (negative COVID and CXR 09/07/19, but with mild leukocytosis and acute on chronic cough). Anesthesia wanted to postpone surgery at least two weeks for symptoms to improve. Since then she reported evaluation by her PCP Leeroy Cha, MD on 09/09/19 and completed a course of antibiotics and anti-tussive medication. Says she is now "a whole lot better!" She has her usual "chronic sinusitis" symptoms with nasal drainage and intermittent cough and continues to use Flonase, "Claritin", and nasal saline flushes regularly. Her acute cough symptoms are now better.   History includes never smoker, HTN, HLD, carotid artery disease (mild, 2018), OSA (CPAP), peripheral neuropathy, chronic frontal sinusitis, TKA (right 10/12/18; left 09/13/02), breast lumpectomy (right 06/21/12: complex sclerosing lesion, left 08/31/18: complex sclerosing lesion), elevated LFTs. BMI is consistent with obesity.   She saw cardiologist Dr. Marlou Porch on 09/07/19 for routine follow-up. He wrote, "She may proceed with ENT surgery with low overall cardiac risk based upon her most recent nuclear stress test in 2018. She has had atypical type chest discomfort off for quite some time. If any further assistance is necessary, please let us know."   Preoperative labs noted. WBC now WNL. 09/27/19 COVID-19 test is in process. If negative and otherwise no acute changes then I would anticipate that she can proceed as planned.   VS: BP (!) 127/94   Pulse 69   Temp 36.5 C (Oral)   Resp 18   Ht 5\' 5"  (1.651 m)   Wt 94.1 kg   SpO2 98%   BMI  34.52 kg/m    PROVIDERS: Leeroy Cha, MD is PCP [facility]) - Candee Furbish, MD is cardiologist - Dohmeier, Asencion Partridge, MD is neurologist ([facility]). Last visit 08/23/19.  Incomplete resolution of mild apnea, OSA on current auto titration CPAP.  Pressure increased to 80 cm max and reduced EPR to 2 cm water. She also referred patient to ENT.   LABS: Labs reviewed: Acceptable for surgery. (all labs ordered are listed, but only abnormal results are displayed)  Labs Reviewed  COMPREHENSIVE METABOLIC PANEL - Abnormal; Notable for the following components:      Result Value   Glucose, Bld 104 (*)    ALT 57 (*)    All other components within normal limits  CBC    IMAGES: CXR 09/07/19: FINDINGS: Stable cardiomediastinal silhouette with normal heart size. No pneumothorax. No pleural effusion. Lungs appear clear, with no acute consolidative airspace disease and no pulmonary edema. Stable mild anterior right hemidiaphragmatic eventration. IMPRESSION: No active cardiopulmonary disease.   EKG: 09/07/19: NSR   CV: Carotid US 09/14/19:  Summary: - Right Carotid: Velocities in the right ICA are consistent with a 1-39% stenosis. - Left Carotid: Velocities in the left ICA are consistent with a 1-39% stenosis. - Vertebrals:  Bilateral vertebral arteries demonstrate antegrade flow. - Subclavians: Normal flow hemodynamics were seen in bilateral subclavian arteries.   Nuclear stress test 07/15/17:  Nuclear stress EF: 75%.  There was no ST segment deviation noted during stress.  Defect 1: There is a small defect of moderate severity present in the apical anterior and apex  location.  This is a low risk study.  The left ventricular ejection fraction is hyperdynamic (>65%). - Low risk stress nuclear study with mild soft tissue attenuation; no ischemia; EF 75 with normal wall motion   Past Medical History:  Diagnosis Date  . Arthritis   .  Carotid artery disease ([place])    a. duplex 07/2017 - 1-39% stenosis bilaterally.  . Chronic frontal sinusitis   . Elevated liver enzymes    per pt  . Environmental and seasonal allergies   . GERD (gastroesophageal reflux disease)   . Hyperlipidemia   . Hypertension   . Migraines    sinus headaches, no longer having migraines  . OSA (obstructive sleep apnea)    CPAP  . Peripheral neuropathy   . Peripheral vascular disease ([place])    carotid blockage - is followed by Dr. Marlou Porch  . Pre-diabetes   . Snores   . Wears glasses     Past Surgical History:  Procedure Laterality Date  . ABDOMINAL HYSTERECTOMY  1970   Total HYST  . BREAST BIOPSY Bilateral 2001   benign  . BREAST EXCISIONAL BIOPSY Right 2013   sclerosing lesion  . BREAST LUMPECTOMY WITH RADIOACTIVE SEED LOCALIZATION Left 08/31/2018   Procedure: BREAST LUMPECTOMY WITH RADIOACTIVE SEED LOCALIZATION;  Surgeon: Fanny Skates, MD;  Location: [place];  Service: General;  Laterality: Left;  . COLONOSCOPY    . JOINT REPLACEMENT Left 2001   Left Knee Replacement  . KNEE ARTHROSCOPY Left    lt  . NASAL SINUS SURGERY    . TOE SURGERY     left foot digit # 2  . TOTAL KNEE ARTHROPLASTY Right 10/12/2018   Procedure: TOTAL KNEE ARTHROPLASTY;  Surgeon: Melrose Nakayama, MD;  Location: [place];  Service: Orthopedics;  Laterality: Right;  . WISDOM TOOTH EXTRACTION      MEDICATIONS: . acetaminophen (TYLENOL) 500 MG tablet  . aspirin EC 81 MG tablet  . atorvastatin (LIPITOR) 10 MG tablet  . benazepril (LOTENSIN) 40 MG tablet  . Calcium Carb-Cholecalciferol (CALCIUM 600+D3 PO)  . celecoxib (CELEBREX) 200 MG capsule  . cetirizine (ZYRTEC) 10 MG tablet  . fluticasone (FLONASE) 50 MCG/ACT nasal spray  . furosemide (LASIX) 80 MG tablet  . Multiple Vitamins-Minerals (MULTIVITAMIN WITH MINERALS) tablet  . pantoprazole (PROTONIX) 40 MG tablet  . propranolol ER (INDERAL LA) 60 MG 24 hr capsule   No current facility-administered medications  for this encounter.    Last ASA and Celebrex 09/23/19.   Myra Gianotti, PA-C Surgical Short Stay/Anesthesiology [facility] Phone [phone] [facility] Phone [phone] 09/27/2019 5:15 PM

## 2019-09-27 NOTE — Progress Notes (Signed)
PCP - Dr. Fara Olden Cardiologist - n/a  PPM/ICD - n/a Device Orders -  Rep Notified -   Chest x-ray - 09/07/2019 EKG - 09/07/2019 Stress Test - 07/2017 ECHO - patient unsure, maybe 20+ years ago Cardiac Cath - patient denies  Sleep Study -  Yes, 2016 CPAP - yes  Fasting Blood Sugar - n/a Checks Blood Sugar _____ times a day  Blood Thinner Instructions: last dose of celebrex 09/23/2019 Aspirin Instructions: last dose 09/23/2019  ERAS Protcol - n/a PRE-SURGERY Ensure or G2-   COVID TEST- 09/27/2019 after PAT appointment   Anesthesia review: n/a  Patient denies shortness of breath, fever, cough and chest pain at PAT appointment   All instructions explained to the patient, with a verbal understanding of the material. Patient agrees to go over the instructions while at home for a better understanding. Patient also instructed to self quarantine after being tested for COVID-19. The opportunity to ask questions was provided.

## 2019-09-28 LAB — NOVEL CORONAVIRUS, NAA (HOSP ORDER, SEND-OUT TO REF LAB; TAT 18-24 HRS): SARS-CoV-2, NAA: NOT DETECTED

## 2019-09-30 ENCOUNTER — Encounter (HOSPITAL_COMMUNITY)
Admission: RE | Disposition: A | Discharge status: Home / Self Care | Payer: Self-pay | Source: Home / Self Care | Attending: Otolaryngology

## 2019-09-30 ENCOUNTER — Ambulatory Visit (HOSPITAL_COMMUNITY): Payer: Medicare Other

## 2019-09-30 ENCOUNTER — Encounter (HOSPITAL_COMMUNITY): Payer: Self-pay

## 2019-09-30 ENCOUNTER — Ambulatory Visit (HOSPITAL_COMMUNITY): Payer: Medicare Other | Admitting: Vascular Surgery

## 2019-09-30 ENCOUNTER — Other Ambulatory Visit: Payer: Self-pay

## 2019-09-30 ENCOUNTER — Ambulatory Visit (HOSPITAL_COMMUNITY)
Admission: RE | Admit: 2019-09-30 | Discharge: 2019-09-30 | Disposition: A | Discharge status: Home / Self Care | Payer: Medicare Other | Attending: Otolaryngology | Admitting: Otolaryngology

## 2019-09-30 DIAGNOSIS — Z79899 Other long term (current) drug therapy: Secondary | ICD-10-CM | POA: Diagnosis not present

## 2019-09-30 DIAGNOSIS — K219 Gastro-esophageal reflux disease without esophagitis: Secondary | ICD-10-CM | POA: Insufficient documentation

## 2019-09-30 DIAGNOSIS — E785 Hyperlipidemia, unspecified: Secondary | ICD-10-CM | POA: Insufficient documentation

## 2019-09-30 DIAGNOSIS — J329 Chronic sinusitis, unspecified: Secondary | ICD-10-CM

## 2019-09-30 DIAGNOSIS — M1990 Unspecified osteoarthritis, unspecified site: Secondary | ICD-10-CM | POA: Insufficient documentation

## 2019-09-30 DIAGNOSIS — R7303 Prediabetes: Secondary | ICD-10-CM | POA: Diagnosis not present

## 2019-09-30 DIAGNOSIS — I10 Essential (primary) hypertension: Secondary | ICD-10-CM | POA: Insufficient documentation

## 2019-09-30 DIAGNOSIS — Z96653 Presence of artificial knee joint, bilateral: Secondary | ICD-10-CM | POA: Diagnosis not present

## 2019-09-30 DIAGNOSIS — G4733 Obstructive sleep apnea (adult) (pediatric): Secondary | ICD-10-CM | POA: Insufficient documentation

## 2019-09-30 DIAGNOSIS — Z7982 Long term (current) use of aspirin: Secondary | ICD-10-CM | POA: Diagnosis not present

## 2019-09-30 DIAGNOSIS — I739 Peripheral vascular disease, unspecified: Secondary | ICD-10-CM | POA: Diagnosis not present

## 2019-09-30 DIAGNOSIS — Z791 Long term (current) use of non-steroidal anti-inflammatories (NSAID): Secondary | ICD-10-CM | POA: Insufficient documentation

## 2019-09-30 HISTORY — PX: NASAL SINUS SURGERY: SHX719

## 2019-09-30 SURGERY — SINUS SURGERY, ENDOSCOPIC
Anesthesia: General | Site: Nose | Laterality: Bilateral

## 2019-09-30 MED ORDER — ONDANSETRON HCL 4 MG/2ML IJ SOLN
INTRAMUSCULAR | Status: AC
  Filled 2019-09-30: qty 2

## 2019-09-30 MED ORDER — DEXAMETHASONE SODIUM PHOSPHATE 10 MG/ML IJ SOLN
INTRAMUSCULAR | Status: DC | PRN
  Administered 2019-09-30: 10 mg via INTRAVENOUS

## 2019-09-30 MED ORDER — LIDOCAINE 2% (20 MG/ML) 5 ML SYRINGE
INTRAMUSCULAR | Status: DC | PRN
  Administered 2019-09-30: 100 mg via INTRAVENOUS

## 2019-09-30 MED ORDER — TRIAMCINOLONE ACETONIDE 40 MG/ML IJ SUSP
INTRAMUSCULAR | Status: DC | PRN
  Administered 2019-09-30: 200 mg via INTRAMUSCULAR

## 2019-09-30 MED ORDER — SUCCINYLCHOLINE CHLORIDE 20 MG/ML IJ SOLN
INTRAMUSCULAR | Status: DC | PRN
  Administered 2019-09-30: 120 mg via INTRAVENOUS

## 2019-09-30 MED ORDER — SODIUM CHLORIDE 0.9 % IV SOLN
0.0125 ug/kg/min | INTRAVENOUS | Status: DC
  Filled 2019-09-30: qty 2000

## 2019-09-30 MED ORDER — SUGAMMADEX SODIUM 200 MG/2ML IV SOLN
INTRAVENOUS | Status: DC | PRN
  Administered 2019-09-30: 200 mg via INTRAVENOUS

## 2019-09-30 MED ORDER — FENTANYL CITRATE (PF) 250 MCG/5ML IJ SOLN
INTRAMUSCULAR | Status: AC
  Filled 2019-09-30: qty 5

## 2019-09-30 MED ORDER — LIDOCAINE-EPINEPHRINE 1 %-1:100000 IJ SOLN
INTRAMUSCULAR | Status: AC
  Filled 2019-09-30: qty 1

## 2019-09-30 MED ORDER — FENTANYL CITRATE (PF) 100 MCG/2ML IJ SOLN
INTRAMUSCULAR | Status: DC | PRN
  Administered 2019-09-30: 50 ug via INTRAVENOUS
  Administered 2019-09-30: 100 ug via INTRAVENOUS
  Administered 2019-09-30 (×2): 50 ug via INTRAVENOUS

## 2019-09-30 MED ORDER — HYDROMORPHONE HCL 1 MG/ML IJ SOLN: 0.2500 mg | INTRAMUSCULAR | Status: DC | PRN

## 2019-09-30 MED ORDER — LIDOCAINE-EPINEPHRINE 1 %-1:100000 IJ SOLN
INTRAMUSCULAR | Status: DC | PRN
  Administered 2019-09-30: 5 mL

## 2019-09-30 MED ORDER — ROCURONIUM BROMIDE 10 MG/ML (PF) SYRINGE
PREFILLED_SYRINGE | INTRAVENOUS | Status: AC
  Filled 2019-09-30: qty 10

## 2019-09-30 MED ORDER — PROPOFOL 10 MG/ML IV BOLUS
INTRAVENOUS | Status: AC
  Filled 2019-09-30: qty 20

## 2019-09-30 MED ORDER — MEPERIDINE HCL 25 MG/ML IJ SOLN: 6.2500 mg | INTRAMUSCULAR | Status: DC | PRN

## 2019-09-30 MED ORDER — ROCURONIUM BROMIDE 50 MG/5ML IV SOSY
PREFILLED_SYRINGE | INTRAVENOUS | Status: DC | PRN
  Administered 2019-09-30: 40 mg via INTRAVENOUS

## 2019-09-30 MED ORDER — OXYMETAZOLINE HCL 0.05 % NA SOLN
NASAL | Status: AC
  Filled 2019-09-30: qty 30

## 2019-09-30 MED ORDER — ONDANSETRON HCL 4 MG/2ML IJ SOLN: 4.0000 mg | Freq: Once | INTRAMUSCULAR | Status: DC | PRN

## 2019-09-30 MED ORDER — ONDANSETRON HCL 4 MG/2ML IJ SOLN
INTRAMUSCULAR | Status: DC | PRN
  Administered 2019-09-30: 4 mg via INTRAVENOUS

## 2019-09-30 MED ORDER — TRIAMCINOLONE ACETONIDE 40 MG/ML IJ SUSP
INTRAMUSCULAR | Status: AC
  Filled 2019-09-30: qty 5

## 2019-09-30 MED ORDER — LEVOFLOXACIN 500 MG PO TABS: 500.0000 mg | ORAL_TABLET | Freq: Every day | ORAL | 0 refills | Status: AC

## 2019-09-30 MED ORDER — PROPOFOL 10 MG/ML IV BOLUS
INTRAVENOUS | Status: DC | PRN
  Administered 2019-09-30: 150 mg via INTRAVENOUS

## 2019-09-30 MED ORDER — MUPIROCIN CALCIUM 2 % EX CREA
TOPICAL_CREAM | CUTANEOUS | Status: AC
  Filled 2019-09-30: qty 15

## 2019-09-30 MED ORDER — SODIUM CHLORIDE 0.9 % IR SOLN
Status: DC | PRN
  Administered 2019-09-30: 1000 mL

## 2019-09-30 MED ORDER — LIDOCAINE 2% (20 MG/ML) 5 ML SYRINGE
INTRAMUSCULAR | Status: AC
  Filled 2019-09-30: qty 5

## 2019-09-30 MED ORDER — CEFAZOLIN SODIUM-DEXTROSE 2-4 GM/100ML-% IV SOLN
2.0000 g | INTRAVENOUS | Status: AC
  Administered 2019-09-30: 2 g via INTRAVENOUS
  Filled 2019-09-30: qty 100

## 2019-09-30 MED ORDER — PHENYLEPHRINE HCL (PRESSORS) 10 MG/ML IV SOLN
INTRAVENOUS | Status: DC | PRN
  Administered 2019-09-30: 120 ug via INTRAVENOUS
  Administered 2019-09-30: 80 ug via INTRAVENOUS
  Administered 2019-09-30: 120 ug via INTRAVENOUS

## 2019-09-30 MED ORDER — MUPIROCIN CALCIUM 2 % EX CREA
TOPICAL_CREAM | CUTANEOUS | Status: DC | PRN
  Administered 2019-09-30: 1 via TOPICAL

## 2019-09-30 MED ORDER — LACTATED RINGERS IV SOLN
INTRAVENOUS | Status: DC | PRN
  Administered 2019-09-30: 08:00:00 via INTRAVENOUS

## 2019-09-30 MED ORDER — OXYMETAZOLINE HCL 0.05 % NA SOLN
NASAL | Status: DC | PRN
  Administered 2019-09-30: 1

## 2019-09-30 MED ORDER — DEXAMETHASONE SODIUM PHOSPHATE 10 MG/ML IJ SOLN
INTRAMUSCULAR | Status: AC
  Filled 2019-09-30: qty 1

## 2019-09-30 SURGICAL SUPPLY — 56 items
BLADE RAD40 ROTATE 4M 4 5PK (BLADE) IMPLANT
BLADE RAD40 ROTATE 4M 4MM 5PK (BLADE)
BLADE RAD60 ROTATE M4 4 5PK (BLADE) IMPLANT
BLADE RAD60 ROTATE M4 4MM 5PK (BLADE)
BLADE ROTATE RAD 40 4 M4 (BLADE) ×1 IMPLANT
BLADE ROTATE RAD 40 4MM M4 (BLADE) ×1
BLADE ROTATE TRICUT 4MX13CM M4 (BLADE) ×1
BLADE ROTATE TRICUT 4X13 M4 (BLADE) ×1 IMPLANT
BLADE SURG 15 STRL LF DISP TIS (BLADE) IMPLANT
BLADE SURG 15 STRL SS (BLADE) ×3
BLADE TRICUT ROTATE M4 4 5PK (BLADE) ×2 IMPLANT
BLADE TRICUT ROTATE M4 4MM 5PK (BLADE) ×1
CANISTER SUCT 3000ML PPV (MISCELLANEOUS) ×6 IMPLANT
COAGULATOR SUCT 8FR VV (MISCELLANEOUS) IMPLANT
COVER WAND RF STERILE (DRAPES) ×3 IMPLANT
DECANTER SPIKE VIAL GLASS SM (MISCELLANEOUS) ×3 IMPLANT
DRAPE HALF SHEET 40X57 (DRAPES) IMPLANT
DRESSING NASAL KENNEDY 3.5X.9 (MISCELLANEOUS) IMPLANT
DRSG NASAL KENNEDY 3.5X.9 (MISCELLANEOUS)
DRSG NASOPORE 8CM (GAUZE/BANDAGES/DRESSINGS) ×2 IMPLANT
ELECT REM PT RETURN 9FT ADLT (ELECTROSURGICAL) ×3
ELECTRODE REM PT RTRN 9FT ADLT (ELECTROSURGICAL) ×1 IMPLANT
GLOVE BIOGEL M 7.0 STRL (GLOVE) ×6 IMPLANT
GOWN STRL REUS W/ TWL LRG LVL3 (GOWN DISPOSABLE) ×2 IMPLANT
GOWN STRL REUS W/TWL LRG LVL3 (GOWN DISPOSABLE) ×6
KIT BASIN OR (CUSTOM PROCEDURE TRAY) ×3 IMPLANT
KIT TURNOVER KIT B (KITS) ×3 IMPLANT
NDL 18GX1X1/2 (RX/OR ONLY) (NEEDLE) IMPLANT
NDL HYPO 25GX1X1/2 BEV (NEEDLE) IMPLANT
NDL SPNL 25GX3.5 QUINCKE BL (NEEDLE) IMPLANT
NEEDLE 18GX1X1/2 (RX/OR ONLY) (NEEDLE) ×3 IMPLANT
NEEDLE HYPO 25GX1X1/2 BEV (NEEDLE) ×3 IMPLANT
NEEDLE SPNL 25GX3.5 QUINCKE BL (NEEDLE) ×3 IMPLANT
NS IRRIG 1000ML POUR BTL (IV SOLUTION) ×3 IMPLANT
PAD ARMBOARD 7.5X6 YLW CONV (MISCELLANEOUS) ×6 IMPLANT
PENCIL BUTTON HOLSTER BLD 10FT (ELECTRODE) IMPLANT
SPECIMEN JAR SMALL (MISCELLANEOUS) ×3 IMPLANT
SPONGE GAUZE 2X2 8PLY STER LF (GAUZE/BANDAGES/DRESSINGS) ×1
SPONGE GAUZE 2X2 8PLY STRL LF (GAUZE/BANDAGES/DRESSINGS) ×1 IMPLANT
SPONGE NEURO XRAY DETECT 1X3 (DISPOSABLE) ×3 IMPLANT
SUT CHROMIC 5 0 P 3 (SUTURE) IMPLANT
SUT ETHILON 3 0 FSL (SUTURE) IMPLANT
SUT PLAIN 4 0 ~~LOC~~ 1 (SUTURE) IMPLANT
SWAB COLLECTION DEVICE MRSA (MISCELLANEOUS) ×2 IMPLANT
SWAB CULTURE ESWAB REG 1ML (MISCELLANEOUS) ×2 IMPLANT
SYR BULB 3OZ (MISCELLANEOUS) IMPLANT
SYR CONTROL 10ML LL (SYRINGE) ×3 IMPLANT
TOWEL GREEN STERILE FF (TOWEL DISPOSABLE) ×3 IMPLANT
TRACKER ENT INSTRUMENT (MISCELLANEOUS) ×2 IMPLANT
TRACKER ENT PATIENT (MISCELLANEOUS) ×2 IMPLANT
TRAY ENT MC OR (CUSTOM PROCEDURE TRAY) ×3 IMPLANT
TUBE CONNECTING 12'X1/4 (SUCTIONS) ×1
TUBE CONNECTING 12X1/4 (SUCTIONS) ×2 IMPLANT
TUBING EXTENTION W/L.L. (IV SETS) ×3 IMPLANT
TUBING STRAIGHTSHOT EPS 5PK (TUBING) ×2 IMPLANT
WATER STERILE IRR 1000ML POUR (IV SOLUTION) ×3 IMPLANT

## 2019-09-30 NOTE — Transfer of Care (Cosign Needed)
Immediate Anesthesia Transfer of Care Note  Patient: Pamela Hendricks  Procedure(s) Performed: ENDOSCOPIC SINUS SURGERY (Bilateral Nose)  Patient Location: PACU  Anesthesia Type:General  Level of Consciousness: alert   Airway & Oxygen Therapy: Patient Spontanous Breathing and Patient connected to face mask oxygen  Post-op Assessment: Report given to RN, Post -op Vital signs reviewed and stable and Patient moving all extremities X 4  Post vital signs: Reviewed and stable  Last Vitals:  Vitals Value Taken Time  BP 166/83 09/30/19 0918  Temp    Pulse 69 09/30/19 0923  Resp 16 09/30/19 0923  SpO2 99 % 09/30/19 0923  Vitals shown include unvalidated device data.  Last Pain:  Vitals:   09/30/19 0648  PainSc: 5       Patients Stated Pain Goal: 2 ([id number])  Complications: No apparent anesthesia complications

## 2019-09-30 NOTE — H&P (Signed)
Pamela Hendricks is an 73 y.o. female.   Chief Complaint: Chronic Sinusitis  HPI: Hx of sinusitis and cough  Past Medical History:  Diagnosis Date  . Arthritis   . Carotid artery disease ([place])    a. duplex 07/2017 - 1-39% stenosis bilaterally.  . Chronic frontal sinusitis   . Elevated liver enzymes    per pt  . Environmental and seasonal allergies   . GERD (gastroesophageal reflux disease)   . Hyperlipidemia   . Hypertension   . Migraines    sinus headaches, no longer having migraines  . OSA (obstructive sleep apnea)    CPAP  . Peripheral neuropathy   . Peripheral vascular disease ([place])    carotid blockage - is followed by Dr. Marlou Porch  . Pre-diabetes   . Snores   . Wears glasses     Past Surgical History:  Procedure Laterality Date  . ABDOMINAL HYSTERECTOMY  1970   Total HYST  . BREAST BIOPSY Bilateral 2001   benign  . BREAST EXCISIONAL BIOPSY Right 2013   sclerosing lesion  . BREAST LUMPECTOMY WITH RADIOACTIVE SEED LOCALIZATION Left 08/31/2018   Procedure: BREAST LUMPECTOMY WITH RADIOACTIVE SEED LOCALIZATION;  Surgeon: Fanny Skates, MD;  Location: [facility];  Service: General;  Laterality: Left;  . COLONOSCOPY    . JOINT REPLACEMENT Left 2001   Left Knee Replacement  . KNEE ARTHROSCOPY Left    lt  . NASAL SINUS SURGERY    . TOE SURGERY     left foot digit # 2  . TOTAL KNEE ARTHROPLASTY Right 10/12/2018   Procedure: TOTAL KNEE ARTHROPLASTY;  Surgeon: Melrose Nakayama, MD;  Location: [facility];  Service: Orthopedics;  Laterality: Right;  . WISDOM TOOTH EXTRACTION      Family History  Problem Relation Age of Onset  . Cancer Mother   . Hypertension Mother   . Hypertension Father   . Heart disease Father        MI around 97, died at 60  . Cancer Brother        Prostate Ca  . Cancer Sister        Brain Ca  . CAD Daughter        Had bypass in her 95s (type 1 diabetic), died at 8   Social History:  reports that she has never smoked. She has never used smokeless  tobacco. She reports that she does not drink alcohol or use drugs.  Allergies:  Allergies  Allergen Reactions  . Other Other (See Comments)    Watery eyes , itching - Grass, Rag Weed, Mold, Smoke    Medications Prior to Admission  Medication Sig Dispense Refill  . acetaminophen (TYLENOL) 500 MG tablet Take 1,000 mg by mouth every 6 (six) hours as needed for mild pain or headache.    Marland Kitchen atorvastatin (LIPITOR) 10 MG tablet Take 10 mg by mouth at bedtime.     . benazepril (LOTENSIN) 40 MG tablet Take 40 mg by mouth daily.     . Calcium Carb-Cholecalciferol (CALCIUM 600+D3 PO) Take 1 tablet by mouth 2 (two) times daily.    . cetirizine (ZYRTEC) 10 MG tablet Take 10 mg by mouth daily.    . fluticasone (FLONASE) 50 MCG/ACT nasal spray Place 2 sprays into both nostrils 2 (two) times daily.     . furosemide (LASIX) 80 MG tablet Take 40 mg by mouth daily.     . Multiple Vitamins-Minerals (MULTIVITAMIN WITH MINERALS) tablet Take 1 tablet by mouth daily.    Marland Kitchen  pantoprazole (PROTONIX) 40 MG tablet Take 40 mg by mouth daily.     . propranolol ER (INDERAL LA) 60 MG 24 hr capsule Take 1 capsule (60 mg total) by mouth at bedtime. Please call [phone] to schedule appt. 30 capsule 2  . aspirin EC 81 MG tablet Take 81 mg by mouth daily.    . celecoxib (CELEBREX) 200 MG capsule Take 200 mg by mouth daily.      No results found for this or any previous visit (from the past 48 hour(s)). No results found.  Review of Systems  Constitutional: Negative.   HENT: Negative.   Respiratory: Positive for cough.   Cardiovascular: Negative.     Blood pressure 100/80, pulse 63, temperature 97.6 F (36.4 C), resp. rate 18, height 5\' 5"  (1.651 m), weight 94.1 kg, SpO2 98 %. Physical Exam  Constitutional: She appears well-developed and well-nourished.  Neck: Normal range of motion. Neck supple.  Respiratory: Effort normal.     Assessment/Plan Adm for OP ESS  Jerrell Belfast, MD 09/30/2019, 7:29 AM

## 2019-09-30 NOTE — Op Note (Signed)
Operative Note:  ENDOSCOPIC SINUS SURGERY WITH NAVIGATION       Patient: Pamela Hendricks record number: [id number]  Date:09/30/2019  Pre-operative Indications: 1.  Chronic Sinusitis        Postoperative Indications: Same  Surgical Procedure: 1.  Bilateral revision endoscopic sinus surgery with intraoperative computer-assisted navigation (Fusion) consisting of: Bilateral total ethmoidectomy, bilateral maxillary antrostomy with removal of diseased tissue and bilateral nasal frontal recess exploration       Anesthesia: GET  Surgeon: Delsa Bern, M.D.  Complications: None  EBL: 100 cc  Findings: Heavy erythematous mucosal polypoid changes with mucopurulent discharge in the middle meatus, maxillary sinus and left frontal region.  Culture and sensitivity obtained from the left maxillary sinus.  Bactroban/Kenalog slurry placed in all sinuses and Nasa-pore nasal packing placed.   Brief History: The patient is a 73 y.o. female with a history of chronic sinusitis.  The patient undergone previous endoscopic sinus surgery and continued to have chronic symptoms of purulent nasal discharge, crusting and cough.   The patient has been on medical therapy to reduce nasal mucosal edema and infection including antibiotics, saline nasal spray and topical nasal steroids. Despite appropriate medical therapy the patient continues to have ongoing symptoms. Given the patient's history and findings, the above surgical procedures were recommended, risks and benefits were discussed in detail with the patient may understand and agree with our plan for surgery which is scheduled at [facility] under general anesthesia as an outpatient.  Surgical Procedure: The patient is brought to the operating room on 09/30/2019 and placed in supine position on the operating table. General endotracheal anesthesia was established without difficulty. When the patient was adequately anesthetized, surgical timeout was  performed with correct identification of the patient and the surgical procedure. The patient's nose was then injected with  5 cc of 1% lidocaine 1:100,000 dilution epinephrine which was injected in a submucosal fashion. The patient's nose was then packed with Afrin-soaked cottonoid pledgets were left in place for approximately 10 minutes to allow for vasoconstriction and hemostasis.  The [employer] Fusion navigation headgear was applied in anatomic and surgical landmarks were identified and confirmed, navigation was used throughout the sinus component of the surgical procedure.  With the patient prepped draped and prepared for surgery, nasal nasal endoscopy was performed on the left side.  Using a 0 degree endoscope and a straight microdebrider, a total ethmoidectomy was performed dissecting from anterior to posterior along the floor of the ethmoid sinus removing bony septations and diseased mucosa.  Patient had an obstructed ethmoid cavity with polypoid disease from previous surgery.  Using a 45 degree telescope and a curved microdebrider dissection was then carried out along the roof of the ethmoid sinus with navigation, completing a total ethmoidectomy.  Attention was then turned to the nasal frontal recess and again using a curved microdebrider, navigation and endoscopic visualization the nasal frontal recess was widely open, underlying ethmoid cells and diseased mucosa resected creating a widely patent nasal frontal recess.  The lateral nasal wall was inspected, uncinate process was resected using a through-cutting forcep and the natural ostium of the maxillary sinus was enlarged in a posterior and inferior direction.  Within the maxillary sinus thick mucoid material and polypoid soft tissue was resected.     The patient's right side was then inspected and total ethmoidectomy was performed using a 0 degree telescope and straight microdebrider along the floor of the ethmoid sinus, a 45 degree telescope and curved  microdebrider  with navigation was used along the roof the ethmoid sinus from posterior to anterior to complete a total ethmoidectomy.  The nasal frontal recess was then explored and underlying ethmoid disease was resected with a curved microdebrider, the nasal frontal recess was widely opened and diseased material was resected from within the sinus.  Attention was then turned to the lateral nasal wall, the natural ostium maxillary sinus was identified.  Significant scarring along the lateral nasal wall from previous surgery was removed with through-cutting forceps and the microdebrider.  Diseased mucosa from within the maxilla sinus was then cleared and the ostium was enlarged in a posterior and inferior direction.   Attention was then turned to the inferior turbinates, bilateral inferior turbinate intramural cautery was performed with cautery setting at 19 W.  2 submucosal passes were made in each inferior turbinate.  After completing cautery, anterior vertical incisions were created and overlying soft tissue was elevated, a small amount of turbinate bone was resected.  The turbinates were then outfractured to create a more patent nasal passageway.  At the conclusion of the procedure, a 50-50 mix of Kenalog 40 and Bactroban was instilled in the sinuses.  Nasa-pore nasal packing was placed in the common ethmoid cavity bilaterally.  Surgical sponge count was correct. An oral gastric tube was passed and the stomach contents were aspirated. Patient was awakened from anesthetic and transferred from the operating room to the recovery room in stable condition. There were no complications and blood loss was 100 cc.   Delsa Bern, M.D. [facility] 09/30/2019

## 2019-09-30 NOTE — Progress Notes (Signed)
After final dose of fentanyl by crna at bedside, sats on SM at 6lpm, were saggy, pt needed verbal stimulation  To encourage deep breaths/ lasted about 5 mins and was able to then maintain sats at >/= 95

## 2019-09-30 NOTE — Anesthesia Procedure Notes (Signed)
Procedure Name: Intubation Date/Time: 09/30/2019 7:46 AM Performed by: Georgina Peer, RN Pre-anesthesia Checklist: Patient identified, Emergency Drugs available, Suction available and Patient being monitored Patient Re-evaluated:Patient Re-evaluated prior to induction Oxygen Delivery Method: Circle System Utilized Preoxygenation: Pre-oxygenation with 100% oxygen Induction Type: IV induction Ventilation: Mask ventilation without difficulty Laryngoscope Size: Glidescope and 3 Grade View: Grade I Tube type: Oral Tube size: 7.5 mm Number of attempts: 1 Airway Equipment and Method: Stylet and Oral airway Placement Confirmation: ETT inserted through vocal cords under direct vision,  positive ETCO2 and breath sounds checked- equal and bilateral Secured at: 22 cm Tube secured with: Tape Dental Injury: Teeth and Oropharynx as per pre-operative assessment

## 2019-10-02 LAB — AEROBIC/ANAEROBIC CULTURE W GRAM STAIN (SURGICAL/DEEP WOUND)

## 2019-10-02 NOTE — Anesthesia Postprocedure Evaluation (Signed)
Anesthesia Post Note  Patient: Pamela Hendricks  Procedure(s) Performed: ENDOSCOPIC SINUS SURGERY (Bilateral Nose)     Patient location during evaluation: PACU Anesthesia Type: General Level of consciousness: sedated and patient cooperative Pain management: pain level controlled Vital Signs Assessment: post-procedure vital signs reviewed and stable Respiratory status: spontaneous breathing Cardiovascular status: stable Anesthetic complications: no    Last Vitals:  Vitals:   09/30/19 0945 09/30/19 1000  BP: (!) 155/58 (!) 155/58  Pulse: 64 65  Resp: (!) 8 14  Temp:  36.4 C  SpO2: 93% 94%    Last Pain:  Vitals:   09/30/19 0648  PainSc: [place]

## 2019-10-03 ENCOUNTER — Encounter (HOSPITAL_COMMUNITY): Payer: Self-pay | Admitting: Otolaryngology

## 2019-10-07 LAB — AEROBIC/ANAEROBIC CULTURE W GRAM STAIN (SURGICAL/DEEP WOUND)

## 2019-12-01 ENCOUNTER — Ambulatory Visit: Payer: Medicare Other | Admitting: Neurology

## 2020-01-23 ENCOUNTER — Other Ambulatory Visit: Payer: Self-pay

## 2020-01-23 ENCOUNTER — Encounter: Payer: Self-pay | Admitting: Neurology

## 2020-01-23 ENCOUNTER — Ambulatory Visit (INDEPENDENT_AMBULATORY_CARE_PROVIDER_SITE_OTHER): Payer: Medicare Other | Admitting: Neurology

## 2020-01-23 VITALS — BP 95/52 | HR 62 | Temp 98.4°F | Ht 65.0 in | Wt 213.0 lb

## 2020-01-23 DIAGNOSIS — Z9889 Other specified postprocedural states: Secondary | ICD-10-CM | POA: Diagnosis not present

## 2020-01-23 DIAGNOSIS — G4733 Obstructive sleep apnea (adult) (pediatric): Secondary | ICD-10-CM | POA: Diagnosis not present

## 2020-01-23 NOTE — Progress Notes (Signed)
SLEEP MEDICINE CLINIC    Provider:  Larey Seat, MD  Primary Care Physician:  Leeroy Cha, MD [address] [place] [address]     Referring Provider: Dr Krista Blue.          Chief Complaint according to patient   Patient presents with:    . New Patient (Initial Visit)     pt states that she had surgery and she had stopped using the machine. she has been struggling with a lot of sinus drainage concerns.      HISTORY OF PRESENT ILLNESS:  Pamela Hendricks is a 74 y.o. year old  Caucasian female patient seen here as a referral on 01/23/2020 ,  Pamela Hendricks underwent sinus surgery on October 01, 2019 under the guidance of Dr. Wilburn Cornelia ENT [facility].  Since then she has not been able to use her CPAP but her sleep has not been good.  However she is doing quite well in terms of the postsurgical healing. She still has mucous drainage, thick mucous, and uses saline nasal spray 6 times a day. sinus- rinse at night. She had a blockage of the frontal sinus.  Her OSA is currently not treated. Still has some headaches, taking tylenol. She would be theoretically ready to use CPAP again, but the drainage is bothering her and CPAP feels as if it pushes the mucous back in.    Originally referred  from Dr Krista Blue . Previously followed by Cecille Rubin, NP  Chief concern according to patient :  I have never met ou but I have a CPAP that I don't like much. I thought my sleep would be so much better ,but its only a little bit better.  She had a known medical history of Arthritis, Carotid artery disease ([place]), Chronic frontal sinusitis, Elevated liver enzymes, Environmental and seasonal allergies, GERD (gastroesophageal reflux disease), Hyperlipidemia, Hypertension, Migraines, OSA (obstructive sleep apnea), Peripheral neuropathy, Peripheral vascular disease ([place]), Pre-diabetes, Snores, and Wears glasses.          The patient had the first sleep study  01-28-2015 ,  Resulting  in an AHI 14.5/ and RDI 28/h. referred by Dr Krista Blue, titrated to 8 cm water.  she has been using the CPAP since 2018 when she had another SPLIT night study .  She now is tired of CPAP , and may be interested in [place].   I had the pleasure of seeing Pamela Hendricks  who brought me a download from her CPAP machine.  She is a 77% compliant CPAP user but the average user time is just falling short of 4 hours at 3 hours and 52 minutes.  She is using an AutoSet with a minimum pressure of 5 and a maximum pressure of 16 cmH2O I did expiratory pressure relief of 3 cm.  Her residual apneas are nearly all obstructive in origin and her AHI is 5.8/h. Marland Kitchen  The 95th percentile pressure is 13.5.  I think we need to increase the overall pressure and decrease the expiratory pressure relief the air leaks are excellent and do not contribute to the residual apneas.  The patient is still a little bit sleepy in daytime but she does not endorse a high degree of fatigue.  I hope that with an increase in pressure we can promote longer more restorative sleep for her.   Social history:  Patient is retired from English as a second language teacher and lives in a household with alone. Family status is widowed  with  one living son, her daughter passed.  No pets are present. Tobacco use: none .  ETOH use ; none , Caffeine intake in form of Coffee( 1) Soda( no) Tea ( 1) or energy drinks. Regular exercise: Walking.  Sleep habits are as follows: The patient's dinner time is between 5 PM.  The patient goes to bed at 1 AM and continues to sleep for 2 hours, wakes for bathroom breaks,.The preferred sleep position is supine , with the support of 3 pillows.  Dreams are reportedly rare.  7.30 AM is the usual rise time. The patient wakes up spontaneously 7.30/ She reports not feeling refreshed or restored in AM, with symptoms such as dry mouth , morning headaches, and residual fatigue. Post nasal drip,   Naps are taken infrequently, lasting from 90 minutes and are more  refreshing than nocturnal sleep.    Review of Systems:  Out of a complete 14 system review, the patient complains of only the following symptoms, and all other reviewed systems are negative.:  Fatigue, sleepiness , snoring, fragmented sleep, Insomnia sometimes. Postnasal drip.   Obesity    How likely are you to doze in the following situations: 0 = not likely, 1 = slight chance, 2 = moderate chance, 3 = high chance   Sitting and Reading?  2 Watching Television? 2 Sitting inactive in a public place (theater or meeting)? As a passenger in a car for an hour without a break?3 Lying down in the afternoon when circumstances permit?3 Sitting and talking to someone? Sitting quietly after lunch without alcohol? In a car, while stopped for a few minutes in traffic?   Total = 10/ 24 points   FSS endorsed at 25/ 63 points.   Snoring when not using CPAP, tossing and turning without CPAP. Has not used CPAP since Nov 2020 due to sinus surgery.  .   Social History   Socioeconomic History  . Marital status: Widowed    Spouse name: Pamela Hendricks  . Number of children: 2  . Years of education: 6  . Highest education level: Not on file  Occupational History  . Occupation: retired    Comment: works partime with vendors  Tobacco Use  . Smoking status: Never Smoker  . Smokeless tobacco: Never Used  Substance and Sexual Activity  . Alcohol use: No  . Drug use: No  . Sexual activity: Not on file  Other Topics Concern  . Not on file  Social History Narrative   Patient is widowed. Patient is retired . 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 Patient has a 11th grade education. She has two children   Social Determinants of Health   Financial Resource Strain:   . Difficulty of Paying Living Expenses:   Food Insecurity:   . Worried About Charity fundraiser in the Last Year:   . Arboriculturist in the Last Year:   Transportation Needs:   . Film/video editor (Medical):   Marland Kitchen Lack  of Transportation (Non-Medical):   Physical Activity:   . Days of Exercise per Week:   . Minutes of Exercise per Session:   Stress:   . Feeling of Stress :   Social Connections:   . Frequency of Communication with Friends and Family:   . Frequency of Social Gatherings with Friends and Family:   . Attends Religious Services:   . Active Member of Clubs or Organizations:   . Attends Archivist Meetings:   Marland Kitchen Marital Status:  Family History  Problem Relation Age of Onset  . Cancer Mother   . Hypertension Mother   . Hypertension Father   . Heart disease Father        MI around 73, died at 23  . Cancer Brother        Prostate Ca  . Cancer Sister        Brain Ca  . CAD Daughter        Had bypass in her 93s (type 1 diabetic), died at 48    Past Medical History:  Diagnosis Date  . Arthritis   . Carotid artery disease ([place])    a. duplex 07/2017 - 1-39% stenosis bilaterally.  . Chronic frontal sinusitis   . Elevated liver enzymes    per pt  . Environmental and seasonal allergies   . GERD (gastroesophageal reflux disease)   . Hyperlipidemia   . Hypertension   . Migraines    sinus headaches, no longer having migraines  . OSA (obstructive sleep apnea)    CPAP  . Peripheral neuropathy   . Peripheral vascular disease ([place])    carotid blockage - is followed by Dr. Marlou Porch  . Pre-diabetes   . Snores   . Wears glasses     Past Surgical History:  Procedure Laterality Date  . ABDOMINAL HYSTERECTOMY  1970   Total HYST  . BREAST BIOPSY Bilateral 2001   benign  . BREAST EXCISIONAL BIOPSY Right 2013   sclerosing lesion  . BREAST LUMPECTOMY WITH RADIOACTIVE SEED LOCALIZATION Left 08/31/2018   Procedure: BREAST LUMPECTOMY WITH RADIOACTIVE SEED LOCALIZATION;  Surgeon: Fanny Skates, MD;  Location: [place];  Service: General;  Laterality: Left;  . COLONOSCOPY    . JOINT REPLACEMENT Left 2001   Left Knee Replacement  . KNEE ARTHROSCOPY Left    lt  . NASAL SINUS  SURGERY    . NASAL SINUS SURGERY Bilateral 09/30/2019   Procedure: ENDOSCOPIC SINUS SURGERY;  Surgeon: Jerrell Belfast, MD;  Location: [place];  Service: ENT;  Laterality: Bilateral;  . TOE SURGERY     left foot digit # 2  . TOTAL KNEE ARTHROPLASTY Right 10/12/2018   Procedure: TOTAL KNEE ARTHROPLASTY;  Surgeon: Melrose Nakayama, MD;  Location: [place];  Service: Orthopedics;  Laterality: Right;  . WISDOM TOOTH EXTRACTION       Current Outpatient Medications on File Prior to Visit  Medication Sig Dispense Refill  . acetaminophen (TYLENOL) 500 MG tablet Take 1,000 mg by mouth every 6 (six) hours as needed for mild pain or headache.    Marland Kitchen aspirin EC 81 MG tablet Take 81 mg by mouth daily.    Marland Kitchen atorvastatin (LIPITOR) 10 MG tablet Take 10 mg by mouth at bedtime.     . benazepril (LOTENSIN) 40 MG tablet Take 40 mg by mouth daily.     . Calcium Carb-Cholecalciferol (CALCIUM 600+D3 PO) Take 1 tablet by mouth 2 (two) times daily.    . celecoxib (CELEBREX) 200 MG capsule Take 200 mg by mouth every other day.     . fluocinonide cream (LIDEX) 0.05 % APP EXTERNALLY UNDER THE BREAST BID    . fluticasone (FLONASE) 50 MCG/ACT nasal spray Place into both nostrils daily as needed for allergies or rhinitis.    . hydrochlorothiazide (HYDRODIURIL) 25 MG tablet Take 25 mg by mouth daily.    Marland Kitchen loratadine (CLARITIN) 10 MG tablet Take by mouth.    . Multiple Vitamins-Minerals (MULTIVITAMIN WITH MINERALS) tablet Take 1 tablet by mouth daily.    Marland Kitchen  pantoprazole (PROTONIX) 40 MG tablet Take 40 mg by mouth daily.     . propranolol ER (INDERAL LA) 60 MG 24 hr capsule Take 1 capsule (60 mg total) by mouth at bedtime. Please call [phone] to schedule appt. 30 capsule 2   No current facility-administered medications on file prior to visit.    Allergies  Allergen Reactions  . Other Other (See Comments)    Watery eyes , itching - Grass, Rag Loman Chroman, Smoke    Physical exam:  Today's Vitals   01/23/20 0938  BP: (!)  95/52  Pulse: 62  Temp: 98.4 F (36.9 C)  Weight: 213 lb (96.6 kg)  Height: _0  (1.651 m)   Body mass index is 35.45 kg/m.   Wt Readings from Last 3 Encounters:  01/23/20 213 lb (96.6 kg)  09/30/19 207 lb 7 oz (94.1 kg)  09/27/19 207 lb 7 oz (94.1 kg)     Ht Readings from Last 3 Encounters:  01/23/20 _1  (1.651 m)  09/30/19 _2  (1.651 m)  09/27/19 _3  (1.651 m)      General: The patient is awake, alert and appears not in acute distress. The patient is well groomed. Head: Normocephalic, atraumatic. Neck is supple. Mallampati 3,  neck circumference: 15 inches . Nasal airflow opened  - left nasion is restricted.  status post nasal surgery. Twice had sinus surgery .   Retrognathia is seen.  Crowded lower jaw, small mouth opening.    BMI - 35. Cardiovascular:  Regular rate and cardiac rhythm by pulse. Respiratory: Lungs are clear to auscultation.  Skin:  Without evidence of ankle edema, or rash. Well healed scar over the right knee.  Trunk: The patient's posture is erect.   Neurologic exam : The patient is awake and alert, oriented to place and time.   Memory subjective described as intact.  Attention span & concentration ability appears normal.  Speech is fluent,  without  dysarthria, dysphonia or aphasia.  Mood and affect are appropriate.   Cranial nerves: no loss of smell or taste reported  Pupils are equal and briskly reactive to light. Funduscopic exam deferred.   Extraocular movements in vertical and horizontal planes were intact and without nystagmus. No Diplopia. Visual fields by finger perimetry are intact.Hearing was intact to soft voice and finger rubbing.   Facial sensation intact to fine touch. Facial motor strength is symmetric and tongue and uvula move midline. Neck ROM : rotation, tilt and flexion extension were normal for age and shoulder shrug was symmetrical.  Motor exam:  Symmetric bulk, tone and ROM.   Normal tone without cog wheeling, symmetric  grip strength . Sensory:  deferred. Coordination: deferred. Gait and station: Patient could rise assisted from a seated position, she braces herself.  Deep tendon reflexes: in the upper and lower extremities are symmetric / intact.  Babinski response was deferred .     After spending a total time of 40  minutes face to face and additional time for physical and neurologic examination, review of laboratory studies,  personal review of imaging studies, reports and results of other testing and review of referral information / records as far as provided in visit, I have established the following assessments:  1) incomplete resolution of mild apnea, OSA, on current auto titration CPAP.the machine is only 75 years old.  I wish her well with the ENT evaluation.    My Plan is to proceed with:  1) auto CPAP  - try again once  Dr Wilburn Cornelia gives you the OK.    2) follow up after 3 month , with NP and afterwards with Dr. Krista Blue.   I would like to thank Dr. Carollee Sires, [place], [place] [address] [place],  [place] [address] for allowing me to meet with and to take care of this pleasant patient.   In short, DESARAE PLACIDE is presenting with incomplete resolution of OSA ,. I plan to follow up either personally or through our NP within 3 month.   CC: I will share my notes with PCP .  Electronically signed by: Larey Seat, MD 01/23/2020 9:58 AM  [facility] Board certified by The [employer] of Sleep Medicine and Diplomate of the [employer] of Sleep Medicine. Board certified In Neurology through the [place], Fellow of the [employer] of Neurology. Medical Director of [facility].

## 2020-02-13 ENCOUNTER — Ambulatory Visit (INDEPENDENT_AMBULATORY_CARE_PROVIDER_SITE_OTHER): Payer: Medicare Other | Admitting: Neurology

## 2020-02-13 ENCOUNTER — Encounter: Payer: Self-pay | Admitting: Neurology

## 2020-02-13 ENCOUNTER — Telehealth: Payer: Self-pay | Admitting: Neurology

## 2020-02-13 ENCOUNTER — Other Ambulatory Visit: Payer: Self-pay

## 2020-02-13 VITALS — BP 112/62 | HR 68 | Temp 98.3°F | Ht 65.0 in | Wt 213.0 lb

## 2020-02-13 DIAGNOSIS — R519 Headache, unspecified: Secondary | ICD-10-CM | POA: Diagnosis not present

## 2020-02-13 DIAGNOSIS — G5711 Meralgia paresthetica, right lower limb: Secondary | ICD-10-CM

## 2020-02-13 MED ORDER — NORTRIPTYLINE HCL 25 MG PO CAPS: 50.0000 mg | ORAL_CAPSULE | Freq: Every day | ORAL | 11 refills | Status: DC

## 2020-02-13 MED ORDER — LIDOCAINE-PRILOCAINE 2.5-2.5 % EX CREA: 1.0000 "application " | TOPICAL_CREAM | Freq: Three times a day (TID) | CUTANEOUS | 11 refills | Status: DC | PRN

## 2020-02-13 NOTE — Telephone Encounter (Signed)
[employer] medicare/medicaid order sent to [facility]. No auth they will reach out to the patient to schedule.  °

## 2020-02-13 NOTE — Progress Notes (Signed)
PATIENT: Pamela Hendricks DOB: June 17, 1946  Chief Complaint  Patient presents with  . Hx of migraines    Reports intermittent sharp pain and pressure in her head at least once per week and sometimes more often. Symptoms tend to last at least 15 minutes. She typically uses cold compresses and lays down to rest. She occasionally uses Tylenol with limited relief.   Marland Kitchen PCP    Leeroy Cha, MD     HISTORICAL  Pamela Hendricks is a 74 year old female, seen in request by her primary care physician Leeroy Cha for evaluation  of head pressure, frequent transient sharp pain, initial evaluation was February 13, 2020.  She has past medical history of hypertension, hyperlipidemia, I saw her initially in 2016 for similar complaints,  She had long history of "sinus headache", with frequent sinus symptoms, runny nose, watering eyes, for many years she self treated with Claritin, around 2014, she had worsening of her headaches, presented to the emergency room few times around the period of time, I personally reviewed MRI of brain in May 2014, no acute abnormality, mild supratentorium small vessel disease, there is also evidence of acute left sphenoid sinus disease, chronic opacification of the right sphenoid sinus  She was started on Inderal 50 mg daily, which has helped her headache moderately, she also complains of early morning headache, was later referred to sleep study, confirmed obstructive sleep apnea, was prescribed CPAP machine, later nortriptyline 50 mg every night was also added on, her headache was apparently under good control for a while, she lost to follow-up.  She had bilateral revision endoscopic sinus surgery bilateral total ethmoidectomy, bilateral maxillary antrostomy, with removal of diseased tissue, lateral nasal frontal recess exploration by ENT  Dr. Wilburn Cornelia in November 2020  Despite the sinus surgery, she continue complains of returning of her headaches, she  complains of constant pressure in her head," my head is as big as this room", in addition she complains of transient sharp piercing pain involving different spot in her skull, lasting for few seconds, she denies visual change, hearing loss, no jaw claudication,  She continue complains of frequent sinus symptoms, nasal discharge, she does nasal wash few times each day, with dried discharge.  She was not able to tolerate her CPAP machine since her surgery, taking Tylenol at least 4 tablets on a daily basis  She also complains of sudden onset right lateral thigh area paresthesia above knee since February 2020, she had a history of bilateral knee replacement, mild low back pain,  REVIEW OF SYSTEMS: Full 14 system review of systems performed and notable only for as above All other review of systems were negative.  ALLERGIES: Allergies  Allergen Reactions  . Other Other (See Comments)    Watery eyes , itching - Grass, Rag Weed, Mold, Smoke    HOME MEDICATIONS: Current Outpatient Medications  Medication Sig Dispense Refill  . acetaminophen (TYLENOL) 500 MG tablet Take 1,000 mg by mouth every 6 (six) hours as needed for mild pain or headache.    Marland Kitchen aspirin EC 81 MG tablet Take 81 mg by mouth daily.    Marland Kitchen atorvastatin (LIPITOR) 10 MG tablet Take 10 mg by mouth at bedtime.     . benazepril (LOTENSIN) 40 MG tablet Take 40 mg by mouth daily.     . Calcium Carb-Cholecalciferol (CALCIUM 600+D3 PO) Take 1 tablet by mouth 2 (two) times daily.    . celecoxib (CELEBREX) 200 MG capsule Take 200 mg by mouth every  other day.     . fluocinonide cream (LIDEX) 0.05 % APP EXTERNALLY UNDER THE BREAST BID    . fluticasone (FLONASE) 50 MCG/ACT nasal spray Place into both nostrils daily as needed for allergies or rhinitis.    . hydrochlorothiazide (HYDRODIURIL) 25 MG tablet Take 25 mg by mouth daily.    Marland Kitchen loratadine (CLARITIN) 10 MG tablet Take by mouth.    . Multiple Vitamins-Minerals (MULTIVITAMIN WITH MINERALS)  tablet Take 1 tablet by mouth daily.    . pantoprazole (PROTONIX) 40 MG tablet Take 40 mg by mouth daily.     . propranolol ER (INDERAL LA) 60 MG 24 hr capsule Take 1 capsule (60 mg total) by mouth at bedtime. Please call [phone] to schedule appt. 30 capsule 2   No current facility-administered medications for this visit.    PAST MEDICAL HISTORY: Past Medical History:  Diagnosis Date  . Arthritis   . Carotid artery disease ([place])    a. duplex 07/2017 - 1-39% stenosis bilaterally.  . Chronic frontal sinusitis   . Elevated liver enzymes    per pt  . Environmental and seasonal allergies   . GERD (gastroesophageal reflux disease)   . Hyperlipidemia   . Hypertension   . Migraines    sinus headaches, no longer having migraines  . OSA (obstructive sleep apnea)    CPAP  . Peripheral neuropathy   . Peripheral vascular disease ([place])    carotid blockage - is followed by Dr. Marlou Porch  . Pre-diabetes   . Snores   . Wears glasses     PAST SURGICAL HISTORY: Past Surgical History:  Procedure Laterality Date  . ABDOMINAL HYSTERECTOMY  1970   Total HYST  . BREAST BIOPSY Bilateral 2001   benign  . BREAST EXCISIONAL BIOPSY Right 2013   sclerosing lesion  . BREAST LUMPECTOMY WITH RADIOACTIVE SEED LOCALIZATION Left 08/31/2018   Procedure: BREAST LUMPECTOMY WITH RADIOACTIVE SEED LOCALIZATION;  Surgeon: Fanny Skates, MD;  Location: [place];  Service: General;  Laterality: Left;  . COLONOSCOPY    . JOINT REPLACEMENT Left 2001   Left Knee Replacement  . KNEE ARTHROSCOPY Left    lt  . NASAL SINUS SURGERY    . NASAL SINUS SURGERY Bilateral 09/30/2019   Procedure: ENDOSCOPIC SINUS SURGERY;  Surgeon: Jerrell Belfast, MD;  Location: [facility];  Service: ENT;  Laterality: Bilateral;  . TOE SURGERY     left foot digit # 2  . TOTAL KNEE ARTHROPLASTY Right 10/12/2018   Procedure: TOTAL KNEE ARTHROPLASTY;  Surgeon: Melrose Nakayama, MD;  Location: [place];  Service: Orthopedics;  Laterality: Right;  .  WISDOM TOOTH EXTRACTION      FAMILY HISTORY: Family History  Problem Relation Age of Onset  . Cancer Mother   . Hypertension Mother   . Hypertension Father   . Heart disease Father        MI around 55, died at 69  . Cancer Brother        Prostate Ca  . Cancer Sister        Brain Ca  . CAD Daughter        Had bypass in her 55s (type 1 diabetic), died at 33    SOCIAL HISTORY: Social History   Socioeconomic History  . Marital status: Widowed    Spouse name: Laverna Peace  . Number of children: 2  . Years of education: 61  . Highest education level: Not on file  Occupational History  . Occupation: retired    Comment:  works partime with vendors  Tobacco Use  . Smoking status: Never Smoker  . Smokeless tobacco: Never Used  Substance and Sexual Activity  . Alcohol use: No  . Drug use: No  . Sexual activity: Not on file  Other Topics Concern  . Not on file  Social History Narrative   Patient is widowed. Patient is retired . 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 Patient has a 11th grade education. She has two children.   Social Determinants of Health   Financial Resource Strain:   . Difficulty of Paying Living Expenses:   Food Insecurity:   . Worried About Charity fundraiser in the Last Year:   . Arboriculturist in the Last Year:   Transportation Needs:   . Film/video editor (Medical):   Marland Kitchen Lack of Transportation (Non-Medical):   Physical Activity:   . Days of Exercise per Week:   . Minutes of Exercise per Session:   Stress:   . Feeling of Stress :   Social Connections:   . Frequency of Communication with Friends and Family:   . Frequency of Social Gatherings with Friends and Family:   . Attends Religious Services:   . Active Member of Clubs or Organizations:   . Attends Archivist Meetings:   Marland Kitchen Marital Status:   Intimate Partner Violence:   . Fear of Current or Ex-Partner:   . Emotionally Abused:   Marland Kitchen Physically Abused:   . Sexually  Abused:      PHYSICAL EXAM   Vitals:   02/13/20 1319  BP: 112/62  Pulse: 68  Temp: 98.3 F (36.8 C)  Weight: 213 lb (96.6 kg)  Height: '5\' 5"'  (1.651 m)    Not recorded      Body mass index is 35.45 kg/m.  PHYSICAL EXAMNIATION:  Gen: NAD, conversant, well nourised, well groomed                     Cardiovascular: Regular rate rhythm, no peripheral edema, warm, nontender. Eyes: Conjunctivae clear without exudates or hemorrhage Neck: Supple, no carotid bruits. Pulmonary: Clear to auscultation bilaterally   NEUROLOGICAL EXAM:  MENTAL STATUS: Speech:    Speech is normal; fluent and spontaneous with normal comprehension.  Cognition:     Orientation to time, place and person     Normal recent and remote memory     Normal Attention span and concentration     Normal Language, naming, repeating,spontaneous speech     Fund of knowledge   CRANIAL NERVES: CN II: Visual fields are full to confrontation. Pupils are round equal and briskly reactive to light. CN III, IV, VI: extraocular movement are normal. No ptosis. CN V: Facial sensation is intact to light touch CN VII: Face is symmetric with normal eye closure  CN VIII: Hearing is normal to causal conversation. CN IX, X: Phonation is normal. CN XI: Head turning and shoulder shrug are intact  MOTOR: There is no pronator drift of out-stretched arms. Muscle bulk and tone are normal. Muscle strength is normal.  REFLEXES: Reflexes are 2+ and symmetric at the biceps, triceps, trace at bilateral knees, and ankles. Plantar responses are flexor.  SENSORY: Decreased light touch, pinprick, allodynia at right lateral thigh  COORDINATION: There is no trunk or limb dysmetria noted.  GAIT/STANCE: Posture is normal. Gait is steady with normal steps, base, arm swing, and turning.  Romberg is absent.   DIAGNOSTIC DATA (LABS, IMAGING, TESTING) - I  reviewed patient records, labs, notes, testing and imaging myself where  available.   ASSESSMENT AND PLAN  ATTIE NAWABI is a 74 y.o. female   Daily headaches  Likely related to her chronic sinusitis, likely a component of medicine rebound headaches  MRI of the brain without contrast  ESR C-reactive protein  Nortriptyline 25 titrating to 50 mg as preventive medication  Stop daily Tylenol use  Right meralgia paresthetica  Emla gel mixed with diclofenac gel   Marcial Pacas, M.D. Ph.D.  [facility] [address] [place], [place] [address] Ph: [phone] Fax: [phone]  CC: Leeroy Cha, MD

## 2020-02-14 ENCOUNTER — Telehealth: Payer: Self-pay | Admitting: Neurology

## 2020-02-14 LAB — SEDIMENTATION RATE: Sed Rate: 64 mm/hr — ABNORMAL HIGH (ref 0–40)

## 2020-02-14 LAB — C-REACTIVE PROTEIN: CRP: 4 mg/L (ref 0–10)

## 2020-02-14 NOTE — Telephone Encounter (Signed)
I spoke to the patient and she verbalized understanding of the results. 

## 2020-02-14 NOTE — Telephone Encounter (Signed)
Laboratory evaluation showed elevated ESR, with normal C-reactive protein, this could be related to her chronic sinus infection, will await MRI brain result to make further decision.

## 2020-03-13 ENCOUNTER — Ambulatory Visit
Admission: RE | Admit: 2020-03-13 | Discharge: 2020-03-13 | Disposition: A | Discharge status: Home / Self Care | Payer: Medicare Other | Source: Ambulatory Visit | Attending: Neurology | Admitting: Neurology

## 2020-03-13 ENCOUNTER — Other Ambulatory Visit: Payer: Self-pay

## 2020-03-13 DIAGNOSIS — R519 Headache, unspecified: Secondary | ICD-10-CM

## 2020-03-13 DIAGNOSIS — G5711 Meralgia paresthetica, right lower limb: Secondary | ICD-10-CM

## 2020-03-14 ENCOUNTER — Telehealth: Payer: Self-pay | Admitting: Neurology

## 2020-03-14 NOTE — Telephone Encounter (Signed)
  IMPRESSION:   MRI brain (without) demonstrating: -Chronic frontal and maxillary sinusitis. -Brain parenchyma is normal.  Please call patient, MRI of the brain showed chronic bilateral frontal, maxillary sinusitis, there was no acute intracranial abnormalities.

## 2020-03-15 NOTE — Telephone Encounter (Signed)
I spoke to the patient and she verbalized understanding of the findings. 

## 2020-04-25 ENCOUNTER — Ambulatory Visit: Payer: Medicare Other | Admitting: Family Medicine

## 2020-05-03 ENCOUNTER — Encounter: Payer: Self-pay | Admitting: Neurology

## 2020-05-03 ENCOUNTER — Other Ambulatory Visit: Payer: Self-pay

## 2020-05-03 ENCOUNTER — Ambulatory Visit (INDEPENDENT_AMBULATORY_CARE_PROVIDER_SITE_OTHER): Payer: Medicare Other | Admitting: Neurology

## 2020-05-03 VITALS — BP 137/75 | HR 78 | Ht 65.0 in | Wt 218.0 lb

## 2020-05-03 DIAGNOSIS — G43909 Migraine, unspecified, not intractable, without status migrainosus: Secondary | ICD-10-CM

## 2020-05-03 NOTE — Progress Notes (Signed)
PATIENT: Pamela Hendricks DOB: 06-04-1946  REASON FOR VISIT: follow up HISTORY FROM: patient  HISTORY OF PRESENT ILLNESS: Today 05/03/20  HISTORY Pamela Hendricks is a 74 year old female, seen in request by her primary care physician Pamela Hendricks for evaluation  of head pressure, frequent transient sharp pain, initial evaluation was February 13, 2020.  She has past medical history of hypertension, hyperlipidemia, I saw her initially in 2016 for similar complaints,  She had long history of "sinus headache", with frequent sinus symptoms, runny nose, watering eyes, for many years she self treated with Claritin, around 2014, she had worsening of her headaches, presented to the emergency room few times around the period of time, I personally reviewed MRI of brain in May 2014, no acute abnormality, mild supratentorium small vessel disease, there is also evidence of acute left sphenoid sinus disease, chronic opacification of the right sphenoid sinus  She was started on Inderal 50 mg daily, which has helped her headache moderately, she also complains of early morning headache, was later referred to sleep study, confirmed obstructive sleep apnea, was prescribed CPAP machine, later nortriptyline 50 mg every night was also added on, her headache was apparently under good control for a while, she lost to follow-up.  She had bilateral revision endoscopic sinus surgery bilateral total ethmoidectomy, bilateral maxillary antrostomy, with removal of diseased tissue, lateral nasal frontal recess exploration by ENT  Dr. Wilburn Cornelia in November 2020  Despite the sinus surgery, she continue complains of returning of her headaches, she complains of constant pressure in her head," my head is as big as this room", in addition she complains of transient sharp piercing pain involving different spot in her skull, lasting for few seconds, she denies visual change, hearing loss, no jaw claudication,  She  continue complains of frequent sinus symptoms, nasal discharge, she does nasal wash few times each day, with dried discharge.  She was not able to tolerate her CPAP machine since her surgery, taking Tylenol at least 4 tablets on a daily basis  She also complains of sudden onset right lateral thigh area paresthesia above knee since February 2020, she had a history of bilateral knee replacement, mild low back pain,   Update May 03, 2020 SS: Laboratory evaluation revealed elevated ESR, normal CRP.  MRI of the brain showed chronic bilateral frontal, maxillary sinusitis, no acute intracranial abnormalities.  Remains on nortriptyline 50 mg at bedtime, Inderal LA 60 mg daily (from PCP).  Reports 1-2 headaches a week, will take Tylenol with good benefit.  No longer taking daily Tylenol.  With nortriptyline, no longer having bad headache to the back of her head.  Is yet to be back on CPAP, following sinus surgery.  She does note to be sleeping better with nortriptyline.  May have slight dry mouth as side effect.  Overall, doing well, is widowed for 16 years, remains quite active, doesn't let anything get her down, very active in church.  Presents today unaccompanied.   REVIEW OF SYSTEMS: Out of a complete 14 system review of symptoms, the patient complains only of the following symptoms, and all other reviewed systems are negative.  Headache   ALLERGIES: Allergies  Allergen Reactions  . Other Other (See Comments)    Watery eyes , itching - Grass, Rag Weed, Mold, Smoke    HOME MEDICATIONS: Outpatient Medications Prior to Visit  Medication Sig Dispense Refill  . acetaminophen (TYLENOL) 500 MG tablet Take 1,000 mg by mouth every 6 (six) hours as needed  for mild pain or headache.    Marland Kitchen aspirin EC 81 MG tablet Take 81 mg by mouth daily.    Marland Kitchen atorvastatin (LIPITOR) 10 MG tablet Take 10 mg by mouth at bedtime.     . benazepril (LOTENSIN) 40 MG tablet Take 40 mg by mouth daily.     . Calcium  Carb-Cholecalciferol (CALCIUM 600+D3 PO) Take 1 tablet by mouth 2 (two) times daily.    . celecoxib (CELEBREX) 200 MG capsule Take 200 mg by mouth every other day.     . fluocinonide cream (LIDEX) 0.05 % APP EXTERNALLY UNDER THE BREAST BID    . fluticasone (FLONASE) 50 MCG/ACT nasal spray Place into both nostrils daily as needed for allergies or rhinitis.    Marland Kitchen lidocaine-prilocaine (EMLA) cream Apply 1 application topically 3 (three) times daily as needed. 30 g 11  . loratadine (CLARITIN) 10 MG tablet Take by mouth.    . Multiple Vitamins-Minerals (MULTIVITAMIN WITH MINERALS) tablet Take 1 tablet by mouth daily.    . nortriptyline (PAMELOR) 25 MG capsule Take 2 capsules (50 mg total) by mouth at bedtime. 60 capsule 11  . pantoprazole (PROTONIX) 40 MG tablet Take 40 mg by mouth daily.     . propranolol ER (INDERAL LA) 60 MG 24 hr capsule Take 1 capsule (60 mg total) by mouth at bedtime. Please call [phone] to schedule appt. 30 capsule 2  . hydrochlorothiazide (HYDRODIURIL) 25 MG tablet Take 25 mg by mouth daily.     No facility-administered medications prior to visit.    PAST MEDICAL HISTORY: Past Medical History:  Diagnosis Date  . Arthritis   . Carotid artery disease ([place])    a. duplex 07/2017 - 1-39% stenosis bilaterally.  . Chronic frontal sinusitis   . Elevated liver enzymes    per pt  . Environmental and seasonal allergies   . GERD (gastroesophageal reflux disease)   . Hyperlipidemia   . Hypertension   . Migraines    sinus headaches, no longer having migraines  . OSA (obstructive sleep apnea)    CPAP  . Peripheral neuropathy   . Peripheral vascular disease ([place])    carotid blockage - is followed by Dr. Marlou Porch  . Pre-diabetes   . Snores   . Wears glasses     PAST SURGICAL HISTORY: Past Surgical History:  Procedure Laterality Date  . ABDOMINAL HYSTERECTOMY  1970   Total HYST  . BREAST BIOPSY Bilateral 2001   benign  . BREAST EXCISIONAL BIOPSY Right 2013   sclerosing  lesion  . BREAST LUMPECTOMY WITH RADIOACTIVE SEED LOCALIZATION Left 08/31/2018   Procedure: BREAST LUMPECTOMY WITH RADIOACTIVE SEED LOCALIZATION;  Surgeon: Fanny Skates, MD;  Location: [facility];  Service: General;  Laterality: Left;  . COLONOSCOPY    . JOINT REPLACEMENT Left 2001   Left Knee Replacement  . KNEE ARTHROSCOPY Left    lt  . NASAL SINUS SURGERY    . NASAL SINUS SURGERY Bilateral 09/30/2019   Procedure: ENDOSCOPIC SINUS SURGERY;  Surgeon: Jerrell Belfast, MD;  Location: [facility];  Service: ENT;  Laterality: Bilateral;  . TOE SURGERY     left foot digit # 2  . TOTAL KNEE ARTHROPLASTY Right 10/12/2018   Procedure: TOTAL KNEE ARTHROPLASTY;  Surgeon: Melrose Nakayama, MD;  Location: [facility];  Service: Orthopedics;  Laterality: Right;  . WISDOM TOOTH EXTRACTION      FAMILY HISTORY: Family History  Problem Relation Age of Onset  . Cancer Mother   . Hypertension Mother   .  Hypertension Father   . Heart disease Father        MI around 78, died at 10  . Cancer Brother        Prostate Ca  . Cancer Sister        Brain Ca  . CAD Daughter        Had bypass in her 22s (type 1 diabetic), died at 63    SOCIAL HISTORY: Social History   Socioeconomic History  . Marital status: Widowed    Spouse name: Laverna Peace  . Number of children: 2  . Years of education: 72  . Highest education level: Not on file  Occupational History  . Occupation: retired    Comment: works partime with vendors  Tobacco Use  . Smoking status: Never Smoker  . Smokeless tobacco: Never Used  Vaping Use  . Vaping Use: Never used  Substance and Sexual Activity  . Alcohol use: No  . Drug use: No  . Sexual activity: Not on file  Other Topics Concern  . Not on file  Social History Narrative   Patient is widowed. Patient is retired . 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 Patient has a 11th grade education. She has two children.   Social Determinants of Health   Financial Resource Strain:   .  Difficulty of Paying Living Expenses:   Food Insecurity:   . Worried About Charity fundraiser in the Last Year:   . Arboriculturist in the Last Year:   Transportation Needs:   . Film/video editor (Medical):   Marland Kitchen Lack of Transportation (Non-Medical):   Physical Activity:   . Days of Exercise per Week:   . Minutes of Exercise per Session:   Stress:   . Feeling of Stress :   Social Connections:   . Frequency of Communication with Friends and Family:   . Frequency of Social Gatherings with Friends and Family:   . Attends Religious Services:   . Active Member of Clubs or Organizations:   . Attends Archivist Meetings:   Marland Kitchen Marital Status:   Intimate Partner Violence:   . Fear of Current or Ex-Partner:   . Emotionally Abused:   Marland Kitchen Physically Abused:   . Sexually Abused:    PHYSICAL EXAM  Vitals:   05/03/20 1056  BP: 137/75  Pulse: 78  Weight: 218 lb (98.9 kg)  Height: _0  (1.651 m)   Body mass index is 36.28 kg/m.  Generalized: Well developed, in no acute distress   Neurological examination  Mentation: Alert oriented to time, place, history taking. Follows all commands speech and language fluent Cranial nerve II-XII: Pupils were equal round reactive to light. Extraocular movements were full, visual field were full on confrontational test. Facial sensation and strength were normal. Head turning and shoulder shrug were normal and symmetric. Motor: The motor testing reveals 5 over 5 strength of all 4 extremities. Good symmetric motor tone is noted throughout.  Sensory: Sensory testing is intact to soft touch on all 4 extremities. No evidence of extinction is noted.  Coordination: Cerebellar testing reveals good finger-nose-finger and heel-to-shin bilaterally.  Gait and station: Gait is normal.  Reflexes: Deep tendon reflexes are symmetric and normal bilaterally.   DIAGNOSTIC DATA (LABS, IMAGING, TESTING) - I reviewed patient records, labs, notes, testing and  imaging myself where available.  Lab Results  Component Value Date   WBC 8.7 09/27/2019   HGB 12.5 09/27/2019   HCT 39.5 09/27/2019  MCV 92.1 09/27/2019   PLT 306 09/27/2019      Component Value Date/Time   NA 139 09/27/2019 0920   NA 139 11/22/2014 1050   K 4.2 09/27/2019 0920   CL 104 09/27/2019 0920   CO2 25 09/27/2019 0920   GLUCOSE 104 (H) 09/27/2019 0920   BUN 13 09/27/2019 0920   BUN 11 11/22/2014 1050   CREATININE 0.61 09/27/2019 0920   CALCIUM 9.5 09/27/2019 0920   PROT 6.9 09/27/2019 0920   PROT 6.1 12/14/2017 1145   ALBUMIN 3.6 09/27/2019 0920   ALBUMIN 4.0 12/14/2017 1145   AST 41 09/27/2019 0920   ALT 57 (H) 09/27/2019 0920   ALKPHOS 69 09/27/2019 0920   BILITOT 0.6 09/27/2019 0920   BILITOT 0.3 12/14/2017 1145   GFRNONAA >60 09/27/2019 0920   GFRAA >60 09/27/2019 0920   Lab Results  Component Value Date   CHOL 124 11/16/2017   HDL 49 11/16/2017   LDLCALC 57 11/16/2017   TRIG 89 11/16/2017   CHOLHDL 2.5 11/16/2017   No results found for: HGBA1C No results found for: VITAMINB12 Lab Results  Component Value Date   TSH 2.560 11/22/2014   ASSESSMENT AND PLAN 74 y.o. year old female  has a past medical history of Arthritis, Carotid artery disease ([place]), Chronic frontal sinusitis, Elevated liver enzymes, Environmental and seasonal allergies, GERD (gastroesophageal reflux disease), Hyperlipidemia, Hypertension, Migraines, OSA (obstructive sleep apnea), Peripheral neuropathy, Peripheral vascular disease ([place]), Pre-diabetes, Snores, and Wears glasses. here with:  1.  Daily headaches -Much improved, now 1-2 headaches a week, taking less Tylenol only twice a week, headaches respond well to Tylenol -Likely related to chronic sinusitis, component of medicine rebound headaches, post sinus surgery November 20 -MRI of the brain showed chronic bilateral frontal, maxillary sinusitis, no acute intracranial abnormalities -ESR elevated, CRP was normal, may continue to  follow this, likely related to chronic sinusitis -Continue nortriptyline 25 mg, 50 mg at bedtime -Also taking Inderal LA 60 mg daily for BP and migraine prevention -Has yet to resume CPAP following sinus surgery, is sleeping better with nortriptyline -Follow-up in 6 months or sooner if needed  I spent 20 minutes of face-to-face and non-face-to-face time with patient.  This included previsit chart review, lab review, study review, order entry, electronic health record documentation, patient education.  Butler Denmark, AGNP-C, DNP 05/03/2020, 11:36 AM [facility] [address] [place], [place] [address] [phone]

## 2020-05-03 NOTE — Patient Instructions (Signed)
Continue current medications °See you back in 6 months  °

## 2020-05-17 ENCOUNTER — Ambulatory Visit: Payer: Medicare Other | Admitting: Neurology

## 2020-06-19 ENCOUNTER — Encounter: Payer: Self-pay | Admitting: Family Medicine

## 2020-06-19 ENCOUNTER — Ambulatory Visit (INDEPENDENT_AMBULATORY_CARE_PROVIDER_SITE_OTHER): Payer: Medicare Other | Admitting: Family Medicine

## 2020-06-19 VITALS — BP 136/74 | HR 69 | Ht 65.0 in | Wt 209.0 lb

## 2020-06-19 DIAGNOSIS — G4733 Obstructive sleep apnea (adult) (pediatric): Secondary | ICD-10-CM | POA: Diagnosis not present

## 2020-06-19 DIAGNOSIS — Z9989 Dependence on other enabling machines and devices: Secondary | ICD-10-CM

## 2020-06-19 DIAGNOSIS — R519 Headache, unspecified: Secondary | ICD-10-CM

## 2020-06-19 NOTE — Progress Notes (Addendum)
PATIENT: Pamela Hendricks DOB: [birth year]  REASON FOR VISIT: follow up Pamela FROM: patient  Chief Complaint  Patient presents with  . Follow-up    rm 6 here for a f/u on OSA. Pt is having no problems with her cpap.     Pamela OF PRESENT ILLNESS:  Pamela Hendricks a 74 year old female, seen in request byher primary care physician Varadarajan, Rupashreefor evaluation of head pressure, frequent transient sharp pain, initial evaluation was February 13, 2020.  She has past medical Pamela of hypertension, hyperlipidemia, I saw her initially in 2016 for similar complaints,  She had long Pamela of"sinus headache",with frequent sinus symptoms, runny nose, watering eyes, for many years she self treated with Claritin, around 2014, she had worsening of her headaches, presented to the emergency room few times around the period of time, I personally reviewed MRI of brain in May 2014, no acute abnormality, mild supratentorium small vessel disease, there is also evidence of acute left sphenoid sinus disease, chronic opacification of the right sphenoid sinus  She was started on Inderal 50 mg daily, which has helped her headache moderately, she also complains of early morning headache, was later referred to sleep study, confirmed obstructive sleep apnea, was prescribed CPAP machine, later nortriptyline 50 mg every night was also added on, her headache was apparently under good control for a while, she lost to follow-up.  She hadbilateral revision endoscopic sinus surgery bilateral total ethmoidectomy, bilateral maxillary antrostomy, with removal of diseased tissue, lateral nasal frontal recess explorationby ENT Dr. Cathleen Corti November 2020  Despite the sinus surgery, she continue complains of returning of her headaches, she complains of constant pressure in her head,"my head is as big as this room",in addition she complains of transient sharp piercing pain involving  different spot in her skull, lasting for few seconds, she denies visual change, hearing loss, no jaw claudication,  She continue complains of frequent sinus symptoms, nasal discharge, she does nasal wash few times each day, with dried discharge. She was not able to tolerate her CPAP machine since her surgery, taking Tylenol at least 4 tablets on a daily basis  She also complains of sudden onset right lateral thigh area paresthesia above knee since February 2020, she had a Pamela of bilateral knee replacement, mild low back pain,   Update May 03, 2020 SS: Laboratory evaluation revealed elevated ESR, normal CRP.  MRI of the brain showed chronic bilateral frontal, maxillary sinusitis, no acute intracranial abnormalities.  Remains on nortriptyline 50 mg at bedtime, Inderal LA 60 mg daily (from PCP).  Reports 1-2 headaches a week, will take Tylenol with good benefit.  No longer taking daily Tylenol.  With nortriptyline, no longer having bad headache to the back of her head.  Is yet to be back on CPAP, following sinus surgery.  She does note to be sleeping better with nortriptyline.  May have slight dry mouth as side effect.  Overall, doing well, is widowed for 16 years, remains quite active, doesn't let anything get her down, very active in church.  Presents today unaccompanied.  Update 07/02/20 ALL: Pamela Hendricks is a 74 y.o. female here today for follow up for OSA on CPAP therapy and headaches. She continues Inderal and nortriptyline. She reports headaches are well managed. She rarely has a headache. She can abort headache with Tylenol.   She recently restarted CPAP therapy. She has been compliant with use over the past 24 days. She has not noted significant changes in how she  feels but contributes this to feeling well, overall. She has all needed supplies. She denies concerns with CPAP machine.   Compliance report dated 05/19/2020 through 06/17/2020 reveals that she used CPAP twenty-four of the past  30 days for compliance of 80%.  She used CPAP greater than 4 hours twenty-four of the past 30 days for compliance of 80%.  Average usage was 6 hours and 29 minutes.  Residual AHI was slightly elevated at 6.1 on 5 to 18 cm of water and an EPR of two.  There was no significant leak noted.    REVIEW OF SYSTEMS: Out of a complete 14 system review of symptoms, the patient complains only of the following symptoms, headache and fatigue and all other reviewed systems are negative.  ALLERGIES: Allergies  Allergen Reactions  . Other Other (See Comments)    Watery eyes , itching - Grass, Rag Weed, Mold, Smoke    HOME MEDICATIONS: Outpatient Medications Prior to Visit  Medication Sig Dispense Refill  . acetaminophen (TYLENOL) 500 MG tablet Take 1,000 mg by mouth every 6 (six) hours as needed for mild pain or headache.    Marland Kitchen aspirin EC 81 MG tablet Take 81 mg by mouth daily.    Marland Kitchen atorvastatin (LIPITOR) 10 MG tablet Take 10 mg by mouth at bedtime.     . benazepril (LOTENSIN) 40 MG tablet Take 40 mg by mouth daily.     . Calcium Carb-Cholecalciferol (CALCIUM 600+D3 PO) Take 1 tablet by mouth 2 (two) times daily.    . celecoxib (CELEBREX) 200 MG capsule Take 200 mg by mouth every other day.     . fluocinonide cream (LIDEX) 0.05 % APP EXTERNALLY UNDER THE BREAST BID    . fluticasone (FLONASE) 50 MCG/ACT nasal spray Place into both nostrils daily as needed for allergies or rhinitis.    Marland Kitchen lidocaine-prilocaine (EMLA) cream Apply 1 application topically 3 (three) times daily as needed. 30 g 11  . loratadine (CLARITIN) 10 MG tablet Take by mouth.    . Multiple Vitamins-Minerals (MULTIVITAMIN WITH MINERALS) tablet Take 1 tablet by mouth daily.    . nortriptyline (PAMELOR) 25 MG capsule Take 2 capsules (50 mg total) by mouth at bedtime. 60 capsule 11  . pantoprazole (PROTONIX) 40 MG tablet Take 40 mg by mouth daily.     . propranolol ER (INDERAL LA) 60 MG 24 hr capsule Take 1 capsule (60 mg total) by mouth at  bedtime. Please call [phone] to schedule appt. 30 capsule 2   No facility-administered medications prior to visit.    PAST MEDICAL Pamela: Past Medical Pamela:  Diagnosis Date  . Arthritis   . Carotid artery disease ([place])    a. duplex 07/2017 - 1-39% stenosis bilaterally.  . Chronic frontal sinusitis   . Elevated liver enzymes    per pt  . Environmental and seasonal allergies   . GERD (gastroesophageal reflux disease)   . Hyperlipidemia   . Hypertension   . Migraines    sinus headaches, no longer having migraines  . OSA (obstructive sleep apnea)    CPAP  . Peripheral neuropathy   . Peripheral vascular disease ([place])    carotid blockage - is followed by Dr. Marlou Porch  . Pre-diabetes   . Snores   . Wears glasses     PAST SURGICAL Pamela: Past Surgical Pamela:  Procedure Laterality Date  . ABDOMINAL HYSTERECTOMY  1970   Total HYST  . BREAST BIOPSY Bilateral 2001   benign  . BREAST EXCISIONAL BIOPSY  Right 2013   sclerosing lesion  . BREAST LUMPECTOMY WITH RADIOACTIVE SEED LOCALIZATION Left 08/31/2018   Procedure: BREAST LUMPECTOMY WITH RADIOACTIVE SEED LOCALIZATION;  Surgeon: Fanny Skates, MD;  Location: [facility];  Service: General;  Laterality: Left;  . COLONOSCOPY    . JOINT REPLACEMENT Left 2001   Left Knee Replacement  . KNEE ARTHROSCOPY Left    lt  . NASAL SINUS SURGERY    . NASAL SINUS SURGERY Bilateral 09/30/2019   Procedure: ENDOSCOPIC SINUS SURGERY;  Surgeon: Jerrell Belfast, MD;  Location: [facility];  Service: ENT;  Laterality: Bilateral;  . TOE SURGERY     left foot digit # 2  . TOTAL KNEE ARTHROPLASTY Right 10/12/2018   Procedure: TOTAL KNEE ARTHROPLASTY;  Surgeon: Melrose Nakayama, MD;  Location: [facility];  Service: Orthopedics;  Laterality: Right;  . WISDOM TOOTH EXTRACTION      FAMILY Pamela: Family Pamela  Problem Relation Age of Onset  . Cancer Mother   . Hypertension Mother   . Hypertension Father   . Heart disease Father        MI around 79,  died at 44  . Cancer Brother        Prostate Ca  . Cancer Sister        Brain Ca  . CAD Daughter        Had bypass in her 37s (type 1 diabetic), died at 49    SOCIAL Pamela: Social Pamela   Socioeconomic Pamela  . Marital status: Widowed    Spouse name: Laverna Peace  . Number of children: 2  . Years of education: 47  . Highest education level: Not on file  Occupational Pamela  . Occupation: retired    Comment: works partime with vendors  Tobacco Use  . Smoking status: Never Smoker  . Smokeless tobacco: Never Used  Vaping Use  . Vaping Use: Never used  Substance and Sexual Activity  . Alcohol use: No  . Drug use: No  . Sexual activity: Not on file  Other Topics Concern  . Not on file  Social Pamela Narrative   Patient is widowed. Patient is retired . 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 Patient has a 11th grade education. She has two children.   Social Determinants of Health   Financial Resource Strain:   . Difficulty of Paying Living Expenses: Not on file  Food Insecurity:   . Worried About Charity fundraiser in the Last Year: Not on file  . Ran Out of Food in the Last Year: Not on file  Transportation Needs:   . Lack of Transportation (Medical): Not on file  . Lack of Transportation (Non-Medical): Not on file  Physical Activity:   . Days of Exercise per Week: Not on file  . Minutes of Exercise per Session: Not on file  Stress:   . Feeling of Stress : Not on file  Social Connections:   . Frequency of Communication with Friends and Family: Not on file  . Frequency of Social Gatherings with Friends and Family: Not on file  . Attends Religious Services: Not on file  . Active Member of Clubs or Organizations: Not on file  . Attends Archivist Meetings: Not on file  . Marital Status: Not on file  Intimate Partner Violence:   . Fear of Current or Ex-Partner: Not on file  . Emotionally Abused: Not on file  . Physically Abused: Not on file   . Sexually Abused: Not on  file      PHYSICAL EXAM  Vitals:   06/19/20 1035  BP: 136/74  Pulse: 69  Weight: 209 lb (94.8 kg)  Height: '5\' 5"'  (1.651 m)   Body mass index is 34.78 kg/m.  Generalized: Well developed, in no acute distress  Cardiology: normal rate and rhythm, no murmur noted Respiratory: clear to auscultation bilaterally  Neurological examination  Mentation: Alert oriented to time, place, Pamela taking. Follows all commands speech and language fluent Cranial nerve II-XII: Pupils were equal round reactive to light. Extraocular movements were full, visual field were full  Motor: The motor testing reveals 5 over 5 strength of all 4 extremities. Good symmetric motor tone is noted throughout.  Gait and station: Gait is normal.    DIAGNOSTIC DATA (LABS, IMAGING, TESTING) - I reviewed patient records, labs, notes, testing and imaging myself where available.  No flowsheet data found.   Lab Results  Component Value Date   WBC 8.7 09/27/2019   HGB 12.5 09/27/2019   HCT 39.5 09/27/2019   MCV 92.1 09/27/2019   PLT 306 09/27/2019      Component Value Date/Time   NA 139 09/27/2019 0920   NA 139 11/22/2014 1050   K 4.2 09/27/2019 0920   CL 104 09/27/2019 0920   CO2 25 09/27/2019 0920   GLUCOSE 104 (H) 09/27/2019 0920   BUN 13 09/27/2019 0920   BUN 11 11/22/2014 1050   CREATININE 0.61 09/27/2019 0920   CALCIUM 9.5 09/27/2019 0920   PROT 6.9 09/27/2019 0920   PROT 6.1 12/14/2017 1145   ALBUMIN 3.6 09/27/2019 0920   ALBUMIN 4.0 12/14/2017 1145   AST 41 09/27/2019 0920   ALT 57 (H) 09/27/2019 0920   ALKPHOS 69 09/27/2019 0920   BILITOT 0.6 09/27/2019 0920   BILITOT 0.3 12/14/2017 1145   GFRNONAA >60 09/27/2019 0920   GFRAA >60 09/27/2019 0920   Lab Results  Component Value Date   CHOL 124 11/16/2017   HDL 49 11/16/2017   LDLCALC 57 11/16/2017   TRIG 89 11/16/2017   CHOLHDL 2.5 11/16/2017   No results found for: HGBA1C No results found for:  VITAMINB12 Lab Results  Component Value Date   TSH 2.560 11/22/2014       ASSESSMENT AND PLAN 74 y.o. year old female  has a past medical Pamela of Arthritis, Carotid artery disease ([place]), Chronic frontal sinusitis, Elevated liver enzymes, Environmental and seasonal allergies, GERD (gastroesophageal reflux disease), Hyperlipidemia, Hypertension, Migraines, OSA (obstructive sleep apnea), Peripheral neuropathy, Peripheral vascular disease ([place]), Pre-diabetes, Snores, and Wears glasses. here with     ICD-10-CM   1. Obstructive sleep apnea treated with continuous positive airway pressure (CPAP)  G47.33 For home use only DME continuous positive airway pressure (CPAP)   Z99.89   2. Chronic daily headache  R51.9     Pamela Hendricks reports that, overall, she is doing very well.  She feels that headaches are well-managed.  She rarely takes Tylenol for abortive therapy.  She has resumed CPAP therapy.  Compliance report reveals acceptable compliance.  AHI is slightly elevated at 6.1.  I have asked that she continue using CPAP nightly and for greater than 4 hours each night.  I will repeat a download in 4 to 6 weeks to assess AHI.  We will consider pressure changes as needed based on full 30-day compliance report.  She was encouraged to continue healthy lifestyle habits.  Adequate hydration encouraged.  She will follow-up with cigarette as previously scheduled in December.  She was  advised that we should be able to continue headache and CPAP follow-up in the same visit to prevent multiple trips to our office.  She verbalizes appreciation.   Addendum: I have reviewed compliance report dated 06/02/2020 through 07/01/2020 which reveals she used CPAP 30 of the past 30 days.  She CPAP greater than 4 hours all 30 days.  Residual AHI remains elevated at 7.1 on 5 to 18 cm of water.  Pressures in the 95th percentile at 13.7 cm of water pressure and maximum of 14.4.  Patient was noted to have mostly obstructive apneas (5.1).   We will increase minimum pressure to 6 cm of water and maximum pressure to 19 cm of water.  I will recheck CPAP download in 4 to 6 weeks to assess response and AHI.  Addendum 08/23/2020: I have reviewed the patient's CPAP download over the past 30 days.  Residual AHI remains elevated at 8.3 on 6 to 18 cm of water pressure.  Pressure in the 95th percentile 14.1.  I will order a CPAP titration.   Orders Placed This Encounter  Procedures  . For home use only DME continuous positive airway pressure (CPAP)    Please increase min pressure to 6cmH20 and max pressure to 19cmH20.    Order Specific Question:   Length of Need    Answer:   Lifetime    Order Specific Question:   Patient has OSA or probable OSA    Answer:   Yes    Order Specific Question:   Is the patient currently using CPAP in the home    Answer:   Yes    Order Specific Question:   Settings    Answer:   Other see comments    Order Specific Question:   CPAP supplies needed    Answer:   Mask, headgear, cushions, filters, heated tubing and water chamber     No orders of the defined types were placed in this encounter.     I spent 20 minutes with the patient. 50% of this time was spent counseling and educating patient on plan of care and medications.    Debbora Presto, FNP-C 07/02/2020, 1:36 PM [facility] [address] [place], [place] [address] [phone]

## 2020-06-19 NOTE — Patient Instructions (Signed)
Please continue using your CPAP regularly. While your insurance requires that you use CPAP at least 4 hours each night on 70% of the nights, I recommend, that you not skip any nights and use it throughout the night if you can. Getting used to CPAP and staying with the treatment long term does take time and patience and discipline. Untreated obstructive sleep apnea when it is moderate to severe can have an adverse impact on cardiovascular health and raise her risk for heart disease, arrhythmias, hypertension, congestive heart failure, stroke and diabetes. Untreated obstructive sleep apnea causes sleep disruption, nonrestorative sleep, and sleep deprivation. This can have an impact on your day to day functioning and cause daytime sleepiness and impairment of cognitive function, memory loss, mood disturbance, and problems focussing. Using CPAP regularly can improve these symptoms.   Keep using CPAP therapy every night and greater than 4 hours every night. I will review a download for full 30 days in 4-6 weeks. We will make adjustments as needed.   Follow up with Judson Roch in December as planned.    Sleep Apnea Sleep apnea affects breathing during sleep. It causes breathing to stop for a short time or to become shallow. It can also increase the risk of:  Heart attack.  Stroke.  Being very overweight (obese).  Diabetes.  Heart failure.  Irregular heartbeat. The goal of treatment is to help you breathe normally again. What are the causes? There are three kinds of sleep apnea:  Obstructive sleep apnea. This is caused by a blocked or collapsed airway.  Central sleep apnea. This happens when the brain does not send the right signals to the muscles that control breathing.  Mixed sleep apnea. This is a combination of obstructive and central sleep apnea. The most common cause of this condition is a collapsed or blocked airway. This can happen if:  Your throat muscles are too relaxed.  Your  tongue and tonsils are too large.  You are overweight.  Your airway is too small. What increases the risk?  Being overweight.  Smoking.  Having a small airway.  Being older.  Being female.  Drinking alcohol.  Taking medicines to calm yourself (sedatives or tranquilizers).  Having family members with the condition. What are the signs or symptoms?  Trouble staying asleep.  Being sleepy or tired during the day.  Getting angry a lot.  Loud snoring.  Headaches in the morning.  Not being able to focus your mind (concentrate).  Forgetting things.  Less interest in sex.  Mood swings.  Personality changes.  Feelings of sadness (depression).  Waking up a lot during the night to pee (urinate).  Dry mouth.  Sore throat. How is this diagnosed?  Your medical history.  A physical exam.  A test that is done when you are sleeping (sleep study). The test is most often done in a sleep lab but may also be done at home. How is this treated?   Sleeping on your side.  Using a medicine to get rid of mucus in your nose (decongestant).  Avoiding the use of alcohol, medicines to help you relax, or certain pain medicines (narcotics).  Losing weight, if needed.  Changing your diet.  Not smoking.  Using a machine to open your airway while you sleep, such as: ? An oral appliance. This is a mouthpiece that shifts your lower jaw forward. ? A CPAP device. This device blows air through a mask when you breathe out (exhale). ? An EPAP device. This has  valves that you put in each nostril. ? A BPAP device. This device blows air through a mask when you breathe in (inhale) and breathe out.  Having surgery if other treatments do not work. It is important to get treatment for sleep apnea. Without treatment, it can lead to:  High blood pressure.  Coronary artery disease.  In men, not being able to have an erection (impotence).  Reduced thinking ability. Follow these  instructions at home: Lifestyle  Make changes that your doctor recommends.  Eat a healthy diet.  Lose weight if needed.  Avoid alcohol, medicines to help you relax, and some pain medicines.  Do not use any products that contain nicotine or tobacco, such as cigarettes, e-cigarettes, and chewing tobacco. If you need help quitting, ask your doctor. General instructions  Take over-the-counter and prescription medicines only as told by your doctor.  If you were given a machine to use while you sleep, use it only as told by your doctor.  If you are having surgery, make sure to tell your doctor you have sleep apnea. You may need to bring your device with you.  Keep all follow-up visits as told by your doctor. This is important. Contact a doctor if:  The machine that you were given to use during sleep bothers you or does not seem to be working.  You do not get better.  You get worse. Get help right away if:  Your chest hurts.  You have trouble breathing in enough air.  You have an uncomfortable feeling in your back, arms, or stomach.  You have trouble talking.  One side of your body feels weak.  A part of your face is hanging down. These symptoms may be an emergency. Do not wait to see if the symptoms will go away. Get medical help right away. Call your local emergency services (911 in the [place] Do not drive yourself to the hospital. Summary  This condition affects breathing during sleep.  The most common cause is a collapsed or blocked airway.  The goal of treatment is to help you breathe normally while you sleep. This information is not intended to replace advice given to you by your health care provider. Make sure you discuss any questions you have with your health care provider. Document Revised: 08/13/2018 Document Reviewed: 06/22/2018 Elsevier Patient Education  [place].

## 2020-06-26 NOTE — Progress Notes (Signed)
I have reviewed and agreed above plan. 

## 2020-07-02 ENCOUNTER — Telehealth: Payer: Self-pay | Admitting: Family Medicine

## 2020-07-02 NOTE — Telephone Encounter (Signed)
I called pt and realyed that her cpap report per AL/NP as listed . Pt had a lot of questions. I explained that pressure to be changed to 6-19cm h20 to decrease the # of apneas that she is having to be below 5.0.  Will reassess in 4-6 wks.  She verbalized understanding.She is to call sooner if needed.

## 2020-07-02 NOTE — Addendum Note (Signed)
Addended by: Debbora Presto L on: 07/02/2020 01:36 PM   Modules accepted: Orders

## 2020-07-02 NOTE — Telephone Encounter (Signed)
Towanda Octave, RN Thank you! We will get it updated and call the patient.

## 2020-07-02 NOTE — Telephone Encounter (Signed)
See last office note 

## 2020-07-10 ENCOUNTER — Encounter: Payer: Self-pay | Admitting: *Deleted

## 2020-08-06 ENCOUNTER — Other Ambulatory Visit: Payer: Self-pay | Admitting: Internal Medicine

## 2020-08-06 DIAGNOSIS — Z1231 Encounter for screening mammogram for malignant neoplasm of breast: Secondary | ICD-10-CM

## 2020-08-20 NOTE — Progress Notes (Signed)
CARDIOLOGY OFFICE NOTE  Date:  08/29/2020    Ulla Potash Date of Birth: 1946-06-10 Medical Record #[id number]  PCP:  Leeroy Cha, MD  Cardiologist:  [facility]   Chief Complaint  Patient presents with  . Follow-up    Seen for Dr. Marlou Porch    History of Present Illness: DEATRA MCMAHEN is a 74 y.o. female who presents today for a follow up visit. Seen for Dr. Marlou Porch.   She has a history of carotid disease, GERD, HTN, HLD and OSA. Has had prior mobile screening that showed atherosclerotic changes of the aorta. Dr. Doreatha Lew and I used to take care of her husband Laverna Peace - he died 75 years ago) and her daughter Johnathan Hausen who died in 02/16/2004) who have both passed away many years ago due to CAD.   She has had some episodic chest pain with atypical features - Myoview in 15-Feb-2017 stable. Last carotid study in 2017-02-15. She is on statin, aspirin and beta blocker.   Last seen a year ago by Dr. Marlou Porch for pre op clearance - needing ENT surgery. Carotid doppler was updated.   Comes in today. Here alone. Nell feels like she is doing ok. She is more bothered by sinusitis. No chest pain. Breathing is good. She continues to struggle with weight loss -admits she likes to eat - her portions are too much. She is trying to stay active. She had labs back in August. She is not vaccinated for COVID - she ended up having COVID back in August - found out when tested for colonoscopy which was cancelled. She does want a flu shot - was told we did not do that.   Past Medical History:  Diagnosis Date  . Arthritis   . Carotid artery disease ([place])    a. duplex 07/2017 - 1-39% stenosis bilaterally.  . Chronic frontal sinusitis   . Elevated liver enzymes    per pt  . Environmental and seasonal allergies   . GERD (gastroesophageal reflux disease)   . Hyperlipidemia   . Hypertension   . Migraines    sinus headaches, no longer having migraines  . OSA (obstructive sleep apnea)    CPAP  . Peripheral  neuropathy   . Peripheral vascular disease ([place])    carotid blockage - is followed by Dr. Marlou Porch  . Pre-diabetes   . Snores   . Wears glasses     Past Surgical History:  Procedure Laterality Date  . ABDOMINAL HYSTERECTOMY  1970   Total HYST  . BREAST BIOPSY Bilateral 2000-02-16   benign  . BREAST EXCISIONAL BIOPSY Right 16-Feb-2012   sclerosing lesion  . BREAST LUMPECTOMY WITH RADIOACTIVE SEED LOCALIZATION Left 08/31/2018   Procedure: BREAST LUMPECTOMY WITH RADIOACTIVE SEED LOCALIZATION;  Surgeon: Fanny Skates, MD;  Location: [place];  Service: General;  Laterality: Left;  . COLONOSCOPY    . JOINT REPLACEMENT Left February 16, 2000   Left Knee Replacement  . KNEE ARTHROSCOPY Left    lt  . NASAL SINUS SURGERY    . NASAL SINUS SURGERY Bilateral 09/30/2019   Procedure: ENDOSCOPIC SINUS SURGERY;  Surgeon: Jerrell Belfast, MD;  Location: [place];  Service: ENT;  Laterality: Bilateral;  . TOE SURGERY     left foot digit # 2  . TOTAL KNEE ARTHROPLASTY Right 10/12/2018   Procedure: TOTAL KNEE ARTHROPLASTY;  Surgeon: Melrose Nakayama, MD;  Location: [place];  Service: Orthopedics;  Laterality: Right;  . WISDOM TOOTH EXTRACTION       Medications: No  outpatient medications have been marked as taking for the 08/29/20 encounter (Office Visit) with Burtis Junes, NP.     Allergies: Allergies  Allergen Reactions  . Other Other (See Comments)    Watery eyes , itching - Grass, Rag Weed, Mold, Smoke    Social History: The patient  reports that she has never smoked. She has never used smokeless tobacco. She reports that she does not drink alcohol and does not use drugs.   Family History: The patient's family history includes CAD in her daughter; Cancer in her brother, mother, and sister; Heart disease in her father; Hypertension in her father and mother.   Review of Systems: Please see the history of present illness.   All other systems are reviewed and negative.   Physical Exam: VS:  BP (!) 90/50 Comment:  left arm 100/60  Pulse 74   Ht 5\' 5"  (1.651 m)   Wt 213 lb 6.4 oz (96.8 kg)   SpO2 99%   BMI 35.51 kg/m  .  BMI Body mass index is 35.51 kg/m.  Wt Readings from Last 3 Encounters:  08/29/20 213 lb 6.4 oz (96.8 kg)  06/19/20 209 lb (94.8 kg)  05/03/20 218 lb (98.9 kg)    General: Pleasant. Alert and in no acute distress.  She remains obese.   Cardiac: Regular rate and rhythm. No murmurs, rubs, or gallops. No edema.  Respiratory:  Lungs are clear to auscultation bilaterally with normal work of breathing.  GI: Soft and nontender.  MS: No deformity or atrophy. Gait and ROM intact.  Skin: Warm and dry. Color is normal.  Neuro:  Strength and sensation are intact and no gross focal deficits noted.  Psych: Alert, appropriate and with normal affect.   LABORATORY DATA:  EKG:  EKG is ordered today.  Personally reviewed by me. This demonstrates NSR - HR is 74 - lateral T wave inversion noted - unchanged.   Lab Results  Component Value Date   WBC 8.7 09/27/2019   HGB 12.5 09/27/2019   HCT 39.5 09/27/2019   PLT 306 09/27/2019   GLUCOSE 104 (H) 09/27/2019   CHOL 124 11/16/2017   TRIG 89 11/16/2017   HDL 49 11/16/2017   LDLCALC 57 11/16/2017   ALT 57 (H) 09/27/2019   AST 41 09/27/2019   NA 139 09/27/2019   K 4.2 09/27/2019   CL 104 09/27/2019   CREATININE 0.61 09/27/2019   BUN 13 09/27/2019   CO2 25 09/27/2019   TSH 2.560 11/22/2014   INR 1.06 10/01/2018       BNP (last 3 results) No results for input(s): BNP in the last 8760 hours.  ProBNP (last 3 results) No results for input(s): PROBNP in the last 8760 hours.   Other Studies Reviewed Today:  Carotid Doppler Summary 09/2019:  Right Carotid: Velocities in the right ICA are consistent with a 1-39%  stenosis.   Left Carotid: Velocities in the left ICA are consistent with a 1-39%  stenosis.   Vertebrals: Bilateral vertebral arteries demonstrate antegrade flow.  Subclavians: Normal flow hemodynamics were seen in  bilateral subclavian        arteries.   *See table(s) above for measurements and observations.      Electronically signed by Jenkins Rouge MD on 09/14/2019 at 12:23:44 PM.      Myoview Study Highlights 07/2017    Nuclear stress EF: 75%.  There was no ST segment deviation noted during stress.  Defect 1: There is a small defect of moderate  severity present in the apical anterior and apex location.  This is a low risk study.  The left ventricular ejection fraction is hyperdynamic (>65%).   Low risk stress nuclear study with mild soft tissue attenuation; no ischemia; EF 75 with normal wall motion.    ASSESSMENT & PLAN:    1. HTN - BP looks good on her current regimen - no changes made today.   2. HLD - on statin - labs from August noted.   3. Carotid disease - would plan to recheck in 2022. Needs aggressive risk factor modification.   4. Obesity - discussed at length.   5. Health maintenance - high dose flu shot was given today.   6. Borderline DM - needs to work on diet and weight - encouragement given today.   7. Chronic atypical chest pain - not really endorsed today. She feels like she is doing well.   8. Aortic atherosclerosis - needs aggressive CV risk factor modification.   Current medicines are reviewed with the patient today.  The patient does not have concerns regarding medicines other than what has been noted above.  The following changes have been made:  See above.  Labs/ tests ordered today include:    Orders Placed This Encounter  Procedures  . EKG 12-Lead     Disposition:   FU with Dr. Marlou Porch in one year.    Patient is agreeable to this plan and will call if any problems develop in the interim.   SignedTruitt Merle, NP  08/29/2020 11:15 AM  [place] [address] [place], [place]  [address] Phone: [phone] Fax: [phone]

## 2020-08-23 ENCOUNTER — Telehealth: Payer: Self-pay | Admitting: Family Medicine

## 2020-08-23 ENCOUNTER — Telehealth: Payer: Self-pay

## 2020-08-23 NOTE — Telephone Encounter (Signed)
Please let Ms. Kuriakose know that I have reviewed her most recent download report.  Sleep apnea is not well managed on current auto titrating pressure settings despite increased pressure.  I have ordered a CPAP titration if she is willing.  She should hear back from our sleep lab regarding an appointment for this.  I am concerned that elevated apneic events are contributing to her headaches.  She has a follow-up scheduled with Judson Roch for December that she may keep.

## 2020-08-23 NOTE — Telephone Encounter (Signed)
Pt was called to schedule CPAP study and was very upset that she already handled this in August.  Pt stated she had her machine increased.  Pt stated she's having leak and noise issues from her mask.  Pt was informed that the download shows poorly managed sleep apnea and that we could both fit a better mask and confirm the settings for her machine here in the lab where we can observe the impact on her sleep in real time.  Pt adamantly refuses to come in for a sleep study.

## 2020-08-23 NOTE — Addendum Note (Signed)
Addended by: Debbora Presto L on: 08/23/2020 08:22 AM   Modules accepted: Orders

## 2020-08-23 NOTE — Telephone Encounter (Signed)
Pt called back and scheduled a CPAP titration study.

## 2020-08-23 NOTE — Telephone Encounter (Signed)
Thank you for letting me know. We have also discussed need with patient. Glad she scheduled titration study.

## 2020-08-23 NOTE — Telephone Encounter (Signed)
I called pt and relayed the results of her cpap download.  cpap titration recommended. Order placed to [place] sleep lab.  She will call them as she has questions relating how long she will have to stay.  I could not answer that for her.  She was having noise issues with her machine.  I recommended she can call [facility] to ask about that.  ? Mask issue fitting/ strap?  This to be adressed when has cpap titration.  She verbalized understanding that even with pressure change increase still had issues with apneic spells and states she is sleepy during the day.

## 2020-08-29 ENCOUNTER — Ambulatory Visit (INDEPENDENT_AMBULATORY_CARE_PROVIDER_SITE_OTHER): Payer: Medicare Other | Admitting: Nurse Practitioner

## 2020-08-29 ENCOUNTER — Encounter: Payer: Self-pay | Admitting: Nurse Practitioner

## 2020-08-29 ENCOUNTER — Other Ambulatory Visit: Payer: Self-pay

## 2020-08-29 VITALS — BP 90/50 | HR 74 | Ht 65.0 in | Wt 213.4 lb

## 2020-08-29 DIAGNOSIS — E785 Hyperlipidemia, unspecified: Secondary | ICD-10-CM

## 2020-08-29 DIAGNOSIS — R0789 Other chest pain: Secondary | ICD-10-CM | POA: Diagnosis not present

## 2020-08-29 DIAGNOSIS — Z23 Encounter for immunization: Secondary | ICD-10-CM | POA: Diagnosis not present

## 2020-08-29 DIAGNOSIS — I10 Essential (primary) hypertension: Secondary | ICD-10-CM | POA: Diagnosis not present

## 2020-08-29 DIAGNOSIS — I779 Disorder of arteries and arterioles, unspecified: Secondary | ICD-10-CM

## 2020-08-29 NOTE — Patient Instructions (Addendum)
After Visit Summary:  We will be checking the following labs today - NONE   Medication Instructions:    Continue with your current medicines.    If you need a refill on your cardiac medications before your next appointment, please call your pharmacy.     Testing/Procedures To Be Arranged:  N/A  Follow-Up:   See Dr. Marlou Porch in one year -  You will receive a reminder letter in the mail two months in advance. If you don't receive a letter, please call our office to schedule the follow-up appointment.     At [facility], you and your health needs are our priority.  As part of our continuing mission to provide you with exceptional heart care, we have created designated Provider Care Teams.  These Care Teams include your primary Cardiologist (physician) and Advanced Practice Providers (APPs -  Physician Assistants and Nurse Practitioners) who all work together to provide you with the care you need, when you need it.  Special Instructions:  . Stay safe, wash your hands for at least 20 seconds and wear a mask when needed.  . It was good to talk with you today.  . Think about what we talked about today.    Call the [place] office at [phone] if you have any questions, problems or concerns.

## 2020-09-05 ENCOUNTER — Ambulatory Visit: Payer: Medicare Other | Admitting: Nurse Practitioner

## 2020-09-05 ENCOUNTER — Ambulatory Visit (INDEPENDENT_AMBULATORY_CARE_PROVIDER_SITE_OTHER): Payer: Medicare Other | Admitting: Neurology

## 2020-09-05 ENCOUNTER — Other Ambulatory Visit: Payer: Self-pay

## 2020-09-05 DIAGNOSIS — G4733 Obstructive sleep apnea (adult) (pediatric): Secondary | ICD-10-CM

## 2020-09-05 DIAGNOSIS — Z9989 Dependence on other enabling machines and devices: Secondary | ICD-10-CM

## 2020-09-05 DIAGNOSIS — R519 Headache, unspecified: Secondary | ICD-10-CM

## 2020-09-14 ENCOUNTER — Other Ambulatory Visit: Payer: Self-pay

## 2020-09-14 ENCOUNTER — Ambulatory Visit
Admission: RE | Admit: 2020-09-14 | Discharge: 2020-09-14 | Disposition: A | Discharge status: Home / Self Care | Payer: Medicare Other | Source: Ambulatory Visit | Attending: Internal Medicine | Admitting: Internal Medicine

## 2020-09-14 DIAGNOSIS — Z1231 Encounter for screening mammogram for malignant neoplasm of breast: Secondary | ICD-10-CM

## 2020-09-17 ENCOUNTER — Telehealth: Payer: Self-pay | Admitting: Neurology

## 2020-09-17 DIAGNOSIS — G4733 Obstructive sleep apnea (adult) (pediatric): Secondary | ICD-10-CM

## 2020-09-17 DIAGNOSIS — Z9889 Other specified postprocedural states: Secondary | ICD-10-CM

## 2020-09-17 DIAGNOSIS — Z9989 Dependence on other enabling machines and devices: Secondary | ICD-10-CM

## 2020-09-17 NOTE — Progress Notes (Signed)
PATIENT'S NAME:  Dior, Dominik DOB:      07-30-1946      MR#:    [id number]     DATE OF RECORDING: 09/05/2020 MR REFERRING M.D.:  Debbora Presto, NP for Marcial Pacas, MD Study Performed:   CPAP  Titration HISTORY:   Debbora Presto 07-02-2020: Compliance report dated 05/19/2020 through 06/17/2020 reveals that she used CPAP twenty-four of the past 30 days for compliance of 80%.  She used CPAP greater than 4 hours twenty-four of the past 30 days for compliance of 80%.  Average usage was 6 hours and 29 minutes.  Residual AHI was slightly elevated at 6.1 on 5 to 18 cm of water and an EPR of two. There was no significant leak noted.  The last sleep study was a HST on 10-12-2017, at the time Epworth of 15 points, AHI of 36.5/h. this was before sinus/ nasal surgery. She was prescribed auto CPAP at 5-15 cm water, 2 cm EPR and humidity.   The patient now endorsed the Epworth Sleepiness Scale at XXX points and the Fatigue Score at X points.   The patient's weight 209 pounds with a height of 65 (inches), resulting in a BMI of 34.9 kg/m2. The patient's neck circumference measured X inches.  CURRENT MEDICATIONS: Tylenol, Aspirin, Lipitor, Lotensin, Calcium, Celebrex, Lidex, Flonase, EMLA cream- Claritin, Multivitamins, Pamelor, Protonix, Inderal LA   PROCEDURE:  This is a multichannel digital polysomnogram utilizing the SomnoStar 11.2 system.  Electrodes and sensors were applied and monitored per AASM Specifications.   EEG, EOG, Chin and Limb EMG, were sampled at 200 Hz.  ECG, Snore and Nasal Pressure, Thermal Airflow, Respiratory Effort, CPAP Flow and Pressure, Oximetry was sampled at 50 Hz. Digital video and audio were recorded.      CPAP was initiated under a VITERA FFM in small size, at 5 cmH20 with heated humidity per AASM split night standards and pressure was advanced to 16 cmH20 because of hypopneas, apneas and desaturations.   At a PAP pressure of 16 cmH20, there was a reduction of the AHI to 1.2 with improvement of  sleep apnea.  Lights Out was at 21:47 and Lights On at 04:35. Total recording time (TRT) was 408 minutes, with a total sleep time (TST) of 306 minutes. The patient's sleep latency was 33 minutes. REM latency was 68 minutes.  The sleep efficiency was 75 %.    SLEEP ARCHITECTURE: WASO (Wake after sleep onset) was 38 minutes.  There were 13.5 minutes in Stage N1, 187.5 minutes Stage N2, 60 minutes Stage N3 and 45 minutes in Stage REM.  The percentage of Stage N1 was 4.4%, Stage N2 was 61.3%, Stage N3 was 19.6% and Stage R (REM sleep) was 14.7%.   RESPIRATORY ANALYSIS:  There was a total of 60 respiratory events: 51 obstructive apneas, 3 central apneas and 0 mixed apneas with a total of 54 apneas and an apnea index (AHI) of 10.6 /hour. There were 6 hypopneas with a hypopnea index of 1.2/hour.   The total APNEA/HYPOPNEA INDEX  (AHI) was 11.8 /hour . 49 events occurred in REM sleep and 11 events in NREM. The REM AHI was 65.3 /hour versus a non-REM AHI of 2.5 /hour.   The patient spent 152.5 minutes of total sleep time in the supine position and 154 minutes in non-supine.  The supine AHI was 21.7, versus a non-supine AHI of 2.0.  OXYGEN SATURATION & C02:  The baseline 02 saturation was 96%, with the lowest being 67%. Time  spent below 89% saturation equaled 27 minutes.  The arousals were noted as: 23 were spontaneous, 0 were associated with PLMs, 16 were associated with respiratory events. The patient had a total of 0 Periodic Limb Movements.   Audio and video analysis did not show any abnormal or unusual movements, behaviors, phonations or vocalizations. Snoring was noted. Increasing pressures were needed to control snoring, but AHI break throughs were noted during REM and in supine sleep.  EKG was in keeping with normal sinus rhythm.  DIAGNOSIS 1. Still moderate Obstructive Sleep Apnea present - the patient responded well to low CPAP pressures as long as she slept non supine and didn't enter REM sleep.   2. Sleep Related brief periods of Hypoxemia were present.  3. Upper Airway Resistance Syndrome   PLANS/RECOMMENDATIONS:  The best result with an AHI of 1.2/h was at 16 cm water CPAP. The patient was fitted with a small Vitera full face mask. I will either rest her current CPAP for new pressures, or order a new CPAP auto titration device with heated humidity for a setting between 5 and 16 cm water, 1 cm EPR.   DISCUSSION: A follow up appointment will be scheduled with NP in the Sleep Clinic at [facility].   Please call [phone] with any questions.      I certify that I have reviewed the entire raw data recording prior to the issuance of this report in accordance with the Standards of Accreditation of the American Academy of Sleep Medicine (AASM)   Larey Seat, M.D. Diplomat, Tax adviser of Psychiatry and Neurology  Diplomat, Tax adviser of Sleep Medicine Market researcher, [employer] [facility] at [employer]

## 2020-09-17 NOTE — Procedures (Signed)
PATIENT'S NAME:  Pamela Hendricks, Pamela Hendricks DOB:      [birth year]      MR#:    [id number]     DATE OF RECORDING: 09/05/2020 MR REFERRING M.D.:  Debbora Presto, NP for Marcial Pacas, MD Study Performed:   CPAP  Titration HISTORY:   Debbora Presto 07-02-2020: Compliance report dated 05/19/2020 through 06/17/2020 reveals that she used CPAP twenty-four of the past 30 days for compliance of 80%.  She used CPAP greater than 4 hours twenty-four of the past 30 days for compliance of 80%.  Average usage was 6 hours and 29 minutes.  Residual AHI was slightly elevated at 6.1 on 5 to 18 cm of water and an EPR of two. There was no significant leak noted.  The last sleep study was a HST on 10-12-2017, at the time Epworth of 15 points, AHI of 36.5/h. this was before sinus/ nasal surgery. She was prescribed auto CPAP at 5-15 cm water, 2 cm EPR and humidity.  No baseline study since.   The patient now endorsed the Epworth Sleepiness Scale at XXX points and the Fatigue Score at X points.   The patient's weight 209 pounds with a height of 65 (inches), resulting in a BMI of 34.9 kg/m2. The patient's neck circumference measured X inches.  CURRENT MEDICATIONS: Tylenol, Aspirin, Lipitor, Lotensin, Calcium, Celebrex, Lidex, Flonase, EMLA cream- Claritin, Multivitamins, Pamelor, Protonix, Inderal LA   PROCEDURE:  This is a multichannel digital polysomnogram utilizing the SomnoStar 11.2 system.  Electrodes and sensors were applied and monitored per AASM Specifications.   EEG, EOG, Chin and Limb EMG, were sampled at 200 Hz.  ECG, Snore and Nasal Pressure, Thermal Airflow, Respiratory Effort, CPAP Flow and Pressure, Oximetry was sampled at 50 Hz. Digital video and audio were recorded.      CPAP was initiated under a VITERA FFM in small size, at 5 cmH20 with heated humidity per AASM split night standards and pressure was advanced to 16 cmH20 because of hypopneas, apneas and desaturations.   At a PAP pressure of 16 cmH20, there was a reduction of the AHI  to 1.2 with improvement of sleep apnea.  Lights Out was at 21:47 and Lights On at 04:35. Total recording time (TRT) was 408 minutes, with a total sleep time (TST) of 306 minutes. The patient's sleep latency was 33 minutes. REM latency was 68 minutes.  The sleep efficiency was 75 %.    SLEEP ARCHITECTURE: WASO (Wake after sleep onset) was 38 minutes.  There were 13.5 minutes in Stage N1, 187.5 minutes Stage N2, 60 minutes Stage N3 and 45 minutes in Stage REM.  The percentage of Stage N1 was 4.4%, Stage N2 was 61.3%, Stage N3 was 19.6% and Stage R (REM sleep) was 14.7%.   RESPIRATORY ANALYSIS:  There was a total of 60 respiratory events: 51 obstructive apneas, 3 central apneas and 0 mixed apneas with a total of 54 apneas and an apnea index (AHI) of 10.6 /hour. There were 6 hypopneas with a hypopnea index of 1.2/hour.   The total APNEA/HYPOPNEA INDEX  (AHI) was 11.8 /hour . 49 events occurred in REM sleep and 11 events in NREM. The REM AHI was 65.3 /hour versus a non-REM AHI of 2.5 /hour.   The patient spent 152.5 minutes of total sleep time in the supine position and 154 minutes in non-supine.  The supine AHI was 21.7, versus a non-supine AHI of 2.0.  OXYGEN SATURATION & C02:  The baseline 02 saturation was 96%, with  the lowest being 67%. Time spent below 89% saturation equaled 27 minutes.  The arousals were noted as: 23 were spontaneous, 0 were associated with PLMs, 16 were associated with respiratory events. The patient had a total of 0 Periodic Limb Movements.   Audio and video analysis did not show any abnormal or unusual movements, behaviors, phonations or vocalizations. Snoring was noted. Increasing pressures were needed to control snoring, but AHI break throughs were noted during REM and in supine sleep.  EKG was in keeping with normal sinus rhythm.  DIAGNOSIS 1. Still moderate Obstructive Sleep Apnea present - the patient responded well to low CPAP pressures as long as she slept non supine  and didn't enter REM sleep.  2. Sleep Related brief periods of Hypoxemia were present.  3. Upper Airway Resistance Syndrome   PLANS/RECOMMENDATIONS:  The best result with an AHI of 1.2/h was at 16 cm water CPAP. The patient was fitted with a small Vitera full face mask. I will either rest her current CPAP for new pressures, or order a new CPAP auto titration device with heated humidity for a setting between 5 and 16 cm water, 1 cm EPR.   DISCUSSION: A follow up appointment will be scheduled with NP in the Sleep Clinic at [facility].   Please call [phone] with any questions.      I certify that I have reviewed the entire raw data recording prior to the issuance of this report in accordance with the Standards of Accreditation of the American Academy of Sleep Medicine (AASM)   Larey Seat, M.D. Diplomat, Tax adviser of Psychiatry and Neurology  Diplomat, Tax adviser of Sleep Medicine Market researcher, [employer] [facility] [employer]

## 2020-09-17 NOTE — Telephone Encounter (Signed)
DIAGNOSIS 1. Still moderate Obstructive Sleep Apnea present - the patient responded well to low CPAP pressures as long as she slept non supine and didn't enter REM sleep.  2. Sleep Related brief periods of Hypoxemia were present.  3. Upper Airway Resistance Syndrome   PLANS/RECOMMENDATIONS:  The best result with an AHI of 1.2/h was at 16 cm water CPAP. The patient was fitted with a small Vitera full face mask. I will either rest her current CPAP for new pressures, or order a new CPAP auto titration device with heated humidity for a setting between 5 and 16 cm water, 1 cm EPR.   DISCUSSION: A follow up appointment will be scheduled with NP in the Sleep Clinic at [facility].   Please call [phone] with any questions.

## 2020-09-18 NOTE — Telephone Encounter (Signed)
Dr Dohmeier will keep the patient's current setting where it is at for now. She highly encourages that the patient avoid sleeping on her back which I have already educated her on. CPAP will stay 6-18 cm water pressure with 2 cm EPR. We will follow up in Dec and discuss in more detail.

## 2020-09-18 NOTE — Telephone Encounter (Signed)
Called the patient and reviewed the sleep study. Advised the patient that the best pressure was treated at 16 cm water pressure. Pt current setting on auto CPAP is 6-18 cm water pressure. Dr Dohmeier suggested reducing the pressure to 5-16 cm water pressure.  Advised the patient when reviewing her download she is still having AHI 7.8. she had elevated AHI when on her back compared to minimal when on her side. Advised that we would encourage sleeping on her side. Patient states that she has to sleep with her head elevated and on her back. She starts out that way. She advised she would try her best. I have scheduled her for the follow up visit 12/7 at 9:30 am to be able to discuss concerns and she is also interested in learning about inspire.

## 2020-09-18 NOTE — Addendum Note (Signed)
Addended by: Darleen Crocker on: 09/18/2020 04:27 PM   Modules accepted: Orders

## 2020-10-16 ENCOUNTER — Encounter: Payer: Self-pay | Admitting: Neurology

## 2020-10-16 ENCOUNTER — Other Ambulatory Visit: Payer: Self-pay

## 2020-10-16 ENCOUNTER — Ambulatory Visit (INDEPENDENT_AMBULATORY_CARE_PROVIDER_SITE_OTHER): Payer: Medicare Other | Admitting: Neurology

## 2020-10-16 VITALS — BP 132/60 | HR 65 | Ht 65.0 in | Wt 217.0 lb

## 2020-10-16 DIAGNOSIS — Z9889 Other specified postprocedural states: Secondary | ICD-10-CM

## 2020-10-16 DIAGNOSIS — Z9989 Dependence on other enabling machines and devices: Secondary | ICD-10-CM

## 2020-10-16 DIAGNOSIS — G4733 Obstructive sleep apnea (adult) (pediatric): Secondary | ICD-10-CM

## 2020-10-16 NOTE — Progress Notes (Signed)
SLEEP MEDICINE CLINIC    Provider:  Larey Seat, MD  Primary Care Physician:  Leeroy Cha, MD [address] [place] [address]     Referring Provider: Dr Krista Blue.          Chief Complaint according to patient   Patient presents with:    . New Patient (Initial Visit)     pt states that she had surgery and she had stopped using the machine. she has been struggling with a lot of sinus drainage concerns.      HISTORY OF PRESENT ILLNESS:  Pamela Hendricks is a 74 y.o. year old  Caucasian female patient seen here in a RV  on 10/16/2020 , she states she is a supine sleeper- and that is when her apnea increased. The REM AHI was 65/ h and overall AHI was only mild apnea range - 11.8/h.  Supine AHI was 21.7/h. Nadir SpO2 was 67% and total was 27 minutes. Titration was to 16 cm water on [place]a FFM, small and stated this is comfortable. She status post sinus surgery last November 2020. She experienced CPAP user, she now has an autotitration CPAP-  The patient had some questions about the inspire procedure, inspire would not correct her oxygen desaturation and that usually not treating REM sleep apnea very well and overall her apnea is mild so I think she should remain with CPAP treatment.  There is also is the exclusion criteria of the BMI which is currently 36.11 and would have to be 32 or less.  Her compliance on CPAP is excellent 93% average 6 hours 32 minutes at night the minimum pressure has been set for 6 of the maximum pressure for 18 cmH2O with a 2 cm EPR the 95th percentile pressure at this time is 13.3 cmH2O and her AHI by was 4.6.  So at this time I would not want her to change any further I think there are some air leaks still will happen that is okay there are no central apneas emerging.  How likely are you to doze in the following situations: 0 = not likely, 1 = slight chance, 2 = moderate chance, 3 = high chance  Sitting and Reading? Watching  Television? Sitting inactive in a public place (theater or meeting)? Lying down in the afternoon when circumstances permit? Sitting and talking to someone? Sitting quietly after lunch without alcohol? In a car, while stopped for a few minutes in traffic? As a passenger in a car for an hour without a break?  Total = 11 points.          Pre CPAP visit: Pamela Hendricks underwent sinus surgery on October 01, 2019 under the guidance of Dr. Wilburn Cornelia ENT [facility].  Since then she has not been able to use her CPAP but her sleep has not been good.  However she is doing quite well in terms of the postsurgical healing. She still has mucous drainage, thick mucous, and uses saline nasal spray 6 times a day. sinus- rinse at night. She had a blockage of the frontal sinus.  Her OSA is currently not treated. Still has some headaches, taking tylenol. She would be theoretically ready to use CPAP again, but the drainage is bothering her and CPAP feels as if it pushes the mucous back in.    Originally referred  from Dr Krista Blue . Previously followed by Cecille Rubin, NP  Chief concern according to patient :  I have never met ou  but I have a CPAP that I don't like much. I thought my sleep would be so much better ,but its only a little bit better.  She had a known medical history of Arthritis, Carotid artery disease ([place]), Chronic frontal sinusitis, Elevated liver enzymes, Environmental and seasonal allergies, GERD (gastroesophageal reflux disease), Hyperlipidemia, Hypertension, Migraines, OSA (obstructive sleep apnea), Peripheral neuropathy, Peripheral vascular disease ([place]), Pre-diabetes, Snores, and Wears glasses.          The patient had the first sleep study  01-28-2015 ,  Resulting in an AHI 14.5/ and RDI 28/h. referred by Dr Krista Blue, titrated to 8 cm water.  she has been using the CPAP since 2018 when she had another SPLIT night study .  She now is tired of CPAP , and may be interested in [place].   I had the  pleasure of seeing Pamela Hendricks  who brought me a download from her CPAP machine.  She is a 77% compliant CPAP user but the average user time is just falling short of 4 hours at 3 hours and 52 minutes.  She is using an AutoSet with a minimum pressure of 5 and a maximum pressure of 16 cmH2O I did expiratory pressure relief of 3 cm.  Her residual apneas are nearly all obstructive in origin and her AHI is 5.8/h. Marland Kitchen  The 95th percentile pressure is 13.5.  I think we need to increase the overall pressure and decrease the expiratory pressure relief the air leaks are excellent and do not contribute to the residual apneas.  The patient is still a little bit sleepy in daytime but she does not endorse a high degree of fatigue.  I hope that with an increase in pressure we can promote longer more restorative sleep for her.   Social history:  Patient is retired from English as a second language teacher and lives in a household with alone. Family status is widowed  with one living son, her daughter passed.  No pets are present. Tobacco use: none .  ETOH use ; none , Caffeine intake in form of Coffee( 1) Soda( no) Tea ( 1) or energy drinks. Regular exercise: Walking.  Sleep habits are as follows: The patient's dinner time is between 5 PM.  The patient goes to bed at 1 AM and continues to sleep for 2 hours, wakes for bathroom breaks,.The preferred sleep position is supine , with the support of 3 pillows.  Dreams are reportedly rare.  7.30 AM is the usual rise time. The patient wakes up spontaneously 7.30/ She reports not feeling refreshed or restored in AM, with symptoms such as dry mouth , morning headaches, and residual fatigue. Post nasal drip,   Naps are taken infrequently, lasting from 90 minutes and are more refreshing than nocturnal sleep.    Review of Systems:  Out of a complete 14 system review, the patient complains of only the following symptoms, and all other reviewed systems are negative.:  Fatigue, sleepiness , snoring,  fragmented sleep, Insomnia sometimes. Postnasal drip.   Obesity    How likely are you to doze in the following situations: 0 = not likely, 1 = slight chance, 2 = moderate chance, 3 = high chance   Sitting and Reading?  2 Watching Television? 2 Sitting inactive in a public place (theater or meeting)? As a passenger in a car for an hour without a break?3 Lying down in the afternoon when circumstances permit?3 Sitting and talking to someone? Sitting quietly after lunch without alcohol? In a car, while  stopped for a few minutes in traffic?   Total = 11/ 24 points   FSS endorsed at 23/ 63 points.   Snoring when not using CPAP, tossing and turning without CPAP. Has not used CPAP since Nov 2020 due to sinus surgery until August 2021..  .   Social History   Socioeconomic History  . Marital status: Widowed    Spouse name: Laverna Peace  . Number of children: 2  . Years of education: 62  . Highest education level: Not on file  Occupational History  . Occupation: retired    Comment: works partime with vendors  Tobacco Use  . Smoking status: Never Smoker  . Smokeless tobacco: Never Used  Vaping Use  . Vaping Use: Never used  Substance and Sexual Activity  . Alcohol use: No  . Drug use: No  . Sexual activity: Not on file  Other Topics Concern  . Not on file  Social History Narrative   Patient is widowed. Patient is retired . 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 Patient has a 11th grade education. She has two children.   Social Determinants of Health   Financial Resource Strain:   . Difficulty of Paying Living Expenses: Not on file  Food Insecurity:   . Worried About Charity fundraiser in the Last Year: Not on file  . Ran Out of Food in the Last Year: Not on file  Transportation Needs:   . Lack of Transportation (Medical): Not on file  . Lack of Transportation (Non-Medical): Not on file  Physical Activity:   . Days of Exercise per Week: Not on file  . Minutes of  Exercise per Session: Not on file  Stress:   . Feeling of Stress : Not on file  Social Connections:   . Frequency of Communication with Friends and Family: Not on file  . Frequency of Social Gatherings with Friends and Family: Not on file  . Attends Religious Services: Not on file  . Active Member of Clubs or Organizations: Not on file  . Attends Archivist Meetings: Not on file  . Marital Status: Not on file    Family History  Problem Relation Age of Onset  . Cancer Mother   . Hypertension Mother   . Hypertension Father   . Heart disease Father        MI around 48, died at 59  . Cancer Brother        Prostate Ca  . Cancer Sister        Brain Ca  . CAD Daughter        Had bypass in her 54s (type 1 diabetic), died at 68    Past Medical History:  Diagnosis Date  . Arthritis   . Carotid artery disease ([place])    a. duplex 07/2017 - 1-39% stenosis bilaterally.  . Chronic frontal sinusitis   . Elevated liver enzymes    per pt  . Environmental and seasonal allergies   . GERD (gastroesophageal reflux disease)   . Hyperlipidemia   . Hypertension   . Migraines    sinus headaches, no longer having migraines  . OSA (obstructive sleep apnea)    CPAP  . Peripheral neuropathy   . Peripheral vascular disease ([place])    carotid blockage - is followed by Dr. Marlou Porch  . Pre-diabetes   . Snores   . Wears glasses     Past Surgical History:  Procedure Laterality Date  . ABDOMINAL HYSTERECTOMY  1970   Total HYST  . BREAST BIOPSY Bilateral 2001   benign  . BREAST EXCISIONAL BIOPSY Right 2013   sclerosing lesion  . BREAST LUMPECTOMY WITH RADIOACTIVE SEED LOCALIZATION Left 08/31/2018   Procedure: BREAST LUMPECTOMY WITH RADIOACTIVE SEED LOCALIZATION;  Surgeon: Fanny Skates, MD;  Location: [facility];  Service: General;  Laterality: Left;  . COLONOSCOPY    . JOINT REPLACEMENT Left 2001   Left Knee Replacement  . KNEE ARTHROSCOPY Left    lt  . NASAL SINUS SURGERY    . NASAL  SINUS SURGERY Bilateral 09/30/2019   Procedure: ENDOSCOPIC SINUS SURGERY;  Surgeon: Jerrell Belfast, MD;  Location: [facility];  Service: ENT;  Laterality: Bilateral;  . TOE SURGERY     left foot digit # 2  . TOTAL KNEE ARTHROPLASTY Right 10/12/2018   Procedure: TOTAL KNEE ARTHROPLASTY;  Surgeon: Melrose Nakayama, MD;  Location: [facility];  Service: Orthopedics;  Laterality: Right;  . WISDOM TOOTH EXTRACTION       Current Outpatient Medications on File Prior to Visit  Medication Sig Dispense Refill  . acetaminophen (TYLENOL) 500 MG tablet Take 1,000 mg by mouth every 6 (six) hours as needed for mild pain or headache.    Marland Kitchen aspirin EC 81 MG tablet Take 81 mg by mouth daily.    Marland Kitchen atorvastatin (LIPITOR) 10 MG tablet Take 10 mg by mouth at bedtime.     . benazepril (LOTENSIN) 40 MG tablet Take 40 mg by mouth daily.     . Calcium Carb-Cholecalciferol (CALCIUM 600+D3 PO) Take 1 tablet by mouth 2 (two) times daily.    . celecoxib (CELEBREX) 200 MG capsule Take 200 mg by mouth every other day.     . fluocinonide cream (LIDEX) 0.05 % APP EXTERNALLY UNDER THE BREAST BID    . fluticasone (FLONASE) 50 MCG/ACT nasal spray Place into both nostrils daily as needed for allergies or rhinitis.    Marland Kitchen lidocaine-prilocaine (EMLA) cream Apply 1 application topically 3 (three) times daily as needed. 30 g 11  . loratadine (CLARITIN) 10 MG tablet Take by mouth.    . Multiple Vitamins-Minerals (MULTIVITAMIN WITH MINERALS) tablet Take 1 tablet by mouth daily.    . nortriptyline (PAMELOR) 25 MG capsule Take 2 capsules (50 mg total) by mouth at bedtime. 60 capsule 11  . pantoprazole (PROTONIX) 40 MG tablet Take 40 mg by mouth daily.     . propranolol ER (INDERAL LA) 60 MG 24 hr capsule Take 1 capsule (60 mg total) by mouth at bedtime. Please call [phone] to schedule appt. 30 capsule 2   No current facility-administered medications on file prior to visit.    Allergies  Allergen Reactions  . Other Other (See Comments)     Watery eyes , itching - Grass, Rag Weed, Mold, Smoke    Physical exam:  Today's Vitals   10/16/20 0903  BP: 132/60  Pulse: 65  Weight: 217 lb (98.4 kg)  Height: '5\' 5"'  (1.651 m)   Body mass index is 36.11 kg/m.   Wt Readings from Last 3 Encounters:  10/16/20 217 lb (98.4 kg)  08/29/20 213 lb 6.4 oz (96.8 kg)  06/19/20 209 lb (94.8 kg)     Ht Readings from Last 3 Encounters:  10/16/20 '5\' 5"'  (1.651 m)  08/29/20 '5\' 5"'  (1.651 m)  06/19/20 '5\' 5"'  (1.651 m)      General: The patient is awake, alert and appears not in acute distress. The patient is well groomed. Head: Normocephalic, atraumatic. Neck  is supple. Mallampati 3,  neck circumference: 15 inches . Nasal airflow opened  - left nasion is restricted.  status post nasal surgery. Twice had sinus surgery .   Retrognathia is seen.  Crowded lower jaw, small mouth opening.    BMI - 35. Cardiovascular:  Regular rate and cardiac rhythm by pulse. Respiratory: Lungs are clear to auscultation.  Skin:  Without evidence of ankle edema, or rash. Well healed scar over the right knee.  Trunk: The patient's posture is erect.   Neurologic exam : The patient is awake and alert, oriented to place and time.   Memory subjective described as intact.  Attention span & concentration ability appears normal.  Speech is fluent,  with dysphonia .  Mood and affect are appropriate.   Cranial nerves: no loss of smell or taste reported,  not vaccinated.   Pupils are equal and briskly reactive to light. Funduscopic exam deferred.   Extraocular movements in vertical and horizontal planes were intact  Visual fields by finger perimetry are intact.Hearing was intact to soft voice and finger rubbing.   Facial sensation intact to fine touch.  Facial motor strength is symmetric and tongue and uvula move midline.  Neck ROM : rotation, tilt and flexion extension were normal for age and shoulder shrug was symmetrical.  Motor exam:  Symmetric bulk, tone and  ROM.   Normal tone without cog wheeling, symmetric grip strength . Sensory:  deferred. Coordination: deferred. Gait and station: Patient could rise assisted from a seated position, she braces herself.  Deep tendon reflexes: in the upper and lower extremities are symmetric / intact.  Babinski response was deferred .     After spending a total time of 20 minutes face to face and additional time for physical and neurologic examination, review of laboratory studies,  personal review of imaging studies, reports and results of other testing and review of referral information / records as far as provided in visit, I have established the following assessments:  1) incomplete resolution of mild apnea, OSA, on current auto titration CPAP.the machine is only 75 years old.  I wish her well with the ENT evaluation.    My Plan is to proceed with:  1) auto CPAP  - current settings.     2) follow up after 12 month .  Please encourage the vaccine!.   I would like to thank Dr. Carollee Sires, [place], [place] [address] [place],  [place] [address] for allowing me to meet with and to take care of this pleasant patient.   In short, Pamela Hendricks is presenting with incomplete resolution of OSA ,.  CC: I will share my notes with PCP .  Electronically signed by: Larey Seat, MD 10/16/2020 9:10 AM  [facility] Board certified by The [employer] of Sleep Medicine and Diplomate of the [employer] of Sleep Medicine. Board certified In Neurology through the [place], Fellow of the [employer] of Neurology. Medical Director of [facility].

## 2020-10-23 ENCOUNTER — Ambulatory Visit: Payer: Medicare Other | Admitting: Neurology

## 2020-12-11 ENCOUNTER — Telehealth: Payer: Self-pay | Admitting: Neurology

## 2020-12-11 MED ORDER — NORTRIPTYLINE HCL 25 MG PO CAPS: 50.0000 mg | ORAL_CAPSULE | Freq: Every day | ORAL | 0 refills | Status: DC

## 2020-12-11 NOTE — Telephone Encounter (Signed)
This medication is prescribed by Dr Krista Blue. I have sent the refill in for the patient for a 3 month supply. Pt saw Judson Roch last in June and would just need to make sure that she follows up with Krista Blue or Judson Roch for migraine management.

## 2020-12-11 NOTE — Telephone Encounter (Signed)
Pt. is requesting a new prescription for nortriptyline (PAMELOR) 25 MG capsule for a 90 day supply as she goes through the 60 day supply to quickly.

## 2020-12-11 NOTE — Addendum Note (Signed)
Addended by: Darleen Crocker on: 12/11/2020 10:37 AM   Modules accepted: Orders

## 2020-12-19 DIAGNOSIS — H25813 Combined forms of age-related cataract, bilateral: Secondary | ICD-10-CM | POA: Diagnosis not present

## 2021-01-03 DIAGNOSIS — J329 Chronic sinusitis, unspecified: Secondary | ICD-10-CM | POA: Diagnosis not present

## 2021-01-03 DIAGNOSIS — G43009 Migraine without aura, not intractable, without status migrainosus: Secondary | ICD-10-CM | POA: Diagnosis not present

## 2021-01-03 DIAGNOSIS — Z96651 Presence of right artificial knee joint: Secondary | ICD-10-CM | POA: Diagnosis not present

## 2021-01-03 DIAGNOSIS — R7303 Prediabetes: Secondary | ICD-10-CM | POA: Diagnosis not present

## 2021-01-03 DIAGNOSIS — I10 Essential (primary) hypertension: Secondary | ICD-10-CM | POA: Diagnosis not present

## 2021-01-03 DIAGNOSIS — E785 Hyperlipidemia, unspecified: Secondary | ICD-10-CM | POA: Diagnosis not present

## 2021-01-16 DIAGNOSIS — Z79899 Other long term (current) drug therapy: Secondary | ICD-10-CM | POA: Diagnosis not present

## 2021-01-16 DIAGNOSIS — H2511 Age-related nuclear cataract, right eye: Secondary | ICD-10-CM | POA: Diagnosis not present

## 2021-01-16 DIAGNOSIS — Z01818 Encounter for other preprocedural examination: Secondary | ICD-10-CM | POA: Diagnosis not present

## 2021-01-16 DIAGNOSIS — H2512 Age-related nuclear cataract, left eye: Secondary | ICD-10-CM | POA: Diagnosis not present

## 2021-01-16 DIAGNOSIS — M150 Primary generalized (osteo)arthritis: Secondary | ICD-10-CM | POA: Diagnosis not present

## 2021-01-25 DIAGNOSIS — H2511 Age-related nuclear cataract, right eye: Secondary | ICD-10-CM | POA: Diagnosis not present

## 2021-01-31 DIAGNOSIS — H2511 Age-related nuclear cataract, right eye: Secondary | ICD-10-CM | POA: Diagnosis not present

## 2021-02-04 DIAGNOSIS — H2512 Age-related nuclear cataract, left eye: Secondary | ICD-10-CM | POA: Diagnosis not present

## 2021-02-12 DIAGNOSIS — H2512 Age-related nuclear cataract, left eye: Secondary | ICD-10-CM | POA: Diagnosis not present

## 2021-02-14 DIAGNOSIS — L439 Lichen planus, unspecified: Secondary | ICD-10-CM | POA: Diagnosis not present

## 2021-03-08 ENCOUNTER — Other Ambulatory Visit: Payer: Self-pay | Admitting: Neurology

## 2021-03-08 ENCOUNTER — Telehealth: Payer: Self-pay | Admitting: Neurology

## 2021-03-08 NOTE — Telephone Encounter (Signed)
Pt is needing a refill on her nortriptyline (PAMELOR) 25 MG capsule sent in to the [facility] on [address]  Pt is also needing to speak to the RN regarding the quantity of this medication. Please advise.

## 2021-03-11 MED ORDER — NORTRIPTYLINE HCL 25 MG PO CAPS: ORAL_CAPSULE | ORAL | 0 refills | Status: DC

## 2021-03-11 NOTE — Telephone Encounter (Signed)
Called the pt back. There was no answer. LVM asking for the call back.  Pt needs a yearly medication management visit either with Sarah or Dr Krista Blue. (Dohmeier only sees pt for sleep) medication is prescribed by Dr Krista Blue.   ** If pt returns call, please scheduled yearly medication management with Dr Krista Blue or Judson Roch, NP. Please ask what is needed in reference to refilling her medication? (it is prescribed 2 capsules at bedtime) I will send a 3 month supply with 0 refills but patient must make a follow up apt for further refills.

## 2021-03-26 IMAGING — MG DIGITAL SCREENING BILAT W/ TOMO W/ CAD
8 series · 8 of 24 positions shown · non-contrast
Comparison: Previous exam(s).

CLINICAL DATA: Screening.

EXAM:
DIGITAL SCREENING BILATERAL MAMMOGRAM WITH TOMO AND CAD

[R MLO synth-2D]
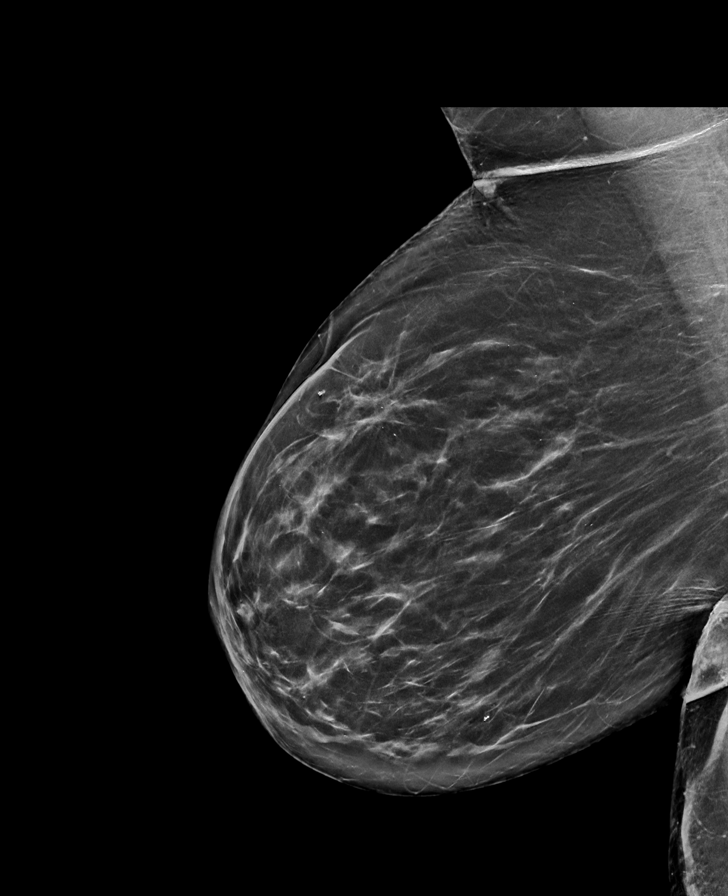

[R CC synth-2D]
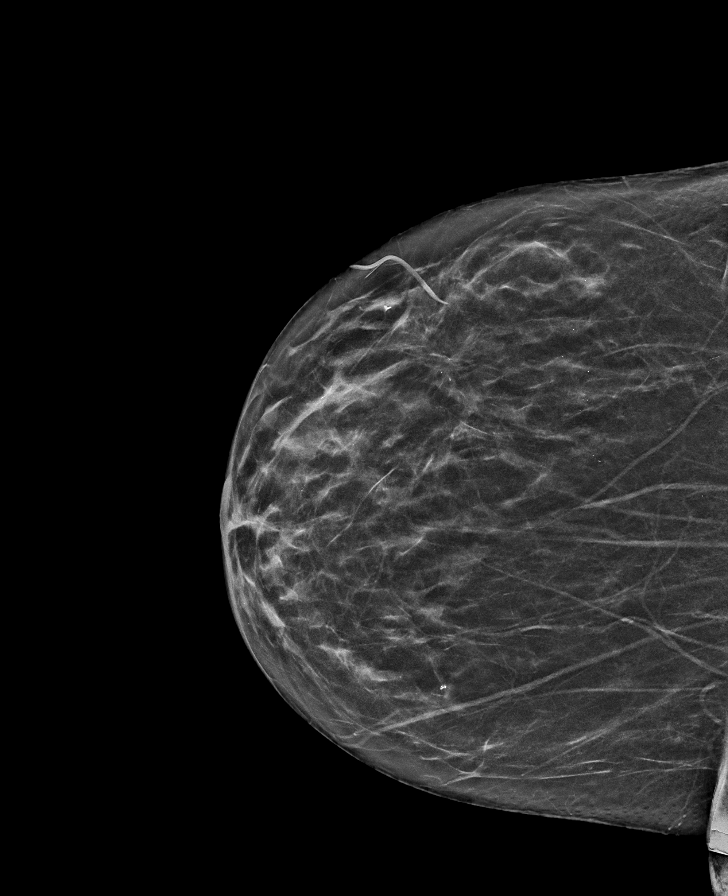

[L CC synth-2D]
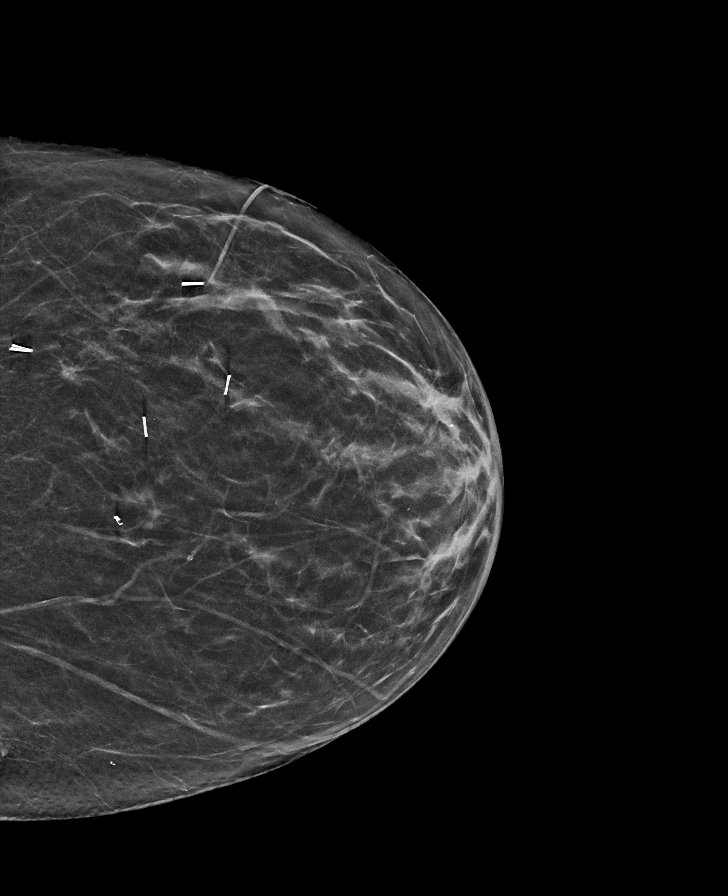

[L MLO synth-2D]
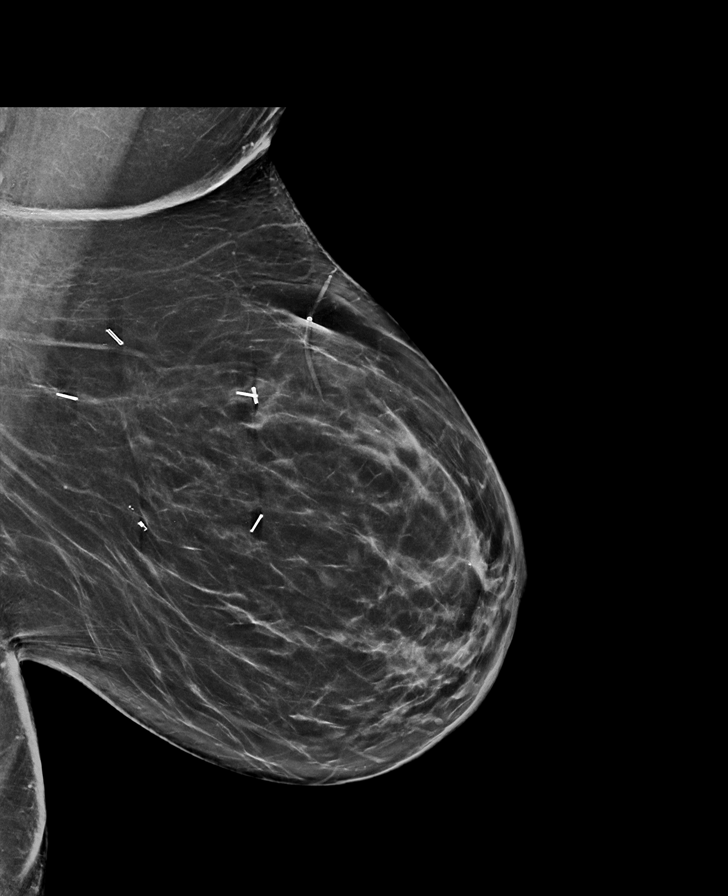

[R CC tomo · tomo slice 29/56.0]
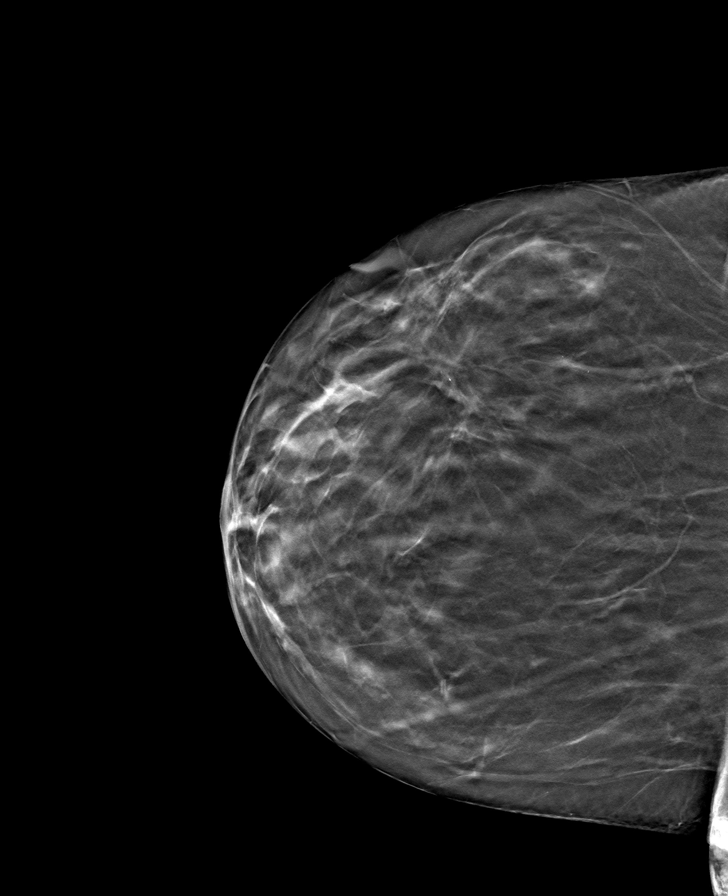

[L CC tomo · tomo slice 27/53.0]
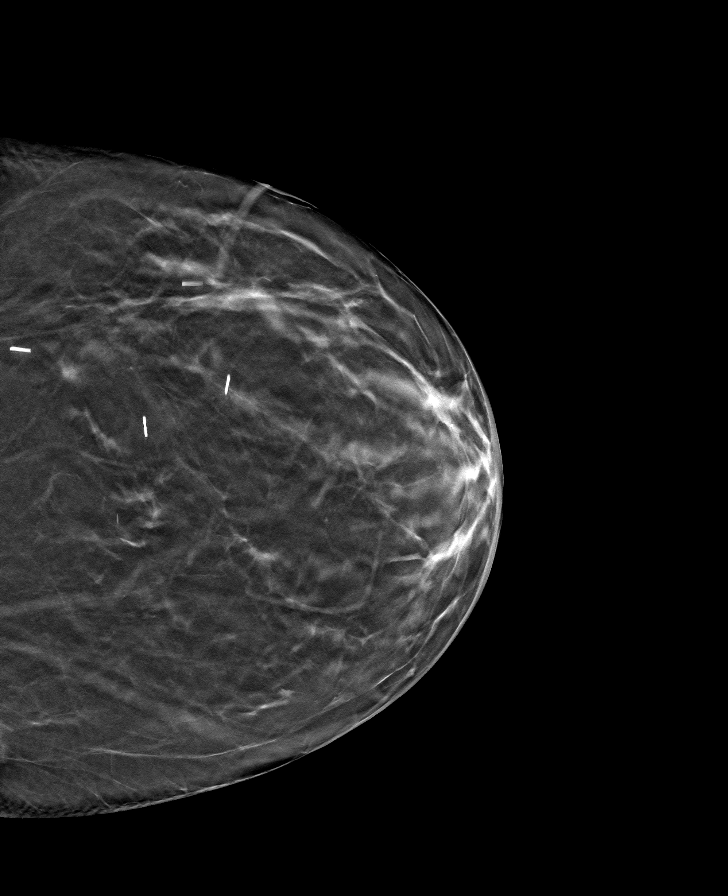

[L MLO tomo · tomo slice 37/73.0]
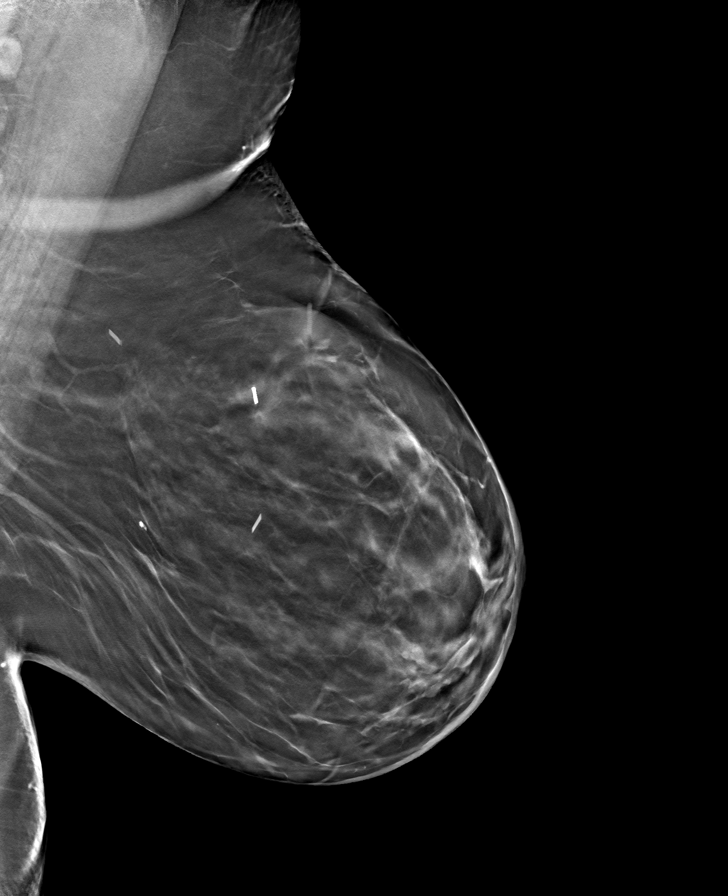

[R MLO tomo · tomo slice 35/70.0]
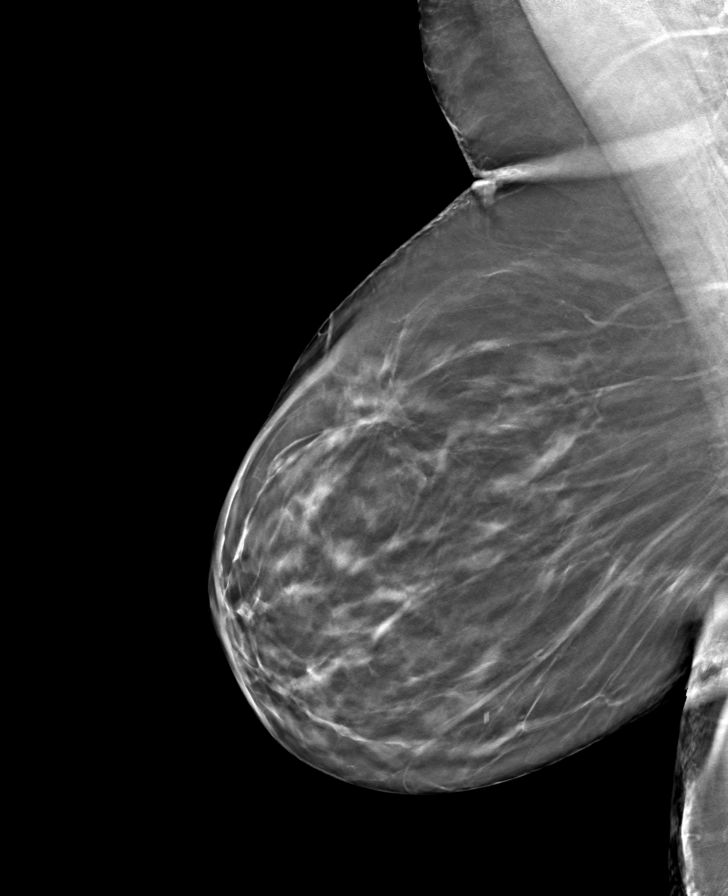

[8 of 24 positions shown; findings below may reference images not displayed]

ACR Breast Density Category b: There are scattered areas of
fibroglandular density.
FINDINGS: There are no findings suspicious for malignancy. Images were
processed with CAD.
IMPRESSION: No mammographic evidence of malignancy. A result letter of this
screening mammogram will be mailed directly to the patient.

RECOMMENDATION:
Screening mammogram in one year. (Code:[id number])

BI-RADS CATEGORY  1: Negative.

## 2021-06-06 DIAGNOSIS — H16223 Keratoconjunctivitis sicca, not specified as Sjogren's, bilateral: Secondary | ICD-10-CM | POA: Diagnosis not present

## 2021-06-10 ENCOUNTER — Telehealth: Payer: Self-pay | Admitting: Neurology

## 2021-06-10 MED ORDER — NORTRIPTYLINE HCL 25 MG PO CAPS: 50.0000 mg | ORAL_CAPSULE | Freq: Every day | ORAL | 0 refills | Status: DC

## 2021-06-10 NOTE — Telephone Encounter (Signed)
I spoke to the patient. She scheduled her follow up for her migraines on 07/24/21. Nortriptyline refills have been send to the pharmacy.

## 2021-06-10 NOTE — Telephone Encounter (Signed)
nortriptyline (PAMELOR) 25 MG capsule (90 day) to [facility] [phone]

## 2021-07-05 DIAGNOSIS — Z13220 Encounter for screening for lipoid disorders: Secondary | ICD-10-CM | POA: Diagnosis not present

## 2021-07-05 DIAGNOSIS — E785 Hyperlipidemia, unspecified: Secondary | ICD-10-CM | POA: Diagnosis not present

## 2021-07-05 DIAGNOSIS — I779 Disorder of arteries and arterioles, unspecified: Secondary | ICD-10-CM | POA: Diagnosis not present

## 2021-07-05 DIAGNOSIS — I10 Essential (primary) hypertension: Secondary | ICD-10-CM | POA: Diagnosis not present

## 2021-07-05 DIAGNOSIS — I700 Atherosclerosis of aorta: Secondary | ICD-10-CM | POA: Diagnosis not present

## 2021-07-05 DIAGNOSIS — Z1389 Encounter for screening for other disorder: Secondary | ICD-10-CM | POA: Diagnosis not present

## 2021-07-05 DIAGNOSIS — I8393 Asymptomatic varicose veins of bilateral lower extremities: Secondary | ICD-10-CM | POA: Diagnosis not present

## 2021-07-05 DIAGNOSIS — R300 Dysuria: Secondary | ICD-10-CM | POA: Diagnosis not present

## 2021-07-05 DIAGNOSIS — Z0000 Encounter for general adult medical examination without abnormal findings: Secondary | ICD-10-CM | POA: Diagnosis not present

## 2021-07-18 DIAGNOSIS — M150 Primary generalized (osteo)arthritis: Secondary | ICD-10-CM | POA: Diagnosis not present

## 2021-07-18 DIAGNOSIS — Z79899 Other long term (current) drug therapy: Secondary | ICD-10-CM | POA: Diagnosis not present

## 2021-07-24 ENCOUNTER — Ambulatory Visit (INDEPENDENT_AMBULATORY_CARE_PROVIDER_SITE_OTHER): Payer: Medicare Other | Admitting: Neurology

## 2021-07-24 ENCOUNTER — Encounter: Payer: Self-pay | Admitting: Neurology

## 2021-07-24 ENCOUNTER — Other Ambulatory Visit: Payer: Self-pay

## 2021-07-24 VITALS — BP 133/79 | HR 68 | Ht 65.0 in | Wt 212.0 lb

## 2021-07-24 DIAGNOSIS — R519 Headache, unspecified: Secondary | ICD-10-CM | POA: Diagnosis not present

## 2021-07-24 DIAGNOSIS — Z9989 Dependence on other enabling machines and devices: Secondary | ICD-10-CM

## 2021-07-24 DIAGNOSIS — G4733 Obstructive sleep apnea (adult) (pediatric): Secondary | ICD-10-CM

## 2021-07-24 MED ORDER — NORTRIPTYLINE HCL 25 MG PO CAPS: 50.0000 mg | ORAL_CAPSULE | Freq: Every day | ORAL | 4 refills | Status: DC

## 2021-07-24 NOTE — Progress Notes (Signed)
Chief Complaint  Patient presents with   Follow-up    New rm, alone states she is doing well and stable       ASSESSMENT AND PLAN  ELIORA SVEEN is a 75 y.o. female Chronic headaches  Long history of sinusitis, sinus surgery, with some migraine features,  Responding well to preventive medication nortriptyline, 25 mg 2 tablets every night, also Inderal LA,  She rarely has headache now, every few weeks she took Tylenol as needed, works well, will taper off Inderal, keep nortriptyline,  Obstructive sleep apnea  Tolerating CPAP machine, which also helped her headache   DIAGNOSTIC DATA (LABS, IMAGING, TESTING) - I reviewed patient records, labs, notes, testing and imaging myself where available.   MEDICAL HISTORY:  KATHALEEN LOU is a 75 year old female, seen in request by her primary care physician Leeroy Cha for evaluation  of head pressure, frequent transient sharp pain, initial evaluation was February 13, 2020.   She has past medical history of hypertension, hyperlipidemia, I saw her initially in 2016 for similar complaints,   She had long history of "sinus headache", with frequent sinus symptoms, runny nose, watering eyes, for many years she self treated with Claritin, around 2014, she had worsening of her headaches, presented to the emergency room few times around the period of time, I personally reviewed MRI of brain in May 2014, no acute abnormality, mild supratentorium small vessel disease, there is also evidence of acute left sphenoid sinus disease, chronic opacification of the right sphenoid sinus   She was started on Inderal 50 mg daily, which has helped her headache moderately, she also complains of early morning headache, was later referred to sleep study, confirmed obstructive sleep apnea, was prescribed CPAP machine, later nortriptyline 50 mg every night was also added on, her headache was apparently under good control for a while, she lost to follow-up.    She had bilateral revision endoscopic sinus surgery bilateral total ethmoidectomy, bilateral maxillary antrostomy, with removal of diseased tissue, lateral nasal frontal recess exploration by ENT  Dr. Wilburn Cornelia in November 2020   Despite the sinus surgery, she continue complains of returning of her headaches, she complains of constant pressure in her head," my head is as big as this room", in addition she complains of transient sharp piercing pain involving different spot in her skull, lasting for few seconds, she denies visual change, hearing loss, no jaw claudication,   She continue complains of frequent sinus symptoms, nasal discharge, she does nasal wash few times each day, with dried discharge.  She was not able to tolerate her CPAP machine since her surgery, taking Tylenol at least 4 tablets on a daily basis   She also complains of sudden onset right lateral thigh area paresthesia above knee since February 2020, she had a history of bilateral knee replacement, mild low back pain,   UPDATE Sept 14 2022: She still has intermittent headache but overall has much improved, tolerating nortriptyline 50 mg every night, Inderal LA 60 mg daily, take Tylenol as needed every few weeks, she had a history of chronic sinusitis, had previous sinus surgery, no sleep better with nortriptyline, also was diagnosed with obstructive sleep apnea, compliant with CPAP machine, which has helped her headache,    PHYSICAL EXAM:   Vitals:   07/24/21 1058  BP: 133/79  Pulse: 68  Weight: 212 lb (96.2 kg)  Height: '5\' 5"'$  (1.651 m)   Not recorded     Body mass index is 35.28 kg/m.  PHYSICAL EXAMNIATION:  Gen: NAD, conversant, well nourised, well groomed             NEUROLOGICAL EXAM:  MENTAL STATUS: Speech/cognition: Awake, alert, oriented to history taking and casual conversation   CRANIAL NERVES: CN II: Visual fields are full to confrontation. Pupils are round equal and briskly reactive to light. CN  III, IV, VI: extraocular movement are normal. No ptosis. CN V: Facial sensation is intact to light touch CN VII: Face is symmetric with normal eye closure  CN VIII: Hearing is normal to causal conversation. CN IX, X: Phonation is normal. CN XI: Head turning and shoulder shrug are intact  MOTOR: Normal muscle tone, bulk, and strength  REFLEXES: Reflexes are 1 and symmetric at the biceps, triceps, knees, and ankles. Plantar responses are flexor.  SENSORY: Intact to light touch, pinprick and vibratory sensation are intact in fingers and toes.  COORDINATION: There is no trunk or limb dysmetria noted.  GAIT/STANCE: Normal gait  REVIEW OF SYSTEMS:  Full 14 system review of systems performed and notable only for as above All other review of systems were negative.   ALLERGIES: Allergies  Allergen Reactions   Other Other (See Comments)    Watery eyes , itching - Grass, Rag Weed, Mold, Smoke    HOME MEDICATIONS: Current Outpatient Medications  Medication Sig Dispense Refill   acetaminophen (TYLENOL) 500 MG tablet Take 1,000 mg by mouth every 6 (six) hours as needed for mild pain or headache.     aspirin EC 81 MG tablet Take 81 mg by mouth daily.     atorvastatin (LIPITOR) 10 MG tablet Take 10 mg by mouth at bedtime.      benazepril (LOTENSIN) 40 MG tablet Take 40 mg by mouth daily.      Calcium Carb-Cholecalciferol (CALCIUM 600+D3 PO) Take 1 tablet by mouth 2 (two) times daily.     celecoxib (CELEBREX) 200 MG capsule Take 200 mg by mouth every other day.      fluocinonide cream (LIDEX) 0.05 % APP EXTERNALLY UNDER THE BREAST BID     fluticasone (FLONASE) 50 MCG/ACT nasal spray Place into both nostrils daily as needed for allergies or rhinitis.     lidocaine-prilocaine (EMLA) cream Apply 1 application topically 3 (three) times daily as needed. 30 g 11   loratadine (CLARITIN) 10 MG tablet Take by mouth.     Multiple Vitamins-Minerals (MULTIVITAMIN WITH MINERALS) tablet Take 1 tablet  by mouth daily.     nortriptyline (PAMELOR) 25 MG capsule Take 2 capsules (50 mg total) by mouth at bedtime. 180 capsule 0   pantoprazole (PROTONIX) 40 MG tablet Take 40 mg by mouth daily.      propranolol ER (INDERAL LA) 60 MG 24 hr capsule Take 1 capsule (60 mg total) by mouth at bedtime. Please call [phone] to schedule appt. 30 capsule 2   No current facility-administered medications for this visit.    PAST MEDICAL HISTORY: Past Medical History:  Diagnosis Date   Arthritis    Carotid artery disease ([place])    a. duplex 07/2017 - 1-39% stenosis bilaterally.   Chronic frontal sinusitis    Elevated liver enzymes    per pt   Environmental and seasonal allergies    GERD (gastroesophageal reflux disease)    Hyperlipidemia    Hypertension    Migraines    sinus headaches, no longer having migraines   OSA (obstructive sleep apnea)    CPAP   Peripheral neuropathy    Peripheral vascular disease (  [place])    carotid blockage - is followed by Dr. Marlou Porch   Pre-diabetes    Snores    Wears glasses     PAST SURGICAL HISTORY: Past Surgical History:  Procedure Laterality Date   ABDOMINAL HYSTERECTOMY  1970   Total HYST   BREAST BIOPSY Bilateral 2001   benign   BREAST EXCISIONAL BIOPSY Right 2013   sclerosing lesion   BREAST LUMPECTOMY WITH RADIOACTIVE SEED LOCALIZATION Left 08/31/2018   Procedure: BREAST LUMPECTOMY WITH RADIOACTIVE SEED LOCALIZATION;  Surgeon: Fanny Skates, MD;  Location: [place];  Service: General;  Laterality: Left;   COLONOSCOPY     JOINT REPLACEMENT Left 2001   Left Knee Replacement   KNEE ARTHROSCOPY Left    lt   NASAL SINUS SURGERY     NASAL SINUS SURGERY Bilateral 09/30/2019   Procedure: ENDOSCOPIC SINUS SURGERY;  Surgeon: Jerrell Belfast, MD;  Location: [place];  Service: ENT;  Laterality: Bilateral;   TOE SURGERY     left foot digit # 2   TOTAL KNEE ARTHROPLASTY Right 10/12/2018   Procedure: TOTAL KNEE ARTHROPLASTY;  Surgeon: Melrose Nakayama, MD;  Location:  [place];  Service: Orthopedics;  Laterality: Right;   WISDOM TOOTH EXTRACTION      FAMILY HISTORY: Family History  Problem Relation Age of Onset   Cancer Mother    Hypertension Mother    Hypertension Father    Heart disease Father        MI around 48, died at 44   [place] Sister        Brain Ca   CAD Daughter        Had bypass in her 6s (type 1 diabetic), died at 65    SOCIAL HISTORY: Social History   Socioeconomic History   Marital status: Widowed    Spouse name: Laverna Peace   Number of children: 2   Years of education: 11   Highest education level: Not on file  Occupational History   Occupation: retired    Comment: works partime with vendors  Tobacco Use   Smoking status: Never   Smokeless tobacco: Never  Scientific laboratory technician Use: Never used  Substance and Sexual Activity   Alcohol use: No   Drug use: No   Sexual activity: Not on file  Other Topics Concern   Not on file  Social History Narrative   Patient is widowed. Patient is retired . 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 Patient has a 11th grade education. She has two children.   Social Determinants of Health   Financial Resource Strain: Not on file  Food Insecurity: Not on file  Transportation Needs: Not on file  Physical Activity: Not on file  Stress: Not on file  Social Connections: Not on file  Intimate Partner Violence: Not on file      Marcial Pacas, M.D. Ph.D.  [facility] [address] [place], [place] [address] Ph: [phone] Fax: [phone]  CC:  Leeroy Cha, MD [address],  [place] [address]  Leeroy Cha, MD

## 2021-07-30 ENCOUNTER — Telehealth: Payer: Self-pay | Admitting: Neurology

## 2021-07-30 NOTE — Telephone Encounter (Signed)
Notes from her visit on 07/29/21:  She rarely has headache now, every few weeks she took Tylenol as needed, works well, will taper off Inderal, keep nortriptyline.  _____________________________________  I spoke to the patient and reviewed this information with the patient. She verbalized understanding.

## 2021-07-30 NOTE — Telephone Encounter (Signed)
Pt called wanting to know which prescription Dr. Krista Blue advised her not to take anymore, pt cant remember, requesting a call back.

## 2021-08-06 ENCOUNTER — Other Ambulatory Visit: Payer: Self-pay | Admitting: Internal Medicine

## 2021-08-06 DIAGNOSIS — Z1231 Encounter for screening mammogram for malignant neoplasm of breast: Secondary | ICD-10-CM

## 2021-09-02 DIAGNOSIS — Z23 Encounter for immunization: Secondary | ICD-10-CM | POA: Diagnosis not present

## 2021-09-02 DIAGNOSIS — M62830 Muscle spasm of back: Secondary | ICD-10-CM | POA: Diagnosis not present

## 2021-09-02 DIAGNOSIS — E559 Vitamin D deficiency, unspecified: Secondary | ICD-10-CM | POA: Diagnosis not present

## 2021-09-02 DIAGNOSIS — L659 Nonscarring hair loss, unspecified: Secondary | ICD-10-CM | POA: Diagnosis not present

## 2021-09-02 DIAGNOSIS — I10 Essential (primary) hypertension: Secondary | ICD-10-CM | POA: Diagnosis not present

## 2021-09-02 DIAGNOSIS — I779 Disorder of arteries and arterioles, unspecified: Secondary | ICD-10-CM | POA: Diagnosis not present

## 2021-09-16 ENCOUNTER — Encounter: Payer: Self-pay | Admitting: Cardiology

## 2021-09-16 ENCOUNTER — Other Ambulatory Visit: Payer: Self-pay

## 2021-09-16 ENCOUNTER — Ambulatory Visit
Admission: RE | Admit: 2021-09-16 | Discharge: 2021-09-16 | Disposition: A | Discharge status: Home / Self Care | Payer: Medicare Other | Source: Ambulatory Visit | Attending: Internal Medicine | Admitting: Internal Medicine

## 2021-09-16 ENCOUNTER — Encounter (INDEPENDENT_AMBULATORY_CARE_PROVIDER_SITE_OTHER): Payer: Self-pay

## 2021-09-16 ENCOUNTER — Ambulatory Visit (INDEPENDENT_AMBULATORY_CARE_PROVIDER_SITE_OTHER): Payer: Medicare Other | Admitting: Cardiology

## 2021-09-16 VITALS — BP 144/80 | HR 100 | Ht 65.0 in | Wt 215.0 lb

## 2021-09-16 DIAGNOSIS — Z1231 Encounter for screening mammogram for malignant neoplasm of breast: Secondary | ICD-10-CM | POA: Diagnosis not present

## 2021-09-16 DIAGNOSIS — M79605 Pain in left leg: Secondary | ICD-10-CM | POA: Diagnosis not present

## 2021-09-16 DIAGNOSIS — R079 Chest pain, unspecified: Secondary | ICD-10-CM | POA: Diagnosis not present

## 2021-09-16 DIAGNOSIS — I10 Essential (primary) hypertension: Secondary | ICD-10-CM

## 2021-09-16 DIAGNOSIS — E785 Hyperlipidemia, unspecified: Secondary | ICD-10-CM | POA: Diagnosis not present

## 2021-09-16 DIAGNOSIS — E782 Mixed hyperlipidemia: Secondary | ICD-10-CM | POA: Diagnosis not present

## 2021-09-16 DIAGNOSIS — M79604 Pain in right leg: Secondary | ICD-10-CM | POA: Diagnosis not present

## 2021-09-16 DIAGNOSIS — I6523 Occlusion and stenosis of bilateral carotid arteries: Secondary | ICD-10-CM

## 2021-09-16 DIAGNOSIS — I700 Atherosclerosis of aorta: Secondary | ICD-10-CM | POA: Diagnosis not present

## 2021-09-16 DIAGNOSIS — Z79899 Other long term (current) drug therapy: Secondary | ICD-10-CM

## 2021-09-16 MED ORDER — ROSUVASTATIN CALCIUM 20 MG PO TABS: 20.0000 mg | ORAL_TABLET | Freq: Every day | ORAL | 3 refills | Status: DC

## 2021-09-16 NOTE — Patient Instructions (Signed)
Medication Instructions:  Please discontinue your Atorvastatin and start Crestor 20 mg a day. Continue all other medications as listed.  *If you need a refill on your cardiac medications before your next appointment, please call your pharmacy*  Lab Work: Please have blood work in 3 months (Lipid/ALT)  If you have labs (blood work) drawn today and your tests are completely normal, you will receive your results only by: [place] (if you have MyChart) OR A paper copy in the mail If you have any lab test that is abnormal or we need to change your treatment, we will call you to review the results.  Testing/Procedures: Your physician has requested that you have a lower extremity arterial exercise duplex. During this test, exercise and ultrasound are used to evaluate arterial blood flow in the legs. Allow one hour for this exam. There are no restrictions or special instructions.  This testing is completed at our [facility] office.  Follow-Up: At [facility], you and your health needs are our priority.  As part of our continuing mission to provide you with exceptional heart care, we have created designated Provider Care Teams.  These Care Teams include your primary Cardiologist (physician) and Advanced Practice Providers (APPs -  Physician Assistants and Nurse Practitioners) who all work together to provide you with the care you need, when you need it.  We recommend signing up for the patient portal called "MyChart".  Sign up information is provided on this After Visit Summary.  MyChart is used to connect with patients for Virtual Visits (Telemedicine).  Patients are able to view lab/test results, encounter notes, upcoming appointments, etc.  Non-urgent messages can be sent to your provider as well.   To learn more about what you can do with MyChart, go to [email or web address].    Your next appointment:   1 year(s)  The format for your next appointment:   In Person  Provider:    Candee Furbish, MD    Thank you for choosing [facility]!!

## 2021-09-16 NOTE — Assessment & Plan Note (Signed)
Frequently change her atorvastatin 10 mg over to Crestor 20 mg.  Her LDL most recently checked was 112.  I would like for this to be less than 70 given her carotid plaque.  We will recheck lipid panel in 3 months with ALT.

## 2021-09-16 NOTE — Assessment & Plan Note (Signed)
If symptoms become more worrisome, we will have low threshold for repeating stress test.  2018 was unremarkable.

## 2021-09-16 NOTE — Assessment & Plan Note (Signed)
Mild noted bilaterally.  Changing statin over to Crestor 20.  Continue with good blood pressure control.  Asymptomatic.

## 2021-09-16 NOTE — Assessment & Plan Note (Signed)
Changing to Crestor 20 mg.

## 2021-09-16 NOTE — Assessment & Plan Note (Signed)
Mildly elevated here.  Usually at home and is in within normal range.  Continue with benazepril 40 mg once a day.  Hydrochlorothiazide 25 mg a day.

## 2021-09-16 NOTE — Progress Notes (Signed)
Cardiology Office Note:    Date:  09/16/2021   ID:  Pamela Hendricks, DOB 23-Feb-1946, MRN [id number]  PCP:  Pamela Cha, MD  Cardiologist:  Candee Furbish, MD  Electrophysiologist:  None   Referring MD: Pamela Hendricks,*     History of Present Illness:    Pamela Hendricks is a 75 y.o. female here for follow-up of carotid artery disease GERD hypertension hyperlipidemia obstructive sleep apnea.  In review of Pamela Hendricks's note from 07/30/2017 she had undergone a mobile screening fair that showed carotid artery disease.  Also noted atherosclerotic changes of aorta on noninvasive imaging.  Her husband used to be a patient of Dr. Doreatha Lew.  During that prior encounter she was having episodic chest pain for 5 years with atypical features like a pulling component.  Exercise treadmill test on 03/31/2017 showed mildly reduced exercise tolerance hypertensive response to exercise 216/82 otherwise normal.  Nuclear stress test was then performed that was reassuring, low risk.  She ended up having a foot surgery.  She had a carotid ultrasound performed on 07/15/2017 that showed 1 to 39% stenosis bilaterally, no follow-up.  She continues with low-dose atorvastatin, low-dose aspirin, beta-blocker.  09/07/2019- presented for preoperative risk stratification prior to ENT surgery with ENT - Dr. Claiborne Rigg wanted to perform nasal/sinus surgery. She reported drainage.  She has had occasional chest discomfort off and on.  This is similar to what we were describing back in 2018 when she had a stress test that was overall low risk.  Low risk nuclear stress test. She was asking about the carotid plaque that she had.  Would like to get another carotid scan.   Today, she is doing well. Occasionally, she reports chest pain. The severity fluctuates but had not gotten worse. The pain has not occurred more frequently, but, she noticed the pain has become more heavy. The pain can last for several minutes and she  experiences associated shortness of breath.  She also reports bilateral LE cramps. When she walks, her inner thighs will begin burning. The cramps and pain limit her ability to walk.  She is compliant and tolerating her atorvastatin. Her blood pressure at home typically 112/60 on average. The measurement today was higher which she attributes to white coat syndrome.   She denies any palpitations. No lightheadedness, headaches, syncope, orthopnea, PND, or lower extremity edema.   Past Medical History:  Diagnosis Date   Arthritis    Carotid artery disease ([place])    a. duplex 07/2017 - 1-39% stenosis bilaterally.   Chronic frontal sinusitis    Elevated liver enzymes    per pt   Environmental and seasonal allergies    GERD (gastroesophageal reflux disease)    Hyperlipidemia    Hypertension    Migraines    sinus headaches, no longer having migraines   OSA (obstructive sleep apnea)    CPAP   Peripheral neuropathy    Peripheral vascular disease ([place])    carotid blockage - is followed by Dr. Marlou Porch   Pre-diabetes    Snores    Wears glasses     Past Surgical History:  Procedure Laterality Date   ABDOMINAL HYSTERECTOMY  1970   Total HYST   BREAST BIOPSY Bilateral 2001   benign   BREAST EXCISIONAL BIOPSY Right 2013   sclerosing lesion   BREAST LUMPECTOMY WITH RADIOACTIVE SEED LOCALIZATION Left 08/31/2018   Procedure: BREAST LUMPECTOMY WITH RADIOACTIVE SEED LOCALIZATION;  Surgeon: Fanny Skates, MD;  Location: [facility];  Service: General;  Laterality:  Left;   COLONOSCOPY     JOINT REPLACEMENT Left 2001   Left Knee Replacement   KNEE ARTHROSCOPY Left    lt   NASAL SINUS SURGERY     NASAL SINUS SURGERY Bilateral 09/30/2019   Procedure: ENDOSCOPIC SINUS SURGERY;  Surgeon: Jerrell Belfast, MD;  Location: [facility];  Service: ENT;  Laterality: Bilateral;   TOE SURGERY     left foot digit # 2   TOTAL KNEE ARTHROPLASTY Right 10/12/2018   Procedure: TOTAL KNEE ARTHROPLASTY;  Surgeon:  Melrose Nakayama, MD;  Location: [facility];  Service: Orthopedics;  Laterality: Right;   WISDOM TOOTH EXTRACTION      Current Medications: Current Meds  Medication Sig   acetaminophen (TYLENOL) 500 MG tablet Take 1,000 mg by mouth every 6 (six) hours as needed for mild pain or headache.   aspirin EC 81 MG tablet Take 81 mg by mouth daily.   benazepril (LOTENSIN) 40 MG tablet Take 40 mg by mouth daily.    Calcium Carb-Cholecalciferol (CALCIUM 600+D3 PO) Take 1 tablet by mouth 2 (two) times daily.   celecoxib (CELEBREX) 200 MG capsule Take 200 mg by mouth every other day.    fluocinonide cream (LIDEX) 0.05 % APP EXTERNALLY UNDER THE BREAST BID   fluticasone (FLONASE) 50 MCG/ACT nasal spray Place into both nostrils daily as needed for allergies or rhinitis.   hydrochlorothiazide (HYDRODIURIL) 25 MG tablet Take 25 mg by mouth daily.   lidocaine-prilocaine (EMLA) cream Apply 1 application topically 3 (three) times daily as needed.   loratadine (CLARITIN) 10 MG tablet Take by mouth.   Multiple Vitamins-Minerals (MULTIVITAMIN WITH MINERALS) tablet Take 1 tablet by mouth daily.   nortriptyline (PAMELOR) 25 MG capsule Take 2 capsules (50 mg total) by mouth at bedtime.   pantoprazole (PROTONIX) 40 MG tablet Take 40 mg by mouth daily.    rosuvastatin (CRESTOR) 20 MG tablet Take 1 tablet (20 mg total) by mouth daily.   [DISCONTINUED] atorvastatin (LIPITOR) 10 MG tablet Take 10 mg by mouth at bedtime.      Allergies:   Other   Social History   Socioeconomic History   Marital status: Widowed    Spouse name: Laverna Peace   Number of children: 2   Years of education: 11   Highest education level: Not on file  Occupational History   Occupation: retired    Comment: works partime with vendors  Tobacco Use   Smoking status: Never   Smokeless tobacco: Never  Scientific laboratory technician Use: Never used  Substance and Sexual Activity   Alcohol use: No   Drug use: No   Sexual activity: Not on file  Other Topics  Concern   Not on file  Social History Narrative   Patient is widowed. Patient is retired . 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 Patient has a 11th grade education. She has two children.   Social Determinants of Health   Financial Resource Strain: Not on file  Food Insecurity: Not on file  Transportation Needs: Not on file  Physical Activity: Not on file  Stress: Not on file  Social Connections: Not on file     Family History: The patient's family history includes CAD in her daughter; Cancer in her brother, mother, and sister; Heart disease in her father; Hypertension in her father and mother.  ROS:   Please see the history of present illness.    (+) Bilateral LE cramps (+) Bilateral LE pain (+) Chest pain (+) Shortness  of breath  All other systems reviewed and are negative.  EKGs/Labs/Other Studies Reviewed:    The following studies were reviewed today: Prior labs, EKG, office notes, stress test  Carotid Arterial Duplex 09/14/2019 Right Carotid: Velocities in the right ICA are consistent with a 1-39%  stenosis.  Left Carotid: Velocities in the left ICA are consistent with a 1-39%  stenosis.  Vertebrals:  Bilateral vertebral arteries demonstrate antegrade flow.  Subclavians: Normal flow hemodynamics were seen in bilateral subclavian arteries.   NUC stress 2018 Nuclear stress EF: 75%. There was no ST segment deviation noted during stress. Defect 1: There is a small defect of moderate severity present in the apical anterior and apex location. This is a low risk study. The left ventricular ejection fraction is hyperdynamic (>65%). Low risk stress nuclear study with mild soft tissue attenuation; no ischemia; EF 75 with normal wall motion.   EKG: EKG was personally reviewed and interpreted 09/16/21: Sinus tachycardia, rate 100 bpm 09/07/19 -sinus rhythm no other abnormalities.   08/16/2018-sinus rhythm 70 with no other abnormalities.   Recent Labs: No  results found for requested labs within last 8760 hours.  Recent Lipid Panel    Component Value Date/Time   CHOL 124 11/16/2017 0906   TRIG 89 11/16/2017 0906   HDL 49 11/16/2017 0906   CHOLHDL 2.5 11/16/2017 0906   LDLCALC 57 11/16/2017 0906    Physical Exam:    VS:  BP (!) 144/80 (BP Location: Left Arm, Patient Position: Sitting, Cuff Size: Normal)   Pulse 100   Ht 5\' 5"  (1.651 m)   Wt 215 lb (97.5 kg)   SpO2 96%   BMI 35.78 kg/m     Wt Readings from Last 3 Encounters:  09/16/21 215 lb (97.5 kg)  07/24/21 212 lb (96.2 kg)  10/16/20 217 lb (98.4 kg)     GEN:  Well nourished, well developed in no acute distress HEENT: Normal NECK: No JVD; No carotid bruits LYMPHATICS: No lymphadenopathy CARDIAC: RRR, no murmurs, rubs, gallops RESPIRATORY:  Clear to auscultation without rales, wheezing or rhonchi  ABDOMEN: Soft, non-tender, non-distended, + BS MUSCULOSKELETAL:  No edema; No deformity  SKIN: Warm and dry NEUROLOGIC:  Alert and oriented x 3 PSYCHIATRIC:  Normal affect    ASSESSMENT:    1. Hyperlipidemia LDL goal <70   2. Essential hypertension   3. Medication management   4. Pain in both lower extremities   5. Mixed hyperlipidemia   6. Carotid artery plaque, bilateral   7. Aortic atherosclerosis ([place])   8. Chest pain of uncertain etiology     PLAN:   Pain in both lower extremities With her claudication and as above, we will check a lower extremity arterial ultrasound/ABI.  Essential hypertension Mildly elevated here.  Usually at home and is in within normal range.  Continue with benazepril 40 mg once a day.  Hydrochlorothiazide 25 mg a day.  Mixed hyperlipidemia Frequently change her atorvastatin 10 mg over to Crestor 20 mg.  Her LDL most recently checked was 112.  I would like for this to be less than 70 given her carotid plaque.  We will recheck lipid panel in 3 months with ALT.  Carotid artery plaque, bilateral Mild noted bilaterally.  Changing statin  over to Crestor 20.  Continue with good blood pressure control.  Asymptomatic.  Aortic atherosclerosis ([facility]) Changing to Crestor 20 mg.  Chest pain of uncertain etiology If symptoms become more worrisome, we will have low threshold for repeating stress test.  2018 was unremarkable.  In order of problems listed above:  Cardiac risk stratification -She may proceed with ENT surgery with low overall cardiac risk based upon her most recent nuclear stress test in 2018.  She has had atypical type chest discomfort off for quite some time.  If any further assistance is necessary, please let us know.  Carotid artery plaque -Mild 1 to 39% bilaterally.  Continue with statin, aspirin, low-dose.  Excellent.  Blood pressure control is excellent.  No changes made.  She would like for [place] to check the carotid Dopplers again.  It has been 2 years.  This is fine.  Aortic atherosclerosis -Seen on noninvasive imaging.  Continue with statin.  Aspirin.  No signs of bleeding.  Continue.  Obesity -BMI 33.  Decrease carbohydrates.  Hard for her to exercise because of orthopedic/foot issues/knee issues.  Dr. Latanya Maudlin has been taking care of her.  Doing very well.  Essential hypertension -Under excellent control with benazepril.  No changes made.  Hyperlipidemia -Low-dose atorvastatin-LDL 85 in April 2019.  Excellent.  No myalgias.   Medication Adjustments/Labs and Tests Ordered: Current medicines are reviewed at length with the patient today.  Concerns regarding medicines are outlined above.  Orders Placed This Encounter  Procedures   ALT   Lipid panel   EKG 12-Lead   VAS [place] ABI WITH/WO TBI   VAS [place] LOWER EXTREMITY ARTERIAL DUPLEX    Meds ordered this encounter  Medications   rosuvastatin (CRESTOR) 20 MG tablet    Sig: Take 1 tablet (20 mg total) by mouth daily.    Dispense:  90 tablet    Refill:  3     Patient Instructions  Medication Instructions:  Please discontinue your Atorvastatin and  start Crestor 20 mg a day. Continue all other medications as listed.  *If you need a refill on your cardiac medications before your next appointment, please call your pharmacy*  Lab Work: Please have blood work in 3 months (Lipid/ALT)  If you have labs (blood work) drawn today and your tests are completely normal, you will receive your results only by: [facility] (if you have MyChart) OR A paper copy in the mail If you have any lab test that is abnormal or we need to change your treatment, we will call you to review the results.  Testing/Procedures: Your physician has requested that you have a lower extremity arterial exercise duplex. During this test, exercise and ultrasound are used to evaluate arterial blood flow in the legs. Allow one hour for this exam. There are no restrictions or special instructions.  This testing is completed at our [facility] office.  Follow-Up: At [facility], you and your health needs are our priority.  As part of our continuing mission to provide you with exceptional heart care, we have created designated Provider Care Teams.  These Care Teams include your primary Cardiologist (physician) and Advanced Practice Providers (APPs -  Physician Assistants and Nurse Practitioners) who all work together to provide you with the care you need, when you need it.  We recommend signing up for the patient portal called "MyChart".  Sign up information is provided on this After Visit Summary.  MyChart is used to connect with patients for Virtual Visits (Telemedicine).  Patients are able to view lab/test results, encounter notes, upcoming appointments, etc.  Non-urgent messages can be sent to your provider as well.   To learn more about what you can do with MyChart, go to [email or web address].  Your next appointment:   1 year(s)  The format for your next appointment:   In Person  Provider:   Candee Furbish, MD    Thank you for choosing [facility]!!     Wilhemina Bonito as a scribe for Candee Furbish, MD.,have documented all relevant documentation on the behalf of Candee Furbish, MD,as directed by  Candee Furbish, MD while in the presence of Candee Furbish, MD.  I, Candee Furbish, MD, have reviewed all documentation for this visit. The documentation on 09/16/21 for the exam, diagnosis, procedures, and orders are all accurate and complete.   Signed, Candee Furbish, MD  09/16/2021 11:00 AM    [place]

## 2021-09-16 NOTE — Assessment & Plan Note (Signed)
With her claudication and as above, we will check a lower extremity arterial ultrasound/ABI.

## 2021-10-08 ENCOUNTER — Ambulatory Visit (HOSPITAL_COMMUNITY)
Admission: RE | Admit: 2021-10-08 | Discharge: 2021-10-08 | Disposition: A | Discharge status: Home / Self Care | Payer: Medicare Other | Source: Ambulatory Visit | Attending: Cardiovascular Disease | Admitting: Cardiovascular Disease

## 2021-10-08 ENCOUNTER — Other Ambulatory Visit: Payer: Self-pay

## 2021-10-08 DIAGNOSIS — M79605 Pain in left leg: Secondary | ICD-10-CM | POA: Diagnosis not present

## 2021-10-08 DIAGNOSIS — M79604 Pain in right leg: Secondary | ICD-10-CM

## 2021-10-16 DIAGNOSIS — L439 Lichen planus, unspecified: Secondary | ICD-10-CM | POA: Diagnosis not present

## 2021-10-17 ENCOUNTER — Ambulatory Visit (INDEPENDENT_AMBULATORY_CARE_PROVIDER_SITE_OTHER): Payer: Medicare Other | Admitting: Family Medicine

## 2021-10-17 ENCOUNTER — Encounter: Payer: Self-pay | Admitting: Family Medicine

## 2021-10-17 VITALS — BP 131/78 | HR 94 | Ht 65.0 in | Wt 215.0 lb

## 2021-10-17 DIAGNOSIS — G4733 Obstructive sleep apnea (adult) (pediatric): Secondary | ICD-10-CM

## 2021-10-17 DIAGNOSIS — R350 Frequency of micturition: Secondary | ICD-10-CM | POA: Diagnosis not present

## 2021-10-17 DIAGNOSIS — N3281 Overactive bladder: Secondary | ICD-10-CM | POA: Diagnosis not present

## 2021-10-17 DIAGNOSIS — G43909 Migraine, unspecified, not intractable, without status migrainosus: Secondary | ICD-10-CM

## 2021-10-17 NOTE — Patient Instructions (Addendum)
Please continue using your CPAP regularly. While your insurance requires that you use CPAP at least 4 hours each night on 70% of the nights, I recommend, that you not skip any nights and use it throughout the night if you can. Getting used to CPAP and staying with the treatment long term does take time and patience and discipline. Untreated obstructive sleep apnea when it is moderate to severe can have an adverse impact on cardiovascular health and raise her risk for heart disease, arrhythmias, hypertension, congestive heart failure, stroke and diabetes. Untreated obstructive sleep apnea causes sleep disruption, nonrestorative sleep, and sleep deprivation. This can have an impact on your day to day functioning and cause daytime sleepiness and impairment of cognitive function, memory loss, mood disturbance, and problems focussing. Using CPAP regularly can improve these symptoms.  Please continue focusing on improving CPAP compliance. Try not to sleep on your back. Continue nortriptyline 50mg  at bedtime. Follow up in 6 months

## 2021-10-17 NOTE — Progress Notes (Signed)
PATIENT: Pamela Hendricks DOB: 1946/08/25  REASON FOR VISIT: follow up HISTORY FROM: patient  Chief Complaint  Patient presents with   Follow-up    Pt alone, rm 16. Pt is here for follow up. Overall stable and states that she is doing well.      HISTORY OF PRESENT ILLNESS:  Pamela Hendricks is a 75 year old female, seen in request by her primary care physician Leeroy Cha for evaluation  of head pressure, frequent transient sharp pain, initial evaluation was February 13, 2020.   She has past medical history of hypertension, hyperlipidemia, I saw her initially in 2016 for similar complaints,   She had long history of "sinus headache", with frequent sinus symptoms, runny nose, watering eyes, for many years she self treated with Claritin, around 2014, she had worsening of her headaches, presented to the emergency room few times around the period of time, I personally reviewed MRI of brain in May 2014, no acute abnormality, mild supratentorium small vessel disease, there is also evidence of acute left sphenoid sinus disease, chronic opacification of the right sphenoid sinus   She was started on Inderal 50 mg daily, which has helped her headache moderately, she also complains of early morning headache, was later referred to sleep study, confirmed obstructive sleep apnea, was prescribed CPAP machine, later nortriptyline 50 mg every night was also added on, her headache was apparently under good control for a while, she lost to follow-up.   She had bilateral revision endoscopic sinus surgery bilateral total ethmoidectomy, bilateral maxillary antrostomy, with removal of diseased tissue, lateral nasal frontal recess exploration by ENT  Dr. Wilburn Cornelia in November 2020   Despite the sinus surgery, she continue complains of returning of her headaches, she complains of constant pressure in her head," my head is as big as this room", in addition she complains of transient sharp piercing pain  involving different spot in her skull, lasting for few seconds, she denies visual change, hearing loss, no jaw claudication,   She continue complains of frequent sinus symptoms, nasal discharge, she does nasal wash few times each day, with dried discharge.  She was not able to tolerate her CPAP machine since her surgery, taking Tylenol at least 4 tablets on a daily basis   She also complains of sudden onset right lateral thigh area paresthesia above knee since February 2020, she had a history of bilateral knee replacement, mild low back pain,    Update May 03, 2020 SS: Laboratory evaluation revealed elevated ESR, normal CRP.  MRI of the brain showed chronic bilateral frontal, maxillary sinusitis, no acute intracranial abnormalities.  Remains on nortriptyline 50 mg at bedtime, Inderal LA 60 mg daily (from PCP).  Reports 1-2 headaches a week, will take Tylenol with good benefit.  No longer taking daily Tylenol.  With nortriptyline, no longer having bad headache to the back of her head.  Is yet to be back on CPAP, following sinus surgery.  She does note to be sleeping better with nortriptyline.  May have slight dry mouth as side effect.  Overall, doing well, is widowed for 16 years, remains quite active, doesn't let anything get her down, very active in church.  Presents today unaccompanied.  Update 06/19/2020 ALL: Pamela Hendricks is a 75 y.o. female here today for follow up for OSA on CPAP therapy and headaches. She continues Inderal and nortriptyline. She reports headaches are well managed. She rarely has a headache. She can abort headache with Tylenol.   She  recently restarted CPAP therapy. She has been compliant with use over the past 24 days. She has not noted significant changes in how she feels but contributes this to feeling well, overall. She has all needed supplies. She denies concerns with CPAP machine.   Compliance report dated 05/19/2020 through 06/17/2020 reveals that she used CPAP twenty-four  of the past 30 days for compliance of 80%.  She used CPAP greater than 4 hours twenty-four of the past 30 days for compliance of 80%.  Average usage was 6 hours and 29 minutes.  Residual AHI was slightly elevated at 6.1 on 5 to 18 cm of water and an EPR of two.  There was no significant leak noted.  UPDATE 10/17/2021 ALL: Pamela Hendricks returns for follow up for OSA on CPAP. She was seen by Dr Brett Fairy in 10/2020 for consideration of [facility] and advised to continue CPAP. She had follow up with Dr Krista Blue 07/2021 and reported rare headaches and compliance with CPAP. She was advised to wean off Inderal and continue nortriptyline. She reports doing very well. She has had a couple of headaches that were aborted with Tylenol. She reports having eye surgery that has interrupted CPAP usage. She hasn't had any specific issues with machine or supplies. It does make s whistling sound sometimes.     REVIEW OF SYSTEMS: Out of a complete 14 system review of symptoms, the patient complains only of the following symptoms, headache and fatigue and all other reviewed systems are negative.  ALLERGIES: Allergies  Allergen Reactions   Other Other (See Comments)    Watery eyes , itching - Grass, Rag Weed, Mold, Smoke    HOME MEDICATIONS: Outpatient Medications Prior to Visit  Medication Sig Dispense Refill   acetaminophen (TYLENOL) 500 MG tablet Take 1,000 mg by mouth every 6 (six) hours as needed for mild pain or headache.     aspirin EC 81 MG tablet Take 81 mg by mouth daily.     benazepril (LOTENSIN) 40 MG tablet Take 40 mg by mouth daily.      Calcium Carb-Cholecalciferol (CALCIUM 600+D3 PO) Take 1 tablet by mouth 2 (two) times daily.     celecoxib (CELEBREX) 200 MG capsule Take 200 mg by mouth every other day.      clobetasol ointment (TEMOVATE) 2.11 % Apply 1 application topically 2 (two) times daily.     fluocinonide cream (LIDEX) 0.05 % APP EXTERNALLY UNDER THE BREAST BID     fluticasone (FLONASE) 50 MCG/ACT nasal  spray Place into both nostrils daily as needed for allergies or rhinitis.     hydrochlorothiazide (HYDRODIURIL) 25 MG tablet Take 25 mg by mouth daily.     loratadine (CLARITIN) 10 MG tablet Take by mouth.     Multiple Vitamins-Minerals (MULTIVITAMIN WITH MINERALS) tablet Take 1 tablet by mouth daily.     nortriptyline (PAMELOR) 25 MG capsule Take 2 capsules (50 mg total) by mouth at bedtime. 180 capsule 4   pantoprazole (PROTONIX) 40 MG tablet Take 40 mg by mouth daily.      rosuvastatin (CRESTOR) 20 MG tablet Take 1 tablet (20 mg total) by mouth daily. 90 tablet 3   lidocaine-prilocaine (EMLA) cream Apply 1 application topically 3 (three) times daily as needed. 30 g 11   No facility-administered medications prior to visit.    PAST MEDICAL HISTORY: Past Medical History:  Diagnosis Date   Arthritis    Carotid artery disease ([place])    a. duplex 07/2017 - 1-39% stenosis bilaterally.   Chronic  frontal sinusitis    Elevated liver enzymes    per pt   Environmental and seasonal allergies    GERD (gastroesophageal reflux disease)    Hyperlipidemia    Hypertension    Migraines    sinus headaches, no longer having migraines   OSA (obstructive sleep apnea)    CPAP   Peripheral neuropathy    Peripheral vascular disease ([place])    carotid blockage - is followed by Dr. Marlou Porch   Pre-diabetes    Snores    Wears glasses     PAST SURGICAL HISTORY: Past Surgical History:  Procedure Laterality Date   ABDOMINAL HYSTERECTOMY  1970   Total HYST   BREAST BIOPSY Bilateral 2001   benign   BREAST EXCISIONAL BIOPSY Right 2013   sclerosing lesion   BREAST LUMPECTOMY WITH RADIOACTIVE SEED LOCALIZATION Left 08/31/2018   Procedure: BREAST LUMPECTOMY WITH RADIOACTIVE SEED LOCALIZATION;  Surgeon: Fanny Skates, MD;  Location: [facility];  Service: General;  Laterality: Left;   COLONOSCOPY     JOINT REPLACEMENT Left 2001   Left Knee Replacement   KNEE ARTHROSCOPY Left    lt   NASAL SINUS SURGERY      NASAL SINUS SURGERY Bilateral 09/30/2019   Procedure: ENDOSCOPIC SINUS SURGERY;  Surgeon: Jerrell Belfast, MD;  Location: [facility];  Service: ENT;  Laterality: Bilateral;   TOE SURGERY     left foot digit # 2   TOTAL KNEE ARTHROPLASTY Right 10/12/2018   Procedure: TOTAL KNEE ARTHROPLASTY;  Surgeon: Melrose Nakayama, MD;  Location: [facility];  Service: Orthopedics;  Laterality: Right;   WISDOM TOOTH EXTRACTION      FAMILY HISTORY: Family History  Problem Relation Age of Onset   Cancer Mother    Hypertension Mother    Hypertension Father    Heart disease Father        MI around 58, died at 26   [place] Sister        Brain Ca   CAD Daughter        Had bypass in her 37s (type 1 diabetic), died at 28    SOCIAL HISTORY: Social History   Socioeconomic History   Marital status: Widowed    Spouse name: Laverna Peace   Number of children: 2   Years of education: 11   Highest education level: Not on file  Occupational History   Occupation: retired    Comment: works partime with vendors  Tobacco Use   Smoking status: Never   Smokeless tobacco: Never  Scientific laboratory technician Use: Never used  Substance and Sexual Activity   Alcohol use: No   Drug use: No   Sexual activity: Not on file  Other Topics Concern   Not on file  Social History Narrative   Patient is widowed. Patient is retired . 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 Patient has a 11th grade education. She has two children.   Social Determinants of Health   Financial Resource Strain: Not on file  Food Insecurity: Not on file  Transportation Needs: Not on file  Physical Activity: Not on file  Stress: Not on file  Social Connections: Not on file  Intimate Partner Violence: Not on file      PHYSICAL EXAM  Vitals:   10/17/21 1004  BP: 131/78  Pulse: 94  Weight: 215 lb (97.5 kg)  Height: $Remove'5\' 5"'MOVxAZJ$  (1.651 m)    Body mass  index is 35.78 kg/m.  Generalized: Well developed, in  no acute distress  Cardiology: normal rate and rhythm, no murmur noted Respiratory: clear to auscultation bilaterally  Neurological examination  Mentation: Alert oriented to time, place, history taking. Follows all commands speech and language fluent Cranial nerve II-XII: Pupils were equal round reactive to light. Extraocular movements were full, visual field were full  Motor: The motor testing reveals 5 over 5 strength of all 4 extremities. Good symmetric motor tone is noted throughout.  Gait and station: Gait is normal.    DIAGNOSTIC DATA (LABS, IMAGING, TESTING) - I reviewed patient records, labs, notes, testing and imaging myself where available.  No flowsheet data found.   Lab Results  Component Value Date   WBC 8.7 09/27/2019   HGB 12.5 09/27/2019   HCT 39.5 09/27/2019   MCV 92.1 09/27/2019   PLT 306 09/27/2019      Component Value Date/Time   NA 139 09/27/2019 0920   NA 139 11/22/2014 1050   K 4.2 09/27/2019 0920   CL 104 09/27/2019 0920   CO2 25 09/27/2019 0920   GLUCOSE 104 (H) 09/27/2019 0920   BUN 13 09/27/2019 0920   BUN 11 11/22/2014 1050   CREATININE 0.61 09/27/2019 0920   CALCIUM 9.5 09/27/2019 0920   PROT 6.9 09/27/2019 0920   PROT 6.1 12/14/2017 1145   ALBUMIN 3.6 09/27/2019 0920   ALBUMIN 4.0 12/14/2017 1145   AST 41 09/27/2019 0920   ALT 57 (H) 09/27/2019 0920   ALKPHOS 69 09/27/2019 0920   BILITOT 0.6 09/27/2019 0920   BILITOT 0.3 12/14/2017 1145   GFRNONAA >60 09/27/2019 0920   GFRAA >60 09/27/2019 0920   Lab Results  Component Value Date   CHOL 124 11/16/2017   HDL 49 11/16/2017   LDLCALC 57 11/16/2017   TRIG 89 11/16/2017   CHOLHDL 2.5 11/16/2017   No results found for: HGBA1C No results found for: VITAMINB12 Lab Results  Component Value Date   TSH 2.560 11/22/2014       ASSESSMENT AND PLAN 75 y.o. year old female  has a past medical history of Arthritis, Carotid artery disease ([place]), Chronic frontal sinusitis, Elevated liver  enzymes, Environmental and seasonal allergies, GERD (gastroesophageal reflux disease), Hyperlipidemia, Hypertension, Migraines, OSA (obstructive sleep apnea), Peripheral neuropathy, Peripheral vascular disease ([place]), Pre-diabetes, Snores, and Wears glasses. here with     ICD-10-CM   1. OSA (obstructive sleep apnea)  G47.33 For home use only DME continuous positive airway pressure (CPAP)    2. Migraine without status migrainosus, not intractable, unspecified migraine type  G43.909        Dub Mikes reports that, overall, she is doing very well.  She feels that headaches are well-managed.  She rarely takes Tylenol for abortive therapy.  She will continue nortriptyline 50mg  QHS. Compliance report reveals sub optimal compliance.  AHI is slightly elevated.  I have asked that she continue using CPAP nightly and for greater than 4 hours each night. She was encouraged not to sleep on her back. CPAP titration showed good resolution of apnea if on her side. I will repeat a download in 4 to 6 weeks to assess AHI.  She was encouraged to continue healthy lifestyle habits.  Adequate hydration encouraged.  She will follow-up in 6 months, sooner if needed.   Set up date 11/06/2017   Orders Placed This Encounter  Procedures   For home use only DME continuous positive airway pressure (CPAP)    Supplies    Order  Specific Question:   Length of Need    Answer:   Lifetime    Order Specific Question:   Patient has OSA or probable OSA    Answer:   Yes    Order Specific Question:   Is the patient currently using CPAP in the home    Answer:   Yes    Order Specific Question:   Settings    Answer:   Other see comments    Order Specific Question:   CPAP supplies needed    Answer:   Mask, headgear, cushions, filters, heated tubing and water chamber      No orders of the defined types were placed in this encounter.     Debbora Presto, FNP-C 10/17/2021, 10:31 AM [facility] [address] [place], [place] [address] [phone]

## 2021-10-18 DIAGNOSIS — G4733 Obstructive sleep apnea (adult) (pediatric): Secondary | ICD-10-CM | POA: Diagnosis not present

## 2021-10-22 DIAGNOSIS — L439 Lichen planus, unspecified: Secondary | ICD-10-CM | POA: Diagnosis not present

## 2021-12-17 ENCOUNTER — Other Ambulatory Visit: Payer: Medicare Other | Admitting: *Deleted

## 2021-12-17 ENCOUNTER — Other Ambulatory Visit: Payer: Self-pay

## 2021-12-17 DIAGNOSIS — Z79899 Other long term (current) drug therapy: Secondary | ICD-10-CM

## 2021-12-17 DIAGNOSIS — E785 Hyperlipidemia, unspecified: Secondary | ICD-10-CM

## 2021-12-17 LAB — ALT: ALT: 93 IU/L — ABNORMAL HIGH (ref 0–32)

## 2021-12-17 LAB — LIPID PANEL
Chol/HDL Ratio: 2.5 ratio (ref 0.0–4.4)
Cholesterol, Total: 125 mg/dL (ref 100–199)
HDL: 51 mg/dL (ref >39)
LDL Chol Calc (NIH): 58 mg/dL (ref 0–99)
Triglycerides: 80 mg/dL (ref 0–149)
VLDL Cholesterol Cal: 16 mg/dL (ref 5–40)

## 2021-12-19 ENCOUNTER — Telehealth: Payer: Self-pay | Admitting: *Deleted

## 2021-12-19 DIAGNOSIS — R748 Abnormal levels of other serum enzymes: Secondary | ICD-10-CM

## 2021-12-19 DIAGNOSIS — E782 Mixed hyperlipidemia: Secondary | ICD-10-CM

## 2021-12-19 DIAGNOSIS — Z79899 Other long term (current) drug therapy: Secondary | ICD-10-CM

## 2021-12-19 NOTE — Telephone Encounter (Signed)
Pamela Pain, MD  12/18/2021  5:53 AM EST Back to Top    Good response to Crestor 20, however ALT, liver enzyme is now 93.  Let's stop the Crestor and recheck ALT in 1 month. Continue with diet and exercise.  We may need to use a statin alternative.  Candee Furbish, MD    Pt aware of the above information and will d/c the crestor.  She has been scheduled to repeat 01/16/22.

## 2022-01-13 DIAGNOSIS — R7309 Other abnormal glucose: Secondary | ICD-10-CM | POA: Diagnosis not present

## 2022-01-13 DIAGNOSIS — R748 Abnormal levels of other serum enzymes: Secondary | ICD-10-CM | POA: Diagnosis not present

## 2022-01-13 DIAGNOSIS — I10 Essential (primary) hypertension: Secondary | ICD-10-CM | POA: Diagnosis not present

## 2022-01-16 ENCOUNTER — Other Ambulatory Visit: Payer: Medicare Other

## 2022-01-16 ENCOUNTER — Other Ambulatory Visit: Payer: Self-pay

## 2022-01-16 DIAGNOSIS — R748 Abnormal levels of other serum enzymes: Secondary | ICD-10-CM | POA: Diagnosis not present

## 2022-01-16 DIAGNOSIS — Z79899 Other long term (current) drug therapy: Secondary | ICD-10-CM

## 2022-01-16 DIAGNOSIS — E782 Mixed hyperlipidemia: Secondary | ICD-10-CM

## 2022-01-16 LAB — ALT: ALT: 59 IU/L — ABNORMAL HIGH (ref 0–32)

## 2022-01-17 ENCOUNTER — Telehealth: Payer: Self-pay | Admitting: *Deleted

## 2022-01-17 DIAGNOSIS — R748 Abnormal levels of other serum enzymes: Secondary | ICD-10-CM

## 2022-01-17 NOTE — Telephone Encounter (Signed)
Chronically elevated ALT, currently 59 after holding Crestor 20 mg.  Even 2 years ago it was 72.  4 years ago 79.  ? ?I would like to refer her to [facility] to evaluate elevated LFT's and to comment if Crestor is reasonable for her to take.  ? ?Candee Furbish, MD  ? ?Pt is aware of continued elevated liver function and that she is being referral to [facility] for further evaluation.  She has seen Dr Watt Climes in the past per her report.  Order plaed.  She is aware she will be contacted by that office to be scheduled.   ?

## 2022-02-12 ENCOUNTER — Other Ambulatory Visit: Payer: Self-pay | Admitting: Gastroenterology

## 2022-02-12 DIAGNOSIS — R748 Abnormal levels of other serum enzymes: Secondary | ICD-10-CM

## 2022-02-14 ENCOUNTER — Ambulatory Visit
Admission: RE | Admit: 2022-02-14 | Discharge: 2022-02-14 | Disposition: A | Discharge status: Home / Self Care | Payer: Medicare Other | Source: Ambulatory Visit | Attending: Gastroenterology | Admitting: Gastroenterology

## 2022-02-14 DIAGNOSIS — R7989 Other specified abnormal findings of blood chemistry: Secondary | ICD-10-CM | POA: Diagnosis not present

## 2022-02-14 DIAGNOSIS — R748 Abnormal levels of other serum enzymes: Secondary | ICD-10-CM

## 2022-02-14 DIAGNOSIS — K760 Fatty (change of) liver, not elsewhere classified: Secondary | ICD-10-CM | POA: Diagnosis not present

## 2022-03-12 ENCOUNTER — Ambulatory Visit: Payer: Medicare Other | Admitting: Family Medicine

## 2022-04-08 ENCOUNTER — Other Ambulatory Visit: Payer: Self-pay | Admitting: Physician Assistant

## 2022-04-08 ENCOUNTER — Ambulatory Visit
Admission: RE | Admit: 2022-04-08 | Discharge: 2022-04-08 | Disposition: A | Discharge status: Home / Self Care | Payer: Medicare Other | Source: Ambulatory Visit | Attending: Physician Assistant | Admitting: Physician Assistant

## 2022-04-08 DIAGNOSIS — B351 Tinea unguium: Secondary | ICD-10-CM | POA: Diagnosis not present

## 2022-04-08 DIAGNOSIS — R300 Dysuria: Secondary | ICD-10-CM | POA: Diagnosis not present

## 2022-04-08 DIAGNOSIS — R0989 Other specified symptoms and signs involving the circulatory and respiratory systems: Secondary | ICD-10-CM | POA: Diagnosis not present

## 2022-04-08 DIAGNOSIS — R051 Acute cough: Secondary | ICD-10-CM | POA: Diagnosis not present

## 2022-04-08 DIAGNOSIS — R059 Cough, unspecified: Secondary | ICD-10-CM | POA: Diagnosis not present

## 2022-04-21 ENCOUNTER — Ambulatory Visit: Payer: Medicare Other | Admitting: Family Medicine

## 2022-05-15 DIAGNOSIS — G4733 Obstructive sleep apnea (adult) (pediatric): Secondary | ICD-10-CM | POA: Diagnosis not present

## 2022-05-21 DIAGNOSIS — R748 Abnormal levels of other serum enzymes: Secondary | ICD-10-CM | POA: Diagnosis not present

## 2022-05-21 DIAGNOSIS — N3281 Overactive bladder: Secondary | ICD-10-CM | POA: Diagnosis not present

## 2022-05-21 DIAGNOSIS — L439 Lichen planus, unspecified: Secondary | ICD-10-CM | POA: Diagnosis not present

## 2022-05-21 DIAGNOSIS — K649 Unspecified hemorrhoids: Secondary | ICD-10-CM | POA: Diagnosis not present

## 2022-06-18 NOTE — Progress Notes (Deleted)
PATIENT: Pamela Hendricks DOB: 05/14/1946  REASON FOR VISIT: follow up HISTORY FROM: patient  No chief complaint on file.     HISTORY OF PRESENT ILLNESS:  Pamela Hendricks is a 76 year old female, seen in request by her primary care physician Leeroy Cha for evaluation  of head pressure, frequent transient sharp pain, initial evaluation was February 13, 2020.   She has past medical history of hypertension, hyperlipidemia, I saw her initially in 2016 for similar complaints,   She had long history of "sinus headache", with frequent sinus symptoms, runny nose, watering eyes, for many years she self treated with Claritin, around 2014, she had worsening of her headaches, presented to the emergency room few times around the period of time, I personally reviewed MRI of brain in May 2014, no acute abnormality, mild supratentorium small vessel disease, there is also evidence of acute left sphenoid sinus disease, chronic opacification of the right sphenoid sinus   She was started on Inderal 50 mg daily, which has helped her headache moderately, she also complains of early morning headache, was later referred to sleep study, confirmed obstructive sleep apnea, was prescribed CPAP machine, later nortriptyline 50 mg every night was also added on, her headache was apparently under good control for a while, she lost to follow-up.   She had bilateral revision endoscopic sinus surgery bilateral total ethmoidectomy, bilateral maxillary antrostomy, with removal of diseased tissue, lateral nasal frontal recess exploration by ENT  Dr. Wilburn Cornelia in November 2020   Despite the sinus surgery, she continue complains of returning of her headaches, she complains of constant pressure in her head," my head is as big as this room", in addition she complains of transient sharp piercing pain involving different spot in her skull, lasting for few seconds, she denies visual change, hearing loss, no jaw  claudication,   She continue complains of frequent sinus symptoms, nasal discharge, she does nasal wash few times each day, with dried discharge.  She was not able to tolerate her CPAP machine since her surgery, taking Tylenol at least 4 tablets on a daily basis   She also complains of sudden onset right lateral thigh area paresthesia above knee since February 2020, she had a history of bilateral knee replacement, mild low back pain,    Update May 03, 2020 SS: Laboratory evaluation revealed elevated ESR, normal CRP.  MRI of the brain showed chronic bilateral frontal, maxillary sinusitis, no acute intracranial abnormalities.  Remains on nortriptyline 50 mg at bedtime, Inderal LA 60 mg daily (from PCP).  Reports 1-2 headaches a week, will take Tylenol with good benefit.  No longer taking daily Tylenol.  With nortriptyline, no longer having bad headache to the back of her head.  Is yet to be back on CPAP, following sinus surgery.  She does note to be sleeping better with nortriptyline.  May have slight dry mouth as side effect.  Overall, doing well, is widowed for 16 years, remains quite active, doesn't let anything get her down, very active in church.  Presents today unaccompanied.  Update 06/19/2020 ALL: Pamela Hendricks is a 76 y.o. female here today for follow up for OSA on CPAP therapy and headaches. She continues Inderal and nortriptyline. She reports headaches are well managed. She rarely has a headache. She can abort headache with Tylenol.   She recently restarted CPAP therapy. She has been compliant with use over the past 24 days. She has not noted significant changes in how she feels but contributes  this to feeling well, overall. She has all needed supplies. She denies concerns with CPAP machine.   Compliance report dated 05/19/2020 through 06/17/2020 reveals that she used CPAP twenty-four of the past 30 days for compliance of 80%.  She used CPAP greater than 4 hours twenty-four of the past 30 days  for compliance of 80%.  Average usage was 6 hours and 29 minutes.  Residual AHI was slightly elevated at 6.1 on 5 to 18 cm of water and an EPR of two.  There was no significant leak noted.  UPDATE 10/17/2021 ALL: Pamela Hendricks returns for follow up for OSA on CPAP. She was seen by Dr Brett Fairy in 10/2020 for consideration of [facility] and advised to continue CPAP. She had follow up with Dr Krista Blue 07/2021 and reported rare headaches and compliance with CPAP. She was advised to wean off Inderal and continue nortriptyline. She reports doing very well. She has had a couple of headaches that were aborted with Tylenol. She reports having eye surgery that has interrupted CPAP usage. She hasn't had any specific issues with machine or supplies. It does make s whistling sound sometimes.     UPDATE 06/19/2022 ALL: Pamela Hendricks returns for follow up for OSA on CPAP.     REVIEW OF SYSTEMS: Out of a complete 14 system review of symptoms, the patient complains only of the following symptoms, headache and fatigue and all other reviewed systems are negative.  ALLERGIES: Allergies  Allergen Reactions   Other Other (See Comments)    Watery eyes , itching - Grass, Rag Weed, Mold, Smoke    HOME MEDICATIONS: Outpatient Medications Prior to Visit  Medication Sig Dispense Refill   acetaminophen (TYLENOL) 500 MG tablet Take 1,000 mg by mouth every 6 (six) hours as needed for mild pain or headache.     aspirin EC 81 MG tablet Take 81 mg by mouth daily.     benazepril (LOTENSIN) 40 MG tablet Take 40 mg by mouth daily.      Calcium Carb-Cholecalciferol (CALCIUM 600+D3 PO) Take 1 tablet by mouth 2 (two) times daily.     celecoxib (CELEBREX) 200 MG capsule Take 200 mg by mouth every other day.      clobetasol ointment (TEMOVATE) 6.28 % Apply 1 application topically 2 (two) times daily.     fluocinonide cream (LIDEX) 0.05 % APP EXTERNALLY UNDER THE BREAST BID     fluticasone (FLONASE) 50 MCG/ACT nasal spray Place into both nostrils  daily as needed for allergies or rhinitis.     hydrochlorothiazide (HYDRODIURIL) 25 MG tablet Take 25 mg by mouth daily.     loratadine (CLARITIN) 10 MG tablet Take by mouth.     Multiple Vitamins-Minerals (MULTIVITAMIN WITH MINERALS) tablet Take 1 tablet by mouth daily.     nortriptyline (PAMELOR) 25 MG capsule Take 2 capsules (50 mg total) by mouth at bedtime. 180 capsule 4   pantoprazole (PROTONIX) 40 MG tablet Take 40 mg by mouth daily.      No facility-administered medications prior to visit.    PAST MEDICAL HISTORY: Past Medical History:  Diagnosis Date   Arthritis    Carotid artery disease ([place])    a. duplex 07/2017 - 1-39% stenosis bilaterally.   Chronic frontal sinusitis    Elevated liver enzymes    per pt   Environmental and seasonal allergies    GERD (gastroesophageal reflux disease)    Hyperlipidemia    Hypertension    Migraines    sinus headaches, no longer having migraines  OSA (obstructive sleep apnea)    CPAP   Peripheral neuropathy    Peripheral vascular disease ([place])    carotid blockage - is followed by Dr. Marlou Porch   Pre-diabetes    Snores    Wears glasses     PAST SURGICAL HISTORY: Past Surgical History:  Procedure Laterality Date   ABDOMINAL HYSTERECTOMY  1970   Total HYST   BREAST BIOPSY Bilateral 2001   benign   BREAST EXCISIONAL BIOPSY Right 2013   sclerosing lesion   BREAST LUMPECTOMY WITH RADIOACTIVE SEED LOCALIZATION Left 08/31/2018   Procedure: BREAST LUMPECTOMY WITH RADIOACTIVE SEED LOCALIZATION;  Surgeon: Fanny Skates, MD;  Location: [place];  Service: General;  Laterality: Left;   COLONOSCOPY     JOINT REPLACEMENT Left 2001   Left Knee Replacement   KNEE ARTHROSCOPY Left    lt   NASAL SINUS SURGERY     NASAL SINUS SURGERY Bilateral 09/30/2019   Procedure: ENDOSCOPIC SINUS SURGERY;  Surgeon: Jerrell Belfast, MD;  Location: [place];  Service: ENT;  Laterality: Bilateral;   TOE SURGERY     left foot digit # 2   TOTAL KNEE ARTHROPLASTY  Right 10/12/2018   Procedure: TOTAL KNEE ARTHROPLASTY;  Surgeon: Melrose Nakayama, MD;  Location: [place];  Service: Orthopedics;  Laterality: Right;   WISDOM TOOTH EXTRACTION      FAMILY HISTORY: Family History  Problem Relation Age of Onset   Cancer Mother    Hypertension Mother    Hypertension Father    Heart disease Father        MI around 58, died at 42   [place] Sister        Brain Ca   CAD Daughter        Had bypass in her 35s (type 1 diabetic), died at 63    SOCIAL HISTORY: Social History   Socioeconomic History   Marital status: Widowed    Spouse name: Laverna Peace   Number of children: 2   Years of education: 11   Highest education level: Not on file  Occupational History   Occupation: retired    Comment: works partime with vendors  Tobacco Use   Smoking status: Never   Smokeless tobacco: Never  Scientific laboratory technician Use: Never used  Substance and Sexual Activity   Alcohol use: No   Drug use: No   Sexual activity: Not on file  Other Topics Concern   Not on file  Social History Narrative   Patient is widowed. Patient is retired . 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 Patient has a 11th grade education. She has two children.   Social Determinants of Health   Financial Resource Strain: Not on file  Food Insecurity: Not on file  Transportation Needs: Not on file  Physical Activity: Not on file  Stress: Not on file  Social Connections: Not on file  Intimate Partner Violence: Not on file      PHYSICAL EXAM  There were no vitals filed for this visit.   There is no height or weight on file to calculate BMI.  Generalized: Well developed, in no acute distress  Cardiology: normal rate and rhythm, no murmur noted Respiratory: clear to auscultation bilaterally  Neurological examination  Mentation: Alert oriented to time, place, history taking. Follows all commands speech and language fluent Cranial nerve  II-XII: Pupils were equal round reactive to light. Extraocular movements were full,  visual field were full  Motor: The motor testing reveals 5 over 5 strength of all 4 extremities. Good symmetric motor tone is noted throughout.  Gait and station: Gait is normal.    DIAGNOSTIC DATA (LABS, IMAGING, TESTING) - I reviewed patient records, labs, notes, testing and imaging myself where available.      No data to display           Lab Results  Component Value Date   WBC 8.7 09/27/2019   HGB 12.5 09/27/2019   HCT 39.5 09/27/2019   MCV 92.1 09/27/2019   PLT 306 09/27/2019      Component Value Date/Time   NA 139 09/27/2019 0920   NA 139 11/22/2014 1050   K 4.2 09/27/2019 0920   CL 104 09/27/2019 0920   CO2 25 09/27/2019 0920   GLUCOSE 104 (H) 09/27/2019 0920   BUN 13 09/27/2019 0920   BUN 11 11/22/2014 1050   CREATININE 0.61 09/27/2019 0920   CALCIUM 9.5 09/27/2019 0920   PROT 6.9 09/27/2019 0920   PROT 6.1 12/14/2017 1145   ALBUMIN 3.6 09/27/2019 0920   ALBUMIN 4.0 12/14/2017 1145   AST 41 09/27/2019 0920   ALT 59 (H) 01/16/2022 0916   ALKPHOS 69 09/27/2019 0920   BILITOT 0.6 09/27/2019 0920   BILITOT 0.3 12/14/2017 1145   GFRNONAA >60 09/27/2019 0920   GFRAA >60 09/27/2019 0920   Lab Results  Component Value Date   CHOL 125 12/17/2021   HDL 51 12/17/2021   LDLCALC 58 12/17/2021   TRIG 80 12/17/2021   CHOLHDL 2.5 12/17/2021   No results found for: "HGBA1C" No results found for: "VITAMINB12" Lab Results  Component Value Date   TSH 2.560 11/22/2014     ASSESSMENT AND PLAN 76 y.o. year old female  has a past medical history of Arthritis, Carotid artery disease ([place]), Chronic frontal sinusitis, Elevated liver enzymes, Environmental and seasonal allergies, GERD (gastroesophageal reflux disease), Hyperlipidemia, Hypertension, Migraines, OSA (obstructive sleep apnea), Peripheral neuropathy, Peripheral vascular disease ([place]), Pre-diabetes, Snores, and Wears glasses. here  with   No diagnosis found.    Pamela Hendricks reports that, overall, she is doing very well.  She feels that headaches are well-managed.  She rarely takes Tylenol for abortive therapy.  She will continue nortriptyline 22m QHS. Compliance report reveals sub optimal compliance.  AHI is slightly elevated.  I have asked that she continue using CPAP nightly and for greater than 4 hours each night. She was encouraged not to sleep on her back. CPAP titration showed good resolution of apnea if on her side. I will repeat a download in 4 to 6 weeks to assess AHI.  She was encouraged to continue healthy lifestyle habits.  Adequate hydration encouraged.  She will follow-up in 6 months, sooner if needed.   Set up date 11/06/2017   No orders of the defined types were placed in this encounter.     No orders of the defined types were placed in this encounter.      ADebbora Presto FNP-C 06/18/2022, 4:28 PM [facility] [address] [place] [phone]

## 2022-06-19 ENCOUNTER — Ambulatory Visit: Payer: Medicare Other | Admitting: Family Medicine

## 2022-06-19 ENCOUNTER — Encounter: Payer: Self-pay | Admitting: Family Medicine

## 2022-06-19 DIAGNOSIS — G4733 Obstructive sleep apnea (adult) (pediatric): Secondary | ICD-10-CM

## 2022-06-20 DIAGNOSIS — Z961 Presence of intraocular lens: Secondary | ICD-10-CM | POA: Diagnosis not present

## 2022-07-07 NOTE — Progress Notes (Unsigned)
PATIENT: Pamela Hendricks DOB: 28-Jun-1946  REASON FOR VISIT: follow up HISTORY FROM: patient  No chief complaint on file.     HISTORY OF PRESENT ILLNESS:  Pamela Hendricks is a 76 year old female, seen in request by her primary care physician Leeroy Cha for evaluation  of head pressure, frequent transient sharp pain, initial evaluation was February 13, 2020.   She has past medical history of hypertension, hyperlipidemia, I saw her initially in 2016 for similar complaints,   She had long history of "sinus headache", with frequent sinus symptoms, runny nose, watering eyes, for many years she self treated with Claritin, around 2014, she had worsening of her headaches, presented to the emergency room few times around the period of time, I personally reviewed MRI of brain in May 2014, no acute abnormality, mild supratentorium small vessel disease, there is also evidence of acute left sphenoid sinus disease, chronic opacification of the right sphenoid sinus   She was started on Inderal 50 mg daily, which has helped her headache moderately, she also complains of early morning headache, was later referred to sleep study, confirmed obstructive sleep apnea, was prescribed CPAP machine, later nortriptyline 50 mg every night was also added on, her headache was apparently under good control for a while, she lost to follow-up.   She had bilateral revision endoscopic sinus surgery bilateral total ethmoidectomy, bilateral maxillary antrostomy, with removal of diseased tissue, lateral nasal frontal recess exploration by ENT  Dr. Wilburn Cornelia in November 2020   Despite the sinus surgery, she continue complains of returning of her headaches, she complains of constant pressure in her head," my head is as big as this room", in addition she complains of transient sharp piercing pain involving different spot in her skull, lasting for few seconds, she denies visual change, hearing loss, no jaw  claudication,   She continue complains of frequent sinus symptoms, nasal discharge, she does nasal wash few times each day, with dried discharge.  She was not able to tolerate her CPAP machine since her surgery, taking Tylenol at least 4 tablets on a daily basis   She also complains of sudden onset right lateral thigh area paresthesia above knee since February 2020, she had a history of bilateral knee replacement, mild low back pain,    Update May 03, 2020 SS: Laboratory evaluation revealed elevated ESR, normal CRP.  MRI of the brain showed chronic bilateral frontal, maxillary sinusitis, no acute intracranial abnormalities.  Remains on nortriptyline 50 mg at bedtime, Inderal LA 60 mg daily (from PCP).  Reports 1-2 headaches a week, will take Tylenol with good benefit.  No longer taking daily Tylenol.  With nortriptyline, no longer having bad headache to the back of her head.  Is yet to be back on CPAP, following sinus surgery.  She does note to be sleeping better with nortriptyline.  May have slight dry mouth as side effect.  Overall, doing well, is widowed for 16 years, remains quite active, doesn't let anything get her down, very active in church.  Presents today unaccompanied.  Update 06/19/2020 ALL: Pamela Hendricks is a 76 y.o. female here today for follow up for OSA on CPAP therapy and headaches. She continues Inderal and nortriptyline. She reports headaches are well managed. She rarely has a headache. She can abort headache with Tylenol.   She recently restarted CPAP therapy. She has been compliant with use over the past 24 days. She has not noted significant changes in how she feels but contributes  this to feeling well, overall. She has all needed supplies. She denies concerns with CPAP machine.   Compliance report dated 05/19/2020 through 06/17/2020 reveals that she used CPAP twenty-four of the past 30 days for compliance of 80%.  She used CPAP greater than 4 hours twenty-four of the past 30 days  for compliance of 80%.  Average usage was 6 hours and 29 minutes.  Residual AHI was slightly elevated at 6.1 on 5 to 18 cm of water and an EPR of two.  There was no significant leak noted.  UPDATE 10/17/2021 ALL: Pamela Hendricks returns for follow up for OSA on CPAP. She was seen by Dr Brett Fairy in 10/2020 for consideration of [facility] and advised to continue CPAP. She had follow up with Dr Krista Blue 07/2021 and reported rare headaches and compliance with CPAP. She was advised to wean off Inderal and continue nortriptyline. She reports doing very well. She has had a couple of headaches that were aborted with Tylenol. She reports having eye surgery that has interrupted CPAP usage. She hasn't had any specific issues with machine or supplies. It does make s whistling sound sometimes.     UPDATE 07/08/2022 ALL: Pamela Hendricks returns for follow up for OSA on CPAP.     REVIEW OF SYSTEMS: Out of a complete 14 system review of symptoms, the patient complains only of the following symptoms, headache and fatigue and all other reviewed systems are negative.  ALLERGIES: Allergies  Allergen Reactions   Other Other (See Comments)    Watery eyes , itching - Grass, Rag Weed, Mold, Smoke    HOME MEDICATIONS: Outpatient Medications Prior to Visit  Medication Sig Dispense Refill   acetaminophen (TYLENOL) 500 MG tablet Take 1,000 mg by mouth every 6 (six) hours as needed for mild pain or headache.     aspirin EC 81 MG tablet Take 81 mg by mouth daily.     benazepril (LOTENSIN) 40 MG tablet Take 40 mg by mouth daily.      Calcium Carb-Cholecalciferol (CALCIUM 600+D3 PO) Take 1 tablet by mouth 2 (two) times daily.     celecoxib (CELEBREX) 200 MG capsule Take 200 mg by mouth every other day.      clobetasol ointment (TEMOVATE) 4.85 % Apply 1 application topically 2 (two) times daily.     fluocinonide cream (LIDEX) 0.05 % APP EXTERNALLY UNDER THE BREAST BID     fluticasone (FLONASE) 50 MCG/ACT nasal spray Place into both nostrils  daily as needed for allergies or rhinitis.     hydrochlorothiazide (HYDRODIURIL) 25 MG tablet Take 25 mg by mouth daily.     loratadine (CLARITIN) 10 MG tablet Take by mouth.     Multiple Vitamins-Minerals (MULTIVITAMIN WITH MINERALS) tablet Take 1 tablet by mouth daily.     nortriptyline (PAMELOR) 25 MG capsule Take 2 capsules (50 mg total) by mouth at bedtime. 180 capsule 4   pantoprazole (PROTONIX) 40 MG tablet Take 40 mg by mouth daily.      No facility-administered medications prior to visit.    PAST MEDICAL HISTORY: Past Medical History:  Diagnosis Date   Arthritis    Carotid artery disease ([place])    a. duplex 07/2017 - 1-39% stenosis bilaterally.   Chronic frontal sinusitis    Elevated liver enzymes    per pt   Environmental and seasonal allergies    GERD (gastroesophageal reflux disease)    Hyperlipidemia    Hypertension    Migraines    sinus headaches, no longer having migraines  OSA (obstructive sleep apnea)    CPAP   Peripheral neuropathy    Peripheral vascular disease ([place])    carotid blockage - is followed by Dr. Marlou Porch   Pre-diabetes    Snores    Wears glasses     PAST SURGICAL HISTORY: Past Surgical History:  Procedure Laterality Date   ABDOMINAL HYSTERECTOMY  1970   Total HYST   BREAST BIOPSY Bilateral 2001   benign   BREAST EXCISIONAL BIOPSY Right 2013   sclerosing lesion   BREAST LUMPECTOMY WITH RADIOACTIVE SEED LOCALIZATION Left 08/31/2018   Procedure: BREAST LUMPECTOMY WITH RADIOACTIVE SEED LOCALIZATION;  Surgeon: Fanny Skates, MD;  Location: [facility];  Service: General;  Laterality: Left;   COLONOSCOPY     JOINT REPLACEMENT Left 2001   Left Knee Replacement   KNEE ARTHROSCOPY Left    lt   NASAL SINUS SURGERY     NASAL SINUS SURGERY Bilateral 09/30/2019   Procedure: ENDOSCOPIC SINUS SURGERY;  Surgeon: Jerrell Belfast, MD;  Location: [facility];  Service: ENT;  Laterality: Bilateral;   TOE SURGERY     left foot digit # 2   TOTAL KNEE ARTHROPLASTY  Right 10/12/2018   Procedure: TOTAL KNEE ARTHROPLASTY;  Surgeon: Melrose Nakayama, MD;  Location: [facility];  Service: Orthopedics;  Laterality: Right;   WISDOM TOOTH EXTRACTION      FAMILY HISTORY: Family History  Problem Relation Age of Onset   Cancer Mother    Hypertension Mother    Hypertension Father    Heart disease Father        MI around 48, died at 100   [place] Sister        Brain Ca   CAD Daughter        Had bypass in her 46s (type 1 diabetic), died at 65    SOCIAL HISTORY: Social History   Socioeconomic History   Marital status: Widowed    Spouse name: Laverna Peace   Number of children: 2   Years of education: 11   Highest education level: Not on file  Occupational History   Occupation: retired    Comment: works partime with vendors  Tobacco Use   Smoking status: Never   Smokeless tobacco: Never  Scientific laboratory technician Use: Never used  Substance and Sexual Activity   Alcohol use: No   Drug use: No   Sexual activity: Not on file  Other Topics Concern   Not on file  Social History Narrative   Patient is widowed. Patient is retired . 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 Patient has a 11th grade education. She has two children.   Social Determinants of Health   Financial Resource Strain: Not on file  Food Insecurity: Not on file  Transportation Needs: Not on file  Physical Activity: Not on file  Stress: Not on file  Social Connections: Not on file  Intimate Partner Violence: Not on file      PHYSICAL EXAM  There were no vitals filed for this visit.   There is no height or weight on file to calculate BMI.  Generalized: Well developed, in no acute distress  Cardiology: normal rate and rhythm, no murmur noted Respiratory: clear to auscultation bilaterally  Neurological examination  Mentation: Alert oriented to time, place, history taking. Follows all commands speech and language fluent Cranial nerve  II-XII: Pupils were equal round reactive to light. Extraocular movements were full,  visual field were full  Motor: The motor testing reveals 5 over 5 strength of all 4 extremities. Good symmetric motor tone is noted throughout.  Gait and station: Gait is normal.    DIAGNOSTIC DATA (LABS, IMAGING, TESTING) - I reviewed patient records, labs, notes, testing and imaging myself where available.      No data to display           Lab Results  Component Value Date   WBC 8.7 09/27/2019   HGB 12.5 09/27/2019   HCT 39.5 09/27/2019   MCV 92.1 09/27/2019   PLT 306 09/27/2019      Component Value Date/Time   NA 139 09/27/2019 0920   NA 139 11/22/2014 1050   K 4.2 09/27/2019 0920   CL 104 09/27/2019 0920   CO2 25 09/27/2019 0920   GLUCOSE 104 (H) 09/27/2019 0920   BUN 13 09/27/2019 0920   BUN 11 11/22/2014 1050   CREATININE 0.61 09/27/2019 0920   CALCIUM 9.5 09/27/2019 0920   PROT 6.9 09/27/2019 0920   PROT 6.1 12/14/2017 1145   ALBUMIN 3.6 09/27/2019 0920   ALBUMIN 4.0 12/14/2017 1145   AST 41 09/27/2019 0920   ALT 59 (H) 01/16/2022 0916   ALKPHOS 69 09/27/2019 0920   BILITOT 0.6 09/27/2019 0920   BILITOT 0.3 12/14/2017 1145   GFRNONAA >60 09/27/2019 0920   GFRAA >60 09/27/2019 0920   Lab Results  Component Value Date   CHOL 125 12/17/2021   HDL 51 12/17/2021   LDLCALC 58 12/17/2021   TRIG 80 12/17/2021   CHOLHDL 2.5 12/17/2021   No results found for: "HGBA1C" No results found for: "VITAMINB12" Lab Results  Component Value Date   TSH 2.560 11/22/2014     ASSESSMENT AND PLAN 76 y.o. year old female  has a past medical history of Arthritis, Carotid artery disease ([place]), Chronic frontal sinusitis, Elevated liver enzymes, Environmental and seasonal allergies, GERD (gastroesophageal reflux disease), Hyperlipidemia, Hypertension, Migraines, OSA (obstructive sleep apnea), Peripheral neuropathy, Peripheral vascular disease ([place]), Pre-diabetes, Snores, and Wears glasses. here  with   No diagnosis found.    Pamela Hendricks reports that, overall, she is doing very well.  She feels that headaches are well-managed.  She rarely takes Tylenol for abortive therapy.  She will continue nortriptyline 74m QHS. Compliance report reveals sub optimal compliance.  AHI is slightly elevated.  I have asked that she continue using CPAP nightly and for greater than 4 hours each night. She was encouraged not to sleep on her back. CPAP titration showed good resolution of apnea if on her side. I will repeat a download in 4 to 6 weeks to assess AHI.  She was encouraged to continue healthy lifestyle habits.  Adequate hydration encouraged.  She will follow-up in 6 months, sooner if needed.   Set up date 11/06/2017   No orders of the defined types were placed in this encounter.     No orders of the defined types were placed in this encounter.      ADebbora Presto FNP-C 07/07/2022, 4:40 PM [facility] [address] [place] [phone]

## 2022-07-07 NOTE — Patient Instructions (Incomplete)
Please continue using your CPAP regularly. While your insurance requires that you use CPAP at least 4 hours each night on 70% of the nights, I recommend, that you not skip any nights and use it throughout the night if you can. Getting used to CPAP and staying with the treatment long term does take time and patience and discipline. Untreated obstructive sleep apnea when it is moderate to severe can have an adverse impact on cardiovascular health and raise her risk for heart disease, arrhythmias, hypertension, congestive heart failure, stroke and diabetes. Untreated obstructive sleep apnea causes sleep disruption, nonrestorative sleep, and sleep deprivation. This can have an impact on your day to day functioning and cause daytime sleepiness and impairment of cognitive function, memory loss, mood disturbance, and problems focussing. Using CPAP regularly can improve these symptoms.   I am going to ask [facility] to look at your mask. You may need a different size mask.   Call your DME to schedule: DME: [facility] Phone: [phone]  Please continue monitoring for leak.   You can try taking 1 capsule of nortriptyline at bedtime if you wish. If headaches worsen after about 4 weeks, increase dose back to 2 capsules at night.   Follow up in 6 months

## 2022-07-08 ENCOUNTER — Encounter: Payer: Self-pay | Admitting: Family Medicine

## 2022-07-08 ENCOUNTER — Ambulatory Visit (INDEPENDENT_AMBULATORY_CARE_PROVIDER_SITE_OTHER): Payer: Medicare Other | Admitting: Family Medicine

## 2022-07-08 VITALS — BP 147/73 | HR 90 | Ht 65.0 in | Wt 205.0 lb

## 2022-07-08 DIAGNOSIS — G43909 Migraine, unspecified, not intractable, without status migrainosus: Secondary | ICD-10-CM | POA: Diagnosis not present

## 2022-07-08 DIAGNOSIS — G4733 Obstructive sleep apnea (adult) (pediatric): Secondary | ICD-10-CM | POA: Diagnosis not present

## 2022-07-11 DIAGNOSIS — G4733 Obstructive sleep apnea (adult) (pediatric): Secondary | ICD-10-CM | POA: Diagnosis not present

## 2022-07-17 DIAGNOSIS — K760 Fatty (change of) liver, not elsewhere classified: Secondary | ICD-10-CM | POA: Diagnosis not present

## 2022-07-17 DIAGNOSIS — Z79899 Other long term (current) drug therapy: Secondary | ICD-10-CM | POA: Diagnosis not present

## 2022-07-17 DIAGNOSIS — M1991 Primary osteoarthritis, unspecified site: Secondary | ICD-10-CM | POA: Diagnosis not present

## 2022-07-24 DIAGNOSIS — Z0000 Encounter for general adult medical examination without abnormal findings: Secondary | ICD-10-CM | POA: Diagnosis not present

## 2022-07-24 DIAGNOSIS — N3281 Overactive bladder: Secondary | ICD-10-CM | POA: Diagnosis not present

## 2022-07-24 DIAGNOSIS — I700 Atherosclerosis of aorta: Secondary | ICD-10-CM | POA: Diagnosis not present

## 2022-07-24 DIAGNOSIS — I10 Essential (primary) hypertension: Secondary | ICD-10-CM | POA: Diagnosis not present

## 2022-07-24 DIAGNOSIS — Z23 Encounter for immunization: Secondary | ICD-10-CM | POA: Diagnosis not present

## 2022-07-24 DIAGNOSIS — E785 Hyperlipidemia, unspecified: Secondary | ICD-10-CM | POA: Diagnosis not present

## 2022-07-24 DIAGNOSIS — R7303 Prediabetes: Secondary | ICD-10-CM | POA: Diagnosis not present

## 2022-07-24 DIAGNOSIS — L439 Lichen planus, unspecified: Secondary | ICD-10-CM | POA: Diagnosis not present

## 2022-07-24 DIAGNOSIS — K649 Unspecified hemorrhoids: Secondary | ICD-10-CM | POA: Diagnosis not present

## 2022-07-24 DIAGNOSIS — D509 Iron deficiency anemia, unspecified: Secondary | ICD-10-CM | POA: Diagnosis not present

## 2022-07-24 DIAGNOSIS — E559 Vitamin D deficiency, unspecified: Secondary | ICD-10-CM | POA: Diagnosis not present

## 2022-07-24 DIAGNOSIS — I779 Disorder of arteries and arterioles, unspecified: Secondary | ICD-10-CM | POA: Diagnosis not present

## 2022-08-04 ENCOUNTER — Other Ambulatory Visit: Payer: Self-pay | Admitting: Internal Medicine

## 2022-08-04 DIAGNOSIS — Z1231 Encounter for screening mammogram for malignant neoplasm of breast: Secondary | ICD-10-CM

## 2022-08-18 ENCOUNTER — Ambulatory Visit: Payer: Self-pay | Admitting: Surgery

## 2022-08-18 DIAGNOSIS — G4733 Obstructive sleep apnea (adult) (pediatric): Secondary | ICD-10-CM | POA: Diagnosis not present

## 2022-08-18 DIAGNOSIS — R39198 Other difficulties with micturition: Secondary | ICD-10-CM | POA: Diagnosis not present

## 2022-08-18 DIAGNOSIS — K643 Fourth degree hemorrhoids: Secondary | ICD-10-CM | POA: Diagnosis not present

## 2022-08-18 DIAGNOSIS — Z86010 Personal history of colonic polyps: Secondary | ICD-10-CM | POA: Diagnosis not present

## 2022-08-18 DIAGNOSIS — K5909 Other constipation: Secondary | ICD-10-CM | POA: Diagnosis not present

## 2022-08-18 DIAGNOSIS — K648 Other hemorrhoids: Secondary | ICD-10-CM | POA: Diagnosis not present

## 2022-08-18 DIAGNOSIS — I6523 Occlusion and stenosis of bilateral carotid arteries: Secondary | ICD-10-CM | POA: Diagnosis not present

## 2022-08-20 ENCOUNTER — Telehealth: Payer: Self-pay | Admitting: *Deleted

## 2022-08-20 NOTE — Telephone Encounter (Signed)
Pt agreeable to in office appt for pre op appt. Pt states she only wants to see Dr. Marlou Porch as well as she is due to see him 09/2022. I was able to find a 7 day hold slot for 08/25/22 for the pt and she is agreeable to appt 08/25/22 @ 11:40.

## 2022-08-20 NOTE — Telephone Encounter (Signed)
   Pre-operative Risk Assessment    Patient Name: Pamela Hendricks  DOB: August 22, 1946 MRN: [id number]      Request for Surgical Clearance    Procedure:   HEMORRHOIDECTOMY  Date of Surgery:  Clearance TBD                                 Surgeon:  DR. Michael Boston Surgeon's Group or Practice Name:  [facility] Phone number:  [phone] Fax number:  [phone] ATTN: Illene Regulus, CMA   Type of Clearance Requested:   - Medical ; ASA    Type of Anesthesia:  General    Additional requests/questions:    [name]   08/20/2022, 10:03 AM

## 2022-08-20 NOTE — Telephone Encounter (Signed)
Primary Cardiologist:Mark Marlou Porch, MD  Chart reviewed as part of pre-operative protocol coverage. Because of Pamela Hendricks past medical history and time since last visit, he/she will require a follow-up visit in order to better assess preoperative cardiovascular risk.  Pre-op covering staff: - Please schedule appointment and call patient to inform them. - Please contact requesting surgeon's office via preferred method (i.e, phone, fax) to inform them of need for appointment prior to surgery.  If applicable, this message will also be routed to pharmacy pool and/or primary cardiologist for input on holding anticoagulant/antiplatelet agent as requested below so that this information is available at time of patient's appointment.   Emmaline Life, NP-C  08/20/2022, 10:31 AM [address] Office [phone] Fax [phone]

## 2022-08-25 ENCOUNTER — Encounter: Payer: Self-pay | Admitting: Cardiology

## 2022-08-25 ENCOUNTER — Ambulatory Visit: Payer: Medicare Other | Attending: Cardiology | Admitting: Cardiology

## 2022-08-25 VITALS — BP 120/70 | HR 76 | Ht 65.0 in | Wt 198.0 lb

## 2022-08-25 DIAGNOSIS — Z01810 Encounter for preprocedural cardiovascular examination: Secondary | ICD-10-CM

## 2022-08-25 DIAGNOSIS — I6523 Occlusion and stenosis of bilateral carotid arteries: Secondary | ICD-10-CM

## 2022-08-25 DIAGNOSIS — I700 Atherosclerosis of aorta: Secondary | ICD-10-CM

## 2022-08-25 DIAGNOSIS — E782 Mixed hyperlipidemia: Secondary | ICD-10-CM | POA: Diagnosis not present

## 2022-08-25 DIAGNOSIS — R0789 Other chest pain: Secondary | ICD-10-CM | POA: Diagnosis not present

## 2022-08-25 NOTE — Patient Instructions (Signed)
Medication Instructions:  The current medical regimen is effective;  continue present plan and medications.  *If you need a refill on your cardiac medications before your next appointment, please call your pharmacy*  Follow-Up: At [facility], you and your health needs are our priority.  As part of our continuing mission to provide you with exceptional heart care, we have created designated Provider Care Teams.  These Care Teams include your primary Cardiologist (physician) and Advanced Practice Providers (APPs -  Physician Assistants and Nurse Practitioners) who all work together to provide you with the care you need, when you need it.  We recommend signing up for the patient portal called "MyChart".  Sign up information is provided on this After Visit Summary.  MyChart is used to connect with patients for Virtual Visits (Telemedicine).  Patients are able to view lab/test results, encounter notes, upcoming appointments, etc.  Non-urgent messages can be sent to your provider as well.   To learn more about what you can do with MyChart, go to [email or web address].    Your next appointment:   1 year(s)  The format for your next appointment:   In Person  Provider:   Mark Skains, MD      Important Information About Sugar       

## 2022-08-25 NOTE — Progress Notes (Signed)
Cardiology Office Note:    Date:  08/25/2022   ID:  Pamela Hendricks, DOB Feb 06, 1946, MRN [id number]  PCP:  Leeroy Cha, MD  Cardiologist:  Candee Furbish, MD  Electrophysiologist:  None   Referring MD: Leeroy Cha,*    History of Present Illness:    Pamela Hendricks is a 76 y.o. female here for follow-up and preoperative clearance for hemorrhoidectomy to be performed by Dr. Johney Maine. They have requested that she hold aspirin prior to the procedure.  Carotid artery disease, GERD, hypertension, hyperlipidemia, obstructive sleep apnea.  In review of [name]'s note from 07/30/2017 she had undergone a mobile screening fair that showed carotid artery disease.  Also noted atherosclerotic changes of aorta on noninvasive imaging.  Her husband used to be a patient of Dr. Doreatha Lew.  During that prior encounter she was having episodic chest pain for 5 years with atypical features like a pulling component.  Exercise treadmill test on 03/31/2017 showed mildly reduced exercise tolerance hypertensive response to exercise 216/82 otherwise normal.  Nuclear stress test was then performed that was reassuring, low risk.  She ended up having a foot surgery.  She had a carotid ultrasound performed on 07/15/2017 that showed 1 to 39% stenosis bilaterally, no follow-up.  She continues with low-dose atorvastatin, low-dose aspirin, beta-blocker.  In 10//2020 she had presented for preoperative risk stratification prior to ENT surgery with ENT - Dr. Claiborne Rigg wanted to perform nasal/sinus surgery. She reported drainage.  She has had occasional chest discomfort off and on.  This is similar to what we were describing back in 2018 when she had a stress test that was overall low risk.  Low risk nuclear stress test.  At her last appointment she reported occasional chest pain that had become more heavy. The pain lasted for several minutes and she experienced associated shortness of breath. She also complained of  bilateral LE cramps limiting her ability to walk. At home blood pressures were 112/60 on average. She endorsed white coat hypertension. Planned to recheck carotid dopplers as it had been 2 years.  Today: Every now and then she has a minor chest pain like a "grab" or "catch". This usually has a brief duration.  At home her blood pressures remain well controlled around 355-732 systolic. When she last saw Dr. Johney Maine it was 202 systolic.  Since her last visit she has resumed atorvastatin due to higher cholesterol levels. As of 12/2021 her LDL was 58. Previously Crestor 20 mg had been stopped due to ALT 93 in 12/2021.  She tries to stay active. However, her exercise has been limited by bilateral knee pain. She notes that her knees give out with walking long distances. She believes that she would be able to walk 1 block.  Additionally she complains of ecchymoses of her bilateral UE.  She denies any palpitations, shortness of breath, or peripheral edema. No lightheadedness, headaches, syncope, orthopnea, or PND.   Past Medical History:  Diagnosis Date   Arthritis    Carotid artery disease ([place])    a. duplex 07/2017 - 1-39% stenosis bilaterally.   Chronic frontal sinusitis    Elevated liver enzymes    per pt   Environmental and seasonal allergies    GERD (gastroesophageal reflux disease)    Hyperlipidemia    Hypertension    Migraines    sinus headaches, no longer having migraines   OSA (obstructive sleep apnea)    CPAP   Peripheral neuropathy    Peripheral vascular disease ([place])  carotid blockage - is followed by Dr. Marlou Porch   Pre-diabetes    Snores    Wears glasses     Past Surgical History:  Procedure Laterality Date   ABDOMINAL HYSTERECTOMY  1970   Total HYST   BREAST BIOPSY Bilateral 2001   benign   BREAST EXCISIONAL BIOPSY Right 2013   sclerosing lesion   BREAST LUMPECTOMY WITH RADIOACTIVE SEED LOCALIZATION Left 08/31/2018   Procedure: BREAST LUMPECTOMY WITH RADIOACTIVE  SEED LOCALIZATION;  Surgeon: Fanny Skates, MD;  Location: [place];  Service: General;  Laterality: Left;   COLONOSCOPY     JOINT REPLACEMENT Left 2001   Left Knee Replacement   KNEE ARTHROSCOPY Left    lt   NASAL SINUS SURGERY     NASAL SINUS SURGERY Bilateral 09/30/2019   Procedure: ENDOSCOPIC SINUS SURGERY;  Surgeon: Jerrell Belfast, MD;  Location: [place];  Service: ENT;  Laterality: Bilateral;   TOE SURGERY     left foot digit # 2   TOTAL KNEE ARTHROPLASTY Right 10/12/2018   Procedure: TOTAL KNEE ARTHROPLASTY;  Surgeon: Melrose Nakayama, MD;  Location: [place];  Service: Orthopedics;  Laterality: Right;   WISDOM TOOTH EXTRACTION      Current Medications: Current Meds  Medication Sig   acetaminophen (TYLENOL) 500 MG tablet Take 1,000 mg by mouth every 6 (six) hours as needed for mild pain or headache.   aspirin EC 81 MG tablet Take 81 mg by mouth daily.   atorvastatin (LIPITOR) 10 MG tablet Take 10 mg by mouth at bedtime.   benazepril (LOTENSIN) 40 MG tablet Take 40 mg by mouth daily.    Calcium Carb-Cholecalciferol (CALCIUM 600+D3 PO) Take 1 tablet by mouth 2 (two) times daily.   celecoxib (CELEBREX) 200 MG capsule Take 200 mg by mouth every other day.    clobetasol ointment (TEMOVATE) 8.67 % Apply 1 application topically 2 (two) times daily.   fluocinonide cream (LIDEX) 0.05 % APP EXTERNALLY UNDER THE BREAST BID   fluticasone (FLONASE) 50 MCG/ACT nasal spray Place into both nostrils daily as needed for allergies or rhinitis.   hydrochlorothiazide (HYDRODIURIL) 25 MG tablet Take 25 mg by mouth daily.   loratadine (CLARITIN) 10 MG tablet Take by mouth.   Multiple Vitamins-Minerals (MULTIVITAMIN WITH MINERALS) tablet Take 1 tablet by mouth daily.   nortriptyline (PAMELOR) 25 MG capsule Take 2 capsules (50 mg total) by mouth at bedtime.   pantoprazole (PROTONIX) 40 MG tablet Take 40 mg by mouth daily.      Allergies:   Other   Social History   Socioeconomic History   Marital  status: Widowed    Spouse name: Laverna Peace   Number of children: 2   Years of education: 11   Highest education level: Not on file  Occupational History   Occupation: retired    Comment: works partime with vendors  Tobacco Use   Smoking status: Never   Smokeless tobacco: Never  Scientific laboratory technician Use: Never used  Substance and Sexual Activity   Alcohol use: No   Drug use: No   Sexual activity: Not on file  Other Topics Concern   Not on file  Social History Narrative   Patient is widowed. Patient is retired . 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 Patient has a 11th grade education. She has two children.   Social Determinants of Health   Financial Resource Strain: Not on file  Food Insecurity: Not on file  Transportation Needs: Not on file  Physical Activity: Not on file  Stress: Not on file  Social Connections: Not on file     Family History: The patient's family history includes CAD in her daughter; Cancer in her brother, mother, and sister; Heart disease in her father; Hypertension in her father and mother.  ROS:   Please see the history of present illness.    (+) Bilateral knee pain (+) Chest pain (+) Ecchymoses of bilateral UE All other systems reviewed and are negative.  EKGs/Labs/Other Studies Reviewed:    The following studies were reviewed today: Prior labs, EKG, office notes, stress test  LE Doppler Study  10/08/2021: Summary:  Right: Resting right ankle-brachial index is within normal range. No  evidence of significant right lower extremity arterial disease. The right  toe-brachial index is abnormal.   Left: Resting left ankle-brachial index is within normal range. No  evidence of significant left lower extremity arterial disease. The left  toe-brachial index is normal.   Carotid Arterial Duplex 09/14/2019 Right Carotid: Velocities in the right ICA are consistent with a 1-39%  stenosis.  Left Carotid: Velocities in the left ICA are  consistent with a 1-39%  stenosis.  Vertebrals:  Bilateral vertebral arteries demonstrate antegrade flow.  Subclavians: Normal flow hemodynamics were seen in bilateral subclavian arteries.   NUC stress 2018 Nuclear stress EF: 75%. There was no ST segment deviation noted during stress. Defect 1: There is a small defect of moderate severity present in the apical anterior and apex location. This is a low risk study. The left ventricular ejection fraction is hyperdynamic (>65%). Low risk stress nuclear study with mild soft tissue attenuation; no ischemia; EF 75 with normal wall motion.   EKG: EKG was personally reviewed and interpreted 08/25/2022:  Sinus rhythm. Rate 76 bpm. 09/16/21: Sinus tachycardia, rate 100 bpm 09/07/19 -sinus rhythm no other abnormalities.   08/16/2018-sinus rhythm 70 with no other abnormalities.   Recent Labs: 01/16/2022: ALT 59   Recent Lipid Panel    Component Value Date/Time   CHOL 125 12/17/2021 1016   TRIG 80 12/17/2021 1016   HDL 51 12/17/2021 1016   CHOLHDL 2.5 12/17/2021 1016   LDLCALC 58 12/17/2021 1016    Physical Exam:    VS:  BP 120/70 (BP Location: Left Arm, Patient Position: Sitting, Cuff Size: Normal)   Pulse 76   Ht '5\' 5"'$  (1.651 m)   Wt 198 lb (89.8 kg)   BMI 32.95 kg/m     Wt Readings from Last 3 Encounters:  08/25/22 198 lb (89.8 kg)  07/08/22 205 lb (93 kg)  10/17/21 215 lb (97.5 kg)     GEN:  Well nourished, well developed in no acute distress HEENT: Normal NECK: No JVD; No carotid bruits LYMPHATICS: No lymphadenopathy CARDIAC: RRR, no murmurs, rubs, gallops RESPIRATORY:  Clear to auscultation without rales, wheezing or rhonchi  ABDOMEN: Soft, non-tender, non-distended, + BS MUSCULOSKELETAL:  No edema; No deformity  SKIN: Warm and dry; Multiple ecchymoses of bilateral forearms. NEUROLOGIC:  Alert and oriented x 3 PSYCHIATRIC:  Normal affect    ASSESSMENT:    1. Pre-operative cardiovascular examination   2. Mixed  hyperlipidemia   3. Carotid artery plaque, bilateral   4. Aortic atherosclerosis ([place])   5. Atypical chest pain      PLAN:    In order of problems listed above:  Cardiac risk stratification -She may proceed with hemorrhoidectomy, general anesthesia with low overall cardiac risk based upon her most recent nuclear stress test in 2018.  She  has had atypical type chest discomfort off for quite some time.  If any further assistance is necessary, please let us know.  Carotid artery plaque -Mild 1 to 39% bilaterally.  Continue with statin, atorvastatin 10 mg, aspirin, low-dose.  Excellent.  Blood pressure control is excellent.  No changes made.  We will likely check carotid Dopplers again next year.  Aortic atherosclerosis -Seen on noninvasive imaging.  Continue with statin.  Atorvastatin 10 mg.  Aspirin.  No signs of bleeding.  Continue.  Solar purpura - Noted on forearms.  Obesity -BMI 33.  Decrease carbohydrates.  Hard for her to exercise because of orthopedic/foot issues/knee issues.  Dr. Latanya Maudlin has been taking care of her.  Doing very well.  Essential hypertension -Under excellent control with benazepril.  No changes made.  Hyperlipidemia -Low-dose atorvastatin-LDL 85 in April 2019.  Excellent.  No myalgias.   Follow-up:  1 year.  Medication Adjustments/Labs and Tests Ordered: Current medicines are reviewed at length with the patient today.  Concerns regarding medicines are outlined above.   No orders of the defined types were placed in this encounter.  No orders of the defined types were placed in this encounter.  Patient Instructions  Medication Instructions:  The current medical regimen is effective;  continue present plan and medications.  *If you need a refill on your cardiac medications before your next appointment, please call your pharmacy*  Follow-Up: At [facility], you and your health needs are our priority.  As part of our continuing mission to  provide you with exceptional heart care, we have created designated Provider Care Teams.  These Care Teams include your primary Cardiologist (physician) and Advanced Practice Providers (APPs -  Physician Assistants and Nurse Practitioners) who all work together to provide you with the care you need, when you need it.  We recommend signing up for the patient portal called "MyChart".  Sign up information is provided on this After Visit Summary.  MyChart is used to connect with patients for Virtual Visits (Telemedicine).  Patients are able to view lab/test results, encounter notes, upcoming appointments, etc.  Non-urgent messages can be sent to your provider as well.   To learn more about what you can do with MyChart, go to [email or web address].    Your next appointment:   1 year(s)  The format for your next appointment:   In Person  Provider:   Candee Furbish, MD     Important Information About Sugar         I,Mathew Stumpf,acting as a scribe for Candee Furbish, MD.,have documented all relevant documentation on the behalf of Candee Furbish, MD,as directed by  Candee Furbish, MD while in the presence of Candee Furbish, MD.  I, Candee Furbish, MD, have reviewed all documentation for this visit. The documentation on 08/25/22 for the exam, diagnosis, procedures, and orders are all accurate and complete.   Signed, Candee Furbish, MD  08/25/2022 12:10 PM    [facility]

## 2022-08-26 NOTE — Addendum Note (Signed)
Addended by: Drue Novel I on: 08/26/2022 07:30 PM   Modules accepted: Orders

## 2022-09-04 DIAGNOSIS — R82998 Other abnormal findings in urine: Secondary | ICD-10-CM | POA: Diagnosis not present

## 2022-09-04 DIAGNOSIS — K5901 Slow transit constipation: Secondary | ICD-10-CM | POA: Diagnosis not present

## 2022-09-04 DIAGNOSIS — R748 Abnormal levels of other serum enzymes: Secondary | ICD-10-CM | POA: Diagnosis not present

## 2022-09-04 DIAGNOSIS — L900 Lichen sclerosus et atrophicus: Secondary | ICD-10-CM | POA: Diagnosis not present

## 2022-09-04 DIAGNOSIS — K219 Gastro-esophageal reflux disease without esophagitis: Secondary | ICD-10-CM | POA: Diagnosis not present

## 2022-09-04 DIAGNOSIS — N8111 Cystocele, midline: Secondary | ICD-10-CM | POA: Diagnosis not present

## 2022-09-04 DIAGNOSIS — N398 Other specified disorders of urinary system: Secondary | ICD-10-CM | POA: Diagnosis not present

## 2022-09-04 NOTE — Addendum Note (Signed)
Addended by: Maren Beach, Whitni Pasquini A on: 09/04/2022 09:01 AM   Modules accepted: Orders

## 2022-09-15 NOTE — Progress Notes (Signed)
COVID Vaccine received:  _0  No _1  Yes Date of any COVID positive Test in last 90 days:  None  PCP - Leeroy Cha, MD Cardiologist - Candee Furbish, MD Rheumatology- Leigh Aurora, MD Neurology-  Debbora Presto, NP    Chest x-ray -  EKG -  08-25-2022 Epic Stress Test - 2018  Epic ECHO -  Cardiac Cath -   PCR screen: _2  Ordered & Completed                      _3   No Order but Needs PROFEND                      _4   N/A for this surgery  Surgery Plan:  _5  Ambulatory                            _6  Outpatient in bed                            _7  Admit  Anesthesia:    _8  General  _9  Spinal                           _10   Choice _11   MAC  Bowel Prep - _12  No  _13   Yes ___General Surgery bowel prep__Patient voiced understanding of all her bowel prep instructions given to her. She has a mammogram scheduled after her PST appt today, so she will begin her prep asap afterwards.   Pacemaker / ICD device _14  No _15  Yes        Device order form faxed _16  No    _17   Yes      Faxed to:  Spinal Cord Stimulator:_18  No _19  Yes      (Remind patient to bring remote DOS) Other Implants:   History of Sleep Apnea? _20  No _21  Yes   CPAP used?- _22  No _23  Yes    Does the patient monitor blood sugar? _24  No _25  Yes  _26  N/A    Diet control only  Blood Thinner / Instructions: none Aspirin Instructions: ASA 81 mg  Per patient, she was given no instruction for holding her ASA, she has already taken it this morning and her surgery is tomorrow.   ERAS Protocol Ordered: _27  No  _28  Yes PRE-SURGERY _29  ENSURE  _30  G2  _31  No Drink Ordered  Patient is to be NPO after: 07:00 am  Comments: No arm restrictions per patient (has had multiple breast surgeries)  Activity level: Patient can climb a flight of stairs without difficulty; _32  No CP  _33  No SOB  Patient can perform ADLs without assistance.   Anesthesia review: HTN, Carotid stenosis, Pre-DM,   Patient denies shortness of breath, fever, cough and chest  pain at PAT appointment.  Patient verbalized understanding and agreement to the Pre-Surgical Instructions that were given to them at this PAT appointment. Patient was also educated of the need to review these PAT instructions again prior to his/her surgery.I reviewed the appropriate phone numbers to call if they have any and questions or concerns.

## 2022-09-15 NOTE — Patient Instructions (Signed)
SURGICAL WAITING ROOM VISITATION Patients having surgery or a procedure may have no more than 2 support people in the waiting area - these visitors may rotate in the visitor waiting room.   Children under the age of 66 must have an adult with them who is not the patient. If the patient needs to stay at the hospital during part of their recovery, the visitor guidelines for inpatient rooms apply.  PRE-OP VISITATION  Pre-op nurse will coordinate an appropriate time for 1 support person to accompany the patient in pre-op.  This support person may not rotate.  This visitor will be contacted when the time is appropriate for the visitor to come back in the pre-op area.  Please refer to the [facility] website for the visitor guidelines for Inpatients (after your surgery is over and you are in a regular room).  You are not required to quarantine at this time prior to your surgery. However, you must do this: Hand Hygiene often Do NOT share personal items Notify your provider if you are in close contact with someone who has COVID or you develop fever 100.4 or greater, new onset of sneezing, cough, sore throat, shortness of breath or body aches.  If you test positive for Covid or have been in contact with anyone that has tested positive in the last 10 days please notify you surgeon.    Your procedure is scheduled on:  Thursday  September 18, 2022  Report to [facility] Main Entrance: [place] entrance where the [employer] is available.   Report to admitting at: 07:45  AM  +++++Call this number if you have any questions or problems the morning of surgery [phone]  Do not eat food after Midnight the night prior to your surgery/procedure.  After Midnight you may have the following liquids until   07:00 AM  DAY OF SURGERY  Clear Liquid Diet Water Black Coffee (sugar ok, NO MILK/CREAM OR CREAMERS)  Tea (sugar ok, NO MILK/CREAM OR CREAMERS) regular and decaf                              Plain Jell-O  with no fruit (NO RED)                                           Fruit ices (not with fruit pulp, NO RED)                                     Popsicles (NO RED)                                                                  Juice: apple, WHITE grape, WHITE cranberry Sports drinks like Gatorade or Powerade (NO RED)                    The day of surgery:  Drink ONE (1) Pre-Surgery G2 at   07:00 AM the morning of surgery. Drink in one sitting. Do not sip.  This drink was given  to you during your hospital pre-op appointment visit. Nothing else to drink after completing the Pre-Surgery  G2 : No candy, chewing gum or throat lozenges.    FOLLOW BOWEL PREP AND ANY ADDITIONAL PRE OP INSTRUCTIONS YOU RECEIVED FROM YOUR SURGEON'S OFFICE!!!  BOWEL PREP INSTRUCTIONS for Anal/Rectal Surgery: Obtain a bottle of Milk of Magnesia at a pharmacy DAY PRIOR TO SURGERY: Switch to drinking liquids or pureed foods only Drink plenty of liquids all day to avoid getting dehydrated 10:00am: Take 2 oz (4 tablespoons) Milk of Magnesia. 2:00pm: Take 2 oz (4 tablespoons) Milk of Magnesia. Midnight: Do not eat anything solid after bedtime (midnight) the night before your surgery. BUT DO drink plenty of clear liquids (Water, Gatorade, juice, soda, coffee, tea,  etc.) up to 2 hours prior to surgery to avoid getting dehydrated. MORNING OF SURGERY Remember to not to eat anything solid that morning Hold or take medications as recommended by the hospital staff at your Preoperative visit Stop drinking liquids before you leave the house (>3 hours prior to surgery) If you have questions or problems, please call [facility] to speak to someone in the clinic department at our office,    Oral Hygiene is also important to reduce your risk of infection.        Remember - BRUSH YOUR TEETH THE MORNING OF SURGERY WITH YOUR REGULAR TOOTHPASTE  Take ONLY these medicines the morning of surgery with A  SIP OF WATER: Pantoprazole (Protonix), Oxybutynin (Ditropan) and you may use your nasal sprays and Systane Eye drops if needed.  If You have been diagnosed with Sleep Apnea - Bring CPAP mask and tubing day of surgery. We will provide you with a CPAP machine on the day of your surgery.                   You may not have any metal on your body including hair pins, jewelry, and body piercing  Do not wear make-up, lotions, powders, perfumes  or deodorant  Do not wear nail polish including gel and S&S, artificial / acrylic nails, or any other type of covering on natural nails including finger and toenails. If you have artificial nails, gel coating, etc., that needs to be removed by a nail salon, Please have this removed prior to surgery. Not doing so may mean that your surgery could be cancelled or delayed if the Surgeon or anesthesia staff feels like they are unable to monitor you safely.   Do not shave 48 hours prior to surgery to avoid nicks in your skin which may contribute to postoperative infections.   Contacts, Hearing Aids, dentures or bridgework may not be worn into surgery.   Patients discharged on the day of surgery will not be allowed to drive home.  Someone NEEDS to stay with you for the first 24 hours after anesthesia.  Do not bring your home medications to the hospital. The Pharmacy will dispense medications listed on your medication list to you during your admission in the Hospital.  Please read over the following fact sheets you were given: IF YOU HAVE QUESTIONS ABOUT YOUR PRE-OP INSTRUCTIONS, PLEASE CALL [phone]  ([place])   [place] - Preparing for Surgery Before surgery, you can play an important role.  Because skin is not sterile, your skin needs to be as free of germs as possible.  You can reduce the number of germs on your skin by washing with CHG (chlorahexidine gluconate) soap before surgery.  CHG is an  antiseptic cleaner which kills germs and bonds with the skin to  continue killing germs even after washing. Please DO NOT use if you have an allergy to CHG or antibacterial soaps.  If your skin becomes reddened/irritated stop using the CHG and inform your nurse when you arrive at Short Stay. Do not shave (including legs and underarms) for at least 48 hours prior to the first CHG shower.  You may shave your face/neck.  Please follow these instructions carefully:  1.  Shower with CHG Soap the night before surgery and the  morning of surgery.  2.  If you choose to wash your hair, wash your hair first as usual with your normal  shampoo.  3.  After you shampoo, rinse your hair and body thoroughly to remove the shampoo.                             4.  Use CHG as you would any other liquid soap.  You can apply chg directly to the skin and wash.  Gently with a scrungie or clean washcloth.  5.  Apply the CHG Soap to your body ONLY FROM THE NECK DOWN.   Do not use on face/ open                           Wound or open sores. Avoid contact with eyes, ears mouth and genitals (private parts).                       Wash face,  Genitals (private parts) with your normal soap.             6.  Wash thoroughly, paying special attention to the area where your  surgery  will be performed.  7.  Thoroughly rinse your body with warm water from the neck down.  8.  DO NOT shower/wash with your normal soap after using and rinsing off the CHG Soap.            9.  Pat yourself dry with a clean towel.            10.  Wear clean pajamas.            11.  Place clean sheets on your bed the night of your first shower and do not  sleep with pets.  ON THE DAY OF SURGERY : Do not apply any lotions/deodorants the morning of surgery.  Please wear clean clothes to the hospital/surgery center.    FAILURE TO FOLLOW THESE INSTRUCTIONS MAY RESULT IN THE CANCELLATION OF YOUR SURGERY  PATIENT SIGNATURE_________________________________  NURSE  SIGNATURE__________________________________  ________________________________________________________________________

## 2022-09-17 ENCOUNTER — Other Ambulatory Visit: Payer: Self-pay

## 2022-09-17 ENCOUNTER — Encounter (HOSPITAL_COMMUNITY): Payer: Self-pay

## 2022-09-17 ENCOUNTER — Ambulatory Visit
Admission: RE | Admit: 2022-09-17 | Discharge: 2022-09-17 | Disposition: A | Discharge status: Home / Self Care | Payer: Medicare Other | Source: Ambulatory Visit | Attending: Internal Medicine | Admitting: Internal Medicine

## 2022-09-17 ENCOUNTER — Encounter (HOSPITAL_COMMUNITY)
Admission: RE | Admit: 2022-09-17 | Discharge: 2022-09-17 | Disposition: A | Discharge status: Home / Self Care | Payer: Medicare Other | Source: Ambulatory Visit | Attending: Surgery | Admitting: Surgery

## 2022-09-17 VITALS — BP 120/60 | HR 72 | Temp 98.0°F | Resp 16 | Ht 65.0 in | Wt 196.0 lb

## 2022-09-17 DIAGNOSIS — R7303 Prediabetes: Secondary | ICD-10-CM | POA: Diagnosis not present

## 2022-09-17 DIAGNOSIS — Z01812 Encounter for preprocedural laboratory examination: Secondary | ICD-10-CM | POA: Insufficient documentation

## 2022-09-17 DIAGNOSIS — Z1231 Encounter for screening mammogram for malignant neoplasm of breast: Secondary | ICD-10-CM

## 2022-09-17 DIAGNOSIS — I10 Essential (primary) hypertension: Secondary | ICD-10-CM | POA: Insufficient documentation

## 2022-09-17 LAB — BASIC METABOLIC PANEL
Anion gap: 8 (ref 5–15)
BUN: 24 mg/dL — ABNORMAL HIGH (ref 8–23)
CO2: 28 mmol/L (ref 22–32)
Calcium: 9.1 mg/dL (ref 8.9–10.3)
Chloride: 102 mmol/L (ref 98–111)
Creatinine, Ser: 0.67 mg/dL (ref 0.44–1.00)
GFR, Estimated: >60 mL/min (ref >60)
Glucose, Bld: 90 mg/dL (ref 70–99)
Potassium: 3.9 mmol/L (ref 3.5–5.1)
Sodium: 138 mmol/L (ref 135–145)

## 2022-09-17 LAB — CBC
HCT: 38.2 % (ref 36.0–46.0)
Hemoglobin: 12.3 g/dL (ref 12.0–15.0)
MCH: 31.5 pg (ref 26.0–34.0)
MCHC: 32.2 g/dL (ref 30.0–36.0)
MCV: 97.9 fL (ref 80.0–100.0)
Platelets: 266 10*3/uL (ref 150–400)
RBC: 3.9 MIL/uL (ref 3.87–5.11)
RDW: 12.7 % (ref 11.5–15.5)
WBC: 7.3 10*3/uL (ref 4.0–10.5)
nRBC: 0 % (ref 0.0–0.2)

## 2022-09-17 LAB — HEMOGLOBIN A1C
Hgb A1c MFr Bld: 5.4 % (ref 4.8–5.6)
Mean Plasma Glucose: 108.28 mg/dL

## 2022-09-17 LAB — GLUCOSE, CAPILLARY: Glucose-Capillary: 80 mg/dL (ref 70–99)

## 2022-09-18 ENCOUNTER — Encounter (HOSPITAL_COMMUNITY): Payer: Self-pay | Admitting: Surgery

## 2022-09-18 ENCOUNTER — Encounter (HOSPITAL_COMMUNITY)
Admission: RE | Disposition: A | Discharge status: Home / Self Care | Payer: Self-pay | Source: Home / Self Care | Attending: Surgery

## 2022-09-18 ENCOUNTER — Ambulatory Visit (HOSPITAL_COMMUNITY): Payer: Medicare Other | Admitting: Certified Registered Nurse Anesthetist

## 2022-09-18 ENCOUNTER — Ambulatory Visit (HOSPITAL_BASED_OUTPATIENT_CLINIC_OR_DEPARTMENT_OTHER): Payer: Medicare Other | Admitting: Certified Registered Nurse Anesthetist

## 2022-09-18 ENCOUNTER — Other Ambulatory Visit: Payer: Self-pay

## 2022-09-18 ENCOUNTER — Ambulatory Visit (HOSPITAL_COMMUNITY)
Admission: RE | Admit: 2022-09-18 | Discharge: 2022-09-18 | Disposition: A | Discharge status: Home / Self Care | Payer: Medicare Other | Attending: Surgery | Admitting: Surgery

## 2022-09-18 DIAGNOSIS — I739 Peripheral vascular disease, unspecified: Secondary | ICD-10-CM

## 2022-09-18 DIAGNOSIS — Z8719 Personal history of other diseases of the digestive system: Secondary | ICD-10-CM | POA: Insufficient documentation

## 2022-09-18 DIAGNOSIS — Z9989 Dependence on other enabling machines and devices: Secondary | ICD-10-CM | POA: Diagnosis not present

## 2022-09-18 DIAGNOSIS — K5909 Other constipation: Secondary | ICD-10-CM | POA: Diagnosis not present

## 2022-09-18 DIAGNOSIS — Z90710 Acquired absence of both cervix and uterus: Secondary | ICD-10-CM | POA: Diagnosis not present

## 2022-09-18 DIAGNOSIS — Z6832 Body mass index (BMI) 32.0-32.9, adult: Secondary | ICD-10-CM | POA: Diagnosis not present

## 2022-09-18 DIAGNOSIS — M25569 Pain in unspecified knee: Secondary | ICD-10-CM | POA: Diagnosis not present

## 2022-09-18 DIAGNOSIS — G4733 Obstructive sleep apnea (adult) (pediatric): Secondary | ICD-10-CM | POA: Diagnosis not present

## 2022-09-18 DIAGNOSIS — K643 Fourth degree hemorrhoids: Secondary | ICD-10-CM | POA: Insufficient documentation

## 2022-09-18 DIAGNOSIS — I10 Essential (primary) hypertension: Secondary | ICD-10-CM

## 2022-09-18 DIAGNOSIS — Z7982 Long term (current) use of aspirin: Secondary | ICD-10-CM | POA: Insufficient documentation

## 2022-09-18 DIAGNOSIS — E669 Obesity, unspecified: Secondary | ICD-10-CM | POA: Insufficient documentation

## 2022-09-18 DIAGNOSIS — M1990 Unspecified osteoarthritis, unspecified site: Secondary | ICD-10-CM

## 2022-09-18 DIAGNOSIS — R7303 Prediabetes: Secondary | ICD-10-CM

## 2022-09-18 HISTORY — PX: EVALUATION UNDER ANESTHESIA WITH HEMORRHOIDECTOMY: SHX5624

## 2022-09-18 SURGERY — EXAM UNDER ANESTHESIA WITH HEMORRHOIDECTOMY
Anesthesia: General | Site: Rectum

## 2022-09-18 MED ORDER — LACTATED RINGERS IV SOLN: INTRAVENOUS | Status: DC

## 2022-09-18 MED ORDER — FENTANYL CITRATE PF 50 MCG/ML IJ SOSY
25.0000 ug | PREFILLED_SYRINGE | INTRAMUSCULAR | Status: DC | PRN
  Administered 2022-09-18 (×3): 50 ug via INTRAVENOUS

## 2022-09-18 MED ORDER — HYDROMORPHONE HCL 1 MG/ML IJ SOLN
INTRAMUSCULAR | Status: AC
  Filled 2022-09-18: qty 1

## 2022-09-18 MED ORDER — LIDOCAINE HCL (PF) 2 % IJ SOLN
INTRAMUSCULAR | Status: AC
  Filled 2022-09-18: qty 5

## 2022-09-18 MED ORDER — ACETAMINOPHEN 500 MG PO TABS
1000.0000 mg | ORAL_TABLET | ORAL | Status: AC
  Administered 2022-09-18: 1000 mg via ORAL
  Filled 2022-09-18: qty 2

## 2022-09-18 MED ORDER — ONDANSETRON HCL 4 MG/2ML IJ SOLN
INTRAMUSCULAR | Status: AC
  Filled 2022-09-18: qty 2

## 2022-09-18 MED ORDER — FENTANYL CITRATE (PF) 100 MCG/2ML IJ SOLN
INTRAMUSCULAR | Status: AC
  Filled 2022-09-18: qty 2

## 2022-09-18 MED ORDER — ROCURONIUM BROMIDE 10 MG/ML (PF) SYRINGE
PREFILLED_SYRINGE | INTRAVENOUS | Status: DC | PRN
  Administered 2022-09-18: 60 mg via INTRAVENOUS

## 2022-09-18 MED ORDER — OXYCODONE HCL 5 MG/5ML PO SOLN: 5.0000 mg | Freq: Once | ORAL | Status: AC | PRN

## 2022-09-18 MED ORDER — ENSURE PRE-SURGERY PO LIQD: 296.0000 mL | Freq: Once | ORAL | Status: DC

## 2022-09-18 MED ORDER — BUPIVACAINE LIPOSOME 1.3 % IJ SUSP
INTRAMUSCULAR | Status: AC
  Filled 2022-09-18: qty 20

## 2022-09-18 MED ORDER — BUPIVACAINE LIPOSOME 1.3 % IJ SUSP: 20.0000 mL | Freq: Once | INTRAMUSCULAR | Status: DC

## 2022-09-18 MED ORDER — FENTANYL CITRATE (PF) 100 MCG/2ML IJ SOLN
INTRAMUSCULAR | Status: DC | PRN
  Administered 2022-09-18: 50 ug via INTRAVENOUS
  Administered 2022-09-18: 100 ug via INTRAVENOUS

## 2022-09-18 MED ORDER — LIDOCAINE 2% (20 MG/ML) 5 ML SYRINGE
INTRAMUSCULAR | Status: DC | PRN
  Administered 2022-09-18: 60 mg via INTRAVENOUS

## 2022-09-18 MED ORDER — FENTANYL CITRATE PF 50 MCG/ML IJ SOSY
PREFILLED_SYRINGE | INTRAMUSCULAR | Status: AC
  Filled 2022-09-18: qty 2

## 2022-09-18 MED ORDER — ORAL CARE MOUTH RINSE: 15.0000 mL | Freq: Once | OROMUCOSAL | Status: AC

## 2022-09-18 MED ORDER — PROPOFOL 10 MG/ML IV BOLUS
INTRAVENOUS | Status: AC
  Filled 2022-09-18: qty 20

## 2022-09-18 MED ORDER — DEXAMETHASONE SODIUM PHOSPHATE 10 MG/ML IJ SOLN
INTRAMUSCULAR | Status: AC
  Filled 2022-09-18: qty 1

## 2022-09-18 MED ORDER — OXYCODONE HCL 5 MG PO TABS: 5.0000 mg | ORAL_TABLET | Freq: Four times a day (QID) | ORAL | 0 refills | Status: DC | PRN

## 2022-09-18 MED ORDER — FENTANYL CITRATE PF 50 MCG/ML IJ SOSY
PREFILLED_SYRINGE | INTRAMUSCULAR | Status: AC
  Filled 2022-09-18: qty 1

## 2022-09-18 MED ORDER — DIBUCAINE (PERIANAL) 1 % EX OINT
TOPICAL_OINTMENT | CUTANEOUS | Status: AC
  Filled 2022-09-18: qty 28

## 2022-09-18 MED ORDER — DEXAMETHASONE SODIUM PHOSPHATE 10 MG/ML IJ SOLN
INTRAMUSCULAR | Status: DC | PRN
  Administered 2022-09-18: 10 mg via INTRAVENOUS

## 2022-09-18 MED ORDER — BUPIVACAINE-EPINEPHRINE (PF) 0.5% -1:200000 IJ SOLN
INTRAMUSCULAR | Status: AC
  Filled 2022-09-18: qty 30

## 2022-09-18 MED ORDER — EPHEDRINE 5 MG/ML INJ
INTRAVENOUS | Status: AC
  Filled 2022-09-18: qty 5

## 2022-09-18 MED ORDER — GABAPENTIN 300 MG PO CAPS
300.0000 mg | ORAL_CAPSULE | ORAL | Status: AC
  Administered 2022-09-18: 300 mg via ORAL
  Filled 2022-09-18: qty 1

## 2022-09-18 MED ORDER — 0.9 % SODIUM CHLORIDE (POUR BTL) OPTIME
TOPICAL | Status: DC | PRN
  Administered 2022-09-18: 1000 mL

## 2022-09-18 MED ORDER — CHLORHEXIDINE GLUCONATE CLOTH 2 % EX PADS: 6.0000 | MEDICATED_PAD | Freq: Once | CUTANEOUS | Status: DC

## 2022-09-18 MED ORDER — CHLORHEXIDINE GLUCONATE 0.12 % MT SOLN
15.0000 mL | Freq: Once | OROMUCOSAL | Status: AC
  Administered 2022-09-18: 15 mL via OROMUCOSAL

## 2022-09-18 MED ORDER — HYDROMORPHONE HCL 1 MG/ML IJ SOLN
0.2500 mg | INTRAMUSCULAR | Status: DC | PRN
  Administered 2022-09-18 (×2): 0.25 mg via INTRAVENOUS
  Administered 2022-09-18: 0.5 mg via INTRAVENOUS

## 2022-09-18 MED ORDER — SODIUM CHLORIDE 0.9 % IV SOLN
2.0000 g | INTRAVENOUS | Status: AC
  Administered 2022-09-18: 2 g via INTRAVENOUS
  Filled 2022-09-18: qty 20

## 2022-09-18 MED ORDER — ROCURONIUM BROMIDE 10 MG/ML (PF) SYRINGE
PREFILLED_SYRINGE | INTRAVENOUS | Status: AC
  Filled 2022-09-18: qty 10

## 2022-09-18 MED ORDER — OXYCODONE HCL 5 MG PO TABS
5.0000 mg | ORAL_TABLET | Freq: Once | ORAL | Status: AC | PRN
  Administered 2022-09-18: 5 mg via ORAL

## 2022-09-18 MED ORDER — PROPOFOL 10 MG/ML IV BOLUS
INTRAVENOUS | Status: DC | PRN
  Administered 2022-09-18: 120 mg via INTRAVENOUS

## 2022-09-18 MED ORDER — PHENYLEPHRINE HCL-NACL 20-0.9 MG/250ML-% IV SOLN
INTRAVENOUS | Status: DC | PRN
  Administered 2022-09-18: 35 ug/min via INTRAVENOUS

## 2022-09-18 MED ORDER — ESMOLOL HCL 100 MG/10ML IV SOLN
INTRAVENOUS | Status: DC | PRN
  Administered 2022-09-18: 20 mg via INTRAVENOUS

## 2022-09-18 MED ORDER — BUPIVACAINE-EPINEPHRINE 0.5% -1:200000 IJ SOLN
INTRAMUSCULAR | Status: DC | PRN
  Administered 2022-09-18: 30 mL

## 2022-09-18 MED ORDER — ESMOLOL HCL 100 MG/10ML IV SOLN
INTRAVENOUS | Status: AC
  Filled 2022-09-18: qty 10

## 2022-09-18 MED ORDER — KETOROLAC TROMETHAMINE 30 MG/ML IJ SOLN
INTRAMUSCULAR | Status: AC
  Filled 2022-09-18: qty 1

## 2022-09-18 MED ORDER — OXYCODONE HCL 5 MG PO TABS
ORAL_TABLET | ORAL | Status: AC
  Filled 2022-09-18: qty 1

## 2022-09-18 MED ORDER — KETOROLAC TROMETHAMINE 30 MG/ML IJ SOLN
INTRAMUSCULAR | Status: DC | PRN
  Administered 2022-09-18: 15 mg via INTRAVENOUS

## 2022-09-18 MED ORDER — BUPIVACAINE LIPOSOME 1.3 % IJ SUSP
INTRAMUSCULAR | Status: DC | PRN
  Administered 2022-09-18: 20 mL

## 2022-09-18 MED ORDER — ONDANSETRON HCL 4 MG/2ML IJ SOLN
4.0000 mg | Freq: Once | INTRAMUSCULAR | Status: AC | PRN
  Administered 2022-09-18: 4 mg via INTRAVENOUS

## 2022-09-18 SURGICAL SUPPLY — 39 items
APL SKNCLS STERI-STRIP NONHPOA (GAUZE/BANDAGES/DRESSINGS) ×1
BAG COUNTER SPONGE SURGICOUNT (BAG) IMPLANT
BAG SPNG CNTER NS LX DISP (BAG)
BENZOIN TINCTURE PRP APPL 2/3 (GAUZE/BANDAGES/DRESSINGS) ×1 IMPLANT
BLADE SURG 15 STRL LF DISP TIS (BLADE) IMPLANT
BLADE SURG 15 STRL SS (BLADE)
BRIEF MESH DISP LRG (UNDERPADS AND DIAPERS) ×1 IMPLANT
CNTNR URN SCR LID CUP LEK RST (MISCELLANEOUS) ×1 IMPLANT
CONT SPEC 4OZ STRL OR WHT (MISCELLANEOUS) ×1
COVER SURGICAL LIGHT HANDLE (MISCELLANEOUS) ×1 IMPLANT
DRAPE LAPAROTOMY T 102X78X121 (DRAPES) ×1 IMPLANT
ELECT REM PT RETURN 15FT ADLT (MISCELLANEOUS) ×1 IMPLANT
GAUZE 4X4 16PLY ~~LOC~~+RFID DBL (SPONGE) ×1 IMPLANT
GAUZE PAD ABD 7.5X8 STRL (GAUZE/BANDAGES/DRESSINGS) IMPLANT
GAUZE PAD ABD 8X10 STRL (GAUZE/BANDAGES/DRESSINGS) IMPLANT
GAUZE SPONGE 4X4 12PLY STRL (GAUZE/BANDAGES/DRESSINGS) IMPLANT
GLOVE ECLIPSE 8.0 STRL XLNG CF (GLOVE) ×1 IMPLANT
GLOVE INDICATOR 8.0 STRL GRN (GLOVE) ×1 IMPLANT
GOWN STRL REUS W/ TWL XL LVL3 (GOWN DISPOSABLE) ×3 IMPLANT
GOWN STRL REUS W/TWL XL LVL3 (GOWN DISPOSABLE) ×3
KIT BASIN OR (CUSTOM PROCEDURE TRAY) ×1 IMPLANT
KIT TURNOVER KIT A (KITS) IMPLANT
LOOP VESSEL MAXI BLUE (MISCELLANEOUS) IMPLANT
NEEDLE HYPO 22GX1.5 SAFETY (NEEDLE) ×1 IMPLANT
PACK BASIC VI WITH GOWN DISP (CUSTOM PROCEDURE TRAY) ×1 IMPLANT
PENCIL SMOKE EVACUATOR (MISCELLANEOUS) IMPLANT
SHEARS HARMONIC 9CM CVD (BLADE) IMPLANT
SPIKE FLUID TRANSFER (MISCELLANEOUS) ×1 IMPLANT
SURGILUBE 2OZ TUBE FLIPTOP (MISCELLANEOUS) ×1 IMPLANT
SUT CHROMIC 2 0 SH (SUTURE) ×1 IMPLANT
SUT CHROMIC 3 0 SH 27 (SUTURE) IMPLANT
SUT VIC AB 2-0 SH 27 (SUTURE)
SUT VIC AB 2-0 SH 27X BRD (SUTURE) IMPLANT
SUT VIC AB 2-0 UR6 27 (SUTURE) ×6 IMPLANT
SYR 20ML LL LF (SYRINGE) ×1 IMPLANT
SYR 3ML LL SCALE MARK (SYRINGE) IMPLANT
TOWEL OR 17X26 10 PK STRL BLUE (TOWEL DISPOSABLE) ×1 IMPLANT
TOWEL OR NON WOVEN STRL DISP B (DISPOSABLE) ×1 IMPLANT
YANKAUER SUCT BULB TIP 10FT TU (MISCELLANEOUS) ×1 IMPLANT

## 2022-09-18 NOTE — Discharge Instructions (Signed)
##############################################  ANORECTAL SURGERY:  POST OPERATIVE INSTRUCTIONS  ######################################################################  EAT Start with a pureed / full liquid diet After 24 hours, gradually transition to a high fiber diet.    CONTROL PAIN Control pain so you can tolerate bowel movements,  walk, sleep, tolerate sneezing/coughing, and go up/down stairs.   HAVE A BOWEL MOVEMENT DAILY Keep your bowels regular to avoid problems.   Taking a fiber supplement every day to keep bowels soft.   Try a laxative to override constipation. Use an antidairrheal to slow down diarrhea.   Call if not better after 2 tries  WALK Walk an hour a day.  Control your pain to do that.   CALL IF YOU HAVE PROBLEMS/CONCERNS Call if you are still struggling despite following these instructions. Call if you have concerns not answered by these instructions  ######################################################################    Take your usually prescribed home medications unless otherwise directed.  DIET: Follow a light bland diet & liquids the first 24 hours after arrival home, such as soup, liquids, starches, etc.  Be sure to drink plenty of fluids.  Quickly advance to a usual solid diet within a few days.  Avoid fast food or heavy meals as your are more likely to get nauseated or have irregular bowels.  A low-fat, high-fiber diet for the rest of your life is ideal.  PAIN CONTROL: Expect swelling and discomfort in the anus/rectal area. Pain is best controlled by a usual combination of many methods TOGETHER: Warm baths/soaks or Ice packs Over the counter pain medication Prescription pain medications Topical creams    Warm water baths or ice packs (30-60 minutes up to 8 times a day, especially after bowel meovements) will help. Use ice for the first few days to help decrease swelling and bruising, then switch to heat such as warm towels, sitz baths, warm  baths, warm showers, etc to help relax tight/sore spots and speed recovery.  Some people prefer to use ice alone, heat alone, alternating between ice & heat.  Experiment to what works for you.    It is helpful to take an over-the-counter pain medication continuously for the first few weeks.  Choose one of the following that works best for you: Naproxen (Aleve, etc)  Two 252m tabs twice a day Ibuprofen (Advil, etc) Three 2070mtabs four times a day (every meal & bedtime) Acetaminophen (Tylenol, etc) 500-65044mour times a day (every meal & bedtime)  A  prescription for pain medication (such as oxycodone, hydrocodone, etc) should be given to you upon discharge.  Take your pain medication as prescribed.  If you are having problems/concerns with the prescription medicine (does not control pain, nausea, vomiting, rash, itching, etc), please call us [phone] see if we need to switch you to a different pain medicine that will work better for you and/or control your side effect better. If you need a refill on your pain medication, please contact your pharmacy.  They will contact our office to request authorization. Prescriptions will not be filled after 5 pm or on week-ends.  If can take up to 48 hours for it to be filled & ready so avoid waiting until you are down to thel ast pill.  A topical cream (Dibucaine) or a prescription for a cream (such as diltiazem 2% gel) may be given to you.  Many people find relief with topical creams.  Some people find it burns too much.  Experiment.  If it helps, use it.  If it burns, don't  using it.  You also may receive a prescription for diazepam, a muscle relaxant to help you to be able to urinate and defecate more easily.  It is safe to take a few doses with the other medications as long as you are not planning to drive or do anything intense.  Hopefully this can minimize the chance of needing a Foley catheter into your bladder     KEEP YOUR BOWELS  REGULAR The goal is one soft bowel movement a day Avoid getting constipated.  Between the surgery and the pain medications, it is common to experience some constipation.  Increasing fluid intake and taking a fiber supplement (such as Metamucil, Citrucel, FiberCon, MiraLax, etc) 2-4 times a day regularly will usually help prevent this problem from occurring.  A mild laxative (prune juice, Milk of Magnesia, MiraLax, etc) should be taken according to package directions if there are no bowel movements after 48 hours. Watch out for diarrhea.  If you have many loose bowel movements, simplify your diet to bland foods & liquids for a few days.  Stop any stool softeners and decrease your fiber supplement.  Switching to mild anti-diarrheal medications (Kayopectate, Pepto Bismol) can help.  Can try an imodium/loperamide dose.  If this worsens or does not improve, please call us.  Wound Care   a. You have some fluffed gauze on top of the anus to help catch drainage and bleeding.  THERE IS NO PACKING INSIDE THE RECTUM -Let the gauze fall off with the first bowel movement or shower.  It is okay to reinforce or replace as needed.  Bleeding is common at first and occasionally tapers off   b. Place soft cotton balls on the anus/wounds and use an absorbent pad in your underwear as needed to catch any drainage and help keep the area.  Try to use cotton balls or pads over regular gauze or toilet paper as gauze will stick and pull, causing pain.  Cotton will come off more easily.   c. Keep the area clean and dry.  Bathe / shower every day.  Keep the area clean by showering / bathing over the incision / wound.   It is okay to soak an open wound to help wash it.  Consider using a squeeze bottle filled with warm water to gently wash the anal area.  Wet wipes or showers / gentle washing after bowel movements is often less traumatic than regular toilet paper.  Use a Sitz Bath 4-8 times a day for relief  A sitz bath is a warm  water bath taken in the sitting position that covers only the hips and buttocks. It may be used for either healing or hygiene purposes. Sitz baths are also used to relieve pain, itching, or muscle spasms.  Gently cleaned the area and the heat will help lower spasm and offer better pain control.    Fill the bathtub half full with warm water. Sit in the water and open the drain a little. Turn on the warm water to keep the tub half full. Keep the water running constantly. Soak in the water for 15 to 20 minutes. After the sitz bath, pat the affected area dry first.   d. You will often notice bleeding, especially with bowel movements.  This should slow down by the end of the first week of surgery.  Sitting on an ice pack can help.   e. Expect some drainage.  You often will have some blood or yellow drainage with open wounds.  Sometimes she will get a little leaking of liquid stool until the incision/wounds have fully close down.  This should slow down by the end of the first week of surgery, but you will have occasional bleeding or drainage up to a few months after surgery.  Wear an absorbent pad or soft cotton gauze in your underwear until the drainage stops.  ACTIVITIES as tolerated:    You may resume regular (light) daily activities beginning the next day--such as daily self-care, walking, climbing stairs--gradually increasing activities as tolerated.  If you can walk 30 minutes without difficulty, it is safe to try more intense activity such as jogging, treadmill, bicycling, low-impact aerobics, swimming, etc. Save the most intensive and strenuous activity for last such as sit-ups, heavy lifting, contact sports, etc  Refrain from any heavy lifting or straining until you are off narcotics for pain control.   DO NOT PUSH THROUGH PAIN.  Let pain be your guide: If it hurts to do something, don't do it.  Pain is your body warning you to avoid that activity for another week until the pain goes down. You  may drive when you are no longer taking prescription pain medication, you can comfortably sit for long periods of time, and you can safely maneuver your car and apply brakes. You may have sexual intercourse when it is comfortable.   FOLLOW UP in our office Please call [facility] at [phone] to set up an appointment to see your surgeon in the office for a follow-up appointment approximately 3 weeks after your surgery. Make sure that you call for this appointment the day you arrive home to ensure a convenient appointment time.  8. IF YOU HAVE DISABILITY OR FAMILY LEAVE FORMS, BRING THEM TO THE OFFICE FOR PROCESSING.  DO NOT GIVE THEM TO YOUR DOCTOR.        WHEN TO CALL us [phone]: Poor pain control Reactions / problems with new medications (rash/itching, nausea, etc)  Fever over 101.5 F (38.5 C) Inability to urinate Nausea and/or vomiting Worsening swelling or bruising Continued bleeding from incision. Increased pain, redness, or drainage from the incision  The clinic staff is available to answer your questions during regular business hours (8:30am-5pm).  Please don't hesitate to call and ask to speak to one of our nurses for clinical concerns.   A surgeon from [facility] is always on call at the hospitals   If you have a medical emergency, go to the nearest emergency room or call 911.    [facility], [place], [place], [place], [place]  [address] ? MAIN: [phone] ? TOLL FREE: [phone] ? FAX [phone] [email or web address]  #####################################################

## 2022-09-18 NOTE — Anesthesia Postprocedure Evaluation (Signed)
Anesthesia Post Note  Patient: Pamela Hendricks  Procedure(s) Performed: ANORECTAL EXAM UNDER ANESTHESIA WITH HEMORRHOIDECTOMY WITH LIGATION AND HEMORRHOIDOPEXY X3 (Rectum)     Patient location during evaluation: PACU Anesthesia Type: General Level of consciousness: awake and alert and oriented Pain management: pain level controlled Vital Signs Assessment: post-procedure vital signs reviewed and stable Respiratory status: spontaneous breathing, nonlabored ventilation and respiratory function stable Cardiovascular status: blood pressure returned to baseline and stable Postop Assessment: no apparent nausea or vomiting Anesthetic complications: yes   Encounter Notable Events  Notable Event Outcome Phase Comment  Difficult to intubate - expected  Intraprocedure Filed from anesthesia note documentation.    Last Vitals:  Vitals:   09/18/22 1300 09/18/22 1315  BP: (!) 151/60 (!) 148/65  Pulse: 74 73  Resp: 13 13  Temp:    SpO2: 96% 100%    Last Pain:  Vitals:   09/18/22 1315  TempSrc:   PainSc: Asleep                 Stephone Gum A.

## 2022-09-18 NOTE — Op Note (Signed)
09/18/2022  11:56 AM  PATIENT:  Pamela Hendricks  76 y.o. female  Patient Care Team: Leeroy Cha, MD as PCP - General (Internal Medicine) Jerline Pain, MD as PCP - Cardiology (Cardiology) Michael Boston, MD as Consulting Physician (General Surgery) Hennie Duos, MD as Consulting Physician (Rheumatology) Debbora Presto, NP as Nurse Practitioner (Neurology) Melrose Nakayama, MD as Consulting Physician (Orthopedic Surgery)  PRE-OPERATIVE DIAGNOSIS:  HEMORRHODIS PROLAPSED GRADE 4  POST-OPERATIVE DIAGNOSIS:  HEMORRHODIS PROLAPSED GRADE 4 WITH BLEEDING  PROCEDURE:   Internal hemorrhoidectomy x3 Internal hemorrhoidal ligation and pexy Anorectal examination under anesthesia  SURGEON:  Adin Hector, MD  ANESTHESIA:   General Anorectal & Local field block (0.25% bupivacaine with epinephrine mixed with Liposomal bupivacaine (Experel)   EBL:  Total I/O In: 100 [IV Piggyback:100] Out: 25 [Blood:25].  See operative record  Delay start of Pharmacological VTE agent (>24hrs) due to surgical blood loss or risk of bleeding:  NO  DRAINS: NONE  SPECIMEN:   Internal & external hemorrhoid x3  DISPOSITION OF SPECIMEN:  PATHOLOGY  COUNTS:  YES  PLAN OF CARE: Discharge home after PACU  PATIENT DISPOSITION:  PACU - hemodynamically stable.  INDICATION: Pleasant patient with struggles with hemorrhoids.  Not able to be managed in the office despite an improved bowel regimen.  I recommended examination under anesthesia and surgical treatment:  The anatomy & physiology of the anorectal region was discussed.  The pathophysiology of hemorrhoids and differential diagnosis was discussed.  Natural history risks without surgery was discussed.   I stressed the importance of a bowel regimen to have daily soft bowel movements to minimize progression of disease.  Interventions such as sclerotherapy & banding were discussed.  The patient's symptoms are not adequately controlled by medicines and  other non-operative treatments.  I feel the risks & problems of no surgery outweigh the operative risks; therefore, I recommended surgery to treat the hemorrhoids by ligation, pexy, and possible resection.  Risks such as bleeding, infection, need for further treatment, heart attack, death, and other risks were discussed.   I noted a good likelihood this will help address the problem.  Goals of post-operative recovery were discussed as well.  Possibility that this will not correct all symptoms was explained.  Post-operative pain, bleeding, constipation, urinary difficulties, and other problems after surgery were discussed.  We will work to minimize complications.   Educational handouts further explaining the pathology, treatment options, and bowel regimen were given as well.  Questions were answered.  The patient expresses understanding & wishes to proceed with surgery.  OR FINDINGS: Chronically prolapsed and irritated internal hemorrhoids with external components.  Right posterior most friable and area easily bleeding.  Grade 4.  Left lateral borderline grade 3/4.  Right anterior grade 2 but irritated.  No fissure, fistula, condyloma.  No other rectal masses.  No obvious proctitis.  No procidentia prolapse.  DESCRIPTION:   Informed consent was confirmed. Patient underwent general anesthesia without difficulty. Patient was placed into  prone/jacknife positioning.  The perianal region was prepped and draped in sterile fashion. Surgical time-out confirmed our plan.  I did digital rectal examination and then transitioned over to anoscopy to get a sense of the anatomy.  Findings noted above.   I proceeded to do hemorrhoidal ligation and pexy using a large self-retaining Parks retractor.  Occasionally alternating with a large Training and development officer.  I used a 2-0 Vicryl suture on a UR-6 needle in a figure-of-eight fashion 6 cm proximal to the anal verge.  I started at the largest hemorrhoid pile = right  posterior.  Partially prolapsed and quite friable..  Because of redundant hemorrhoidal tissue too bulky to merely ligate or pexy, I excised the excess internal hemorrhoid piles longitudinally in a fusiform biconcave fashion, sparing the anal canal to avoid narrowing.  I then ran that stitch longitudinally more distally to close the hemorrhoidectomy wound to the anal verge over a large Parks self-retaining anal retractor to avoid narrowing of the anal canal.  I then tied that stitch down to cause a hemorrhoidopexy.   I also had to do an excision at the  right anterior, right posterior, and left lateral pile locations.  I then did hemorrhoidal ligation and pexy at the other 3 hemorrhoidal columns.  At the completion of this, all 6 anorectal columns were ligated and pexied in the classic hexagonal fashion (right anterior/lateral/posterior, left anterior/lateral/posterior).    I redid anoscopy and examination.  Hemostasis was good.  I radially trimmed and closed closed the external part of the hemorrhoidectomy wounds with radial interrupted horizontal mattress 2-0 vicryl suture at the anal verge and chromic suture in the perirectal region over a large Sawyer anal retractor, leaving the last 5 mm open to allow natural drainage.  I repeated anoscopy & examination.  At completion of this, all hemorrhoids had been removed or reduced into the rectum.  There is no more prolapse.  Internal & external anatomy was more more normal.  Hemostasis was good.  Fluffed gauze was on-laid over the perianal region.  No packing done.  Patient is being extubated go to go to the recovery room.  I had discussed postop care in detail with the patient in the preop holding area.  Instructions for post-operative recovery and prescriptions are written. I discussed operative findings, updated the patient's status, discussed probable steps to recovery, and gave postoperative recommendations to the patient's daughter in law, Pamela Hendricks,   .   Recommendations were made.  Questions were answered.  She expressed understanding & appreciation.  Adin Hector, M.D., F.A.C.S. Gastrointestinal and Minimally Invasive Surgery [facility] [address], [place], [place] [address] [phone] Main / Paging

## 2022-09-18 NOTE — H&P (Signed)
09/18/2022    Patient Care Team: Leeroy Cha, MD as PCP - General (Internal Medicine)  PROVIDER: Hollace Kinnier, MD  [facility] MRN: [id number] DOB: [birth year]  SUBJECTIVE  Chief Complaint: No chief complaint on file.   History of Present Illness: Pamela Hendricks is a 76 y.o. female who is seen today as an office consultation at the request of Dr. Fara Olden for evaluation of No chief complaint on file. Marland Kitchen  Pleasant woman that is struggled with hemorrhoids for "several several years" she has had constipation all her life. Usually moves her bowels once or twice a week. Was using magnesium-milk of magnesia. Recommended switch to MiraLAX. History of colon polyps followed by [facility] gastroenterology. Recalls a few polyps on her colonoscopy a couple years ago. Followed up at [facility] with cardiology given some atherosclerotic disease and carotid plaques. She takes aspirin. She does not smoke. No diabetes. She struggles with knee and joint pain so can only walk about 5 minutes. No angina or shortness of breath. She does have sleep apnea. She has had a partial hysterectomy but no other abdominal surgeries. She is never had prior anorectal interventions.  She notes that she gets bleeding on a daily basis. She often has leaking and staining on her pads and occasionally blood. Frustrating. Wish to have something done. Surgical consultation offered. No history of Crohn's or inflammatory bowel disease. No prior anorectal interventions such as lancing's or hemorrhoidal banding's.  Medical History:  Past Medical History: Diagnosis Date Arthritis GERD (gastroesophageal reflux disease) Liver disease Sleep apnea  Patient Active Problem List Diagnosis Prolapsed internal hemorrhoids, grade 4 Internal hemorrhoid, bleeding  No past surgical history on file.  Allergies Allergen Reactions Other Unknown Watery eyes , itching - Grass, Rag Weed, Mold, Smoke  No current outpatient  medications on file prior to visit.  No current facility-administered medications on file prior to visit.  Family History Problem Relation Age of Onset Obesity Brother   Social History  Tobacco Use Smoking Status Never Smokeless Tobacco Never   Social History  Socioeconomic History Marital status: Widowed Tobacco Use Smoking status: Never Smokeless tobacco: Never Substance and Sexual Activity Alcohol use: Not Currently Drug use: Never  ############################################################  Review of Systems: A complete review of systems (ROS) was obtained from the patient. I have reviewed this information and discussed as appropriate with the patient. See HPI as well for other pertinent ROS.  Constitutional: No fevers, chills, sweats. Weight stable Eyes: No vision changes, No discharge HENT: No sore throats, nasal drainage Lymph: No neck swelling, No bruising easily Pulmonary: No cough, productive sputum CV: No orthopnea, PND . No exertional chest/neck/shoulder/arm pain. Patient can walk 1 block without difficulty.  GI: As of colon polyps. Followed by [facility] gastroenterology. Dr. Watt Climes. No personal nor family history of GI/colon cancer, inflammatory bowel disease, irritable bowel syndrome, allergy such as Celiac Sprue, dietary/dairy problems, colitis, ulcers nor gastritis. No recent sick contacts/gastroenteritis. No travel outside the country. No changes in diet.  Renal: No UTIs, No hematuria Genital: No drainage, bleeding, masses Musculoskeletal: No severe joint pain. Fair ROM major joints Skin: No sores or lesions Heme/Lymph: No easy bleeding. No swollen lymph nodes Neuro: No active seizures. No facial droop Psych: No hallucinations. No agitation  OBJECTIVE  There were no vitals filed for this visit. There is no height or weight on file to calculate BMI.  PHYSICAL EXAM:  Constitutional: Not cachectic. Hygeine adequate. Vitals signs as above. Eyes: No  glasses. Vision adequate,Pupils reactive, normal extraocular movements. Sclera nonicteric  Neuro: CN II-XII intact. No major focal sensory defects. No major motor deficits. Lymph: No head/neck/groin lymphadenopathy Psych: No severe agitation. No severe anxiety. Judgment & insight Adequate, Oriented x4, HENT: Normocephalic, Mucus membranes moist. No thrush. Hearing: adequate Neck: Supple, No tracheal deviation. No obvious thyromegaly Chest: No pain to chest wall compression. Good respiratory excursion. No audible wheezing CV: Pulses intact. regular. No major extremity edema Ext: No obvious deformity or contracture. Edema: Not present. No cyanosis Skin: No major subcutaneous nodules. Warm and dry Musculoskeletal: Severe joint rigidity not present. No obvious clubbing. No digital petechiae. Mobility: no assist device moving easily without restrictions  Abdomen: Obese Soft. Nondistended. Nontender. Hernia: Not present. Diastasis recti: Not present. No hepatomegaly. No splenomegaly.  Genital/Pelvic: Inguinal hernia: Not present. Inguinal lymph nodes: without lymphadenopathy nor hidradenitis.  Rectal:  ##################################  Perianal skin Mild fecal soiling Pruritis ani: Mild Pilonidal disease: Not present Condyloma / warts: Not present  Anal fissure: Not present Perirectal abscess/fistula Not present External hemorrhoids: Irritated prolapsing external hemorrhoids right posterior and left lateral grade 4. Right friable with some old thrombus in it.  Digital and anoscopic rectal exam Barely tolerated Sphincter tone Normal Hemorrhoidal piles grade 3 and 4 internal hemorrhoid piles. Largest right posterior prolapsed and friable Prostate: N/A Rectovaginal septum: Thin with mild distal rectocele Rectal masses: Not present  Other significant findings: Somewhat friable floppy rectum but no circumferential prolapse  Patient examined with patient in decubitus position  . ###################################    ###################################################################  Labs, Imaging and Diagnostic Testing:  Located in 'Care Everywhere' section of Epic EMR chart  PRIOR [facility] CLINIC NOTES:  Not applicable  SURGERY NOTES:  Not applicable  PATHOLOGY:  Not applicable  Assessment and Plan: DIAGNOSES:  Diagnoses and all orders for this visit:  Prolapsed internal hemorrhoids, grade 4  Internal hemorrhoid, bleeding  Difficulty urinating  OSA on CPAP  Carotid artery plaque, bilateral  Chronic constipation    ASSESSMENT/PLAN  Prolapsed grade 4 hemorrhoids with bleeding irritation in the setting of chronic constipation.  I strongly recommend she stay on a daily fiber supplement. I suspect she is going to need 2-4 doses of MiraLAX a day to override her severe constipation. She will take that advisement.  I think her anatomy is too stretched out and symptomatic. She needs surgery. Hemorrhoid ligation & pexy and hemorrhoidectomy.  The anatomy & physiology of the anorectal region was discussed. The pathophysiology of hemorrhoids and differential diagnosis was discussed. Natural history risks without surgery was discussed. I stressed the importance of a bowel regimen to have daily soft bowel movements to minimize progression of disease. Interventions such as sclerotherapy & banding were discussed.  The patient's symptoms are not adequately controlled by medicines and other non-operative treatments. I feel the risks & problems of no surgery outweigh the operative risks; therefore, I recommended surgery to treat the hemorrhoids by ligation, pexy, and possible resection.  Risks such as bleeding, infection, urinary difficulties, injury to other organs, need for repair of tissues / organs, need for further treatment, heart attack, death, and other risks were discussed. I noted a good likelihood this will help address the problem. Goals of  post-operative recovery were discussed as well. Possibility that this will not correct all symptoms was explained. Post-operative pain, bleeding, constipation, and other problems after surgery were discussed. We will work to minimize complications. Educational handouts further explaining the pathology, treatment options, and bowel regimen were given as well. Questions were answered. The patient expresses understanding & wishes to proceed with  surgery.  Cleared by cardiology.    Adin Hector, MD, FACS, MASCRS Esophageal, Gastrointestinal & Colorectal Surgery Robotic and Minimally Invasive Surgery  [facility] [address], [place], [place] [address] [phone] Fax [phone] Main  CONTACT INFORMATION:  Weekday (9AM-5PM): Call [facility] main office at [phone]  Weeknight (5PM-9AM) or Weekend/Holiday: Check [email or web address] (password " [email or web address]") for General Surgery [facility] coverage  (Please, do not use SecureChat as it is not reliable communication to reach operating surgeons for immediate patient care given surgeries/outpatient duties/clinic/cross-coverage/off post-call which would lead to a delay in care.  Epic staff messaging available for outptient concerns, but may not be answered for 48 hours or more).

## 2022-09-18 NOTE — Anesthesia Preprocedure Evaluation (Addendum)
Anesthesia Evaluation    Airway Mallampati: III  TM Distance: >3 FB Neck ROM: Full  Mouth opening: Limited Mouth Opening  Dental  (+) Teeth Intact, Caps, Dental Advisory Given   Pulmonary sleep apnea and Continuous Positive Airway Pressure Ventilation    Pulmonary exam normal breath sounds clear to auscultation       Cardiovascular hypertension, Pt. on medications + Peripheral Vascular Disease  Normal cardiovascular exam Rhythm:Regular Rate:Normal  Bilateral carotid artery disease  EKG 08/25/22 NSR, Normal  Nuclear stress test 07/15/17  Nuclear stress EF: 75%.  There was no ST segment deviation noted during stress.  Defect 1: There is a small defect of moderate severity present in the apical anterior and apex location.  This is a low risk study.  The left ventricular ejection fraction is hyperdynamic (>65%).   Low risk stress nuclear study with mild soft tissue attenuation; no ischemia; EF 75 with normal wall motion.     Neuro/Psych  Headaches PSYCHIATRIC DISORDERS  Depression    Peripheral neuropathy  Neuromuscular disease    GI/Hepatic ,GERD  Medicated,,Hx/o elevated LFT's Internal hemorrhoids   Endo/Other  Obesity Hyperlipidemia  Renal/GU negative Renal ROS  negative genitourinary   Musculoskeletal  (+) Arthritis , Osteoarthritis,    Abdominal  (+) + obese  Peds  Hematology negative hematology ROS (+)   Anesthesia Other Findings   Reproductive/Obstetrics                             Anesthesia Physical Anesthesia Plan  ASA: 3  Anesthesia Plan: General   Post-op Pain Management: Minimal or no pain anticipated and Regional block*   Induction: Intravenous  PONV Risk Score and Plan: 4 or greater and Treatment may vary due to age or medical condition and Ondansetron  Airway Management Planned: Oral ETT  Additional Equipment: None  Intra-op Plan:   Post-operative Plan:  Extubation in OR  Informed Consent: I have reviewed the patients History and Physical, chart, labs and discussed the procedure including the risks, benefits and alternatives for the proposed anesthesia with the patient or authorized representative who has indicated his/her understanding and acceptance.     Dental advisory given  Plan Discussed with: CRNA and Anesthesiologist  Anesthesia Plan Comments:         Anesthesia Quick Evaluation

## 2022-09-18 NOTE — Interval H&P Note (Signed)
History and Physical Interval Note:  09/18/2022 10:04 AM  Pamela Hendricks  has presented today for surgery, with the diagnosis of HEMORRHODIS PROLAPSED GRADE 4.  The various methods of treatment have been discussed with the patient and family. After consideration of risks, benefits and other options for treatment, the patient has consented to  Procedure(s) with comments: ANORECTAL EXAM UNDER ANESTHESIA WITH HEMORRHOIDECTOMY WITH LIGATION AND HEMORRHOIDOPEXY (N/A) - GEN AND LOCAL as a surgical intervention.  The patient's history has been reviewed, patient examined, no change in status, stable for surgery.  I have reviewed the patient's chart and labs.  Questions were answered to the patient's satisfaction.    I have re-reviewed the the patient's records, history, medications, and allergies.  I have re-examined the patient.  I again discussed intraoperative plans and goals of post-operative recovery.  The patient agrees to proceed.  Pamela Hendricks  1945/11/13 [id number]  Patient Care Team: Leeroy Cha, MD as PCP - General (Internal Medicine) Jerline Pain, MD as PCP - Cardiology (Cardiology) Michael Boston, MD as Consulting Physician (General Surgery) Hennie Duos, MD as Consulting Physician (Rheumatology) Debbora Presto, NP as Nurse Practitioner (Neurology) Melrose Nakayama, MD as Consulting Physician (Orthopedic Surgery)  Patient Active Problem List   Diagnosis Date Noted   Pain in both lower extremities 09/16/2021   Essential hypertension 09/16/2021   Mixed hyperlipidemia 09/16/2021   Carotid artery plaque, bilateral 09/16/2021   Aortic atherosclerosis ([place]) 09/16/2021   Chest pain of uncertain etiology 02/40/9735   Chronic daily headache 02/13/2020   Meralgia paresthetica of right side 02/13/2020   Status post operation on paranasal sinus 01/23/2020   Primary osteoarthritis of right knee 10/12/2018   Obstructive sleep apnea treated with continuous positive airway  pressure (CPAP) 01/28/2018   OSA (obstructive sleep apnea) 08/27/2017   Excessive daytime sleepiness 08/27/2017   Paronychia of toe 06/16/2016   Sinusitis, chronic 08/13/2015   Migraine 11/22/2014   Sleep apnea 11/22/2014   Abnormal MRI of head 07/04/2013   Headache(784.0) 03/29/2013   Peripheral neuropathy    Major depressive disorder    Abnormal mammogram 06/10/2012    Past Medical History:  Diagnosis Date   Arthritis    Carotid artery disease ([place])    a. duplex 07/2017 - 1-39% stenosis bilaterally.   Chronic frontal sinusitis    Elevated liver enzymes    per pt   Environmental and seasonal allergies    GERD (gastroesophageal reflux disease)    Hyperlipidemia    Hypertension    Migraines    sinus headaches, no longer having migraines   OSA (obstructive sleep apnea)    CPAP   Peripheral neuropathy    Peripheral vascular disease ([place])    carotid blockage - is followed by Dr. Marlou Porch   Pre-diabetes    Snores    Wears glasses     Past Surgical History:  Procedure Laterality Date   ABDOMINAL HYSTERECTOMY  1970   Total HYST   BREAST BIOPSY Bilateral 2001   benign   BREAST EXCISIONAL BIOPSY Right 2013   sclerosing lesion   BREAST LUMPECTOMY WITH RADIOACTIVE SEED LOCALIZATION Left 08/31/2018   Procedure: BREAST LUMPECTOMY WITH RADIOACTIVE SEED LOCALIZATION;  Surgeon: Fanny Skates, MD;  Location: [facility];  Service: General;  Laterality: Left;   COLONOSCOPY     JOINT REPLACEMENT Left 2001   Left Knee Replacement   KNEE ARTHROSCOPY Left    lt   NASAL SINUS SURGERY     NASAL SINUS SURGERY Bilateral 09/30/2019  Procedure: ENDOSCOPIC SINUS SURGERY;  Surgeon: Jerrell Belfast, MD;  Location: [facility];  Service: ENT;  Laterality: Bilateral;   TOE SURGERY     left foot digit # 2   TOTAL KNEE ARTHROPLASTY Right 10/12/2018   Procedure: TOTAL KNEE ARTHROPLASTY;  Surgeon: Melrose Nakayama, MD;  Location: [facility];  Service: Orthopedics;  Laterality: Right;   WISDOM TOOTH EXTRACTION       Social History   Socioeconomic History   Marital status: Widowed    Spouse name: Laverna Peace   Number of children: 2   Years of education: 11   Highest education level: Not on file  Occupational History   Occupation: retired    Comment: works partime with vendors  Tobacco Use   Smoking status: Never   Smokeless tobacco: Never  Scientific laboratory technician Use: Never used  Substance and Sexual Activity   Alcohol use: No   Drug use: No   Sexual activity: Not Currently  Other Topics Concern   Not on file  Social History Narrative   Patient is widowed. Patient is retired . 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 Patient has a 11th grade education. She has two children.   Social Determinants of Health   Financial Resource Strain: Not on file  Food Insecurity: Not on file  Transportation Needs: Not on file  Physical Activity: Not on file  Stress: Not on file  Social Connections: Not on file  Intimate Partner Violence: Not on file    Family History  Problem Relation Age of Onset   Cancer Mother    Hypertension Mother    Hypertension Father    Heart disease Father        MI around 75, died at 14   [place] Sister        Brain Ca   CAD Daughter        Had bypass in her 106s (type 1 diabetic), died at 23    Medications Prior to Admission  Medication Sig Dispense Refill Last Dose   acetaminophen (TYLENOL) 500 MG tablet Take 1,000 mg by mouth every 6 (six) hours as needed for mild pain or headache.   Past Week   aspirin EC 81 MG tablet Take 81 mg by mouth daily.   09/17/2022   atorvastatin (LIPITOR) 10 MG tablet Take 10 mg by mouth at bedtime.   09/17/2022   benazepril (LOTENSIN) 40 MG tablet Take 40 mg by mouth daily.    09/17/2022   Calcium Carb-Cholecalciferol (CALCIUM 600+D3 PO) Take 1 tablet by mouth 2 (two) times daily.   09/17/2022   celecoxib (CELEBREX) 200 MG capsule Take 200 mg by mouth daily.   09/17/2022   clobetasol ointment  (TEMOVATE) 1.60 % Apply 1 application topically 2 (two) times daily.      estradiol (ESTRACE) 0.1 MG/GM vaginal cream Place 0.5 Applicatorfuls vaginally at bedtime.      fluocinonide cream (LIDEX) 7.37 % Apply 1 Application topically daily.      fluticasone (FLONASE) 50 MCG/ACT nasal spray Place 1 spray into both nostrils daily.   09/17/2022   hydrochlorothiazide (HYDRODIURIL) 25 MG tablet Take 25 mg by mouth daily.   09/17/2022   loratadine (CLARITIN) 10 MG tablet Take 10 mg by mouth daily.   09/17/2022   Multiple Vitamins-Minerals (MULTIVITAMIN WITH MINERALS) tablet Take 1 tablet by mouth daily.   09/17/2022   NON FORMULARY Pt uses a  cpap nightly      nortriptyline (PAMELOR) 25 MG capsule Take 2 capsules (50 mg total) by mouth at bedtime. 180 capsule 4 09/17/2022   oxybutynin (DITROPAN) 5 MG tablet Take 5 mg by mouth 3 (three) times daily.   09/18/2022   oxymetazoline (AFRIN) 0.05 % nasal spray Place 1 spray into both nostrils 2 (two) times daily.      pantoprazole (PROTONIX) 40 MG tablet Take 40 mg by mouth daily.    09/18/2022   Polyethyl Glycol-Propyl Glycol (SYSTANE) 0.4-0.3 % SOLN Place 1 drop into both eyes in the morning.      polyethylene glycol (MIRALAX / GLYCOLAX) 17 g packet Take 17 g by mouth daily.       Current Facility-Administered Medications  Medication Dose Route Frequency Provider Last Rate Last Admin   bupivacaine liposome (EXPAREL) 1.3 % injection 266 mg  20 mL Infiltration Once Michael Boston, MD       cefTRIAXone (ROCEPHIN) 2 g in sodium chloride 0.9 % 100 mL IVPB  2 g Intravenous On Call to OR Michael Boston, MD       Chlorhexidine Gluconate Cloth 2 % PADS 6 each  6 each Topical Once Michael Boston, MD       And   Chlorhexidine Gluconate Cloth 2 % PADS 6 each  6 each Topical Once Michael Boston, MD       lactated ringers infusion   Intravenous Continuous Josephine Igo, MD 10 mL/hr at 09/18/22 0833 New Bag at 09/18/22 4097     Allergies  Allergen Reactions   Other Itching     Watery eyes , itching - Grass, Rag Weed, Mold, Smoke    BP 125/64 (BP Location: Left Arm)   Pulse 85   Temp 98.2 F (36.8 C) (Oral)   Resp 15   Wt 88.9 kg   SpO2 98%   BMI 32.62 kg/m   Labs: Results for orders placed or performed during the hospital encounter of 09/17/22 (from the past 48 hour(s))  Glucose, capillary     Status: None   Collection Time: 09/17/22  8:19 AM  Result Value Ref Range   Glucose-Capillary 80 70 - 99 mg/dL    Comment: Glucose reference range applies only to samples taken after fasting for at least 8 hours.  Hemoglobin A1c per protocol     Status: None   Collection Time: 09/17/22  8:29 AM  Result Value Ref Range   Hgb A1c MFr Bld 5.4 4.8 - 5.6 %    Comment: (NOTE) Pre diabetes:          5.7%-6.4%  Diabetes:              >6.4%  Glycemic control for   <7.0% adults with diabetes    Mean Plasma Glucose 108.28 mg/dL    Comment: Performed at [facility] [address] [place],  [address]  Basic metabolic panel per protocol     Status: Abnormal   Collection Time: 09/17/22  8:29 AM  Result Value Ref Range   Sodium 138 135 - 145 mmol/L   Potassium 3.9 3.5 - 5.1 mmol/L   Chloride 102 98 - 111 mmol/L   CO2 28 22 - 32 mmol/L   Glucose, Bld 90 70 - 99 mg/dL    Comment: Glucose reference range applies only to samples taken after fasting for at least 8 hours.   BUN 24 (H) 8 - 23 mg/dL   Creatinine, Ser 0.67 0.44 - 1.00 mg/dL  Calcium 9.1 8.9 - 10.3 mg/dL   GFR, Estimated >60 >60 mL/min    Comment: (NOTE) Calculated using the CKD-EPI Creatinine Equation (2021)    Anion gap 8 5 - 15    Comment: Performed at [facility], [place] [address], [place], [place] [address]  CBC per protocol     Status: None   Collection Time: 09/17/22  8:29 AM  Result Value Ref Range   WBC 7.3 4.0 - 10.5 K/uL   RBC 3.90 3.87 - 5.11 MIL/uL   Hemoglobin 12.3 12.0 - 15.0 g/dL   HCT 38.2 36.0 - 46.0 %   MCV 97.9 80.0 - 100.0 fL   MCH 31.5  26.0 - 34.0 pg   MCHC 32.2 30.0 - 36.0 g/dL   RDW 12.7 11.5 - 15.5 %   Platelets 266 150 - 400 K/uL   nRBC 0.0 0.0 - 0.2 %    Comment: Performed at [facility], [place] [address], [place], [place] [address]    Imaging / Studies: No results found.   Adin Hector, M.D., F.A.C.S. Gastrointestinal and Minimally Invasive Surgery [facility] [address], [place], [place] [address] [phone] Main / Paging  09/18/2022 10:04 AM    Adin Hector

## 2022-09-18 NOTE — Transfer of Care (Signed)
Immediate Anesthesia Transfer of Care Note  Patient: Pamela Hendricks  Procedure(s) Performed: ANORECTAL EXAM UNDER ANESTHESIA WITH HEMORRHOIDECTOMY WITH LIGATION AND HEMORRHOIDOPEXY X3 (Rectum)  Patient Location: PACU  Anesthesia Type:General  Level of Consciousness: awake, alert , and oriented  Airway & Oxygen Therapy: Patient Spontanous Breathing and Patient connected to face mask oxygen  Post-op Assessment: Report given to RN and Post -op Vital signs reviewed and stable  Post vital signs: Reviewed and stable  Last Vitals:  Vitals Value Taken Time  BP 169/90 09/18/22 1210  Temp    Pulse 101 09/18/22 1213  Resp 12 09/18/22 1213  SpO2 99 % 09/18/22 1213  Vitals shown include unvalidated device data.  Last Pain:  Vitals:   09/18/22 0843  TempSrc:   PainSc: 0-No pain         Complications:  Encounter Notable Events  Notable Event Outcome Phase Comment  Difficult to intubate - expected  Intraprocedure Filed from anesthesia note documentation.

## 2022-09-18 NOTE — Anesthesia Procedure Notes (Addendum)
Procedure Name: Intubation Date/Time: 09/18/2022 10:45 AM  Performed by: Maxwell Caul, CRNAPre-anesthesia Checklist: Patient identified, Emergency Drugs available, Suction available and Patient being monitored Patient Re-evaluated:Patient Re-evaluated prior to induction Oxygen Delivery Method: Circle system utilized Preoxygenation: Pre-oxygenation with 100% oxygen Induction Type: IV induction Ventilation: Mask ventilation without difficulty Laryngoscope Size: Glidescope and 3 Grade View: Grade III Tube type: Oral Number of attempts: 2 Airway Equipment and Method: Stylet Placement Confirmation: ETT inserted through vocal cords under direct vision, positive ETCO2 and breath sounds checked- equal and bilateral Secured at: 21 cm Tube secured with: Tape Dental Injury: Injury to lip  Difficulty Due To: Difficulty was anticipated, Difficult Airway- due to anterior larynx and Difficult Airway- due to limited oral opening Comments: DL with MAC 4 with poor view. Glidescope 3 with Grade 1 view and ETT gently placed. Small nick noted in upper lip after intubated.

## 2022-09-19 ENCOUNTER — Encounter (HOSPITAL_COMMUNITY): Payer: Self-pay | Admitting: Surgery

## 2022-09-19 ENCOUNTER — Other Ambulatory Visit: Payer: Self-pay | Admitting: Internal Medicine

## 2022-09-19 DIAGNOSIS — R928 Other abnormal and inconclusive findings on diagnostic imaging of breast: Secondary | ICD-10-CM

## 2022-09-19 LAB — SURGICAL PATHOLOGY

## 2022-09-26 ENCOUNTER — Other Ambulatory Visit: Payer: Self-pay | Admitting: Neurology

## 2022-10-06 ENCOUNTER — Ambulatory Visit
Admission: RE | Admit: 2022-10-06 | Discharge: 2022-10-06 | Disposition: A | Discharge status: Home / Self Care | Payer: Medicare Other | Source: Ambulatory Visit | Attending: Internal Medicine | Admitting: Internal Medicine

## 2022-10-06 DIAGNOSIS — Z853 Personal history of malignant neoplasm of breast: Secondary | ICD-10-CM | POA: Diagnosis not present

## 2022-10-06 DIAGNOSIS — R928 Other abnormal and inconclusive findings on diagnostic imaging of breast: Secondary | ICD-10-CM

## 2022-10-07 ENCOUNTER — Other Ambulatory Visit: Payer: Self-pay | Admitting: Internal Medicine

## 2022-10-07 DIAGNOSIS — R921 Mammographic calcification found on diagnostic imaging of breast: Secondary | ICD-10-CM

## 2022-10-13 ENCOUNTER — Ambulatory Visit
Admission: RE | Admit: 2022-10-13 | Discharge: 2022-10-13 | Disposition: A | Discharge status: Home / Self Care | Payer: Medicare Other | Source: Ambulatory Visit | Attending: Internal Medicine | Admitting: Internal Medicine

## 2022-10-13 DIAGNOSIS — R921 Mammographic calcification found on diagnostic imaging of breast: Secondary | ICD-10-CM

## 2022-10-13 HISTORY — PX: BREAST BIOPSY: SHX20

## 2022-10-15 ENCOUNTER — Telehealth: Payer: Self-pay | Admitting: Hematology and Oncology

## 2022-10-15 ENCOUNTER — Other Ambulatory Visit: Payer: Self-pay | Admitting: Internal Medicine

## 2022-10-15 DIAGNOSIS — D0511 Intraductal carcinoma in situ of right breast: Secondary | ICD-10-CM

## 2022-10-15 NOTE — Telephone Encounter (Signed)
Spoke to patient to confirm upcoming afternoon [facility] clinic appointment on 12/13, paperwork will be sent via mail.   Gave location and time, also informed patient that the surgeon's office would be calling as well to get information from them similar to the packet that they will be receiving so make sure to do both.  Reminded patient that all providers will be coming to the clinic to see them HERE and if they had any questions to not hesitate to reach back out to myself or their navigators.

## 2022-10-16 ENCOUNTER — Ambulatory Visit
Admission: RE | Admit: 2022-10-16 | Discharge: 2022-10-16 | Disposition: A | Discharge status: Home / Self Care | Payer: Medicare Other | Source: Ambulatory Visit | Attending: Internal Medicine | Admitting: Internal Medicine

## 2022-10-16 DIAGNOSIS — D0511 Intraductal carcinoma in situ of right breast: Secondary | ICD-10-CM

## 2022-10-16 DIAGNOSIS — Z0389 Encounter for observation for other suspected diseases and conditions ruled out: Secondary | ICD-10-CM | POA: Diagnosis not present

## 2022-10-20 ENCOUNTER — Encounter: Payer: Self-pay | Admitting: *Deleted

## 2022-10-20 DIAGNOSIS — Z170 Estrogen receptor positive status [ER+]: Secondary | ICD-10-CM

## 2022-10-21 NOTE — Progress Notes (Signed)
Radiation Oncology         [phone] ________________________________  Initial Outpatient Consultation  Name: Pamela Hendricks MRN: [id number]  Date: 10/22/2022  DOB: [birth year]  IR:[id number], Ronie Spies, MD  Jovita Kussmaul, MD   REFERRING PHYSICIAN: Autumn Messing III, MD  DIAGNOSIS: No diagnosis found.   Cancer Staging  No matching staging information was found for the patient.  Stage *** Right Breast UOQ, Invasive Ductal Carcinoma with intermediate to high-grade DCIS, ER+ / PR+ / Her2-, Grade 2  CHIEF COMPLAINT: Here to discuss management of right breast cancer  HISTORY OF PRESENT ILLNESS::Pamela Hendricks is a 76 y.o. female who presented with a right breast abnormality on the following imaging: bilateral screening mammogram on the date of 09/17/22.  No symptoms, if any, were reported at that time. Diagnostic right breast mammogram on 10/06/22 revealed 2 groups of faint punctate calcifications in the upper outer and retroareolar right breast. The larger group in the upper outer right breast spans 1.7 cm, and the smaller group more anteriorly in the retroareolar aspect spans 0.3 cm.   Biopsy of the upper outer right breast calcifications on date of 10/06/22 showed grade 2 invasive ductal carcinoma measuring 1 mm in the greatest linear extent of the sample, with associated intermediate to high-grade DCIS and microcalcifications.  ER status: 100% positive with strong staining intensity; PR status 95% positive with moderate-strong staining intensity; Proliferation marker Ki67 at 10%; Her2 status negative; Grade 2.  Biopsy of the lateral retroareolar right breast calcifications also performed revealed benign findings included stromal fibrosis, cystic dilation of the ducts, adenosis, apocrine metaplasia, and usual duct hyperplasia.   (No lymph nodes were examined).   Ultrasound of the right axilla on 10/16/22 showed no evidence of right axillary adenopathy.   ***  PREVIOUS RADIATION  THERAPY: No  PAST MEDICAL HISTORY:  has a past medical history of Arthritis, Carotid artery disease ([place]), Chronic frontal sinusitis, Elevated liver enzymes, Environmental and seasonal allergies, GERD (gastroesophageal reflux disease), Hyperlipidemia, Hypertension, Migraines, OSA (obstructive sleep apnea), Peripheral neuropathy, Peripheral vascular disease ([place]), Pre-diabetes, Snores, and Wears glasses.    PAST SURGICAL HISTORY: Past Surgical History:  Procedure Laterality Date   ABDOMINAL HYSTERECTOMY  1970   Total HYST   BREAST BIOPSY Bilateral 2001   benign   BREAST BIOPSY Right 10/13/2022   MM RT BREAST BX W LOC DEV 1ST LESION IMAGE BX SPEC STEREO GUIDE 10/13/2022 [facility]   BREAST BIOPSY Right 10/13/2022   MM RT BREAST BX W LOC DEV EA AD LESION IMG BX SPEC STEREO GUIDE 10/13/2022 [facility]   BREAST EXCISIONAL BIOPSY Right 2013   sclerosing lesion   BREAST LUMPECTOMY WITH RADIOACTIVE SEED LOCALIZATION Left 08/31/2018   Procedure: BREAST LUMPECTOMY WITH RADIOACTIVE SEED LOCALIZATION;  Surgeon: Fanny Skates, MD;  Location: [place];  Service: General;  Laterality: Left;   COLONOSCOPY     EVALUATION UNDER ANESTHESIA WITH HEMORRHOIDECTOMY N/A 09/18/2022   Procedure: ANORECTAL EXAM UNDER ANESTHESIA WITH HEMORRHOIDECTOMY WITH LIGATION AND HEMORRHOIDOPEXY X3;  Surgeon: Michael Boston, MD;  Location: [facility];  Service: General;  Laterality: N/A;  GEN AND LOCAL   JOINT REPLACEMENT Left 2001   Left Knee Replacement   KNEE ARTHROSCOPY Left    lt   NASAL SINUS SURGERY     NASAL SINUS SURGERY Bilateral 09/30/2019   Procedure: ENDOSCOPIC SINUS SURGERY;  Surgeon: Jerrell Belfast, MD;  Location: [place];  Service: ENT;  Laterality: Bilateral;   TOE SURGERY     left foot  digit # 2   TOTAL KNEE ARTHROPLASTY Right 10/12/2018   Procedure: TOTAL KNEE ARTHROPLASTY;  Surgeon: Melrose Nakayama, MD;  Location: [place];  Service: Orthopedics;  Laterality: Right;   WISDOM TOOTH EXTRACTION       FAMILY HISTORY: family history includes CAD in her daughter; Cancer in her brother, mother, and sister; Heart disease in her father; Hypertension in her father and mother.  SOCIAL HISTORY:  reports that she has never smoked. She has never used smokeless tobacco. She reports that she does not drink alcohol and does not use drugs.  ALLERGIES: Other  MEDICATIONS:  Current Outpatient Medications  Medication Sig Dispense Refill   acetaminophen (TYLENOL) 500 MG tablet Take 1,000 mg by mouth every 6 (six) hours as needed for mild pain or headache.     aspirin EC 81 MG tablet Take 81 mg by mouth daily.     atorvastatin (LIPITOR) 10 MG tablet Take 10 mg by mouth at bedtime.     benazepril (LOTENSIN) 40 MG tablet Take 40 mg by mouth daily.      Calcium Carb-Cholecalciferol (CALCIUM 600+D3 PO) Take 1 tablet by mouth 2 (two) times daily.     celecoxib (CELEBREX) 200 MG capsule Take 200 mg by mouth daily.     clobetasol ointment (TEMOVATE) 1.97 % Apply 1 application topically 2 (two) times daily.     estradiol (ESTRACE) 0.1 MG/GM vaginal cream Place 0.5 Applicatorfuls vaginally at bedtime.     fluocinonide cream (LIDEX) 5.88 % Apply 1 Application topically daily.     fluticasone (FLONASE) 50 MCG/ACT nasal spray Place 1 spray into both nostrils daily.     hydrochlorothiazide (HYDRODIURIL) 25 MG tablet Take 25 mg by mouth daily.     loratadine (CLARITIN) 10 MG tablet Take 10 mg by mouth daily.     Multiple Vitamins-Minerals (MULTIVITAMIN WITH MINERALS) tablet Take 1 tablet by mouth daily.     NON FORMULARY Pt uses a cpap nightly     nortriptyline (PAMELOR) 25 MG capsule TAKE 2 CAPSULES(50 MG) BY MOUTH AT BEDTIME 180 capsule 4   oxybutynin (DITROPAN) 5 MG tablet Take 5 mg by mouth 3 (three) times daily.     oxyCODONE (OXY IR/ROXICODONE) 5 MG immediate release tablet Take 1 tablet (5 mg total) by mouth every 6 (six) hours as needed for severe pain or breakthrough pain. 30 tablet 0   oxymetazoline  (AFRIN) 0.05 % nasal spray Place 1 spray into both nostrils 2 (two) times daily.     pantoprazole (PROTONIX) 40 MG tablet Take 40 mg by mouth daily.      Polyethyl Glycol-Propyl Glycol (SYSTANE) 0.4-0.3 % SOLN Place 1 drop into both eyes in the morning.     polyethylene glycol (MIRALAX / GLYCOLAX) 17 g packet Take 17 g by mouth daily.     No current facility-administered medications for this encounter.    REVIEW OF SYSTEMS: As above in HPI.   PHYSICAL EXAM:  vitals were not taken for this visit.   General: Alert and oriented, in no acute distress HEENT: Head is normocephalic. Extraocular movements are intact. Oropharynx is clear. Neck: Neck is supple, no palpable cervical or supraclavicular lymphadenopathy. Heart: Regular in rate and rhythm with no murmurs, rubs, or gallops. Chest: Clear to auscultation bilaterally, with no rhonchi, wheezes, or rales. Abdomen: Soft, nontender, nondistended, with no rigidity or guarding. Extremities: No cyanosis or edema. Lymphatics: see Neck Exam Skin: No concerning lesions. Musculoskeletal: symmetric strength and muscle tone throughout. Neurologic: Cranial nerves II  through XII are grossly intact. No obvious focalities. Speech is fluent. Coordination is intact. Psychiatric: Judgment and insight are intact. Affect is appropriate. Breasts: *** . No other palpable masses appreciated in the breasts or axillae *** .    ECOG = ***  0 - Asymptomatic (Fully active, able to carry on all predisease activities without restriction)  1 - Symptomatic but completely ambulatory (Restricted in physically strenuous activity but ambulatory and able to carry out work of a light or sedentary nature. For example, light housework, office work)  2 - Symptomatic, <50% in bed during the day (Ambulatory and capable of all self care but unable to carry out any work activities. Up and about more than 50% of waking hours)  3 - Symptomatic, >50% in bed, but not bedbound  (Capable of only limited self-care, confined to bed or chair 50% or more of waking hours)  4 - Bedbound (Completely disabled. Cannot carry on any self-care. Totally confined to bed or chair)  5 - Death   Eustace Pen MM, Creech RH, Tormey DC, et al. [phone]). "Toxicity and response criteria of the [facility]". [place] Oncol. 5 (6): 649-55   LABORATORY DATA:  Lab Results  Component Value Date   WBC 7.3 09/17/2022   HGB 12.3 09/17/2022   HCT 38.2 09/17/2022   MCV 97.9 09/17/2022   PLT 266 09/17/2022   CMP     Component Value Date/Time   NA 138 09/17/2022 0829   NA 139 11/22/2014 1050   K 3.9 09/17/2022 0829   CL 102 09/17/2022 0829   CO2 28 09/17/2022 0829   GLUCOSE 90 09/17/2022 0829   BUN 24 (H) 09/17/2022 0829   BUN 11 11/22/2014 1050   CREATININE 0.67 09/17/2022 0829   CALCIUM 9.1 09/17/2022 0829   PROT 6.9 09/27/2019 0920   PROT 6.1 12/14/2017 1145   ALBUMIN 3.6 09/27/2019 0920   ALBUMIN 4.0 12/14/2017 1145   AST 41 09/27/2019 0920   ALT 59 (H) 01/16/2022 0916   ALKPHOS 69 09/27/2019 0920   BILITOT 0.6 09/27/2019 0920   BILITOT 0.3 12/14/2017 1145   GFRNONAA >60 09/17/2022 0829   GFRAA >60 09/27/2019 0920         RADIOGRAPHY: MM RT BREAST BX W LOC DEV 1ST LESION IMAGE BX SPEC STEREO GUIDE  Addendum Date: 10/16/2022   ADDENDUM REPORT: 10/16/2022 10:43 ADDENDUM: Pathology revealed GRADE 2 INVASIVE MODERATELY DIFFERENTIATED DUCTAL CARCINOMA, DUCTAL CARCINOMA IN SITU, INTERMEDIATE TO HIGH NUCLEAR GRADE, SOLID AND CRIBRIFORM TYPES WITH FOCAL NECROSIS,MICROCALCIFICATIONS PRESENT WITHIN DCIS AND FIBROCYSTIC CHANGES of the RIGHT breast, upper outer quadrant (X clip). This was found to be concordant by Dr. Claudie Revering. Pathology revealed BENIGN BREAST WITH FIBROCYSTIC CHANGES INCLUDING STROMAL FIBROSIS, CYSTIC DILATATION OF DUCTS, ADENOSIS, APOCRINE METAPLASIA AND USUAL DUCT HYPERPLASIA, FRAGMENTS OF CYST WALL, MICROCALCIFICATIONS PRESENT of the RIGHT  breast, retroareolar (coil clip). This was found to be concordant by Dr. Claudie Revering. Pathology results were discussed with the patient by telephone. The patient reported doing well after the biopsies with tenderness at the sites. Post biopsy instructions and care were reviewed and questions were answered. The patient was encouraged to call [facility] for any additional concerns. The patient was referred to [facility] on October 22, 2022, per patient request. The patient was instructed to return for RIGHT axillary ultrasound October 16, 2022 @ [place]. Pathology results reported  by Stacie Acres RN on 10/16/2022. Electronically Signed   By: Claudie Revering M.D.   On: 10/16/2022 10:43   Result Date: 10/16/2022 CLINICAL DATA:  1.7 cm group of indeterminate calcifications in the upper outer right breast and 0.3 cm group of indeterminate calcifications in the outer retroareolar right breast at recent mammography. Status post right breast excision for a complex sclerosing lesion in 2013. History of left breast complex sclerosing lesion resection in 2019. EXAM: RIGHT BREAST STEREOTACTIC CORE NEEDLE BIOPSY X 2 COMPARISON:  Previous exam(s). FINDINGS: The patient and I discussed the procedure of stereotactic-guided biopsy including benefits and alternatives. We discussed the high likelihood of successful procedures. We discussed the risks of the procedures including infection, bleeding, tissue injury, clip migration, and inadequate sampling. Informed written consent was given. The usual time out protocol was performed immediately prior to the procedures. SITE 1: 1.7 CM GROUP OF CALCIFICATIONS IN THE UPPER-OUTER RIGHT BREAST Using sterile technique and 1% Lidocaine as local anesthetic, under stereotactic guidance, a 9 gauge vacuum assisted device was used to perform core needle biopsy of the  1.7 cm group of calcifications in the upper-outer right breast using a cephalad approach. Specimen radiograph was performed showing calcifications in the specimens. Specimens with calcifications are identified for pathology. Lesion quadrant: Upper outer quadrant SITE 2: 0.3 CM GROUP OF CALCIFICATIONS IN THE OUTER RETROAREOLAR RIGHT BREAST Using sterile technique and 1% Lidocaine as local anesthetic, under stereotactic guidance, a 9 gauge vacuum assisted device was used to perform core needle biopsy of the recently demonstrated 0.3 cm group of calcifications in the outer retroareolar right breast using a cephalad approach. Specimen radiograph was performed showing calcifications in the specimens. Specimens with calcifications are identified for pathology. At the conclusion of the procedure, a coil shaped tissue marker clip was deployed into the biopsy cavity. Follow-up 2-view mammogram was performed and dictated separately. IMPRESSION: Stereotactic-guided biopsy of a 1.7 cm group of calcifications in the upper-outer right breast and a 0.3 cm group of calcifications in the outer retroareolar right breast. No apparent complications. Electronically Signed: By: Claudie Revering M.D. On: 10/13/2022 16:45  MM RT BREAST BX W LOC DEV EA AD LESION IMG BX SPEC STEREO GUIDE  Addendum Date: 10/16/2022   ADDENDUM REPORT: 10/16/2022 10:43 ADDENDUM: Pathology revealed GRADE 2 INVASIVE MODERATELY DIFFERENTIATED DUCTAL CARCINOMA, DUCTAL CARCINOMA IN SITU, INTERMEDIATE TO HIGH NUCLEAR GRADE, SOLID AND CRIBRIFORM TYPES WITH FOCAL NECROSIS,MICROCALCIFICATIONS PRESENT WITHIN DCIS AND FIBROCYSTIC CHANGES of the RIGHT breast, upper outer quadrant (X clip). This was found to be concordant by Dr. Claudie Revering. Pathology revealed BENIGN BREAST WITH FIBROCYSTIC CHANGES INCLUDING STROMAL FIBROSIS, CYSTIC DILATATION OF DUCTS, ADENOSIS, APOCRINE METAPLASIA AND USUAL DUCT HYPERPLASIA, FRAGMENTS OF CYST WALL, MICROCALCIFICATIONS PRESENT of the RIGHT  breast, retroareolar (coil clip). This was found to be concordant by Dr. Claudie Revering. Pathology results were discussed with the patient by telephone. The patient reported doing well after the biopsies with tenderness at the sites. Post biopsy instructions and care were reviewed and questions were answered. The patient was encouraged to call [facility] [place] for any additional concerns. The patient was referred to [facility] on October 22, 2022, per patient request. The patient was instructed to return for RIGHT axillary ultrasound October 16, 2022 @ [place]. Pathology results reported by Stacie Acres RN on 10/16/2022. Electronically Signed   By: Claudie Revering M.D.   On: 10/16/2022  10:43   Result Date: 10/16/2022 CLINICAL DATA:  1.7 cm group of indeterminate calcifications in the upper outer right breast and 0.3 cm group of indeterminate calcifications in the outer retroareolar right breast at recent mammography. Status post right breast excision for a complex sclerosing lesion in 2013. History of left breast complex sclerosing lesion resection in 2019. EXAM: RIGHT BREAST STEREOTACTIC CORE NEEDLE BIOPSY X 2 COMPARISON:  Previous exam(s). FINDINGS: The patient and I discussed the procedure of stereotactic-guided biopsy including benefits and alternatives. We discussed the high likelihood of successful procedures. We discussed the risks of the procedures including infection, bleeding, tissue injury, clip migration, and inadequate sampling. Informed written consent was given. The usual time out protocol was performed immediately prior to the procedures. SITE 1: 1.7 CM GROUP OF CALCIFICATIONS IN THE UPPER-OUTER RIGHT BREAST Using sterile technique and 1% Lidocaine as local anesthetic, under stereotactic guidance, a 9 gauge vacuum assisted device was used to perform core needle biopsy of the  1.7 cm group of calcifications in the upper-outer right breast using a cephalad approach. Specimen radiograph was performed showing calcifications in the specimens. Specimens with calcifications are identified for pathology. Lesion quadrant: Upper outer quadrant SITE 2: 0.3 CM GROUP OF CALCIFICATIONS IN THE OUTER RETROAREOLAR RIGHT BREAST Using sterile technique and 1% Lidocaine as local anesthetic, under stereotactic guidance, a 9 gauge vacuum assisted device was used to perform core needle biopsy of the recently demonstrated 0.3 cm group of calcifications in the outer retroareolar right breast using a cephalad approach. Specimen radiograph was performed showing calcifications in the specimens. Specimens with calcifications are identified for pathology. At the conclusion of the procedure, a coil shaped tissue marker clip was deployed into the biopsy cavity. Follow-up 2-view mammogram was performed and dictated separately. IMPRESSION: Stereotactic-guided biopsy of a 1.7 cm group of calcifications in the upper-outer right breast and a 0.3 cm group of calcifications in the outer retroareolar right breast. No apparent complications. Electronically Signed: By: Claudie Revering M.D. On: 10/13/2022 16:45  [place] AXILLA RIGHT  Result Date: 10/16/2022 CLINICAL DATA:  Patient returns for evaluation of the RIGHT axilla. Recent stereotactic biopsy of calcifications in the RIGHT breast shows invasive moderately differentiated ductal carcinoma and ductal carcinoma in situ. A second biopsy shows benign concordant fibrocystic changes. EXAM: ULTRASOUND OF THE RIGHT AXILLA COMPARISON:  None available. FINDINGS: On physical exam,patient has healing of biopsy sites in the UPPER-OUTER QUADRANT of the RIGHT breast. There is minimal residual ecchymosis. Ultrasound is performed, showing normal appearing RIGHT axillary lymph nodes. No axillary mass or abnormal lymph nodes are identified. IMPRESSION: No RIGHT axillary adenopathy.  RECOMMENDATION: Treatment plan for known RIGHT breast malignancy I have discussed the findings and recommendations with the patient. If applicable, a reminder letter will be sent to the patient regarding the next appointment. BI-RADS CATEGORY  1: Negative. Electronically Signed   By: Nolon Nations M.D.   On: 10/16/2022 09:00  MM CLIP PLACEMENT RIGHT  Result Date: 10/13/2022 CLINICAL DATA:  Status post ultrasound-guided core needle biopsy of status post 3D stereotactic guided core needle biopsy of a 1.7 cm group of calcifications in the upper-outer right breast and a 0.3 cm group of calcifications in the outer retroareolar right breast. EXAM: 3D DIAGNOSTIC RIGHT MAMMOGRAM POST STEREOTACTIC BIOPSY X 2 COMPARISON:  Previous exam(s). FINDINGS: 3D Mammographic images were obtained following 3D stereotactic guided biopsy of a 1.7 cm group of calcifications in the upper-outer right breast and a 0.3 cm group of calcifications in the outer retroareolar  right breast. The biopsy marking clips are in expected positions at the sites of biopsy. IMPRESSION: Appropriate positioning of the X shaped biopsy marking clip at the site of biopsy in the upper-outer right breast and appropriate positioning of the coil shaped biopsy marker clip in the outer retroareolar right breast. The clips are located 3.1 cm apart. Final Assessment: Post Procedure Mammograms for Marker Placement Electronically Signed   By: Claudie Revering M.D.   On: 10/13/2022 16:46  MM Digital Diagnostic Unilat R  Result Date: 10/06/2022 CLINICAL DATA:  76 year old female presenting as a recall from screening for possible right breast calcifications. Patient has a history of right breast cancer status post lumpectomy in 2013. Patient has a history of left breast complex sclerosing lesion. EXAM: DIGITAL DIAGNOSTIC UNILATERAL RIGHT MAMMOGRAM TECHNIQUE: Right digital diagnostic mammography was performed. COMPARISON:  Previous exam(s). ACR Breast Density Category  b: There are scattered areas of fibroglandular density. FINDINGS: Spot 2D magnification views and full true lateral tomosynthesis views of the right breast were performed demonstrating persistence of 2 groups of faint punctate calcifications in the upper outer and retroareolar right breast. The larger group in the upper outer right breast spans 1.7 cm. The smaller group more anteriorly in the retroareolar aspect span 0.3 cm. IMPRESSION: Indeterminate 2 groups of calcifications in the upper outer and retroareolar right breast. RECOMMENDATION: Stereotactic core needle biopsy x2 of the right breast. I have discussed the findings and recommendations with the patient who agrees to proceed with biopsy. The patient will be scheduled for the biopsy appointment prior to leaving the office today. BI-RADS CATEGORY  4: Suspicious. Electronically Signed   By: Audie Pinto M.D.   On: 10/06/2022 09:23     IMPRESSION/PLAN: ***   It was a pleasure meeting the patient today. We discussed the risks, benefits, and side effects of radiotherapy. I recommend radiotherapy to the *** to reduce her risk of locoregional recurrence by 2/3.  We discussed that radiation would take approximately *** weeks to complete and that I would give the patient a few weeks to heal following surgery before starting treatment planning. *** If chemotherapy were to be given, this would precede radiotherapy. We spoke about acute effects including skin irritation and fatigue as well as much less common late effects including internal organ injury or irritation. We spoke about the latest technology that is used to minimize the risk of late effects for patients undergoing radiotherapy to the breast or chest wall. No guarantees of treatment were given. The patient is enthusiastic about proceeding with treatment. I look forward to participating in the patient's care.  I will await her referral back to me for postoperative follow-up and eventual CT  simulation/treatment planning.  On date of service, in total, I spent *** minutes on this encounter. Patient was seen in person.   __________________________________________   Eppie Gibson, MD  This document serves as a record of services personally performed by Eppie Gibson, MD. It was created on her behalf by Roney Mans, a trained medical scribe. The creation of this record is based on the scribe's personal observations and the provider's statements to them. This document has been checked and approved by the attending provider.

## 2022-10-22 ENCOUNTER — Ambulatory Visit: Payer: Medicare Other | Attending: General Surgery | Admitting: Physical Therapy

## 2022-10-22 ENCOUNTER — Encounter: Payer: Self-pay | Admitting: Physical Therapy

## 2022-10-22 ENCOUNTER — Encounter: Payer: Self-pay | Admitting: Radiation Oncology

## 2022-10-22 ENCOUNTER — Inpatient Hospital Stay: Payer: Medicare Other | Attending: Hematology and Oncology

## 2022-10-22 ENCOUNTER — Ambulatory Visit: Payer: Self-pay | Admitting: General Surgery

## 2022-10-22 ENCOUNTER — Ambulatory Visit
Admission: RE | Admit: 2022-10-22 | Discharge: 2022-10-22 | Disposition: A | Discharge status: Home / Self Care | Payer: Medicare Other | Source: Ambulatory Visit | Attending: Radiation Oncology | Admitting: Radiation Oncology

## 2022-10-22 ENCOUNTER — Encounter: Payer: Self-pay | Admitting: Hematology and Oncology

## 2022-10-22 ENCOUNTER — Other Ambulatory Visit: Payer: Self-pay

## 2022-10-22 ENCOUNTER — Inpatient Hospital Stay (HOSPITAL_BASED_OUTPATIENT_CLINIC_OR_DEPARTMENT_OTHER): Payer: Medicare Other | Admitting: Hematology and Oncology

## 2022-10-22 VITALS — BP 148/65 | HR 98 | Temp 97.5°F | Resp 18 | Ht 65.0 in | Wt 198.0 lb

## 2022-10-22 DIAGNOSIS — I2510 Atherosclerotic heart disease of native coronary artery without angina pectoris: Secondary | ICD-10-CM | POA: Insufficient documentation

## 2022-10-22 DIAGNOSIS — I10 Essential (primary) hypertension: Secondary | ICD-10-CM | POA: Diagnosis not present

## 2022-10-22 DIAGNOSIS — Z170 Estrogen receptor positive status [ER+]: Secondary | ICD-10-CM | POA: Insufficient documentation

## 2022-10-22 DIAGNOSIS — C50411 Malignant neoplasm of upper-outer quadrant of right female breast: Secondary | ICD-10-CM

## 2022-10-22 DIAGNOSIS — M1990 Unspecified osteoarthritis, unspecified site: Secondary | ICD-10-CM | POA: Insufficient documentation

## 2022-10-22 DIAGNOSIS — G4733 Obstructive sleep apnea (adult) (pediatric): Secondary | ICD-10-CM | POA: Insufficient documentation

## 2022-10-22 DIAGNOSIS — Z7982 Long term (current) use of aspirin: Secondary | ICD-10-CM | POA: Insufficient documentation

## 2022-10-22 DIAGNOSIS — I739 Peripheral vascular disease, unspecified: Secondary | ICD-10-CM | POA: Insufficient documentation

## 2022-10-22 DIAGNOSIS — Z791 Long term (current) use of non-steroidal anti-inflammatories (NSAID): Secondary | ICD-10-CM | POA: Insufficient documentation

## 2022-10-22 DIAGNOSIS — K219 Gastro-esophageal reflux disease without esophagitis: Secondary | ICD-10-CM | POA: Diagnosis not present

## 2022-10-22 DIAGNOSIS — Z79899 Other long term (current) drug therapy: Secondary | ICD-10-CM | POA: Diagnosis not present

## 2022-10-22 DIAGNOSIS — R293 Abnormal posture: Secondary | ICD-10-CM | POA: Diagnosis not present

## 2022-10-22 LAB — CBC WITH DIFFERENTIAL (CANCER CENTER ONLY)
Abs Immature Granulocytes: 0.03 10*3/uL (ref 0.00–0.07)
Basophils Absolute: 0.1 10*3/uL (ref 0.0–0.1)
Basophils Relative: 1 %
Eosinophils Absolute: 0.3 10*3/uL (ref 0.0–0.5)
Eosinophils Relative: 3 %
HCT: 37.5 % (ref 36.0–46.0)
Hemoglobin: 12.4 g/dL (ref 12.0–15.0)
Immature Granulocytes: 0 %
Lymphocytes Relative: 15 %
Lymphs Abs: 1.6 10*3/uL (ref 0.7–4.0)
MCH: 31.3 pg (ref 26.0–34.0)
MCHC: 33.1 g/dL (ref 30.0–36.0)
MCV: 94.7 fL (ref 80.0–100.0)
Monocytes Absolute: 0.9 10*3/uL (ref 0.1–1.0)
Monocytes Relative: 8 %
Neutro Abs: 7.9 10*3/uL — ABNORMAL HIGH (ref 1.7–7.7)
Neutrophils Relative %: 73 %
Platelet Count: 267 10*3/uL (ref 150–400)
RBC: 3.96 MIL/uL (ref 3.87–5.11)
RDW: 12.7 % (ref 11.5–15.5)
WBC Count: 10.8 10*3/uL — ABNORMAL HIGH (ref 4.0–10.5)
nRBC: 0 % (ref 0.0–0.2)

## 2022-10-22 LAB — CMP (CANCER CENTER ONLY)
ALT: 29 U/L (ref 0–44)
AST: 26 U/L (ref 15–41)
Albumin: 4.3 g/dL (ref 3.5–5.0)
Alkaline Phosphatase: 64 U/L (ref 38–126)
Anion gap: 8 (ref 5–15)
BUN: 15 mg/dL (ref 8–23)
CO2: 30 mmol/L (ref 22–32)
Calcium: 10 mg/dL (ref 8.9–10.3)
Chloride: 101 mmol/L (ref 98–111)
Creatinine: 0.67 mg/dL (ref 0.44–1.00)
GFR, Estimated: >60 mL/min (ref >60)
Glucose, Bld: 93 mg/dL (ref 70–99)
Potassium: 3.8 mmol/L (ref 3.5–5.1)
Sodium: 139 mmol/L (ref 135–145)
Total Bilirubin: 0.5 mg/dL (ref 0.3–1.2)
Total Protein: 6.9 g/dL (ref 6.5–8.1)

## 2022-10-22 LAB — GENETIC SCREENING ORDER

## 2022-10-22 MED ORDER — KETOROLAC TROMETHAMINE 15 MG/ML IJ SOLN: 15.0000 mg | Freq: Once | INTRAMUSCULAR | Status: AC

## 2022-10-22 NOTE — Assessment & Plan Note (Signed)
10/13/2022:Screening mammogram detected right breast calcifications 1.7 cm span (history of CSL), additional 0.3 cm retroareolar calcifications within biopsy were benign and concordant.  Biopsy: Grade 2 IDC with DCIS ER 100%, PR 95%, HER2 1+ negative, Ki-67 10%  Pathology and radiology counseling: Discussed with the patient, the details of pathology including the type of breast cancer,the clinical staging, the significance of ER, PR and HER-2/neu receptors and the implications for treatment. After reviewing the pathology in detail, we proceeded to discuss the different treatment options between surgery, radiation, chemotherapy, antiestrogen therapies.  Treatment plan: Breast conserving surgery with sentinel lymph node biopsy +/- Adjuvant radiation Adjuvant antiestrogen therapy  Return to clinic after surgery to discuss final pathology report

## 2022-10-22 NOTE — Progress Notes (Signed)
[place] NOTE  Patient Care Team: Leeroy Cha, MD as PCP - General (Internal Medicine) Jerline Pain, MD as PCP - Cardiology (Cardiology) Michael Boston, MD as Consulting Physician (General Surgery) Hennie Duos, MD as Consulting Physician (Rheumatology) Debbora Presto, NP as Nurse Practitioner (Neurology) Melrose Nakayama, MD as Consulting Physician (Orthopedic Surgery) Rockwell Germany, RN as Oncology Nurse Navigator Mauro Kaufmann, RN as Oncology Nurse Navigator Nicholas Lose, MD as Consulting Physician (Hematology and Oncology) Jovita Kussmaul, MD as Consulting Physician (General Surgery) Eppie Gibson, MD as Attending Physician (Radiation Oncology)  CHIEF COMPLAINTS/PURPOSE OF CONSULTATION:  Newly diagnosed breast cancer  HISTORY OF PRESENTING ILLNESS:  Pamela Hendricks 76 y.o. female is here because of recent diagnosis of right breast cancer.  Patient had a routine screening mammogram that detected calcifications that span 1.7 cm there was positive for grade 2 IDC with DCIS that was ER/PR positive HER2 negative with a Ki-67 of 10%.  There was a 0.3 cm retroareolar calcification which on biopsy was benign and concordant.  Axilla was negative.  She was presented this morning in the multidisciplinary tumor board and she is here today accompanied by her son and daughter-in-law to discuss her treatment plan.  I reviewed her records extensively and collaborated the history with the patient.  SUMMARY OF ONCOLOGIC HISTORY: Oncology History  Malignant neoplasm of upper-outer quadrant of right breast in female, estrogen receptor positive ([place])  10/13/2022 Initial Diagnosis   Screening mammogram detected right breast calcifications 1.7 cm span (history of CSL), additional 0.3 cm retroareolar calcifications within biopsy were benign and concordant.  Biopsy: Grade 2 IDC with DCIS ER 100%, PR 95%, HER2 1+ negative, Ki-67 10%   10/22/2022 Cancer Staging    Staging form: Breast, AJCC 8th Edition - Clinical: Stage IA (cT1c, cN0, cM0, G2, ER+, PR+, HER2-) - Signed by Nicholas Lose, MD on 10/22/2022 Stage prefix: Initial diagnosis Histologic grading system: 3 grade system      MEDICAL HISTORY:  Past Medical History:  Diagnosis Date   Arthritis    Carotid artery disease ([place])    a. duplex 07/2017 - 1-39% stenosis bilaterally.   Chronic frontal sinusitis    Elevated liver enzymes    per pt   Environmental and seasonal allergies    GERD (gastroesophageal reflux disease)    Hyperlipidemia    Hypertension    Migraines    sinus headaches, no longer having migraines   OSA (obstructive sleep apnea)    CPAP   Peripheral neuropathy    Peripheral vascular disease ([place])    carotid blockage - is followed by Dr. Marlou Porch   Pre-diabetes    Snores    Wears glasses     SURGICAL HISTORY: Past Surgical History:  Procedure Laterality Date   ABDOMINAL HYSTERECTOMY  1970   Total HYST   BREAST BIOPSY Bilateral 2001   benign   BREAST BIOPSY Right 10/13/2022   MM RT BREAST BX W LOC DEV 1ST LESION IMAGE BX SPEC STEREO GUIDE 10/13/2022 [facility]   BREAST BIOPSY Right 10/13/2022   MM RT BREAST BX W LOC DEV EA AD LESION IMG BX SPEC STEREO GUIDE 10/13/2022 [facility]   BREAST EXCISIONAL BIOPSY Right 2013   sclerosing lesion   BREAST LUMPECTOMY WITH RADIOACTIVE SEED LOCALIZATION Left 08/31/2018   Procedure: BREAST LUMPECTOMY WITH RADIOACTIVE SEED LOCALIZATION;  Surgeon: Fanny Skates, MD;  Location: [facility];  Service: General;  Laterality: Left;   COLONOSCOPY     EVALUATION  UNDER ANESTHESIA WITH HEMORRHOIDECTOMY N/A 09/18/2022   Procedure: ANORECTAL EXAM UNDER ANESTHESIA WITH HEMORRHOIDECTOMY WITH LIGATION AND HEMORRHOIDOPEXY X3;  Surgeon: Michael Boston, MD;  Location: [facility];  Service: General;  Laterality: N/A;  GEN AND LOCAL   JOINT REPLACEMENT Left 2001   Left Knee Replacement   KNEE ARTHROSCOPY Left    lt   NASAL SINUS SURGERY      NASAL SINUS SURGERY Bilateral 09/30/2019   Procedure: ENDOSCOPIC SINUS SURGERY;  Surgeon: Jerrell Belfast, MD;  Location: [facility];  Service: ENT;  Laterality: Bilateral;   TOE SURGERY     left foot digit # 2   TOTAL KNEE ARTHROPLASTY Right 10/12/2018   Procedure: TOTAL KNEE ARTHROPLASTY;  Surgeon: Melrose Nakayama, MD;  Location: [facility];  Service: Orthopedics;  Laterality: Right;   WISDOM TOOTH EXTRACTION      SOCIAL HISTORY: Social History   Socioeconomic History   Marital status: Widowed    Spouse name: Laverna Peace   Number of children: 2   Years of education: 11   Highest education level: Not on file  Occupational History   Occupation: retired    Comment: works partime with vendors  Tobacco Use   Smoking status: Never   Smokeless tobacco: Never  Scientific laboratory technician Use: Never used  Substance and Sexual Activity   Alcohol use: No   Drug use: No   Sexual activity: Not Currently  Other Topics Concern   Not on file  Social History Narrative   Patient is widowed. Patient is retired . 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 Patient has a 11th grade education. She has two children.   Social Determinants of Health   Financial Resource Strain: Not on file  Food Insecurity: Not on file  Transportation Needs: Not on file  Physical Activity: Not on file  Stress: Not on file  Social Connections: Not on file  Intimate Partner Violence: Not on file    FAMILY HISTORY: Family History  Problem Relation Age of Onset   Cancer Mother    Hypertension Mother    Hypertension Father    Heart disease Father        MI around 74, died at 27   [place] Sister        Brain Ca   CAD Daughter        Had bypass in her 33s (type 1 diabetic), died at 53    ALLERGIES:  is allergic to other.  MEDICATIONS:  Current Outpatient Medications  Medication Sig Dispense Refill   acetaminophen (TYLENOL) 500 MG tablet Take 1,000 mg by mouth every 6 (six)  hours as needed for mild pain or headache.     aspirin EC 81 MG tablet Take 81 mg by mouth daily.     atorvastatin (LIPITOR) 10 MG tablet Take 10 mg by mouth at bedtime.     benazepril (LOTENSIN) 40 MG tablet Take 40 mg by mouth daily.      Calcium Carb-Cholecalciferol (CALCIUM 600+D3 PO) Take 1 tablet by mouth 2 (two) times daily.     celecoxib (CELEBREX) 200 MG capsule Take 200 mg by mouth daily.     clobetasol ointment (TEMOVATE) 7.16 % Apply 1 application topically 2 (two) times daily.     estradiol (ESTRACE) 0.1 MG/GM vaginal cream Place 0.5 Applicatorfuls vaginally at bedtime.     fluocinonide cream (LIDEX) 9.67 % Apply 1 Application topically daily.  fluticasone (FLONASE) 50 MCG/ACT nasal spray Place 1 spray into both nostrils daily.     hydrochlorothiazide (HYDRODIURIL) 25 MG tablet Take 25 mg by mouth daily.     loratadine (CLARITIN) 10 MG tablet Take 10 mg by mouth daily.     Multiple Vitamins-Minerals (MULTIVITAMIN WITH MINERALS) tablet Take 1 tablet by mouth daily.     NON FORMULARY Pt uses a cpap nightly     nortriptyline (PAMELOR) 25 MG capsule TAKE 2 CAPSULES(50 MG) BY MOUTH AT BEDTIME 180 capsule 4   oxybutynin (DITROPAN) 5 MG tablet Take 5 mg by mouth 3 (three) times daily.     oxyCODONE (OXY IR/ROXICODONE) 5 MG immediate release tablet Take 1 tablet (5 mg total) by mouth every 6 (six) hours as needed for severe pain or breakthrough pain. 30 tablet 0   oxymetazoline (AFRIN) 0.05 % nasal spray Place 1 spray into both nostrils 2 (two) times daily.     pantoprazole (PROTONIX) 40 MG tablet Take 40 mg by mouth daily.      Polyethyl Glycol-Propyl Glycol (SYSTANE) 0.4-0.3 % SOLN Place 1 drop into both eyes in the morning.     polyethylene glycol (MIRALAX / GLYCOLAX) 17 g packet Take 17 g by mouth daily.     No current facility-administered medications for this visit.    REVIEW OF SYSTEMS:   Constitutional: Denies fevers, chills or abnormal night sweats Breast:  Denies any  palpable lumps or discharge All other systems were reviewed with the patient and are negative.  PHYSICAL EXAMINATION: ECOG PERFORMANCE STATUS: 0 - Asymptomatic  Vitals:   10/22/22 1232  BP: (!) 148/65  Pulse: 98  Resp: 18  Temp: (!) 97.5 F (36.4 C)  SpO2: 98%   Filed Weights   10/22/22 1232  Weight: 198 lb (89.8 kg)    GENERAL:alert, no distress and comfortable Shoulder issues  LABORATORY DATA:  I have reviewed the data as listed Lab Results  Component Value Date   WBC 10.8 (H) 10/22/2022   HGB 12.4 10/22/2022   HCT 37.5 10/22/2022   MCV 94.7 10/22/2022   PLT 267 10/22/2022   Lab Results  Component Value Date   NA 139 10/22/2022   K 3.8 10/22/2022   CL 101 10/22/2022   CO2 30 10/22/2022    RADIOGRAPHIC STUDIES: I have personally reviewed the radiological reports and agreed with the findings in the report.  ASSESSMENT AND PLAN:  Malignant neoplasm of upper-outer quadrant of right breast in female, estrogen receptor positive ([place]) 10/13/2022:Screening mammogram detected right breast calcifications 1.7 cm span (history of CSL), additional 0.3 cm retroareolar calcifications within biopsy were benign and concordant.  Biopsy: Grade 2 IDC with DCIS ER 100%, PR 95%, HER2 1+ negative, Ki-67 10%  Pathology and radiology counseling: Discussed with the patient, the details of pathology including the type of breast cancer,the clinical staging, the significance of ER, PR and HER-2/neu receptors and the implications for treatment. After reviewing the pathology in detail, we proceeded to discuss the different treatment options between surgery, radiation, chemotherapy, antiestrogen therapies.  Treatment plan: Breast conserving surgery with sentinel lymph node biopsy +/- Adjuvant radiation Adjuvant antiestrogen therapy  Return to clinic after surgery to discuss final pathology report   All questions were answered. The patient knows to call the clinic with any problems,  questions or concerns.    Harriette Ohara, MD 10/22/22

## 2022-10-22 NOTE — Research (Signed)
[employer] 2021-05 - Specimen Collection Study to Evaluate Biomarkers in Subjects with Cancer    Patient Pamela Hendricks was identified by Dr. Lindi Adie as a potential candidate for the above listed study.  This Clinical Research Coordinator met with Pamela Hendricks, [id number], on 10/22/22 in a manner and location that ensures patient privacy to discuss participation in the above listed research study.  Patient is Accompanied by family members .  A copy of the informed consent document with embedded HIPAA language was provided to the patient.  Patient reads, speaks, and understands [place].   Patient was provided with the business card of this Coordinator and encouraged to contact the research team with any questions.  Approximately 15 minutes were spent with the patient reviewing the informed consent documents.  Patient was provided the option of taking informed consent documents home to review and was encouraged to review at their convenience with their support network, including other care providers. Patient took the consent documents home to review.  Will follow up with patient on Wednesday, 10/29/2022, to confirm study participation. Patient will be offered the option of going to the [facility] site for blood collection.   Johny Drilling, [facility] 10/22/2022 4:10 PM

## 2022-10-22 NOTE — Progress Notes (Signed)
[facility] Psychosocial Distress Screening Spiritual Care  Met with Ivalee, her son, and her daughter in law in [facility] to introduce [facility] team/resources, reviewing distress screen per protocol.  The patient scored a 1 on the Psychosocial Distress Thermometer which indicates mild distress. Also assessed for distress and other psychosocial needs.      10/22/2022    4:16 PM  ONCBCN DISTRESS SCREENING  Distress experienced in past week (1-10) 1  Referral to support programs Yes   Lysle Morales, counseling intern, met with patient, her son, and her daughter in law in clinic. The patient reflected they live their life "in the positive." The patient reflected they leave their faith in God, the "greatest physician." The patient reported they have a strong family support system and they feel supported by their church and their pastor.   The patient has the counselor's contact information in case needs arise.   Follow up needed: No.  Lysle Morales,  Counseling Intern  [phone] [email or web address]

## 2022-10-22 NOTE — Therapy (Signed)
OUTPATIENT PHYSICAL THERAPY BREAST CANCER BASELINE EVALUATION   Patient Name: Pamela Hendricks MRN: [id number] DOB:09/12/1946, 76 y.o., female Today's Date: 10/22/2022  END OF SESSION:  PT End of Session - 10/22/22 1747     Visit Number 1    Number of Visits 2    Date for PT Re-Evaluation 12/17/22    PT Start Time 1303    PT Stop Time 1329   Also saw pt from 1344-1357 for a total of 39 min   PT Time Calculation (min) 26 min    Activity Tolerance Patient tolerated treatment well    Behavior During Therapy [facility] for tasks assessed/performed             Past Medical History:  Diagnosis Date   Arthritis    Breast cancer ([place])    Carotid artery disease ([place])    a. duplex 07/2017 - 1-39% stenosis bilaterally.   Chronic frontal sinusitis    Elevated liver enzymes    per pt   Environmental and seasonal allergies    GERD (gastroesophageal reflux disease)    Hyperlipidemia    Hypertension    Migraines    sinus headaches, no longer having migraines   OSA (obstructive sleep apnea)    CPAP   Peripheral neuropathy    Peripheral vascular disease ([place])    carotid blockage - is followed by Dr. Marlou Porch   Pre-diabetes    Snores    Wears glasses    Past Surgical History:  Procedure Laterality Date   ABDOMINAL HYSTERECTOMY  1970   Total HYST   BREAST BIOPSY Bilateral 2001   benign   BREAST BIOPSY Right 10/13/2022   MM RT BREAST BX W LOC DEV 1ST LESION IMAGE BX SPEC STEREO GUIDE 10/13/2022 [facility]   BREAST BIOPSY Right 10/13/2022   MM RT BREAST BX W LOC DEV EA AD LESION IMG BX SPEC STEREO GUIDE 10/13/2022 [facility]   BREAST EXCISIONAL BIOPSY Right 2013   sclerosing lesion   BREAST LUMPECTOMY WITH RADIOACTIVE SEED LOCALIZATION Left 08/31/2018   Procedure: BREAST LUMPECTOMY WITH RADIOACTIVE SEED LOCALIZATION;  Surgeon: Fanny Skates, MD;  Location: [place];  Service: General;  Laterality: Left;   COLONOSCOPY     EVALUATION UNDER ANESTHESIA WITH HEMORRHOIDECTOMY  N/A 09/18/2022   Procedure: ANORECTAL EXAM UNDER ANESTHESIA WITH HEMORRHOIDECTOMY WITH LIGATION AND HEMORRHOIDOPEXY X3;  Surgeon: Michael Boston, MD;  Location: [facility];  Service: General;  Laterality: N/A;  GEN AND LOCAL   JOINT REPLACEMENT Left 2001   Left Knee Replacement   KNEE ARTHROSCOPY Left    lt   NASAL SINUS SURGERY     NASAL SINUS SURGERY Bilateral 09/30/2019   Procedure: ENDOSCOPIC SINUS SURGERY;  Surgeon: Jerrell [place], MD;  Location: [place];  Service: ENT;  Laterality: Bilateral;   TOE SURGERY     left foot digit # 2   TOTAL KNEE ARTHROPLASTY Right 10/12/2018   Procedure: TOTAL KNEE ARTHROPLASTY;  Surgeon: Melrose Nakayama, MD;  Location: [place];  Service: Orthopedics;  Laterality: Right;   WISDOM TOOTH EXTRACTION     Patient Active Problem List   Diagnosis Date Noted   Malignant neoplasm of upper-outer quadrant of right breast in female, estrogen receptor positive ([place]) 10/20/2022   Chronic constipation 08/18/2022   Difficulty urinating 08/18/2022   Internal hemorrhoid, bleeding 08/18/2022   Prolapsed internal hemorrhoids, grade 4 08/18/2022   Pain in both lower extremities 09/16/2021   Essential hypertension 09/16/2021   Mixed hyperlipidemia 09/16/2021   Carotid artery plaque,  bilateral 09/16/2021   Aortic atherosclerosis ([place]) 09/16/2021   Chest pain of uncertain etiology 87/86/7672   Chronic daily headache 02/13/2020   Meralgia paresthetica of right side 02/13/2020   Status post operation on paranasal sinus 01/23/2020   Primary osteoarthritis of right knee 10/12/2018   Obstructive sleep apnea treated with continuous positive airway pressure (CPAP) 01/28/2018   OSA (obstructive sleep apnea) 08/27/2017   Excessive daytime sleepiness 08/27/2017   Paronychia of toe 06/16/2016   Sinusitis, chronic 08/13/2015   Migraine 11/22/2014   Sleep apnea 11/22/2014   Abnormal MRI of head 07/04/2013   Headache(784.0) 03/29/2013   Peripheral neuropathy    Major depressive  disorder    Abnormal mammogram 06/10/2012    REFERRING PROVIDER: Dr. Autumn Messing  REFERRING DIAG: Right breast cancer  THERAPY DIAG:  Malignant neoplasm of upper-outer quadrant of right breast in female, estrogen receptor positive ([place])  Abnormal posture  Rationale for Evaluation and Treatment: Rehabilitation  ONSET DATE: 09/17/2022  SUBJECTIVE:                                                                                                                                                                                           SUBJECTIVE STATEMENT: Patient reports she is here today to be seen by her medical team for her newly diagnosed right breast cancer.   PERTINENT HISTORY:  Patient was diagnosed on 09/17/2022 with right grade 2 invasive ductal carcinoma breast cancer. It measures 1.7 cm and is located in the upper outer quadrant. It is ER/PR positive and HER2 negative with a Ki67 of 10%.   PATIENT GOALS:   reduce lymphedema risk and learn post op HEP.   PAIN:  Are you having pain? Yes: NPRS scale: varies/10 Pain location: left shoulder Pain description: sharp/achy Aggravating factors: using arm Relieving factors: exercises and medicine  PRECAUTIONS: Active CA   HAND DOMINANCE: right  WEIGHT BEARING RESTRICTIONS: No  FALLS:  Has patient fallen in last 6 months? No  LIVING ENVIRONMENT: Patient lives with: alone Lives in: House/apartment Has following equipment at home: None  OCCUPATION: retired  LEISURE: She does not exercise  PRIOR LEVEL OF FUNCTION: Independent   OBJECTIVE:  COGNITION: Overall cognitive status: Within functional limits for tasks assessed    POSTURE:  Forward head and rounded shoulders posture  UPPER EXTREMITY AROM/PROM:  A/PROM RIGHT   eval   Shoulder extension 44  Shoulder flexion 155  Shoulder abduction 159  Shoulder internal rotation 63  Shoulder external rotation 83    (Blank rows = not tested)  A/PROM LEFT   Eval - all  limited by pain  Shoulder extension 33  Shoulder flexion 88  Shoulder abduction 96  Shoulder internal rotation 12  Shoulder external rotation 38    (Blank rows = not tested)  CERVICAL AROM: All within normal limits:    Percent limited  Flexion WNL  Extension 25% limited  Right lateral flexion 25% limited  Left lateral flexion 25% limited  Right rotation 25% limited  Left rotation 25% limited    UPPER EXTREMITY STRENGTH: Not tested left UE; right arm WFL  LYMPHEDEMA ASSESSMENTS:   LANDMARK RIGHT   eval  10 cm proximal to olecranon process 32.7  Olecranon process 26  10 cm proximal to ulnar styloid process 23  Just proximal to ulnar styloid process 15.2  Across hand at thumb web space 18.7  At base of 2nd digit 5.9  (Blank rows = not tested)  LANDMARK LEFT   eval  10 cm proximal to olecranon process 33.8  Olecranon process 26.8  10 cm proximal to ulnar styloid process 23  Just proximal to ulnar styloid process 15.9  Across hand at thumb web space 18.6  At base of 2nd digit 15.8  (Blank rows = not tested)  L-DEX LYMPHEDEMA SCREENING:  The patient was assessed using the L-Dex machine today to produce a lymphedema index baseline score. The patient will be reassessed on a regular basis (typically every 3 months) to obtain new L-Dex scores. If the score is > 6.5 points away from his/her baseline score indicating onset of subclinical lymphedema, it will be recommended to wear a compression garment for 4 weeks, 12 hours per day and then be reassessed. If the score continues to be > 6.5 points from baseline at reassessment, we will initiate lymphedema treatment. Assessing in this manner has a 95% rate of preventing clinically significant lymphedema.   L-DEX FLOWSHEETS - 10/22/22 1700       L-DEX LYMPHEDEMA SCREENING   Measurement Type Unilateral    L-DEX MEASUREMENT EXTREMITY Upper Extremity    POSITION  Standing    DOMINANT SIDE Right    At Risk Side Right    BASELINE  SCORE (UNILATERAL) -1.5             QUICK DASH SURVEY:  Katina Dung - 10/22/22 0001     Open a tight or new jar Moderate difficulty    Do heavy household chores (wash walls, wash floors) No difficulty    Carry a shopping bag or briefcase No difficulty    Wash your back No difficulty    Use a knife to cut food No difficulty    Recreational activities in which you take some force or impact through your arm, shoulder, or hand (golf, hammering, tennis) No difficulty    During the past week, to what extent has your arm, shoulder or hand problem interfered with your normal social activities with family, friends, neighbors, or groups? Not at all    During the past week, to what extent has your arm, shoulder or hand problem limited your work or other regular daily activities Not at all    Arm, shoulder, or hand pain. Mild    Tingling (pins and needles) in your arm, shoulder, or hand None    Difficulty Sleeping No difficulty    DASH Score 6.82 %              PATIENT EDUCATION:  Education details: Lymphedema risk reduction and post op shoulder/posture HEP Person educated: Patient Education method: Explanation, Demonstration, Handout Education comprehension: Patient verbalized understanding and returned demonstration  HOME EXERCISE  PROGRAM: Patient was instructed today in a home exercise program today for post op shoulder range of motion. These included active assist shoulder flexion in sitting, scapular retraction, wall walking with shoulder abduction, and hands behind head external rotation.  She was encouraged to do these twice a day, holding 3 seconds and repeating 5 times when permitted by her physician.   ASSESSMENT:  CLINICAL IMPRESSION: Patient was diagnosed on 09/17/2022 with right grade 2 invasive ductal carcinoma breast cancer. It measures 1.7 cm and is located in the upper outer quadrant. It is ER/PR positive and HER2 negative with a Ki67 of 10%. Her multidisciplinary  medical team met prior to her assessments to determine a recommended treatment plan. She is planning to have a right lumpectomy and sentinel node biopsy followed by possible radiation and anti-estrogen therapy. She will benefit from a post op PT reassessment to determine needs and from L-Dex screens every 3 months for 2 years to detect subclinical lymphedema.  Pt will benefit from skilled therapeutic intervention to improve on the following deficits: Decreased knowledge of precautions, impaired UE functional use, pain, decreased ROM, postural dysfunction.   PT treatment/interventions: ADL/self-care home management, pt/family education, therapeutic exercise  REHAB POTENTIAL: Excellent  CLINICAL DECISION MAKING: Stable/uncomplicated  EVALUATION COMPLEXITY: Low   GOALS: Goals reviewed with patient? YES  LONG TERM GOALS: (STG=LTG)    Name Target Date Goal status  1 Pt will be able to verbalize understanding of pertinent lymphedema risk reduction practices relevant to her dx specifically related to skin care.  Baseline:  No knowledge 10/22/2022 Achieved at eval  2 Pt will be able to return demo and/or verbalize understanding of the post op HEP related to regaining shoulder ROM. Baseline:  No knowledge 10/22/2022 Achieved at eval  3 Pt will be able to verbalize understanding of the importance of attending the post op After Breast CA Class for further lymphedema risk reduction education and therapeutic exercise.  Baseline:  No knowledge 10/22/2022 Achieved at eval  4 Pt will demo she has regained full shoulder ROM and function post operatively compared to baselines.  Baseline: See objective measurements taken today. 12/17/2022     PLAN:  PT FREQUENCY/DURATION: EVAL and 1 follow up appointment.   PLAN FOR NEXT SESSION: will reassess 3-4 weeks post op to determine needs.   Patient will follow up at outpatient cancer rehab 3-4 weeks following surgery.  If the patient requires physical therapy  at that time, a specific plan will be dictated and sent to the referring physician for approval. The patient was educated today on appropriate basic range of motion exercises to begin post operatively and the importance of attending the After Breast Cancer class following surgery.  Patient was educated today on lymphedema risk reduction practices as it pertains to recommendations that will benefit the patient immediately following surgery.  She verbalized good understanding.    Physical Therapy Information for After Breast Cancer Surgery/Treatment:  Lymphedema is a swelling condition that you may be at risk for in your arm if you have lymph nodes removed from the armpit area.  After a sentinel node biopsy, the risk is approximately 5-9% and is higher after an axillary node dissection.  There is treatment available for this condition and it is not life-threatening.  Contact your physician or physical therapist with concerns. You may begin the 4 shoulder/posture exercises (see additional sheet) when permitted by your physician (typically a week after surgery).  If you have drains, you may need to wait until those  are removed before beginning range of motion exercises.  A general recommendation is to not lift your arms above shoulder height until drains are removed.  These exercises should be done to your tolerance and gently.  This is not a "no pain/no gain" type of recovery so listen to your body and stretch into the range of motion that you can tolerate, stopping if you have pain.  If you are having immediate reconstruction, ask your plastic surgeon about doing exercises as he or she may want you to wait. We encourage you to attend the free one time ABC (After Breast Cancer) class offered by [facility].  You will learn information related to lymphedema risk, prevention and treatment and additional exercises to regain mobility following surgery.  You can call [phone] for more  information.  This is offered the 1st and 3rd Monday of each month.  You only attend the class one time. While undergoing any medical procedure or treatment, try to avoid blood pressure being taken or needle sticks from occurring on the arm on the side of cancer.   This recommendation begins after surgery and continues for the rest of your life.  This may help reduce your risk of getting lymphedema (swelling in your arm). An excellent resource for those seeking information on lymphedema is the National Lymphedema Network's web site. It can be accessed at [place].org If you notice swelling in your hand, arm or breast at any time following surgery (even if it is many years from now), please contact your doctor or physical therapist to discuss this.  Lymphedema can be treated at any time but it is easier for you if it is treated early on.  If you feel like your shoulder motion is not returning to normal in a reasonable amount of time, please contact your surgeon or physical therapist.  [facility] [phone]. [address], [address],  [place] [address]  ABC CLASS After Breast Cancer Class  After Breast Cancer Class is a specially designed exercise class to assist you in a safe recover after having breast cancer surgery.  In this class you will learn how to get back to full function whether your drains were just removed or if you had surgery a month ago.  This one-time class is held the 1st and 3rd Monday of every month from 11:00 a.m. until 12:00 noon virtually.  This class is FREE and space is limited. For more information or to register for the next available class, call [phone].  Class Goals  Understand specific stretches to improve the flexibility of you chest and shoulder. Learn ways to safely strengthen your upper body and improve your posture. Understand the warning signs of infection and why you may be at risk for an arm infection. Learn  about Lymphedema and prevention.  ** You do not attend this class until after surgery.  Drains must be removed to participate  Patient was instructed today in a home exercise program today for post op shoulder range of motion. These included active assist shoulder flexion in sitting, scapular retraction, wall walking with shoulder abduction, and hands behind head external rotation.  She was encouraged to do these twice a day, holding 3 seconds and repeating 5 times when permitted by her physician.  Annia Friendly, [place] 10/22/22 6:10 PM

## 2022-10-23 ENCOUNTER — Encounter: Payer: Self-pay | Admitting: *Deleted

## 2022-10-24 ENCOUNTER — Other Ambulatory Visit: Payer: Self-pay | Admitting: General Surgery

## 2022-10-24 DIAGNOSIS — C50411 Malignant neoplasm of upper-outer quadrant of right female breast: Secondary | ICD-10-CM

## 2022-10-27 ENCOUNTER — Telehealth: Payer: Self-pay

## 2022-10-27 DIAGNOSIS — Z170 Estrogen receptor positive status [ER+]: Secondary | ICD-10-CM

## 2022-10-27 NOTE — Telephone Encounter (Signed)
[employer] 2021-05 - Specimen Collection Study to Evaluate Biomarkers in Subjects with Cancer    Called the patient to follow up about the above mentioned study to confirm participation. Patient reports that they still need more time to review the consent form before making a decision ob participation.   Pamela Hendricks, [facility] 10/27/2022 11:01 AM

## 2022-10-28 ENCOUNTER — Encounter: Payer: Self-pay | Admitting: *Deleted

## 2022-10-28 ENCOUNTER — Telehealth: Payer: Self-pay | Admitting: *Deleted

## 2022-10-28 DIAGNOSIS — Z170 Estrogen receptor positive status [ER+]: Secondary | ICD-10-CM

## 2022-10-28 NOTE — Telephone Encounter (Signed)
Spoke with pt concerning [place] from 10/22/22. Denies questions regarding dx or treatment care plan. Discussed future appts. Encourage pt to call with needs. Received verbal understanding.

## 2022-10-29 DIAGNOSIS — C50911 Malignant neoplasm of unspecified site of right female breast: Secondary | ICD-10-CM | POA: Diagnosis not present

## 2022-10-29 DIAGNOSIS — M19012 Primary osteoarthritis, left shoulder: Secondary | ICD-10-CM | POA: Diagnosis not present

## 2022-11-06 ENCOUNTER — Telehealth: Payer: Self-pay

## 2022-11-06 DIAGNOSIS — C50411 Malignant neoplasm of upper-outer quadrant of right female breast: Secondary | ICD-10-CM

## 2022-11-13 ENCOUNTER — Encounter (HOSPITAL_BASED_OUTPATIENT_CLINIC_OR_DEPARTMENT_OTHER): Payer: Self-pay | Admitting: General Surgery

## 2022-11-13 ENCOUNTER — Other Ambulatory Visit: Payer: Self-pay

## 2022-11-13 NOTE — Progress Notes (Signed)
I spoke with Abigail Butts from [facility] who will address with Dr. Marlou Starks if the pt needs to stop her ASA '81mg'$  or if it will be ok to continue it before the procedure. If needed, she will get that clearance to stop from Dr. Marlou Porch office. Abigail Butts will be the one to relay that info to the pt. The pt is aware that she will bear back from [place] for her instructions.

## 2022-11-19 ENCOUNTER — Ambulatory Visit
Admission: RE | Admit: 2022-11-19 | Discharge: 2022-11-19 | Disposition: A | Discharge status: Home / Self Care | Payer: 59 | Source: Ambulatory Visit | Attending: General Surgery | Admitting: General Surgery

## 2022-11-19 DIAGNOSIS — Z170 Estrogen receptor positive status [ER+]: Secondary | ICD-10-CM

## 2022-11-19 DIAGNOSIS — C50411 Malignant neoplasm of upper-outer quadrant of right female breast: Secondary | ICD-10-CM | POA: Diagnosis not present

## 2022-11-19 HISTORY — PX: BREAST BIOPSY: SHX20

## 2022-11-19 NOTE — Progress Notes (Addendum)
Texted patient to remind them to pick up soap and drink today before surgery  Patient provided with CHG soap along with education on application. Patient understanding of information         Patient Instructions  The night before surgery:  No food after midnight. ONLY clear liquids after midnight  The day of surgery (if you do NOT have diabetes):  Drink ONE (1) Pre-Surgery Clear Ensure as directed.   This drink was given to you during your hospital  pre-op appointment visit. The pre-op nurse will instruct you on the time to drink the  Pre-Surgery Ensure depending on your surgery time. Finish the drink at the designated time by the pre-op nurse.  Nothing else to drink after completing the  Pre-Surgery Clear Ensure.  The day of surgery (if you have diabetes): Drink ONE (1) Gatorade 2 (G2) as directed. This drink was given to you during your hospital  pre-op appointment visit.  The pre-op nurse will instruct you on the time to drink the   Gatorade 2 (G2) depending on your surgery time. Color of the Gatorade may vary. Red is not allowed. Nothing else to drink after completing the  Gatorade 2 (G2).         If you have questions, please contact your surgeon's office.

## 2022-11-20 ENCOUNTER — Ambulatory Visit
Admission: RE | Admit: 2022-11-20 | Discharge: 2022-11-20 | Disposition: A | Discharge status: Home / Self Care | Payer: Medicare Other | Source: Ambulatory Visit | Attending: General Surgery | Admitting: General Surgery

## 2022-11-20 ENCOUNTER — Ambulatory Visit (HOSPITAL_BASED_OUTPATIENT_CLINIC_OR_DEPARTMENT_OTHER)
Admission: RE | Admit: 2022-11-20 | Discharge: 2022-11-20 | Disposition: A | Discharge status: Home / Self Care | Payer: 59 | Attending: General Surgery | Admitting: General Surgery

## 2022-11-20 ENCOUNTER — Ambulatory Visit (HOSPITAL_BASED_OUTPATIENT_CLINIC_OR_DEPARTMENT_OTHER): Payer: 59 | Admitting: Anesthesiology

## 2022-11-20 ENCOUNTER — Encounter (HOSPITAL_BASED_OUTPATIENT_CLINIC_OR_DEPARTMENT_OTHER): Payer: Self-pay | Admitting: General Surgery

## 2022-11-20 ENCOUNTER — Encounter (HOSPITAL_BASED_OUTPATIENT_CLINIC_OR_DEPARTMENT_OTHER)
Admission: RE | Disposition: A | Discharge status: Home / Self Care | Payer: Self-pay | Source: Home / Self Care | Attending: General Surgery

## 2022-11-20 ENCOUNTER — Ambulatory Visit (HOSPITAL_COMMUNITY)
Admission: RE | Admit: 2022-11-20 | Discharge: 2022-11-20 | Disposition: A | Discharge status: Home / Self Care | Payer: 59 | Source: Ambulatory Visit | Attending: General Surgery | Admitting: General Surgery

## 2022-11-20 ENCOUNTER — Other Ambulatory Visit: Payer: Self-pay

## 2022-11-20 DIAGNOSIS — G8918 Other acute postprocedural pain: Secondary | ICD-10-CM | POA: Diagnosis not present

## 2022-11-20 DIAGNOSIS — Z170 Estrogen receptor positive status [ER+]: Secondary | ICD-10-CM | POA: Diagnosis not present

## 2022-11-20 DIAGNOSIS — C50411 Malignant neoplasm of upper-outer quadrant of right female breast: Secondary | ICD-10-CM | POA: Diagnosis not present

## 2022-11-20 DIAGNOSIS — C50911 Malignant neoplasm of unspecified site of right female breast: Secondary | ICD-10-CM | POA: Diagnosis not present

## 2022-11-20 DIAGNOSIS — I10 Essential (primary) hypertension: Secondary | ICD-10-CM | POA: Insufficient documentation

## 2022-11-20 DIAGNOSIS — E119 Type 2 diabetes mellitus without complications: Secondary | ICD-10-CM | POA: Insufficient documentation

## 2022-11-20 DIAGNOSIS — K219 Gastro-esophageal reflux disease without esophagitis: Secondary | ICD-10-CM | POA: Diagnosis not present

## 2022-11-20 DIAGNOSIS — Z01818 Encounter for other preprocedural examination: Secondary | ICD-10-CM

## 2022-11-20 DIAGNOSIS — G4733 Obstructive sleep apnea (adult) (pediatric): Secondary | ICD-10-CM | POA: Insufficient documentation

## 2022-11-20 HISTORY — PX: BREAST LUMPECTOMY WITH RADIOACTIVE SEED AND SENTINEL LYMPH NODE BIOPSY: SHX6550

## 2022-11-20 SURGERY — BREAST LUMPECTOMY WITH RADIOACTIVE SEED AND SENTINEL LYMPH NODE BIOPSY
Anesthesia: Regional | Site: Breast | Laterality: Right

## 2022-11-20 MED ORDER — MIDAZOLAM HCL 2 MG/2ML IJ SOLN
2.0000 mg | Freq: Once | INTRAMUSCULAR | Status: AC
  Administered 2022-11-20: 2 mg via INTRAVENOUS

## 2022-11-20 MED ORDER — DEXAMETHASONE SODIUM PHOSPHATE 10 MG/ML IJ SOLN
INTRAMUSCULAR | Status: AC
  Filled 2022-11-20: qty 1

## 2022-11-20 MED ORDER — HYDROMORPHONE HCL 1 MG/ML IJ SOLN
0.2500 mg | INTRAMUSCULAR | Status: DC | PRN
  Administered 2022-11-20 (×2): 0.5 mg via INTRAVENOUS
  Administered 2022-11-20 (×2): 0.25 mg via INTRAVENOUS

## 2022-11-20 MED ORDER — ACETAMINOPHEN 500 MG PO TABS: 1000.0000 mg | ORAL_TABLET | ORAL | Status: AC

## 2022-11-20 MED ORDER — ONDANSETRON HCL 4 MG/2ML IJ SOLN: 4.0000 mg | Freq: Once | INTRAMUSCULAR | Status: DC | PRN

## 2022-11-20 MED ORDER — OXYCODONE HCL 5 MG PO TABS: 5.0000 mg | ORAL_TABLET | Freq: Four times a day (QID) | ORAL | 0 refills | Status: DC | PRN

## 2022-11-20 MED ORDER — PROPOFOL 10 MG/ML IV BOLUS
INTRAVENOUS | Status: DC | PRN
  Administered 2022-11-20: 150 mg via INTRAVENOUS

## 2022-11-20 MED ORDER — CEFAZOLIN SODIUM-DEXTROSE 2-4 GM/100ML-% IV SOLN
INTRAVENOUS | Status: AC
  Filled 2022-11-20: qty 100

## 2022-11-20 MED ORDER — LIDOCAINE HCL (CARDIAC) PF 100 MG/5ML IV SOSY
PREFILLED_SYRINGE | INTRAVENOUS | Status: DC | PRN
  Administered 2022-11-20: 40 mg via INTRAVENOUS

## 2022-11-20 MED ORDER — CHLORHEXIDINE GLUCONATE CLOTH 2 % EX PADS: 6.0000 | MEDICATED_PAD | Freq: Once | CUTANEOUS | Status: DC

## 2022-11-20 MED ORDER — DEXAMETHASONE SODIUM PHOSPHATE 10 MG/ML IJ SOLN
INTRAMUSCULAR | Status: DC | PRN
  Administered 2022-11-20: 5 mg via INTRAVENOUS

## 2022-11-20 MED ORDER — LACTATED RINGERS IV SOLN: INTRAVENOUS | Status: DC | PRN

## 2022-11-20 MED ORDER — LIDOCAINE 2% (20 MG/ML) 5 ML SYRINGE
INTRAMUSCULAR | Status: AC
  Filled 2022-11-20: qty 5

## 2022-11-20 MED ORDER — PROPOFOL 10 MG/ML IV BOLUS
INTRAVENOUS | Status: AC
  Filled 2022-11-20: qty 20

## 2022-11-20 MED ORDER — GABAPENTIN 100 MG PO CAPS
ORAL_CAPSULE | ORAL | Status: AC
  Filled 2022-11-20: qty 1

## 2022-11-20 MED ORDER — TECHNETIUM TC 99M TILMANOCEPT KIT
1.0000 | PACK | Freq: Once | INTRAVENOUS | Status: AC | PRN
  Administered 2022-11-20: 1 via INTRADERMAL

## 2022-11-20 MED ORDER — ACETAMINOPHEN 500 MG PO TABS
1000.0000 mg | ORAL_TABLET | Freq: Once | ORAL | Status: AC
  Administered 2022-11-20: 1000 mg via ORAL

## 2022-11-20 MED ORDER — PHENYLEPHRINE HCL (PRESSORS) 10 MG/ML IV SOLN
INTRAVENOUS | Status: DC | PRN
  Administered 2022-11-20: 160 ug via INTRAVENOUS

## 2022-11-20 MED ORDER — HYDROMORPHONE HCL 1 MG/ML IJ SOLN
INTRAMUSCULAR | Status: AC
  Filled 2022-11-20: qty 0.5

## 2022-11-20 MED ORDER — FENTANYL CITRATE (PF) 100 MCG/2ML IJ SOLN
100.0000 ug | Freq: Once | INTRAMUSCULAR | Status: AC
  Administered 2022-11-20: 100 ug via INTRAVENOUS

## 2022-11-20 MED ORDER — FENTANYL CITRATE (PF) 100 MCG/2ML IJ SOLN
INTRAMUSCULAR | Status: AC
  Filled 2022-11-20: qty 2

## 2022-11-20 MED ORDER — AMISULPRIDE (ANTIEMETIC) 5 MG/2ML IV SOLN: 10.0000 mg | Freq: Once | INTRAVENOUS | Status: DC | PRN

## 2022-11-20 MED ORDER — ONDANSETRON HCL 4 MG/2ML IJ SOLN
INTRAMUSCULAR | Status: AC
  Filled 2022-11-20: qty 2

## 2022-11-20 MED ORDER — BUPIVACAINE LIPOSOME 1.3 % IJ SUSP
INTRAMUSCULAR | Status: DC | PRN
  Administered 2022-11-20: 10 mL via PERINEURAL

## 2022-11-20 MED ORDER — OXYCODONE HCL 5 MG/5ML PO SOLN: 5.0000 mg | Freq: Once | ORAL | Status: DC | PRN

## 2022-11-20 MED ORDER — LACTATED RINGERS IV SOLN: INTRAVENOUS | Status: DC

## 2022-11-20 MED ORDER — ONDANSETRON HCL 4 MG/2ML IJ SOLN
INTRAMUSCULAR | Status: DC | PRN
  Administered 2022-11-20: 4 mg via INTRAVENOUS

## 2022-11-20 MED ORDER — BUPIVACAINE-EPINEPHRINE 0.25% -1:200000 IJ SOLN
INTRAMUSCULAR | Status: DC | PRN
  Administered 2022-11-20: 21 mL

## 2022-11-20 MED ORDER — FENTANYL CITRATE (PF) 100 MCG/2ML IJ SOLN
INTRAMUSCULAR | Status: DC | PRN
  Administered 2022-11-20: 100 ug via INTRAVENOUS
  Administered 2022-11-20 (×2): 50 ug via INTRAVENOUS

## 2022-11-20 MED ORDER — MIDAZOLAM HCL 2 MG/2ML IJ SOLN
INTRAMUSCULAR | Status: AC
  Filled 2022-11-20: qty 2

## 2022-11-20 MED ORDER — CEFAZOLIN SODIUM-DEXTROSE 2-3 GM-%(50ML) IV SOLR
INTRAVENOUS | Status: DC | PRN
  Administered 2022-11-20: 2 g via INTRAVENOUS

## 2022-11-20 MED ORDER — OXYCODONE HCL 5 MG PO TABS: 5.0000 mg | ORAL_TABLET | Freq: Once | ORAL | Status: DC | PRN

## 2022-11-20 MED ORDER — ACETAMINOPHEN 500 MG PO TABS
ORAL_TABLET | ORAL | Status: AC
  Filled 2022-11-20: qty 2

## 2022-11-20 MED ORDER — ROPIVACAINE HCL 5 MG/ML IJ SOLN
INTRAMUSCULAR | Status: DC | PRN
  Administered 2022-11-20: 20 mL via PERINEURAL

## 2022-11-20 MED ORDER — CEFAZOLIN SODIUM-DEXTROSE 2-4 GM/100ML-% IV SOLN: 2.0000 g | INTRAVENOUS | Status: DC

## 2022-11-20 MED ORDER — GABAPENTIN 100 MG PO CAPS
100.0000 mg | ORAL_CAPSULE | ORAL | Status: AC
  Administered 2022-11-20: 100 mg via ORAL

## 2022-11-20 SURGICAL SUPPLY — 48 items
ADH SKN CLS APL DERMABOND .7 (GAUZE/BANDAGES/DRESSINGS) ×1
APL PRP STRL LF DISP 70% ISPRP (MISCELLANEOUS) ×1
APPLIER CLIP 9.375 MED OPEN (MISCELLANEOUS) ×1
APR CLP MED 9.3 20 MLT OPN (MISCELLANEOUS) ×1
BLADE SURG 15 STRL LF DISP TIS (BLADE) ×1 IMPLANT
BLADE SURG 15 STRL SS (BLADE) ×1
CANISTER SUC SOCK COL 7IN (MISCELLANEOUS) IMPLANT
CANISTER SUCT 1200ML W/VALVE (MISCELLANEOUS) IMPLANT
CHLORAPREP W/TINT 26 (MISCELLANEOUS) ×1 IMPLANT
CLIP APPLIE 9.375 MED OPEN (MISCELLANEOUS) ×1 IMPLANT
COVER BACK TABLE 60X90IN (DRAPES) ×1 IMPLANT
COVER MAYO STAND STRL (DRAPES) ×1 IMPLANT
COVER PROBE CYLINDRICAL 5X96 (MISCELLANEOUS) ×1 IMPLANT
DERMABOND ADVANCED .7 DNX12 (GAUZE/BANDAGES/DRESSINGS) ×1 IMPLANT
DRAPE LAPAROSCOPIC ABDOMINAL (DRAPES) ×1 IMPLANT
DRAPE UTILITY XL STRL (DRAPES) ×1 IMPLANT
ELECT COATED BLADE 2.86 ST (ELECTRODE) ×1 IMPLANT
ELECT REM PT RETURN 9FT ADLT (ELECTROSURGICAL) ×1
ELECTRODE REM PT RTRN 9FT ADLT (ELECTROSURGICAL) ×1 IMPLANT
GLOVE BIO SURGEON STRL SZ7.5 (GLOVE) ×1 IMPLANT
GLOVE BIOGEL PI IND STRL 6.5 (GLOVE) IMPLANT
GLOVE BIOGEL PI IND STRL 7.0 (GLOVE) IMPLANT
GLOVE ECLIPSE 6.5 STRL STRAW (GLOVE) IMPLANT
GOWN STRL REUS W/ TWL LRG LVL3 (GOWN DISPOSABLE) ×2 IMPLANT
GOWN STRL REUS W/ TWL XL LVL3 (GOWN DISPOSABLE) IMPLANT
GOWN STRL REUS W/TWL LRG LVL3 (GOWN DISPOSABLE) ×3
GOWN STRL REUS W/TWL XL LVL3 (GOWN DISPOSABLE) ×1
ILLUMINATOR WAVEGUIDE N/F (MISCELLANEOUS) IMPLANT
KIT MARKER MARGIN INK (KITS) ×1 IMPLANT
LIGHT WAVEGUIDE WIDE FLAT (MISCELLANEOUS) IMPLANT
NDL HYPO 25X1 1.5 SAFETY (NEEDLE) ×1 IMPLANT
NDL SAFETY ECLIP 18X1.5 (MISCELLANEOUS) IMPLANT
NEEDLE HYPO 25X1 1.5 SAFETY (NEEDLE) ×1 IMPLANT
NS IRRIG 1000ML POUR BTL (IV SOLUTION) IMPLANT
PACK BASIN DAY SURGERY FS (CUSTOM PROCEDURE TRAY) ×1 IMPLANT
PENCIL SMOKE EVACUATOR (MISCELLANEOUS) ×1 IMPLANT
SLEEVE SCD COMPRESS KNEE MED (STOCKING) ×1 IMPLANT
SPIKE FLUID TRANSFER (MISCELLANEOUS) IMPLANT
SPONGE T-LAP 18X18 ~~LOC~~+RFID (SPONGE) ×1 IMPLANT
SUT MON AB 4-0 PC3 18 (SUTURE) ×2 IMPLANT
SUT SILK 2 0 SH (SUTURE) IMPLANT
SUT VICRYL 3-0 CR8 SH (SUTURE) ×1 IMPLANT
SYR CONTROL 10ML LL (SYRINGE) ×1 IMPLANT
TOWEL GREEN STERILE FF (TOWEL DISPOSABLE) ×1 IMPLANT
TRACER MAGTRACE VIAL (MISCELLANEOUS) IMPLANT
TRAY FAXITRON CT DISP (TRAY / TRAY PROCEDURE) ×1 IMPLANT
TUBE CONNECTING 20X1/4 (TUBING) IMPLANT
YANKAUER SUCT BULB TIP NO VENT (SUCTIONS) IMPLANT

## 2022-11-20 NOTE — Anesthesia Postprocedure Evaluation (Signed)
Anesthesia Post Note  Patient: Pamela Hendricks  Procedure(s) Performed: RIGHT BREAST LUMPECTOMY WITH RADIOACTIVE SEED AND SENTINEL LYMPH NODE BIOPSY (Right: Breast)     Patient location during evaluation: PACU Anesthesia Type: Regional and General Level of consciousness: awake and alert, oriented and patient cooperative Pain management: pain level controlled Vital Signs Assessment: post-procedure vital signs reviewed and stable Respiratory status: spontaneous breathing, nonlabored ventilation and respiratory function stable Cardiovascular status: blood pressure returned to baseline and stable Postop Assessment: no apparent nausea or vomiting Anesthetic complications: no   No notable events documented.  Last Vitals:  Vitals:   11/20/22 1430 11/20/22 1445  BP: 115/84 134/69  Pulse: 78 74  Resp: 19 12  Temp:    SpO2: 100% 100%    Last Pain:  Vitals:   11/20/22 1452  TempSrc:   PainSc: [place]

## 2022-11-20 NOTE — Anesthesia Procedure Notes (Signed)
Anesthesia Regional Block: Pectoralis block   Pre-Anesthetic Checklist: , timeout performed,  Correct Patient, Correct Site, Correct Laterality,  Correct Procedure, Correct Position, site marked,  Risks and benefits discussed,  Surgical consent,  Pre-op evaluation,  At surgeon's request and post-op pain management  Laterality: Right  Prep: Maximum Sterile Barrier Precautions used, chloraprep       Needles:  Injection technique: Single-shot  Needle Type: Echogenic Stimulator Needle     Needle Length: 9cm  Needle Gauge: 22     Additional Needles:   Procedures:,,,, ultrasound used (permanent image in chart),,    Narrative:  Start time: 11/20/2022 11:55 AM End time: 11/20/2022 12:00 PM Injection made incrementally with aspirations every 5 mL.  Performed by: Personally  Anesthesiologist: Pervis Hocking, DO  Additional Notes: Monitors applied. No increased pain on injection. No increased resistance to injection. Injection made in 5cc increments. Good needle visualization. Patient tolerated procedure well.

## 2022-11-20 NOTE — Interval H&P Note (Signed)
History and Physical Interval Note:  11/20/2022 12:16 PM  Pamela Hendricks  has presented today for surgery, with the diagnosis of RIGHT BREAST CANCER.  The various methods of treatment have been discussed with the patient and family. After consideration of risks, benefits and other options for treatment, the patient has consented to  Procedure(s): RIGHT BREAST LUMPECTOMY WITH RADIOACTIVE SEED AND SENTINEL LYMPH NODE BIOPSY (Right) as a surgical intervention.  The patient's history has been reviewed, patient examined, no change in status, stable for surgery.  I have reviewed the patient's chart and labs.  Questions were answered to the patient's satisfaction.     Autumn Messing III

## 2022-11-20 NOTE — Op Note (Signed)
11/20/2022  2:04 PM  PATIENT:  Pamela Hendricks  77 y.o. female  PRE-OPERATIVE DIAGNOSIS:  RIGHT BREAST CANCER  POST-OPERATIVE DIAGNOSIS:  RIGHT BREAST CANCER  PROCEDURE:  Procedure(s): RIGHT BREAST LUMPECTOMY WITH RADIOACTIVE SEED LOCALIZATION AND DEEP RIGHT AXILLARY SENTINEL LYMPH NODE BIOPSY (Right)  SURGEON:  Surgeon(s) and Role:    * Jovita Kussmaul, MD - Primary  PHYSICIAN ASSISTANT:   ASSISTANTS: none   ANESTHESIA:   local and general  EBL:  10 mL   BLOOD ADMINISTERED:none  DRAINS: none   LOCAL MEDICATIONS USED:  MARCAINE     SPECIMEN:  Source of Specimen:  right breast tissue and sentinel node  DISPOSITION OF SPECIMEN:  PATHOLOGY  COUNTS:  YES  TOURNIQUET:  * No tourniquets in log *  DICTATION: .Dragon Dictation  After informed consent was obtained the patient was brought to the operating room and placed in the supine position on the operating table.  After adequate induction of general anesthesia the patient's right chest, breast, and axillary area were prepped with ChloraPrep, allowed to dry, and draped in usual sterile manner.  An appropriate timeout was performed.  Previously an I-125 seed was placed in the outer aspect of the right breast to mark an area of invasive breast cancer.  Also earlier in the day the patient underwent injection of 1 mCi of technetium sulfur colloid in the subareolar area of the right breast.  Attention was first turned to the breast itself.  The neoprobe was set to I-125 in the area of radioactivity was readily identified.  The area around this was infiltrated with quarter percent Marcaine.  A curvilinear incision was made with a 15 blade knife along the outer edge of the areola of the right breast.  The incision was carried through the skin and subcutaneous tissue sharply with the electrocautery.  The seed was essentially straight in from this incision.  The dissection was then carried widely around the radioactive seed while checking the  area of radioactivity frequently.  Once the specimen was removed it was oriented with the appropriate paint colors.  A specimen radiograph was obtained that showed the clip and seed to be near the center of the specimen.  The specimen was then sent to pathology for further evaluation.  Hemostasis was achieved using the Bovie electrocautery.  The wound was irrigated with saline and infiltrated with more quarter percent Marcaine.  The cavity was marked with clips.  The deep layer of the incision was closed with layers of interrupted 3-0 Vicryl stitches.  The skin was then closed with interrupted 4-0 Monocryl subcuticular stitches.  Attention was then turned to the right axilla.  The neoprobe was set to technetium and an area of radioactivity was readily identified.  The area overlying this was infiltrated with quarter percent Marcaine.  A small transversely oriented incision was made with a 15 blade knife overlying the area of radioactivity.  The incision was carried through the skin and subcutaneous tissue sharply with the electrocautery until the deep right axillary space was entered.  The neoprobe was used to direct blunt hemostat dissection.  I was able to identify a hot lymph node.  This lymph node was excised sharply with the electrocautery and the surrounding small vessels and lymphatics were controlled with clips.  Ex vivo counts on this node were approximately 200.  No other hot or palpable nodes were identified in the right axilla.  Hemostasis was achieved using the Bovie electrocautery.  The deep layer of the  incision was closed with interrupted 3-0 Vicryl stitches.  The skin was then closed with a running 4-0 Monocryl subcuticular stitch.  Dermabond dressings were applied.  The patient tolerated the procedure well.  At the end of the case all needle sponge and instrument counts were correct.  The patient was then awakened and taken to recovery in stable condition.  PLAN OF CARE: Discharge to home after  PACU  PATIENT DISPOSITION:  PACU - hemodynamically stable.   Delay start of Pharmacological VTE agent (>24hrs) due to surgical blood loss or risk of bleeding: not applicable

## 2022-11-20 NOTE — Anesthesia Preprocedure Evaluation (Addendum)
Anesthesia Evaluation  Patient identified by MRN, date of birth, ID band Patient awake    Reviewed: Allergy & Precautions, NPO status , Patient's Chart, lab work & pertinent test results  Airway Mallampati: III  TM Distance: >3 FB Neck ROM: Full    Dental  (+) Dental Advisory Given, Partial Lower, Partial Upper   Pulmonary sleep apnea and Continuous Positive Airway Pressure Ventilation    Pulmonary exam normal breath sounds clear to auscultation       Cardiovascular hypertension, Pt. on medications + Peripheral Vascular Disease  Normal cardiovascular exam Rhythm:Regular Rate:Normal     Neuro/Psych  Headaches PSYCHIATRIC DISORDERS  Depression       GI/Hepatic Neg liver ROS,GERD  Controlled and Medicated,,  Endo/Other  diabetes (prediabetic)    Renal/GU negative Renal ROS  negative genitourinary   Musculoskeletal  (+) Arthritis , Osteoarthritis,    Abdominal  (+) + obese  Peds  Hematology negative hematology ROS (+)   Anesthesia Other Findings   Reproductive/Obstetrics negative OB ROS                             Anesthesia Physical Anesthesia Plan  ASA: 2  Anesthesia Plan: General and Regional   Post-op Pain Management: Tylenol PO (pre-op)* and Regional block*   Induction: Intravenous  PONV Risk Score and Plan: 3 and Ondansetron, Dexamethasone and Treatment may vary due to age or medical condition  Airway Management Planned: LMA  Additional Equipment: None  Intra-op Plan:   Post-operative Plan: Extubation in OR  Informed Consent: I have reviewed the patients History and Physical, chart, labs and discussed the procedure including the risks, benefits and alternatives for the proposed anesthesia with the patient or authorized representative who has indicated his/her understanding and acceptance.     Dental advisory given  Plan Discussed with: CRNA  Anesthesia Plan Comments:         Anesthesia Quick Evaluation

## 2022-11-20 NOTE — Anesthesia Procedure Notes (Signed)
Procedure Name: LMA Insertion Date/Time: 11/20/2022 12:47 PM  Performed by: Verita Lamb, CRNAPre-anesthesia Checklist: Patient identified, Emergency Drugs available, Suction available and Patient being monitored Patient Re-evaluated:Patient Re-evaluated prior to induction Oxygen Delivery Method: Circle system utilized Preoxygenation: Pre-oxygenation with 100% oxygen Induction Type: IV induction Ventilation: Mask ventilation without difficulty LMA: LMA inserted LMA Size: 4.0 Number of attempts: 1 Airway Equipment and Method: Bite block Placement Confirmation: positive ETCO2, CO2 detector and breath sounds checked- equal and bilateral Tube secured with: Tape Dental Injury: Teeth and Oropharynx as per pre-operative assessment

## 2022-11-20 NOTE — Transfer of Care (Signed)
Immediate Anesthesia Transfer of Care Note  Patient: Pamela Hendricks  Procedure(s) Performed: RIGHT BREAST LUMPECTOMY WITH RADIOACTIVE SEED AND SENTINEL LYMPH NODE BIOPSY (Right: Breast)  Patient Location: PACU  Anesthesia Type:General  Level of Consciousness: awake, alert , and oriented  Airway & Oxygen Therapy: Patient Spontanous Breathing and Patient connected to face mask oxygen  Post-op Assessment: Report given to RN and Post -op Vital signs reviewed and stable  Post vital signs: Reviewed and stable  Last Vitals:  Vitals Value Taken Time  BP 167/70 11/20/22 1409  Temp    Pulse 84 11/20/22 1411  Resp 17 11/20/22 1411  SpO2 97 % 11/20/22 1411  Vitals shown include unvalidated device data.  Last Pain:  Vitals:   11/20/22 1126  TempSrc: Oral  PainSc: 0-No pain         Complications: No notable events documented.

## 2022-11-20 NOTE — H&P (Signed)
REFERRING PHYSICIAN: Rulon Eisenmenger, MD  PROVIDER: Landry Corporal, MD  MRN: [id number] DOB: [birth year] Subjective   Chief Complaint: Breast Cancer   History of Present Illness: Pamela Hendricks is a 76 y.o. female who is seen today as an office consultation for evaluation of Breast Cancer .   We are asked to see the patient in consultation by Dr. Lindi Adie to evaluate her for a new right breast cancer. The patient is a 77 year old white female who recently went for a routine screening mammogram. At that time she was found to have a 1.7 cm area of calcification in the upper outer quadrant of the right breast. This was biopsied and came back as a grade 2 invasive ductal cancer that was ER and PR positive and HER2 negative with a Ki-67 of 10%.. She also had a 3 mm area of retroareolar calcification that was biopsied and benign. She is otherwise in good health and does not smoke.  Review of Systems: A complete review of systems was obtained from the patient. I have reviewed this information and discussed as appropriate with the patient. See HPI as well for other ROS.  ROS   Medical History: Past Medical History:  Diagnosis Date  Anxiety  Arthritis  Diabetes mellitus without complication (CMS-HCC)  GERD (gastroesophageal reflux disease)  History of cancer  Hyperlipidemia  Hypertension  Liver disease  Sleep apnea   Patient Active Problem List  Diagnosis  Prolapsed internal hemorrhoids, grade 4  Internal hemorrhoid, bleeding  Difficulty urinating  OSA on CPAP  Carotid artery plaque, bilateral  Chronic constipation  History of colonic polyps  Abnormal mammogram  Essential hypertension  Malignant neoplasm of upper-outer quadrant of right breast in female, estrogen receptor positive   Past Surgical History:  Procedure Laterality Date  Nasal Sinus Surgery N/A 09/30/2019  HEMORRHOIDECTOMY EXTERNAL N/A 09/18/2022  .Breast Biopsy N/A 10/10/2022  HYSTERECTOMY    Allergies   Allergen Reactions  Other Unknown  Watery eyes , itching - Grass, Rag Weed, Mold, Smoke  Cigarette Smoke Other (See Comments)  Grass Pollen Other (See Comments)  Mold Cough and Other (See Comments)  Pollen Extracts Other (See Comments)   Current Outpatient Medications on File Prior to Visit  Medication Sig Dispense Refill  estradioL (ESTRACE) 0.01 % (0.1 mg/gram) vaginal cream Place 0.5 g vaginally twice a week  oxyBUTYnin (DITROPAN) 5 mg tablet Take 5 mg by mouth 3 (three) times daily  acetaminophen (TYLENOL) 500 MG tablet Take by mouth  aspirin 81 MG EC tablet Take 81 mg by mouth once daily  atorvastatin (LIPITOR) 10 MG tablet Take 1 tablet by mouth once daily  benazepriL (LOTENSIN) 40 MG tablet 1 tablet Orally Once a day for 30 day(s)  benazepriL (LOTENSIN) 40 MG tablet Take 1 tablet by mouth once daily  celecoxib (CELEBREX) 200 MG capsule TAKE 1 CAPSULE BY MOUTH EVERY DAY WITH FOOD AS NEEDED  clobetasoL (TEMOVATE) 0.05 % ointment Apply 1 Application topically 2 (two) times daily  fluocinonide (LIDEX) 0.05 % cream Apply 1 Application topically 2 (two) times daily Apply externally under the Breast twice daily  fluticasone propionate (FLONASE) 50 mcg/actuation nasal spray 1 spray in each nostril Nasally Once a day for 30 day(s)  hydroCHLOROthiazide (HYDRODIURIL) 25 MG tablet 1 tablet in the morning Orally Once a day for 30 day(s)  loratadine (CLARITIN) 10 mg tablet Take by mouth  multivitamin with minerals tablet Take 1 tablet by mouth once daily  nortriptyline (PAMELOR) 25 MG capsule TAKE 2 CAPSULES BY  MOUTH AT BEDTIME. MAY REQUEST FROM PCP  pantoprazole (PROTONIX) 40 MG DR tablet Take 1 tablet by mouth once daily   No current facility-administered medications on file prior to visit.   Family History  Problem Relation Age of Onset  High blood pressure (Hypertension) Mother  Myocardial Infarction (Heart attack) Father  Colon cancer Brother  Obesity Brother    Social History    Tobacco Use  Smoking Status Never  Smokeless Tobacco Never    Social History   Socioeconomic History  Marital status: Widowed  Tobacco Use  Smoking status: Never  Smokeless tobacco: Never  Vaping Use  Vaping Use: Never used  Substance and Sexual Activity  Alcohol use: Not Currently  Drug use: Never   Objective:  There were no vitals filed for this visit.  There is no height or weight on file to calculate BMI.  Physical Exam Vitals reviewed.  Constitutional:  General: She is not in acute distress. Appearance: Normal appearance.  HENT:  Head: Normocephalic and atraumatic.  Right Ear: External ear normal.  Left Ear: External ear normal.  Nose: Nose normal.  Mouth/Throat:  Mouth: Mucous membranes are moist.  Pharynx: Oropharynx is clear.  Eyes:  General: No scleral icterus. Extraocular Movements: Extraocular movements intact.  Conjunctiva/sclera: Conjunctivae normal.  Pupils: Pupils are equal, round, and reactive to light.  Cardiovascular:  Rate and Rhythm: Normal rate and regular rhythm.  Pulses: Normal pulses.  Heart sounds: Normal heart sounds.  Pulmonary:  Effort: Pulmonary effort is normal. No respiratory distress.  Breath sounds: Normal breath sounds.  Abdominal:  General: Bowel sounds are normal.  Palpations: Abdomen is soft.  Tenderness: There is no abdominal tenderness.  Musculoskeletal:  General: No swelling, tenderness or deformity. Normal range of motion.  Cervical back: Normal range of motion and neck supple.  Skin: General: Skin is warm and dry.  Coloration: Skin is not jaundiced.  Neurological:  General: No focal deficit present.  Mental Status: She is alert and oriented to person, place, and time.  Psychiatric:  Mood and Affect: Mood normal.  Behavior: Behavior normal.     Breast: There is no palpable mass in either breast. There is no palpable axillary, supraclavicular, or cervical lymphadenopathy.  Labs, Imaging and Diagnostic  Testing:  Assessment and Plan:   Diagnoses and all orders for this visit:  Malignant neoplasm of upper-outer quadrant of right breast in female, estrogen receptor positive  - [facility] Case Posting Request; Future    The patient appears to have a 1.7 cm area of invasive breast cancer in the upper outer quadrant of the right breast with clinically negative nodes and all favorable markers. I have discussed with her in detail the different options for treatment and at this point she favors breast conservation which I feel is very reasonable. She is also a good candidate for sentinel node mapping as well. I have discussed with her in detail the risk and benefits of the operation as well as some of the technical aspects including use of a radioactive seed for localization and she understands and wishes to proceed. She will meet with medical and radiation oncology to discuss adjuvant therapy. We will begin surgical planning.

## 2022-11-20 NOTE — Discharge Instructions (Signed)
  Post Anesthesia Home Care Instructions  Activity: Get plenty of rest for the remainder of the day. A responsible individual must stay with you for 24 hours following the procedure.  For the next 24 hours, DO NOT: -Drive a car -Paediatric nurse -Drink alcoholic beverages -Take any medication unless instructed by your physician -Make any legal decisions or sign important papers.  Meals: Start with liquid foods such as gelatin or soup. Progress to regular foods as tolerated. Avoid greasy, spicy, heavy foods. If nausea and/or vomiting occur, drink only clear liquids until the nausea and/or vomiting subsides. Call your physician if vomiting continues.  Special Instructions/Symptoms: Your throat may feel dry or sore from the anesthesia or the breathing tube placed in your throat during surgery. If this causes discomfort, gargle with warm salt water. The discomfort should disappear within 24 hours.  If you had a scopolamine patch placed behind your ear for the management of post- operative nausea and/or vomiting:  1. The medication in the patch is effective for 72 hours, after which it should be removed.  Wrap patch in a tissue and discard in the trash. Wash hands thoroughly with soap and water. 2. You may remove the patch earlier than 72 hours if you experience unpleasant side effects which may include dry mouth, dizziness or visual disturbances. 3. Avoid touching the patch. Wash your hands with soap and water after contact with the patch.    NO Tylenol until after 5:30pm today.  Information for Discharge Teaching: EXPAREL (bupivacaine liposome injectable suspension)   Your surgeon or anesthesiologist gave you EXPAREL(bupivacaine) to help control your pain after surgery.  EXPAREL is a local anesthetic that provides pain relief by numbing the tissue around the surgical site. EXPAREL is designed to release pain medication over time and can control pain for up to 72 hours. Depending on how  you respond to EXPAREL, you may require less pain medication during your recovery.  Possible side effects: Temporary loss of sensation or ability to move in the area where bupivacaine was injected. Nausea, vomiting, constipation Rarely, numbness and tingling in your mouth or lips, lightheadedness, or anxiety may occur. Call your doctor right away if you think you may be experiencing any of these sensations, or if you have other questions regarding possible side effects.  Follow all other discharge instructions given to you by your surgeon or nurse. Eat a healthy diet and drink plenty of water or other fluids.  If you return to the hospital for any reason within 96 hours following the administration of EXPAREL, it is important for health care providers to know that you have received this anesthetic. A teal colored band has been placed on your arm with the date, time and amount of EXPAREL you have received in order to alert and inform your health care providers. Please leave this armband in place for the full 96 hours following administration, and then you may remove the band.

## 2022-11-20 NOTE — Progress Notes (Signed)
Assisted Dr. Finucane with right, pectoralis, ultrasound guided block. Side rails up, monitors on throughout procedure. See vital signs in flow sheet. Tolerated Procedure well. 

## 2022-11-21 ENCOUNTER — Encounter (HOSPITAL_BASED_OUTPATIENT_CLINIC_OR_DEPARTMENT_OTHER): Payer: Self-pay | Admitting: General Surgery

## 2022-11-25 LAB — SURGICAL PATHOLOGY

## 2022-11-27 ENCOUNTER — Encounter (HOSPITAL_COMMUNITY): Payer: Self-pay

## 2022-12-01 ENCOUNTER — Inpatient Hospital Stay: Payer: 59 | Attending: Hematology and Oncology | Admitting: Hematology and Oncology

## 2022-12-01 ENCOUNTER — Other Ambulatory Visit: Payer: Self-pay

## 2022-12-01 ENCOUNTER — Encounter: Payer: Self-pay | Admitting: *Deleted

## 2022-12-01 VITALS — BP 147/59 | HR 78 | Temp 97.9°F | Resp 18 | Ht 65.0 in | Wt 197.8 lb

## 2022-12-01 DIAGNOSIS — C50411 Malignant neoplasm of upper-outer quadrant of right female breast: Secondary | ICD-10-CM | POA: Diagnosis not present

## 2022-12-01 DIAGNOSIS — Z7982 Long term (current) use of aspirin: Secondary | ICD-10-CM | POA: Diagnosis not present

## 2022-12-01 DIAGNOSIS — Z170 Estrogen receptor positive status [ER+]: Secondary | ICD-10-CM | POA: Diagnosis not present

## 2022-12-01 DIAGNOSIS — Z79899 Other long term (current) drug therapy: Secondary | ICD-10-CM | POA: Insufficient documentation

## 2022-12-01 MED ORDER — ANASTROZOLE 1 MG PO TABS: 1.0000 mg | ORAL_TABLET | Freq: Every day | ORAL | 3 refills | Status: DC

## 2022-12-01 NOTE — Progress Notes (Signed)
Patient Care Team: Leeroy Cha, MD as PCP - General (Internal Medicine) Jerline Pain, MD as PCP - Cardiology (Cardiology) Michael Boston, MD as Consulting Physician (General Surgery) Hennie Duos, MD as Consulting Physician (Rheumatology) Debbora Presto, NP as Nurse Practitioner (Neurology) Melrose Nakayama, MD as Consulting Physician (Orthopedic Surgery) Rockwell Germany, RN as Oncology Nurse Navigator Mauro Kaufmann, RN as Oncology Nurse Navigator Nicholas Lose, MD as Consulting Physician (Hematology and Oncology) Jovita Kussmaul, MD as Consulting Physician (General Surgery) Eppie Gibson, MD as Attending Physician (Radiation Oncology)  DIAGNOSIS:  Encounter Diagnosis  Name Primary?   Malignant neoplasm of upper-outer quadrant of right breast in female, estrogen receptor positive ([place]) Yes    SUMMARY OF ONCOLOGIC HISTORY: Oncology History  Malignant neoplasm of upper-outer quadrant of right breast in female, estrogen receptor positive ([place])  10/13/2022 Initial Diagnosis   Screening mammogram detected right breast calcifications 1.7 cm span (history of CSL), additional 0.3 cm retroareolar calcifications within biopsy were benign and concordant.  Biopsy: Grade 2 IDC with DCIS ER 100%, PR 95%, HER2 1+ negative, Ki-67 10%   10/22/2022 Cancer Staging   Staging form: Breast, AJCC 8th Edition - Clinical: Stage IA (cT1c, cN0, cM0, G2, ER+, PR+, HER2-) - Signed by Nicholas Lose, MD on 10/22/2022 Stage prefix: Initial diagnosis Histologic grading system: 3 grade system   11/20/2022 Surgery   Right lumpectomy: Grade 2 IDC 1.7 cm, margins negative, ER 100%, PR 95%, HER2 negative, Ki-67 10%     CHIEF COMPLIANT: Follow-up after surgery  INTERVAL HISTORY: Pamela Hendricks is a 77 y.o. female is here because of recent diagnosis of right breast cancer. She presents to the clinic for a follow-up after surgery. She reports surgery went well with no complaints or  concerns.    ALLERGIES:  is allergic to other, grass pollen(k-o-r-t-swt vern), molds & smuts, and pollen extract.  MEDICATIONS:  Current Outpatient Medications  Medication Sig Dispense Refill   acetaminophen (TYLENOL) 500 MG tablet Take 1,000 mg by mouth every 6 (six) hours as needed for mild pain or headache.     anastrozole (ARIMIDEX) 1 MG tablet Take 1 tablet (1 mg total) by mouth daily. 90 tablet 3   aspirin EC 81 MG tablet Take 81 mg by mouth daily.     atorvastatin (LIPITOR) 10 MG tablet Take 10 mg by mouth at bedtime.     benazepril (LOTENSIN) 40 MG tablet Take 40 mg by mouth daily.      benazepril (LOTENSIN) 40 MG tablet Take 1 tablet by mouth daily.     Calcium Carb-Cholecalciferol (CALCIUM 600+D3 PO) Take 1 tablet by mouth 2 (two) times daily.     celecoxib (CELEBREX) 200 MG capsule Take 200 mg by mouth daily.     clobetasol ointment (TEMOVATE) 1.95 % Apply 1 application topically 2 (two) times daily.     estradiol (ESTRACE) 0.1 MG/GM vaginal cream Place 0.5 Applicatorfuls vaginally at bedtime.     fluocinonide cream (LIDEX) 0.93 % Apply 1 Application topically daily.     fluticasone (FLONASE) 50 MCG/ACT nasal spray Place 1 spray into both nostrils daily.     hydrochlorothiazide (HYDRODIURIL) 25 MG tablet Take 25 mg by mouth daily.     loratadine (CLARITIN) 10 MG tablet Take 10 mg by mouth daily.     Multiple Vitamins-Minerals (MULTIVITAMIN WITH MINERALS) tablet Take 1 tablet by mouth daily.     NON FORMULARY Pt uses a cpap nightly     nortriptyline (PAMELOR) 25 MG  capsule TAKE 2 CAPSULES(50 MG) BY MOUTH AT BEDTIME 180 capsule 4   oxybutynin (DITROPAN) 5 MG tablet Take 5 mg by mouth 3 (three) times daily.     oxyCODONE (ROXICODONE) 5 MG immediate release tablet Take 1 tablet (5 mg total) by mouth every 6 (six) hours as needed for severe pain. 10 tablet 0   oxymetazoline (AFRIN) 0.05 % nasal spray Place 1 spray into both nostrils 2 (two) times daily.     pantoprazole (PROTONIX)  40 MG tablet Take 40 mg by mouth daily.      Polyethyl Glycol-Propyl Glycol (SYSTANE) 0.4-0.3 % SOLN Place 1 drop into both eyes in the morning.     polyethylene glycol (MIRALAX / GLYCOLAX) 17 g packet Take 17 g by mouth daily.     oxyCODONE (OXY IR/ROXICODONE) 5 MG immediate release tablet Take 1 tablet (5 mg total) by mouth every 6 (six) hours as needed for severe pain or breakthrough pain. (Patient not taking: Reported on 12/01/2022) 30 tablet 0   No current facility-administered medications for this visit.    PHYSICAL EXAMINATION: ECOG PERFORMANCE STATUS: 1 - Symptomatic but completely ambulatory  Vitals:   12/01/22 1026  BP: (!) 147/59  Pulse: 78  Resp: 18  Temp: 97.9 F (36.6 C)  SpO2: 100%   Filed Weights   12/01/22 1026  Weight: 197 lb 12.8 oz (89.7 kg)      LABORATORY DATA:  I have reviewed the data as listed    Latest Ref Rng & Units 10/22/2022   12:00 PM 09/17/2022    8:29 AM 01/16/2022    9:16 AM  CMP  Glucose 70 - 99 mg/dL 93  90    BUN 8 - 23 mg/dL 15  24    Creatinine 0.44 - 1.00 mg/dL 0.67  0.67    Sodium 135 - 145 mmol/L 139  138    Potassium 3.5 - 5.1 mmol/L 3.8  3.9    Chloride 98 - 111 mmol/L 101  102    CO2 22 - 32 mmol/L 30  28    Calcium 8.9 - 10.3 mg/dL 10.0  9.1    Total Protein 6.5 - 8.1 g/dL 6.9     Total Bilirubin 0.3 - 1.2 mg/dL 0.5     Alkaline Phos 38 - 126 U/L 64     AST 15 - 41 U/L 26     ALT 0 - 44 U/L 29   59     Lab Results  Component Value Date   WBC 10.8 (H) 10/22/2022   HGB 12.4 10/22/2022   HCT 37.5 10/22/2022   MCV 94.7 10/22/2022   PLT 267 10/22/2022   NEUTROABS 7.9 (H) 10/22/2022    ASSESSMENT & PLAN:  Malignant neoplasm of upper-outer quadrant of right breast in female, estrogen receptor positive ([place]) 11/20/2022:Right lumpectomy: Grade 2 IDC 1.7 cm, margins negative, ER 100%, PR 95%, HER2 negative, Ki-67 10%  Pathology counseling: I discussed the final pathology report of the patient provided  a copy of this  report. I discussed the margins as well as lymph node surgeries. We also discussed the final staging along with previously performed ER/PR and HER-2/neu testing.  Treatment plan: +/- Adjuvant radiation Adjuvant antiestrogen therapy  Return to clinic in 3 months for survivorship care plan visit and after that she can be seen annually by me.     No orders of the defined types were placed in this encounter.  The patient has a good understanding of the overall  plan. she agrees with it. she will call with any problems that may develop before the next visit here. Total time spent: 30 mins including face to face time and time spent for planning, charting and co-ordination of care   Harriette Ohara, MD 12/01/22    I Gardiner Coins am acting as a Education administrator for [employer]  I have reviewed the above documentation for accuracy and completeness, and I agree with the above.

## 2022-12-01 NOTE — Assessment & Plan Note (Signed)
11/20/2022:Right lumpectomy: Grade 2 IDC 1.7 cm, margins negative, ER 100%, PR 95%, HER2 negative, Ki-67 10%  Pathology counseling: I discussed the final pathology report of the patient provided  a copy of this report. I discussed the margins as well as lymph node surgeries. We also discussed the final staging along with previously performed ER/PR and HER-2/neu testing.  Treatment plan: +/- Adjuvant radiation Adjuvant antiestrogen therapy  Return to clinic in 3 months for survivorship care plan visit

## 2022-12-09 ENCOUNTER — Telehealth: Payer: Self-pay | Admitting: Cardiology

## 2022-12-09 ENCOUNTER — Other Ambulatory Visit: Payer: Self-pay

## 2022-12-09 ENCOUNTER — Emergency Department (HOSPITAL_COMMUNITY): Payer: 59

## 2022-12-09 ENCOUNTER — Emergency Department (HOSPITAL_COMMUNITY)
Admission: EM | Admit: 2022-12-09 | Discharge: 2022-12-09 | Disposition: A | Discharge status: Home / Self Care | Payer: 59 | Attending: Emergency Medicine | Admitting: Emergency Medicine

## 2022-12-09 DIAGNOSIS — Z853 Personal history of malignant neoplasm of breast: Secondary | ICD-10-CM | POA: Diagnosis not present

## 2022-12-09 DIAGNOSIS — N3289 Other specified disorders of bladder: Secondary | ICD-10-CM | POA: Diagnosis not present

## 2022-12-09 DIAGNOSIS — R079 Chest pain, unspecified: Secondary | ICD-10-CM | POA: Diagnosis not present

## 2022-12-09 DIAGNOSIS — Z7982 Long term (current) use of aspirin: Secondary | ICD-10-CM | POA: Diagnosis not present

## 2022-12-09 DIAGNOSIS — I10 Essential (primary) hypertension: Secondary | ICD-10-CM | POA: Insufficient documentation

## 2022-12-09 DIAGNOSIS — R0789 Other chest pain: Secondary | ICD-10-CM | POA: Insufficient documentation

## 2022-12-09 DIAGNOSIS — Z79899 Other long term (current) drug therapy: Secondary | ICD-10-CM | POA: Diagnosis not present

## 2022-12-09 DIAGNOSIS — M549 Dorsalgia, unspecified: Secondary | ICD-10-CM | POA: Insufficient documentation

## 2022-12-09 DIAGNOSIS — M47814 Spondylosis without myelopathy or radiculopathy, thoracic region: Secondary | ICD-10-CM | POA: Diagnosis not present

## 2022-12-09 DIAGNOSIS — K5730 Diverticulosis of large intestine without perforation or abscess without bleeding: Secondary | ICD-10-CM | POA: Diagnosis not present

## 2022-12-09 DIAGNOSIS — Z743 Need for continuous supervision: Secondary | ICD-10-CM | POA: Diagnosis not present

## 2022-12-09 DIAGNOSIS — N281 Cyst of kidney, acquired: Secondary | ICD-10-CM | POA: Diagnosis not present

## 2022-12-09 DIAGNOSIS — R918 Other nonspecific abnormal finding of lung field: Secondary | ICD-10-CM | POA: Diagnosis not present

## 2022-12-09 DIAGNOSIS — I2510 Atherosclerotic heart disease of native coronary artery without angina pectoris: Secondary | ICD-10-CM | POA: Diagnosis not present

## 2022-12-09 DIAGNOSIS — I700 Atherosclerosis of aorta: Secondary | ICD-10-CM | POA: Diagnosis not present

## 2022-12-09 DIAGNOSIS — R0602 Shortness of breath: Secondary | ICD-10-CM | POA: Insufficient documentation

## 2022-12-09 LAB — BASIC METABOLIC PANEL
Anion gap: 12 (ref 5–15)
BUN: 12 mg/dL (ref 8–23)
CO2: 26 mmol/L (ref 22–32)
Calcium: 9.5 mg/dL (ref 8.9–10.3)
Chloride: 99 mmol/L (ref 98–111)
Creatinine, Ser: 0.75 mg/dL (ref 0.44–1.00)
GFR, Estimated: >60 mL/min (ref >60)
Glucose, Bld: 97 mg/dL (ref 70–99)
Potassium: 3.6 mmol/L (ref 3.5–5.1)
Sodium: 137 mmol/L (ref 135–145)

## 2022-12-09 LAB — TROPONIN I (HIGH SENSITIVITY)
Troponin I (High Sensitivity): 6 ng/L (ref <18)
Troponin I (High Sensitivity): 8 ng/L (ref <18)

## 2022-12-09 LAB — CBC
HCT: 41.1 % (ref 36.0–46.0)
Hemoglobin: 13.1 g/dL (ref 12.0–15.0)
MCH: 31 pg (ref 26.0–34.0)
MCHC: 31.9 g/dL (ref 30.0–36.0)
MCV: 97.2 fL (ref 80.0–100.0)
Platelets: 279 10*3/uL (ref 150–400)
RBC: 4.23 MIL/uL (ref 3.87–5.11)
RDW: 12.3 % (ref 11.5–15.5)
WBC: 8.3 10*3/uL (ref 4.0–10.5)
nRBC: 0 % (ref 0.0–0.2)

## 2022-12-09 LAB — HEPATIC FUNCTION PANEL
ALT: 36 U/L (ref 0–44)
AST: 36 U/L (ref 15–41)
Albumin: 4.2 g/dL (ref 3.5–5.0)
Alkaline Phosphatase: 73 U/L (ref 38–126)
Bilirubin, Direct: 0.2 mg/dL (ref 0.0–0.2)
Indirect Bilirubin: 0.6 mg/dL (ref 0.3–0.9)
Total Bilirubin: 0.8 mg/dL (ref 0.3–1.2)
Total Protein: 7.8 g/dL (ref 6.5–8.1)

## 2022-12-09 LAB — LIPASE, BLOOD: Lipase: 29 U/L (ref 11–51)

## 2022-12-09 MED ORDER — METOCLOPRAMIDE HCL 5 MG/ML IJ SOLN
5.0000 mg | Freq: Once | INTRAMUSCULAR | Status: AC
  Administered 2022-12-09: 5 mg via INTRAVENOUS
  Filled 2022-12-09: qty 2

## 2022-12-09 MED ORDER — ALUM & MAG HYDROXIDE-SIMETH 200-200-20 MG/5ML PO SUSP
30.0000 mL | Freq: Once | ORAL | Status: AC
  Administered 2022-12-09: 30 mL via ORAL
  Filled 2022-12-09: qty 30

## 2022-12-09 MED ORDER — MAALOX MAX 400-400-40 MG/5ML PO SUSP: 5.0000 mL | Freq: Four times a day (QID) | ORAL | 0 refills | Status: DC | PRN

## 2022-12-09 MED ORDER — OMEPRAZOLE 20 MG PO CPDR: 20.0000 mg | DELAYED_RELEASE_CAPSULE | Freq: Every day | ORAL | 0 refills | Status: DC

## 2022-12-09 MED ORDER — PANTOPRAZOLE SODIUM 40 MG IV SOLR
40.0000 mg | Freq: Once | INTRAVENOUS | Status: AC
  Administered 2022-12-09: 40 mg via INTRAVENOUS
  Filled 2022-12-09: qty 10

## 2022-12-09 MED ORDER — IOHEXOL 350 MG/ML SOLN
100.0000 mL | Freq: Once | INTRAVENOUS | Status: AC | PRN
  Administered 2022-12-09: 100 mL via INTRAVENOUS

## 2022-12-09 NOTE — Discharge Instructions (Signed)
You were seen in the emergency department today for chest pain. You workup did not reveal a definite cause of your symptoms but was generally reassuring including your EKG, CT scan and bloodwork.  Return to the emergency department immediately if you develop recurrent, worsening severe chest pain, ainting spells, sudden sweatiness, or any other concerning symptoms.   Please also make an appointment to follow up with your primary care doctor or cardiologist and GI doctor within one week to assure improvement or resolution in symptoms. Further testing may be necessary, so it is extremely important to keep your follow-up appointment with your primary doctor.

## 2022-12-09 NOTE — ED Triage Notes (Signed)
Pt from PCP office for further eval of chest pain with radiation through to back since Sunday. Pain has been constant since onset and is so painful that she is unable to swallow.

## 2022-12-09 NOTE — ED Provider Notes (Signed)
[facility] Provider Note   CSN: [id number] Arrival date & time: 12/09/22  1236     History  Chief Complaint  Patient presents with   Chest Pain   Back Pain    Pamela Hendricks is a 77 y.o. female. With pmh HTN, HLD, GERD, breast cancer s/p right lumpectomy 11/20/22 who presents with ongoing chest pain and discomfort that is substernal with some radiation to the back for the past 3 to 4 days.  It has been nonexertional but associated with some shortness of breath.  She has had no leg pain or swelling or history of PE, DVT or MI.  She is just recently started her antiestrogen medication for breast cancer.  Her right lumpectomy site has been healing well, she has had no erythema, drainage or fevers or discomfort at that site.  She complains of a Hendricks grip feeling in her chest that is substernal but some radiation to the back with associated pain with swallowing this morning in extreme discomfort.  She has had no vomiting or diarrhea.  She has not taken anything for the pain other than Tylenol.  She does have history of GERD as well as esophageal stricture that has required dilation in the past by her GI doctor.   Chest Pain Associated symptoms: back pain   Back Pain Associated symptoms: chest pain        Home Medications Prior to Admission medications   Medication Sig Start Date End Date Taking? Authorizing Provider  alum & mag hydroxide-simeth (MAALOX MAX) 400-400-40 MG/5ML suspension Take 5 mLs by mouth every 6 (six) hours as needed for indigestion. 12/09/22  Yes Elgie Congo, MD  omeprazole (PRILOSEC) 20 MG capsule Take 1 capsule (20 mg total) by mouth daily. 12/09/22  Yes Elgie Congo, MD  acetaminophen (TYLENOL) 500 MG tablet Take 1,000 mg by mouth every 6 (six) hours as needed for mild pain or headache.    [provider]  anastrozole (ARIMIDEX) 1 MG tablet Take 1 tablet (1 mg total) by mouth daily. 12/01/22    Nicholas Lose, MD  aspirin EC 81 MG tablet Take 81 mg by mouth daily.    [provider]  atorvastatin (LIPITOR) 10 MG tablet Take 10 mg by mouth at bedtime. 07/28/22   [provider]  benazepril (LOTENSIN) 40 MG tablet Take 40 mg by mouth daily.  06/01/12   [provider]  benazepril (LOTENSIN) 40 MG tablet Take 1 tablet by mouth daily.    [provider]  Calcium Carb-Cholecalciferol (CALCIUM 600+D3 PO) Take 1 tablet by mouth 2 (two) times daily.    [provider]  celecoxib (CELEBREX) 200 MG capsule Take 200 mg by mouth daily. 09/15/19   [provider]  clobetasol ointment (TEMOVATE) 9.47 % Apply 1 application topically 2 (two) times daily.    [provider]  estradiol (ESTRACE) 0.1 MG/GM vaginal cream Place 0.5 Applicatorfuls vaginally at bedtime. 09/04/22   [provider]  fluocinonide cream (LIDEX) 0.96 % Apply 1 Application topically daily. 09/28/19   [provider]  fluticasone (FLONASE) 50 MCG/ACT nasal spray Place 1 spray into both nostrils daily.    [provider]  hydrochlorothiazide (HYDRODIURIL) 25 MG tablet Take 25 mg by mouth daily. 06/28/21   [provider]  loratadine (CLARITIN) 10 MG tablet Take 10 mg by mouth daily.    [provider]  Multiple Vitamins-Minerals (MULTIVITAMIN WITH MINERALS) tablet Take 1 tablet by  mouth daily.    [provider]  NON FORMULARY Pt uses a cpap nightly    [provider]  nortriptyline (PAMELOR) 25 MG capsule TAKE 2 CAPSULES(50 MG) BY MOUTH AT BEDTIME 09/29/22   Marcial Pacas, MD  oxybutynin (DITROPAN) 5 MG tablet Take 5 mg by mouth 3 (three) times daily. 09/04/22   [provider]  oxyCODONE (OXY IR/ROXICODONE) 5 MG immediate release tablet Take 1 tablet (5 mg total) by mouth every 6 (six) hours as needed for severe pain or breakthrough pain. Patient not taking: Reported on 12/01/2022 09/18/22   Michael Boston, MD   oxyCODONE (ROXICODONE) 5 MG immediate release tablet Take 1 tablet (5 mg total) by mouth every 6 (six) hours as needed for severe pain. 11/20/22   Jovita Kussmaul, MD  oxymetazoline (AFRIN) 0.05 % nasal spray Place 1 spray into both nostrils 2 (two) times daily.    [provider]  pantoprazole (PROTONIX) 40 MG tablet Take 40 mg by mouth daily.     [provider]  Polyethyl Glycol-Propyl Glycol (SYSTANE) 0.4-0.3 % SOLN Place 1 drop into both eyes in the morning.    [provider]  polyethylene glycol (MIRALAX / GLYCOLAX) 17 g packet Take 17 g by mouth daily.    [provider]      Allergies    Other, Grass pollen(k-o-r-t-swt vern), Molds & smuts, and Pollen extract    Review of Systems   Review of Systems  Cardiovascular:  Positive for chest pain.  Musculoskeletal:  Positive for back pain.    Physical Exam Updated Vital Signs BP (!) 124/56   Pulse 76   Temp 97.6 F (36.4 C) (Oral)   Resp 19   SpO2 98%  Physical Exam Constitutional: Alert and oriented. Well appearing and in no distress. Eyes: Conjunctivae are normal. ENT      Mouth/Throat: Mucous membranes are moist.      Neck: No stridor. Cardiovascular: S1, S2, regular rate, Normal and symmetric distal pulses are present in all extremities.Warm and well perfused. Chest wall with no tenderness to palpation, right breast lumpectomy site well-healing clean dry and intact. Respiratory: Normal respiratory effort. Breath sounds are normal.  O2 sat 100% on room air. Gastrointestinal: Soft and nontender.  Musculoskeletal: Normal range of motion in all extremities. No pitting edema of lower extremities, no erythema Neurologic: Normal speech and language.  No facial droop.  Moving all extremities equally.  Sensation grossly intact.  No gross focal neurologic deficits are appreciated. Skin: Skin is warm, dry and intact. No rash noted. Psychiatric: Mood and affect are normal. Speech and behavior are  normal.  ED Results / Procedures / Treatments   Labs (all labs ordered are listed, but only abnormal results are displayed) Labs Reviewed  BASIC METABOLIC PANEL  CBC  HEPATIC FUNCTION PANEL  LIPASE, BLOOD  TROPONIN I (HIGH SENSITIVITY)  TROPONIN I (HIGH SENSITIVITY)    EKG EKG Interpretation  Date/Time:  Tuesday December 09 2022 18:47:30 EST Ventricular Rate:  76 PR Interval:  172 QRS Duration: 98 QT Interval:  383 QTC Calculation: 431 R Axis:   1 Text Interpretation: Sinus rhythm no changes Confirmed by Georgina Snell [phone]) on 12/09/2022 6:59:56 PM  Radiology CT Angio Chest/Abd/Pel for Dissection W and/or Wo Contrast  Result Date: 12/09/2022 CLINICAL DATA:  Chest and back pain for several days, initial encounter EXAM: CT ANGIOGRAPHY CHEST, ABDOMEN AND PELVIS TECHNIQUE: Non-contrast CT of the chest was initially obtained. Multidetector CT imaging through the chest,  abdomen and pelvis was performed using the standard protocol during bolus administration of intravenous contrast. Multiplanar reconstructed images and MIPs were obtained and reviewed to evaluate the vascular anatomy. RADIATION DOSE REDUCTION: This exam was performed according to the departmental dose-optimization program which includes automated exposure control, adjustment of the mA and/or kV according to patient size and/or use of iterative reconstruction technique. CONTRAST:  119m OMNIPAQUE IOHEXOL 350 MG/ML SOLN COMPARISON:  Chest x-ray from earlier in the same day CT from 09/01/2014. FINDINGS: CTA CHEST FINDINGS Cardiovascular: Initial precontrast images demonstrate atherosclerotic calcifications of the thoracic aorta. No hyperdense crescent to suggest acute aortic injury is noted. Post-contrast images show again show atherosclerotic calcifications. No aneurysmal dilatation of the aorta is noted. No findings to suggest dissection are seen. Descending thoracic aorta is within normal limits. Heart is not significantly  enlarged in size. Mild coronary calcifications are noted. Pulmonary artery shows a normal branching pattern bilaterally. No intraluminal filling defect to suggest pulmonary embolism is noted. Mediastinum/Nodes: Thoracic inlet demonstrates a 10 mm hypodense nodule within the isthmus and medial aspect of the right lobe of the thyroid. No other sizable nodules are seen. No hilar or mediastinal adenopathy is noted. The esophagus is within normal limits. Lungs/Pleura: Lungs are well aerated bilaterally. No focal infiltrate or sizable effusion is seen. A few tiny parenchymal nodules are noted bilaterally which appear stable from prior exam from 2015. no sizable parenchymal nodules are seen. Musculoskeletal: No acute rib abnormality is noted. Degenerative changes of the thoracic spine are seen. Postsurgical changes are noted in the right breast. Review of the MIP images confirms the above findings. CTA ABDOMEN AND PELVIS FINDINGS VASCULAR Aorta: Atherosclerotic calcifications without aneurysmal dilatation or dissection. Celiac: Patent without evidence of aneurysm, dissection, vasculitis or significant stenosis. SMA: Patent without evidence of aneurysm, dissection, vasculitis or significant stenosis. Renals: Both renal arteries are patent without evidence of aneurysm, dissection, vasculitis, fibromuscular dysplasia or significant stenosis. IMA: Patent without evidence of aneurysm, dissection, vasculitis or significant stenosis. Inflow: Iliacs demonstrate atherosclerotic calcification without focal stenosis or aneurysmal dilatation. Veins: No specific venous abnormality is noted. Review of the MIP images confirms the above findings. NON-VASCULAR Hepatobiliary: No focal liver abnormality is seen. No gallstones, gallbladder wall thickening, or biliary dilatation. Pancreas: Unremarkable. No pancreatic ductal dilatation or surrounding inflammatory changes. Spleen: Normal in size without focal abnormality. Adrenals/Urinary Tract:  Adrenal glands are within normal limits. Kidneys demonstrate a normal enhancement pattern bilaterally. Subcentimeter cyst is noted within the right kidney. No follow-up is recommended. No obstructive changes are seen. The bladder is well distended. A small focus of air is noted within the bladder which may be related to recent instrumentation. Stomach/Bowel: Diverticular change of the colon is noted without evidence of diverticulitis. No obstructive or inflammatory changes of the colon are seen. The appendix is within normal limits. Small bowel and stomach are within normal limits. Lymphatic: No lymphadenopathy is identified. Reproductive: Status post hysterectomy. No adnexal masses. Other: No abdominal wall hernia or abnormality. No abdominopelvic ascites. Musculoskeletal: No acute or significant osseous findings. Review of the MIP images confirms the above findings. IMPRESSION: CTA of the chest: Normal appearing thoracic aorta. No dissection or aneurysmal dilatation is noted. No evidence of pulmonary emboli. 1 cm incidental right thyroid nodule. No follow-up imaging is recommended. Reference: J Am Coll Radiol. 2015 Feb;12(2): 143-50 CTA of the abdomen and pelvis: No abdominal aortic aneurysm is seen. Scattered atherosclerotic changes are noted. Diverticulosis without diverticulitis. No other focal abnormality is noted. Electronically Signed  By: Inez Catalina M.D.   On: 12/09/2022 17:17   DG Chest 2 View  Result Date: 12/09/2022 CLINICAL DATA:  Chest pain. EXAM: CHEST - 2 VIEW COMPARISON:  32,023 FINDINGS: The cardiac silhouette, mediastinal and hilar contours are normal. The lungs are clear. No pleural effusions or pulmonary lesions. The bony thorax is intact. Bilateral cervical ribs are noted. IMPRESSION: No acute cardiopulmonary findings. Electronically Signed   By: Marijo Sanes M.D.   On: 12/09/2022 13:34    Procedures Procedures  Remain on constant cardiac monitoring reviewed by me normal sinus  rhythm with normal rates.  Medications Ordered in ED Medications  alum & mag hydroxide-simeth (MAALOX/MYLANTA) 200-200-20 MG/5ML suspension 30 mL (30 mLs Oral Given 12/09/22 1613)  pantoprazole (PROTONIX) injection 40 mg (40 mg Intravenous Given 12/09/22 1614)  metoCLOPramide (REGLAN) injection 5 mg (5 mg Intravenous Given 12/09/22 1614)  iohexol (OMNIPAQUE) 350 MG/ML injection 100 mL (100 mLs Intravenous Contrast Given 12/09/22 1646)    ED Course/ Medical Decision Making/ A&P Clinical Course as of 12/09/22 1937  Tue Dec 09, 2022  1732 CTA dissection scan unremarkable.  No evidence of dissection, no evidence of PE, no evidence of biliary disease or pancreatitis.  No acute findings to be causing patient's pain. [VB]  1920 Repeat EKG without any acute changes, repeat troponin 8 generally flat, with heart score 4, I offered observation for patient however she has outpatient cardiology and GI doctor who she can follow-up with.  I believe this is a safe plan at this time with negative CTA dissection scan and well appearance.  Patient safe for discharge home at this time discussed tricked return precautions.  She will follow-up with outpatient cardiology and GI. [VB]    Clinical Course User Index [VB] Elgie Congo, MD   {                            Medical Decision Making  KADIJAH SHAMOON is a 77 y.o. female. With pmh HTN, HLD, GERD, breast cancer s/p right lumpectomy 11/20/22 who presents with ongoing chest pain and discomfort that is substernal with some radiation to the back for the past 3 to 4 days.   Regarding the patient's chest pain, their HEART score is 4 and overall have an EKG which is reassuring and unchanged from previous. Will further evaluate for ACS with high-sensitivity troponin at least 3 hours from the patient's start of pain, initial hs trop 6 reassuring and rpt trop 8.  Chest x-ray obtained and reviewed by me showed no evidence for pneumonia, pneumothorax, and pulmonary  edema. I do not think PE because patient breathing comfortably with no evidence of DVT on exam, no hypoxia, no hemoptysis.   CTA dissection scan obtained due to patient's location of pain and reported level of discomfort although well-appearing on exam with no pulse or neurologic deficits.  CTA dissection scan unremarkable.  No evidence of dissection, no evidence of PE, no evidence of biliary disease or pancreatitis.  No acute findings to be causing patient's pain. [VB]  Repeat EKG without any acute changes, repeat troponin 8 generally flat, with heart score 4, I offered observation for patient however she has outpatient cardiology and GI doctor who she can follow-up with.  I believe this is a safe plan at this time with negative CTA dissection scan and well appearance.  Patient safe for discharge home at this time discussed tricked return precautions.  She will follow-up  with outpatient cardiology and GI. [VB]    Amount and/or Complexity of Data Reviewed Labs: ordered. Radiology: ordered.  Risk OTC drugs. Prescription drug management.    Final Clinical Impression(s) / ED Diagnoses Final diagnoses:  Chest pain, unspecified type    Rx / DC Orders ED Discharge Orders          Ordered    Ambulatory referral to Cardiology       Comments: If you have not heard from the Cardiology office within the next 72 hours please call [phone].   12/09/22 1924    alum & mag hydroxide-simeth (MAALOX MAX) 400-400-40 MG/5ML suspension  Every 6 hours PRN        12/09/22 1924    omeprazole (PRILOSEC) 20 MG capsule  Daily        12/09/22 1924              Elgie Congo, MD 12/09/22 [phone]

## 2022-12-09 NOTE — ED Provider Triage Note (Signed)
Emergency Medicine Provider Triage Evaluation Note  Pamela Hendricks , a 77 y.o. female  was evaluated in triage.  Pt complains of chest tightness that started 3 days ago in the center of her chest that radiates to her left arm, around to her back, and up to her neck.  Patient states that pain has been constant since that time and is severe in nature.  No associated shortness of breath, nausea, vomiting, diaphoresis, lightheadedness, dizziness, syncope, cough, congestion, fever, chills or other symptoms.  Reports that she has never had this pain before.  Family history of cardiac disease in her daughter who is deceased.  Recent right-sided lumpectomy on 1/11, patient states area is healing well. No known history of metastatic cancer.   Review of Systems  Positive: See HPI Negative: See HPI  Physical Exam  BP (!) 146/63 (BP Location: Left Arm)   Pulse 75   Temp 98.5 F (36.9 C) (Oral)   Resp 18   SpO2 100%  Gen:   Awake, no distress   Resp:  Normal effort LCTA MSK:   Moves extremities without difficulty  Other:  Alert and oriented, nonfocal neuro exam, RRR  Medical Decision Making  Medically screening exam initiated at 12:56 PM.  Appropriate orders placed.  Pamela Hendricks was informed that the remainder of the evaluation will be completed by another provider, this initial triage assessment does not replace that evaluation, and the importance of remaining in the ED until their evaluation is complete.     Suzzette Righter, PA-C 12/09/22 1258

## 2022-12-09 NOTE — Telephone Encounter (Signed)
On Sunday she began experiencing chest pain, "a lot of pressure, radiating to my back and shoulder areas".  Says it feels like a gouging/stabbing pain. Monday reports same symptoms. Pain 9 out of 10 She also reports "terrible pain in my back when I swallow", it has been difficult trying to eat/swallow. Pt reports recent lumpectomy on 1/11. States she has spoken to surgeon's office who do not think it is related to recent surgery. Pt advised to call 911 or have someone take her to the ED right away for evaluation. Aware I will send to MD for his review of hospital visit today.

## 2022-12-09 NOTE — Telephone Encounter (Signed)
Pt c/o of Chest Pain: STAT if CP now or developed within 24 hours  1. Are you having CP right now?  Yes, in center of breast and shoulder/back  2. Are you experiencing any other symptoms (ex. SOB, nausea, vomiting, sweating)?  SOB, unable to swallow  3. How long have you been experiencing CP?  Started Sunday in middle of chest, yesterday pain spread to back and shoulder  4. Is your CP continuous or coming and going?  Continuous   5. Have you taken Nitroglycerin?   No  ?

## 2022-12-15 ENCOUNTER — Other Ambulatory Visit: Payer: Self-pay

## 2022-12-15 ENCOUNTER — Ambulatory Visit: Payer: 59 | Attending: General Surgery | Admitting: Physical Therapy

## 2022-12-15 ENCOUNTER — Encounter: Payer: Self-pay | Admitting: Physical Therapy

## 2022-12-15 ENCOUNTER — Other Ambulatory Visit: Payer: Self-pay | Admitting: Gastroenterology

## 2022-12-15 DIAGNOSIS — Z170 Estrogen receptor positive status [ER+]: Secondary | ICD-10-CM | POA: Diagnosis not present

## 2022-12-15 DIAGNOSIS — R293 Abnormal posture: Secondary | ICD-10-CM | POA: Insufficient documentation

## 2022-12-15 DIAGNOSIS — R1319 Other dysphagia: Secondary | ICD-10-CM

## 2022-12-15 DIAGNOSIS — Z483 Aftercare following surgery for neoplasm: Secondary | ICD-10-CM | POA: Diagnosis not present

## 2022-12-15 DIAGNOSIS — K219 Gastro-esophageal reflux disease without esophagitis: Secondary | ICD-10-CM | POA: Diagnosis not present

## 2022-12-15 DIAGNOSIS — C50411 Malignant neoplasm of upper-outer quadrant of right female breast: Secondary | ICD-10-CM

## 2022-12-15 DIAGNOSIS — R1013 Epigastric pain: Secondary | ICD-10-CM

## 2022-12-15 DIAGNOSIS — R8290 Unspecified abnormal findings in urine: Secondary | ICD-10-CM | POA: Diagnosis not present

## 2022-12-15 NOTE — Patient Instructions (Signed)
[facility]  [address], [address]  [place] [address]  [phone]  After Breast Cancer Class It is recommended you attend the ABC class to be educated on lymphedema risk reduction. This class is free of charge and lasts for 1 hour. It is a 1-time class. You will need to download the Webex app either on your phone or computer. We will send you a link the night before or the morning of the class. You should be able to click on that link to join the class. This is not a confidential class. You don't have to turn your camera on, but other participants may be able to see your email address.  Scar massage You can begin gentle scar massage to you incision sites. Gently place one hand on the incision and move the skin (without sliding on the skin) in various directions. Do this for a few minutes and then you can gently massage either coconut oil or vitamin E cream into the scars.  Compression garment You should continue wearing your compression bra until you feel like you no longer have swelling.  Home exercise Program Continue doing the exercises you were given until you feel like you can do them without feeling any tightness at the end.   Walking Program Studies show that 30 minutes of walking per day (fast enough to elevate your heart rate) can significantly reduce the risk of a cancer recurrence. If you can't walk due to other medical reasons, we encourage you to find another activity you could do (like a stationary bike or water exercise).  Posture After breast cancer surgery, people frequently sit with rounded shoulders posture because it puts their incisions on slack and feels better. If you sit like this and scar tissue forms in that position, you can become very tight and have pain sitting or standing with good posture. Try to be aware of your posture and sit and stand up tall to heal properly.  Follow up PT: It is recommended you return every 3 months for the  first 3 years following surgery to be assessed on the SOZO machine for an L-Dex score. This helps prevent clinically significant lymphedema in 95% of patients. These follow up screens are 10 minute appointments that you are not billed for. 

## 2022-12-15 NOTE — Therapy (Signed)
OUTPATIENT PHYSICAL THERAPY BREAST CANCER POST OP FOLLOW UP   Patient Name: Pamela Hendricks MRN: [id number] DOB:[birth year], 77 y.o., female Today's Date: 12/15/2022  END OF SESSION:  PT End of Session - 12/15/22 1038     Visit Number 2    Number of Visits 2    PT Start Time 2297    PT Stop Time 1123    PT Time Calculation (min) 43 min    Activity Tolerance Patient tolerated treatment well    Behavior During Therapy [facility] for tasks assessed/performed             Past Medical History:  Diagnosis Date   Arthritis    Breast cancer ([place])    Carotid artery disease ([place])    a. duplex 07/2017 - 1-39% stenosis bilaterally.   Chronic frontal sinusitis    Elevated liver enzymes    per pt   Environmental and seasonal allergies    GERD (gastroesophageal reflux disease)    Hyperlipidemia    Hypertension    Migraines    sinus headaches, no longer having migraines   OSA (obstructive sleep apnea)    CPAP   Peripheral neuropathy    Peripheral vascular disease ([place])    carotid blockage - is followed by Dr. Marlou Porch   Pre-diabetes    Snores    Wears glasses    Past Surgical History:  Procedure Laterality Date   ABDOMINAL HYSTERECTOMY  1970   Total HYST   BREAST BIOPSY Bilateral 2001   benign   BREAST BIOPSY Right 10/13/2022   MM RT BREAST BX W LOC DEV 1ST LESION IMAGE BX SPEC STEREO GUIDE 10/13/2022 [facility]   BREAST BIOPSY Right 10/13/2022   MM RT BREAST BX W LOC DEV EA AD LESION IMG BX SPEC STEREO GUIDE 10/13/2022 [facility]   BREAST BIOPSY  11/19/2022   MM RT RADIOACTIVE SEED LOC MAMMO GUIDE 11/19/2022 [facility]   BREAST EXCISIONAL BIOPSY Right 2013   sclerosing lesion   BREAST LUMPECTOMY WITH RADIOACTIVE SEED AND SENTINEL LYMPH NODE BIOPSY Right 11/20/2022   Procedure: RIGHT BREAST LUMPECTOMY WITH RADIOACTIVE SEED AND SENTINEL LYMPH NODE BIOPSY;  Surgeon: Jovita Kussmaul, MD;  Location: [place];  Service: General;  Laterality: Right;    BREAST LUMPECTOMY WITH RADIOACTIVE SEED LOCALIZATION Left 08/31/2018   Procedure: BREAST LUMPECTOMY WITH RADIOACTIVE SEED LOCALIZATION;  Surgeon: Fanny Skates, MD;  Location: [place];  Service: General;  Laterality: Left;   COLONOSCOPY     EVALUATION UNDER ANESTHESIA WITH HEMORRHOIDECTOMY N/A 09/18/2022   Procedure: ANORECTAL EXAM UNDER ANESTHESIA WITH HEMORRHOIDECTOMY WITH LIGATION AND HEMORRHOIDOPEXY X3;  Surgeon: Michael Boston, MD;  Location: [facility];  Service: General;  Laterality: N/A;  GEN AND LOCAL   JOINT REPLACEMENT Left 2001   Left Knee Replacement   KNEE ARTHROSCOPY Left    lt   NASAL SINUS SURGERY     NASAL SINUS SURGERY Bilateral 09/30/2019   Procedure: ENDOSCOPIC SINUS SURGERY;  Surgeon: Jerrell Belfast, MD;  Location: [place];  Service: ENT;  Laterality: Bilateral;   TOE SURGERY     left foot digit # 2   TOTAL KNEE ARTHROPLASTY Right 10/12/2018   Procedure: TOTAL KNEE ARTHROPLASTY;  Surgeon: Melrose Nakayama, MD;  Location: [place];  Service: Orthopedics;  Laterality: Right;   WISDOM TOOTH EXTRACTION     Patient Active Problem List   Diagnosis Date Noted   Malignant neoplasm of upper-outer quadrant of right breast in female, estrogen receptor positive ([place]) 10/20/2022  Chronic constipation 08/18/2022   Difficulty urinating 08/18/2022   Internal hemorrhoid, bleeding 08/18/2022   Prolapsed internal hemorrhoids, grade 4 08/18/2022   Pain in both lower extremities 09/16/2021   Essential hypertension 09/16/2021   Mixed hyperlipidemia 09/16/2021   Carotid artery plaque, bilateral 09/16/2021   Aortic atherosclerosis ([place]) 09/16/2021   Chest pain of uncertain etiology 73/71/0626   Chronic daily headache 02/13/2020   Meralgia paresthetica of right side 02/13/2020   Status post operation on paranasal sinus 01/23/2020   Primary osteoarthritis of right knee 10/12/2018   Obstructive sleep apnea treated with continuous positive airway pressure (CPAP) 01/28/2018   OSA (obstructive  sleep apnea) 08/27/2017   Excessive daytime sleepiness 08/27/2017   Paronychia of toe 06/16/2016   Sinusitis, chronic 08/13/2015   Migraine 11/22/2014   Sleep apnea 11/22/2014   Abnormal MRI of head 07/04/2013   Headache(784.0) 03/29/2013   Peripheral neuropathy    Major depressive disorder    Abnormal mammogram 06/10/2012    REFERRING PROVIDER: Dr. Autumn Messing  REFERRING DIAG: Right breast cancer  THERAPY DIAG:  Malignant neoplasm of upper-outer quadrant of right breast in female, estrogen receptor positive ([place])  Abnormal posture  Aftercare following surgery for neoplasm  Rationale for Evaluation and Treatment: Rehabilitation  ONSET DATE: 11/20/2022  SUBJECTIVE:                                                                                                                                                                                           SUBJECTIVE STATEMENT: Patient underwent a right lumpectomy and sentinel node biopsy (1 negative node) on 11/20/2022. She will not proceed with radiation but is taking anti-estrogen therapy.  PERTINENT HISTORY:  Patient was diagnosed on 09/17/2022 with right grade 2 invasive ductal carcinoma breast cancer. She underwent a right lumpectomy and sentinel node biopsy (1 negative node) on 11/20/2022. It is ER/PR positive and HER2 negative with a Ki67 of 10%.   PATIENT GOALS:  Reassess how my recovery is going related to arm function, pain, and swelling.  PAIN:  Are you having pain? Yes: NPRS scale: 1/10 Pain location: right breast Pain description: tender Aggravating factors: nothing Relieving factors: nothing  PRECAUTIONS: Recent Surgery, right UE Lymphedema risk  ACTIVITY LEVEL / LEISURE: Walking 10 minutes 2x/week   OBJECTIVE:   PATIENT SURVEYS:  QUICK DASH:  Quick Dash - 12/15/22 0001     Open a tight or new jar Mild difficulty    Do heavy household chores (wash walls, wash floors) No difficulty    Carry a shopping bag or  briefcase No difficulty    Wash your back No difficulty  Use a knife to cut food No difficulty    Recreational activities in which you take some force or impact through your arm, shoulder, or hand (golf, hammering, tennis) No difficulty    During the past week, to what extent has your arm, shoulder or hand problem interfered with your normal social activities with family, friends, neighbors, or groups? Not at all    During the past week, to what extent has your arm, shoulder or hand problem limited your work or other regular daily activities Not at all    Arm, shoulder, or hand pain. None    Tingling (pins and needles) in your arm, shoulder, or hand None    Difficulty Sleeping No difficulty    DASH Score 2.27 %              OBSERVATIONS: Right breast and axillary incisions are well healed. There is significant scar tissue with palpable lumpy areas around her breast incision. No redness or edema present.  POSTURE:  Forward head and rounded shoulders  LYMPHEDEMA ASSESSMENT:   UPPER EXTREMITY AROM/PROM:   A/PROM RIGHT   eval   RIGHT 12/15/2022  Shoulder extension 44 53  Shoulder flexion 155 149  Shoulder abduction 159 167  Shoulder internal rotation 63 45  Shoulder external rotation 83 88                          (Blank rows = not tested)   A/PROM LEFT   Eval - all limited by pain  Shoulder extension 33  Shoulder flexion 88  Shoulder abduction 96  Shoulder internal rotation 12  Shoulder external rotation 38                          (Blank rows = not tested)   CERVICAL AROM: All within normal limits:      Percent limited  Flexion WNL  Extension 25% limited  Right lateral flexion 25% limited  Left lateral flexion 25% limited  Right rotation 25% limited  Left rotation 25% limited      UPPER EXTREMITY STRENGTH: Not tested left UE; right arm WFL   LYMPHEDEMA ASSESSMENTS:    LANDMARK RIGHT   eval RIGHT 12/15/2022  10 cm proximal to olecranon process 32.7 34.6   Olecranon process 26 25.8  10 cm proximal to ulnar styloid process 23 22.1  Just proximal to ulnar styloid process 15.2 14.6  Across hand at thumb web space 18.7 18.5  At base of 2nd digit 5.9 5.8  (Blank rows = not tested)   LANDMARK LEFT   eval LEFT 12/15/2022  10 cm proximal to olecranon process 33.8 34.3  Olecranon process 26.'8 27  10 '$ cm proximal to ulnar styloid process 23 23.2  Just proximal to ulnar styloid process 15.9 15.8  Across hand at thumb web space 18.6 18  At base of 2nd digit 15.8 5.8  (Blank rows = not tested)    Surgery type/Date: Right lumpectomy and sentinel node biopsy 11/20/2022 Number of lymph nodes removed: 1 Current/past treatment (chemo, radiation, hormone therapy): none Other symptoms:  Heaviness/tightness No Pain Yes Pitting edema No Infections No Decreased scar mobility Yes Stemmer sign No  PATIENT EDUCATION:  Education details: HEP Person educated: Patient Education method: Customer service manager Education comprehension: verbalized understanding  HOME EXERCISE PROGRAM: Reviewed previously given post op HEP.   ASSESSMENT:  CLINICAL IMPRESSION: Patient is doing very well s/p right lumpectomy and  sentinel node biopsy (1 negative node) on 11/20/2022. She has regained full shoulder ROM, shows no signs of lymphedema, and her incisions are well healed. She has some scar tissue in her right breast but was educated on doing self scar massage and verbalized good understanding. She has no PT needs right now but will attend the After Breast Cancer class.  Pt will benefit from skilled therapeutic intervention to improve on the following deficits: Decreased knowledge of precautions, impaired UE functional use, pain, decreased ROM, postural dysfunction.   PT treatment/interventions: ADL/Self care home management, Therapeutic exercises, Patient/Family education, Self Care, and Re-evaluation   GOALS: Goals reviewed with patient? Yes  LONG TERM  GOALS:  (STG=LTG)  GOALS Name Target Date  Goal status  1 Pt will demonstrate she has regained full shoulder ROM and function post operatively compared to baselines.  Baseline: 12/17/2022 MET     PLAN:  PT FREQUENCY/DURATION: N/A  PLAN FOR NEXT SESSION: D/C   [facility]  [address], [address]  [place] [address]  [phone]  After Breast Cancer Class It is recommended you attend the ABC class to be educated on lymphedema risk reduction. This class is free of charge and lasts for 1 hour. It is a 1-time class. You will need to download the Webex app either on your phone or computer. We will send you a link the night before or the morning of the class. You should be able to click on that link to join the class. This is not a confidential class. You don't have to turn your camera on, but other participants may be able to see your email address.  Scar massage You can begin gentle scar massage to you incision sites. Gently place one hand on the incision and move the skin (without sliding on the skin) in various directions. Do this for a few minutes and then you can gently massage either coconut oil or vitamin E cream into the scars.  Compression garment You should continue wearing your compression bra until you feel like you no longer have swelling.  Home exercise Program Continue doing the exercises you were given until you feel like you can do them without feeling any tightness at the end.   Walking Program Studies show that 30 minutes of walking per day (fast enough to elevate your heart rate) can significantly reduce the risk of a cancer recurrence. If you can't walk due to other medical reasons, we encourage you to find another activity you could do (like a stationary bike or water exercise).  Posture After breast cancer surgery, people frequently sit with rounded shoulders posture because it puts their incisions on slack and feels better. If you sit  like this and scar tissue forms in that position, you can become very tight and have pain sitting or standing with good posture. Try to be aware of your posture and sit and stand up tall to heal properly.  Follow up PT: It is recommended you return every 3 months for the first 3 years following surgery to be assessed on the SOZO machine for an L-Dex score. This helps prevent clinically significant lymphedema in 95% of patients. These follow up screens are 10 minute appointments that you are not billed for.  PHYSICAL THERAPY DISCHARGE SUMMARY  Visits from Start of Care: 2  Current functional level related to goals / functional outcomes: See above for objective measurements; goals met   Remaining deficits: None except scar tissue that pt will address  Education / Equipment: HEP and lymphedema education   Patient agrees to discharge. Patient goals were met. Patient is being discharged due to meeting the stated rehab goals.  Annia Friendly, [place] 12/15/22 11:25 AM

## 2022-12-19 ENCOUNTER — Ambulatory Visit: Payer: 59 | Admitting: Radiation Oncology

## 2022-12-19 ENCOUNTER — Ambulatory Visit: Payer: Medicare Other | Admitting: Radiation Oncology

## 2022-12-19 ENCOUNTER — Ambulatory Visit: Payer: 59

## 2022-12-25 DIAGNOSIS — K5901 Slow transit constipation: Secondary | ICD-10-CM | POA: Diagnosis not present

## 2022-12-25 DIAGNOSIS — Z87440 Personal history of urinary (tract) infections: Secondary | ICD-10-CM | POA: Diagnosis not present

## 2022-12-25 DIAGNOSIS — N3281 Overactive bladder: Secondary | ICD-10-CM | POA: Diagnosis not present

## 2022-12-25 DIAGNOSIS — R682 Dry mouth, unspecified: Secondary | ICD-10-CM | POA: Diagnosis not present

## 2022-12-30 ENCOUNTER — Ambulatory Visit
Admission: RE | Admit: 2022-12-30 | Discharge: 2022-12-30 | Disposition: A | Discharge status: Home / Self Care | Payer: 59 | Source: Ambulatory Visit | Attending: Gastroenterology | Admitting: Gastroenterology

## 2022-12-30 DIAGNOSIS — K219 Gastro-esophageal reflux disease without esophagitis: Secondary | ICD-10-CM

## 2022-12-30 DIAGNOSIS — K5901 Slow transit constipation: Secondary | ICD-10-CM | POA: Diagnosis not present

## 2022-12-30 DIAGNOSIS — K449 Diaphragmatic hernia without obstruction or gangrene: Secondary | ICD-10-CM | POA: Diagnosis not present

## 2022-12-30 DIAGNOSIS — R1319 Other dysphagia: Secondary | ICD-10-CM

## 2022-12-30 DIAGNOSIS — R1013 Epigastric pain: Secondary | ICD-10-CM

## 2023-01-14 NOTE — Patient Instructions (Incomplete)

## 2023-01-14 NOTE — Progress Notes (Unsigned)
PATIENT: Pamela Hendricks DOB: 28-Jun-1946  REASON FOR VISIT: follow up HISTORY FROM: patient  No chief complaint on file.     HISTORY OF PRESENT ILLNESS:  Pamela Hendricks is a 77 year old female, seen in request by her primary care physician Leeroy Cha for evaluation  of head pressure, frequent transient sharp pain, initial evaluation was February 13, 2020.   She has past medical history of hypertension, hyperlipidemia, I saw her initially in 2016 for similar complaints,   She had long history of "sinus headache", with frequent sinus symptoms, runny nose, watering eyes, for many years she self treated with Claritin, around 2014, she had worsening of her headaches, presented to the emergency room few times around the period of time, I personally reviewed MRI of brain in May 2014, no acute abnormality, mild supratentorium small vessel disease, there is also evidence of acute left sphenoid sinus disease, chronic opacification of the right sphenoid sinus   She was started on Inderal 50 mg daily, which has helped her headache moderately, she also complains of early morning headache, was later referred to sleep study, confirmed obstructive sleep apnea, was prescribed CPAP machine, later nortriptyline 50 mg every night was also added on, her headache was apparently under good control for a while, she lost to follow-up.   She had bilateral revision endoscopic sinus surgery bilateral total ethmoidectomy, bilateral maxillary antrostomy, with removal of diseased tissue, lateral nasal frontal recess exploration by ENT  Dr. Wilburn Cornelia in November 2020   Despite the sinus surgery, she continue complains of returning of her headaches, she complains of constant pressure in her head," my head is as big as this room", in addition she complains of transient sharp piercing pain involving different spot in her skull, lasting for few seconds, she denies visual change, hearing loss, no jaw  claudication,   She continue complains of frequent sinus symptoms, nasal discharge, she does nasal wash few times each day, with dried discharge.  She was not able to tolerate her CPAP machine since her surgery, taking Tylenol at least 4 tablets on a daily basis   She also complains of sudden onset right lateral thigh area paresthesia above knee since February 2020, she had a history of bilateral knee replacement, mild low back pain,    Update May 03, 2020 SS: Laboratory evaluation revealed elevated ESR, normal CRP.  MRI of the brain showed chronic bilateral frontal, maxillary sinusitis, no acute intracranial abnormalities.  Remains on nortriptyline 50 mg at bedtime, Inderal LA 60 mg daily (from PCP).  Reports 1-2 headaches a week, will take Tylenol with good benefit.  No longer taking daily Tylenol.  With nortriptyline, no longer having bad headache to the back of her head.  Is yet to be back on CPAP, following sinus surgery.  She does note to be sleeping better with nortriptyline.  May have slight dry mouth as side effect.  Overall, doing well, is widowed for 16 years, remains quite active, doesn't let anything get her down, very active in church.  Presents today unaccompanied.  Update 06/19/2020 ALL: Pamela Hendricks is a 77 y.o. female here today for follow up for OSA on CPAP therapy and headaches. She continues Inderal and nortriptyline. She reports headaches are well managed. She rarely has a headache. She can abort headache with Tylenol.   She recently restarted CPAP therapy. She has been compliant with use over the past 24 days. She has not noted significant changes in how she feels but contributes  this to feeling well, overall. She has all needed supplies. She denies concerns with CPAP machine.   Compliance report dated 05/19/2020 through 06/17/2020 reveals that she used CPAP twenty-four of the past 30 days for compliance of 80%.  She used CPAP greater than 4 hours twenty-four of the past 30 days  for compliance of 80%.  Average usage was 6 hours and 29 minutes.  Residual AHI was slightly elevated at 6.1 on 5 to 18 cm of water and an EPR of two.  There was no significant leak noted.  UPDATE 10/17/2021 ALL: [name] returns for follow up for OSA on CPAP. She was seen by Dr Brett Fairy in 10/2020 for consideration of Inspire and advised to continue CPAP. She had follow up with Dr Krista Blue 07/2021 and reported rare headaches and compliance with CPAP. She was advised to wean off Inderal and continue nortriptyline. She reports doing very well. She has had a couple of headaches that were aborted with Tylenol. She reports having eye surgery that has interrupted CPAP usage. She hasn't had any specific issues with machine or supplies. It does make s whistling sound sometimes.     UPDATE 07/08/2022 ALL: Paying returns for follow up for OSA on CPAP and headaches. She was last seen 10/2021 and having an elevated leak. She reports switching out her mask but it has not helped. She continues nortriptyline '50mg'$  at bedtime. Headaches are well managed. She rarely has a headache.     UPDATE 01/15/2023 ALL: Pamela Hendricks returns for follow up for OSA on CPAP and headaches. She continues nortriptyline '50mg'$  QHS.   AHI and leak were elevated at last visit. She was encouraged to sleep on her side and continue working with DME for correct mask fit.    REVIEW OF SYSTEMS: Out of a complete 14 system review of symptoms, the patient complains only of the following symptoms, headache and fatigue and all other reviewed systems are negative.  ALLERGIES: Allergies  Allergen Reactions   Other Itching and Other (See Comments)    Watery eyes , itching - Grass, Rag Weed, Mold, Smoke   Grass Pollen(K-O-R-T-Swt Vern) Other (See Comments)   Molds & Smuts Cough and Other (See Comments)   Pollen Extract Other (See Comments)    HOME MEDICATIONS: Outpatient Medications Prior to Visit  Medication Sig Dispense Refill   acetaminophen  (TYLENOL) 500 MG tablet Take 1,000 mg by mouth every 6 (six) hours as needed for mild pain or headache.     alum & mag hydroxide-simeth (MAALOX MAX) 400-400-40 MG/5ML suspension Take 5 mLs by mouth every 6 (six) hours as needed for indigestion. 355 mL 0   anastrozole (ARIMIDEX) 1 MG tablet Take 1 tablet (1 mg total) by mouth daily. 90 tablet 3   aspirin EC 81 MG tablet Take 81 mg by mouth daily.     atorvastatin (LIPITOR) 10 MG tablet Take 10 mg by mouth at bedtime.     benazepril (LOTENSIN) 40 MG tablet Take 40 mg by mouth daily.      benazepril (LOTENSIN) 40 MG tablet Take 1 tablet by mouth daily.     Calcium Carb-Cholecalciferol (CALCIUM 600+D3 PO) Take 1 tablet by mouth 2 (two) times daily.     celecoxib (CELEBREX) 200 MG capsule Take 200 mg by mouth daily.     clobetasol ointment (TEMOVATE) [id number] % Apply 1 application topically 2 (two) times daily.     estradiol (ESTRACE) 0.1 MG/GM vaginal cream Place 0.5 Applicatorfuls vaginally at bedtime.  fluocinonide cream (LIDEX) [id number] % Apply 1 Application topically daily.     fluticasone (FLONASE) 50 MCG/ACT nasal spray Place 1 spray into both nostrils daily.     hydrochlorothiazide (HYDRODIURIL) 25 MG tablet Take 25 mg by mouth daily.     loratadine (CLARITIN) 10 MG tablet Take 10 mg by mouth daily.     Multiple Vitamins-Minerals (MULTIVITAMIN WITH MINERALS) tablet Take 1 tablet by mouth daily.     NON FORMULARY Pt uses a cpap nightly     nortriptyline (PAMELOR) 25 MG capsule TAKE 2 CAPSULES(50 MG) BY MOUTH AT BEDTIME 180 capsule 4   omeprazole (PRILOSEC) 20 MG capsule Take 1 capsule (20 mg total) by mouth daily. 30 capsule 0   oxybutynin (DITROPAN) 5 MG tablet Take 5 mg by mouth 3 (three) times daily.     oxyCODONE (OXY IR/ROXICODONE) 5 MG immediate release tablet Take 1 tablet (5 mg total) by mouth every 6 (six) hours as needed for severe pain or breakthrough pain. (Patient not taking: Reported on 12/01/2022) 30 tablet 0   oxyCODONE (ROXICODONE)  5 MG immediate release tablet Take 1 tablet (5 mg total) by mouth every 6 (six) hours as needed for severe pain. 10 tablet 0   oxymetazoline (AFRIN) 0.05 % nasal spray Place 1 spray into both nostrils 2 (two) times daily.     pantoprazole (PROTONIX) 40 MG tablet Take 40 mg by mouth daily.      Polyethyl Glycol-Propyl Glycol (SYSTANE) 0.4-0.3 % SOLN Place 1 drop into both eyes in the morning.     polyethylene glycol (MIRALAX / GLYCOLAX) 17 g packet Take 17 g by mouth daily.     No facility-administered medications prior to visit.    PAST MEDICAL HISTORY: Past Medical History:  Diagnosis Date   Arthritis    Breast cancer ([place])    Carotid artery disease ([place])    a. duplex 07/2017 - 1-39% stenosis bilaterally.   Chronic frontal sinusitis    Elevated liver enzymes    per pt   Environmental and seasonal allergies    GERD (gastroesophageal reflux disease)    Hyperlipidemia    Hypertension    Migraines    sinus headaches, no longer having migraines   OSA (obstructive sleep apnea)    CPAP   Peripheral neuropathy    Peripheral vascular disease ([place])    carotid blockage - is followed by Dr. Marlou Porch   Pre-diabetes    Snores    Wears glasses     PAST SURGICAL HISTORY: Past Surgical History:  Procedure Laterality Date   ABDOMINAL HYSTERECTOMY  1970   Total HYST   BREAST BIOPSY Bilateral 2001   benign   BREAST BIOPSY Right 10/13/2022   MM RT BREAST BX W LOC DEV 1ST LESION IMAGE BX SPEC STEREO GUIDE 10/13/2022 [facility]   BREAST BIOPSY Right 10/13/2022   MM RT BREAST BX W LOC DEV EA AD LESION IMG BX SPEC STEREO GUIDE 10/13/2022 [facility]   BREAST BIOPSY  11/19/2022   MM RT RADIOACTIVE SEED LOC MAMMO GUIDE 11/19/2022 [facility]   BREAST EXCISIONAL BIOPSY Right 2013   sclerosing lesion   BREAST LUMPECTOMY WITH RADIOACTIVE SEED AND SENTINEL LYMPH NODE BIOPSY Right 11/20/2022   Procedure: RIGHT BREAST LUMPECTOMY WITH RADIOACTIVE SEED AND SENTINEL LYMPH NODE BIOPSY;   Surgeon: Jovita Kussmaul, MD;  Location: [facility];  Service: General;  Laterality: Right;   BREAST LUMPECTOMY WITH RADIOACTIVE SEED LOCALIZATION Left 08/31/2018   Procedure: BREAST LUMPECTOMY WITH  RADIOACTIVE SEED LOCALIZATION;  Surgeon: Fanny Skates, MD;  Location: [facility];  Service: General;  Laterality: Left;   COLONOSCOPY     EVALUATION UNDER ANESTHESIA WITH HEMORRHOIDECTOMY N/A 09/18/2022   Procedure: ANORECTAL EXAM UNDER ANESTHESIA WITH HEMORRHOIDECTOMY WITH LIGATION AND HEMORRHOIDOPEXY X3;  Surgeon: Michael Boston, MD;  Location: [facility];  Service: General;  Laterality: N/A;  GEN AND LOCAL   JOINT REPLACEMENT Left 2001   Left Knee Replacement   KNEE ARTHROSCOPY Left    lt   NASAL SINUS SURGERY     NASAL SINUS SURGERY Bilateral 09/30/2019   Procedure: ENDOSCOPIC SINUS SURGERY;  Surgeon: Jerrell Belfast, MD;  Location: [facility];  Service: ENT;  Laterality: Bilateral;   TOE SURGERY     left foot digit # 2   TOTAL KNEE ARTHROPLASTY Right 10/12/2018   Procedure: TOTAL KNEE ARTHROPLASTY;  Surgeon: Melrose Nakayama, MD;  Location: [facility];  Service: Orthopedics;  Laterality: Right;   WISDOM TOOTH EXTRACTION      FAMILY HISTORY: Family History  Problem Relation Age of Onset   Cancer Mother    Hypertension Mother    Hypertension Father    Heart disease Father        MI around 54, died at 50   [place] Sister        Brain Ca   CAD Daughter        Had bypass in her 30s (type 1 diabetic), died at 74    SOCIAL HISTORY: Social History   Socioeconomic History   Marital status: Widowed    Spouse name: Laverna Peace   Number of children: 2   Years of education: 11   Highest education level: Not on file  Occupational History   Occupation: retired    Comment: works partime with vendors  Tobacco Use   Smoking status: Never   Smokeless tobacco: Never  Scientific laboratory technician Use: Never used  Substance and Sexual Activity   Alcohol use: No   Drug  use: No   Sexual activity: Not Currently  Other Topics Concern   Not on file  Social History Narrative   Patient is widowed. Patient is retired . 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 Patient has a 11th grade education. She has two children.   Social Determinants of Health   Financial Resource Strain: Not on file  Food Insecurity: Not on file  Transportation Needs: Not on file  Physical Activity: Not on file  Stress: Not on file  Social Connections: Not on file  Intimate Partner Violence: Not on file      PHYSICAL EXAM  There were no vitals filed for this visit.    There is no height or weight on file to calculate BMI.  Generalized: Well developed, in no acute distress  Cardiology: normal rate and rhythm, no murmur noted Respiratory: clear to auscultation bilaterally  Neurological examination  Mentation: Alert oriented to time, place, history taking. Follows all commands speech and language fluent Cranial nerve II-XII: Pupils were equal round reactive to light. Extraocular movements were full, visual field were full  Motor: The motor testing reveals 5 over 5 strength of all 4 extremities. Good symmetric motor tone is noted throughout.  Gait and station: Gait is normal.    DIAGNOSTIC DATA (LABS, IMAGING, TESTING) - I reviewed patient records, labs, notes, testing and imaging myself where available.      No data to  display           Lab Results  Component Value Date   WBC 8.3 12/09/2022   HGB 13.1 12/09/2022   HCT 41.1 12/09/2022   MCV 97.2 12/09/2022   PLT 279 12/09/2022      Component Value Date/Time   NA 137 12/09/2022 1259   NA 139 11/22/2014 1050   K 3.6 12/09/2022 1259   CL 99 12/09/2022 1259   CO2 26 12/09/2022 1259   GLUCOSE 97 12/09/2022 1259   BUN 12 12/09/2022 1259   BUN 11 11/22/2014 1050   CREATININE 0.75 12/09/2022 1259   CREATININE 0.67 10/22/2022 1200   CALCIUM 9.5 12/09/2022 1259   PROT 7.8 12/09/2022 1608   PROT 6.1  12/14/2017 1145   ALBUMIN 4.2 12/09/2022 1608   ALBUMIN 4.0 12/14/2017 1145   AST 36 12/09/2022 1608   AST 26 10/22/2022 1200   ALT 36 12/09/2022 1608   ALT 29 10/22/2022 1200   ALKPHOS 73 12/09/2022 1608   BILITOT 0.8 12/09/2022 1608   BILITOT 0.5 10/22/2022 1200   GFRNONAA >60 12/09/2022 1259   GFRNONAA >60 10/22/2022 1200   GFRAA >60 09/27/2019 0920   Lab Results  Component Value Date   CHOL 125 12/17/2021   HDL 51 12/17/2021   LDLCALC 58 12/17/2021   TRIG 80 12/17/2021   CHOLHDL 2.5 12/17/2021   Lab Results  Component Value Date   HGBA1C 5.4 09/17/2022   No results found for: "VITAMINB12" Lab Results  Component Value Date   TSH 2.560 11/22/2014     ASSESSMENT AND PLAN 77 y.o. year old female  has a past medical history of Arthritis, Breast cancer ([place]), Carotid artery disease ([place]), Chronic frontal sinusitis, Elevated liver enzymes, Environmental and seasonal allergies, GERD (gastroesophageal reflux disease), Hyperlipidemia, Hypertension, Migraines, OSA (obstructive sleep apnea), Peripheral neuropathy, Peripheral vascular disease ([place]), Pre-diabetes, Snores, and Wears glasses. here with   No diagnosis found.   Pamela Hendricks reports that, overall, she is doing very well.  She feels that headaches are well-managed.  She rarely takes Tylenol for abortive therapy.  She will continue nortriptyline '50mg'$  QHS. Compliance report reveals sub optimal compliance. AHI is slightly elevated. There continues to be an air leak. I will send her for a mask refitting. I have asked that she continue using CPAP nightly and for greater than 4 hours each night. She was encouraged not to sleep on her back. CPAP titration showed good resolution of apnea if on her side. She was encouraged to continue healthy lifestyle habits.  Adequate hydration encouraged.  She will follow-up in 6 months, sooner if needed.   Set up date 11/06/2017   No orders of the defined types were placed in this encounter.      No orders of the defined types were placed in this encounter.      Debbora Presto, FNP-C 01/14/2023, 10:09 AM [facility] [address], [place], [place] [address] [phone]

## 2023-01-15 ENCOUNTER — Ambulatory Visit (INDEPENDENT_AMBULATORY_CARE_PROVIDER_SITE_OTHER): Payer: 59 | Admitting: Family Medicine

## 2023-01-15 ENCOUNTER — Encounter: Payer: Self-pay | Admitting: Family Medicine

## 2023-01-15 VITALS — BP 156/67 | HR 73 | Ht 65.0 in | Wt 200.0 lb

## 2023-01-15 DIAGNOSIS — G43909 Migraine, unspecified, not intractable, without status migrainosus: Secondary | ICD-10-CM

## 2023-01-15 DIAGNOSIS — G4733 Obstructive sleep apnea (adult) (pediatric): Secondary | ICD-10-CM

## 2023-01-15 DIAGNOSIS — R519 Headache, unspecified: Secondary | ICD-10-CM

## 2023-01-15 MED ORDER — NORTRIPTYLINE HCL 25 MG PO CAPS: ORAL_CAPSULE | ORAL | 4 refills | Status: DC

## 2023-01-22 NOTE — Telephone Encounter (Signed)
[employer] 2021-05 - Specimen Collection Study to Evaluate Biomarkers in Subjects with Cancer   Called the patient to confirm study partcipation

## 2023-01-26 DIAGNOSIS — M25512 Pain in left shoulder: Secondary | ICD-10-CM | POA: Diagnosis not present

## 2023-01-26 DIAGNOSIS — M19012 Primary osteoarthritis, left shoulder: Secondary | ICD-10-CM | POA: Diagnosis not present

## 2023-01-27 ENCOUNTER — Telehealth: Payer: Self-pay | Admitting: Family Medicine

## 2023-01-27 NOTE — Telephone Encounter (Signed)
Pt checking on status of Surgical Clearance for Total Shoulder Arphroplasty with Dr. Isabella Stalling. Would like a call back when clearance has sent to his office.

## 2023-01-28 ENCOUNTER — Telehealth: Payer: Self-pay | Admitting: Cardiology

## 2023-01-28 ENCOUNTER — Telehealth: Payer: Self-pay

## 2023-01-28 NOTE — Telephone Encounter (Signed)
   Pre-operative Risk Assessment    Patient Name: Pamela Hendricks  DOB: [birth year] MRN: [id number]      Request for Surgical Clearance    Procedure:   Left Reverse Total Shoulder Arthroplasty  Date of Surgery:  Clearance TBD                                 Surgeon:  Dr. Tamera Punt Surgeon's Group or Practice Name:  [facility] Phone number:  [phone] Fax number:  [phone]   Type of Clearance Requested:   - Medical    Type of Anesthesia:   Choice   Additional requests/questions:  Please advise surgeon/provider what medications should be held.  Signed, Belisicia T Woods   01/28/2023, 11:26 AM

## 2023-01-28 NOTE — Telephone Encounter (Signed)
   Name: Pamela Hendricks  DOB: Jun 03, 1946  MRN: [id number]  Primary Cardiologist: Candee Furbish, MD   Preoperative team, please contact this patient and set up a phone call appointment for further preoperative risk assessment. Please obtain consent and complete medication review. Thank you for your help.   Mayra Reel, NP 01/28/2023, 11:58 AM [facility]

## 2023-01-28 NOTE — Telephone Encounter (Signed)
  Patient Consent for Virtual Visit      Pamela Hendricks has provided verbal consent on 01/28/2023 for a virtual visit (video or telephone).   CONSENT FOR VIRTUAL VISIT FOR:  Pamela Hendricks  By participating in this virtual visit I agree to the following:  I hereby voluntarily request, consent and authorize [facility] and its employed or contracted physicians, physician assistants, nurse practitioners or other licensed health care professionals (the Practitioner), to provide me with telemedicine health care services (the "Services") as deemed necessary by the treating Practitioner. I acknowledge and consent to receive the Services by the Practitioner via telemedicine. I understand that the telemedicine visit will involve communicating with the Practitioner through live audiovisual communication technology and the disclosure of certain medical information by electronic transmission. I acknowledge that I have been given the opportunity to request an in-person assessment or other available alternative prior to the telemedicine visit and am voluntarily participating in the telemedicine visit.  I understand that I have the right to withhold or withdraw my consent to the use of telemedicine in the course of my care at any time, without affecting my right to future care or treatment, and that the Practitioner or I may terminate the telemedicine visit at any time. I understand that I have the right to inspect all information obtained and/or recorded in the course of the telemedicine visit and may receive copies of available information for a reasonable fee.  I understand that some of the potential risks of receiving the Services via telemedicine include:  Delay or interruption in medical evaluation due to technological equipment failure or disruption; Information transmitted may not be sufficient (e.g. poor resolution of images) to allow for appropriate medical decision making by the  Practitioner; and/or  In rare instances, security protocols could fail, causing a breach of personal health information.  Furthermore, I acknowledge that it is my responsibility to provide information about my medical history, conditions and care that is complete and accurate to the best of my ability. I acknowledge that Practitioner's advice, recommendations, and/or decision may be based on factors not within their control, such as incomplete or inaccurate data provided by me or distortions of diagnostic images or specimens that may result from electronic transmissions. I understand that the practice of medicine is not an exact science and that Practitioner makes no warranties or guarantees regarding treatment outcomes. I acknowledge that a copy of this consent can be made available to me via my patient portal ([facility]), or I can request a printed copy by calling the office of [facility].    I understand that my insurance will be billed for this visit.   I have read or had this consent read to me. I understand the contents of this consent, which adequately explains the benefits and risks of the Services being provided via telemedicine.  I have been provided ample opportunity to ask questions regarding this consent and the Services and have had my questions answered to my satisfaction. I give my informed consent for the services to be provided through the use of telemedicine in my medical care

## 2023-01-28 NOTE — Telephone Encounter (Signed)
Called pt and let her know that we have received her Surgical Clearance form and that we will get it filled out and faxed tomorrow 01/29/2023. Pt verbalized understanding.

## 2023-01-28 NOTE — Telephone Encounter (Signed)
Patient scheduled for tele visit on 02/04/23. Med rec and consent done

## 2023-01-30 DIAGNOSIS — K649 Unspecified hemorrhoids: Secondary | ICD-10-CM | POA: Diagnosis not present

## 2023-01-30 DIAGNOSIS — I739 Peripheral vascular disease, unspecified: Secondary | ICD-10-CM | POA: Diagnosis not present

## 2023-01-30 DIAGNOSIS — C50911 Malignant neoplasm of unspecified site of right female breast: Secondary | ICD-10-CM | POA: Diagnosis not present

## 2023-01-30 DIAGNOSIS — R079 Chest pain, unspecified: Secondary | ICD-10-CM | POA: Diagnosis not present

## 2023-01-30 DIAGNOSIS — K5900 Constipation, unspecified: Secondary | ICD-10-CM | POA: Diagnosis not present

## 2023-01-30 DIAGNOSIS — J45909 Unspecified asthma, uncomplicated: Secondary | ICD-10-CM | POA: Diagnosis not present

## 2023-02-02 NOTE — Progress Notes (Unsigned)
Virtual Visit via Telephone Note   Because of Pamela Hendricks's co-morbid illnesses, she is at least at moderate risk for complications without adequate follow up.  This format is felt to be most appropriate for this patient at this time.  The patient did not have access to video technology/had technical difficulties with video requiring transitioning to audio format only (telephone).  All issues noted in this document were discussed and addressed.  No physical exam could be performed with this format.  Please refer to the patient's chart for her consent to telehealth for Pamela Hendricks.  Evaluation Performed:  Preoperative cardiovascular risk assessment _____________   Date:  02/04/2023   Patient ID:  Pamela Hendricks, Pamela Hendricks [birth year], MRN [id number] Patient Location:  Home Provider location:   Office  Primary Care Provider:  Leeroy Cha, MD Primary Cardiologist:  Candee Furbish, MD  Chief Complaint / Patient Profile   77 y.o. y/o Pamela with a h/o aortic atherosclerosis, HTN, obesity, HLD, GERD, OSA, mild carotid artery disease, atypical chest pain with normal ETT 03/2017 and Lexiscan Myoview 07/2017 who is pending left reverse total shoulder arthroplasty and presents today for telephonic preoperative cardiovascular risk assessment.  History of Present Illness    Pamela Hendricks is a 77 y.o. Pamela who presents via audio/video conferencing for a telehealth visit today.  Pt was last seen in cardiology clinic on 08/25/22 by Dr. Marlou Porch.  At that time Pamela Hendricks was doing well.  The patient is now pending procedure as outlined above. Since her last visit, she denies chest pain, shortness of breath, lower extremity edema, fatigue, palpitations, melena, hematuria, hemoptysis, diaphoresis, weakness, presyncope, syncope, orthopnea, and PND. Admission for chest pain 10/2022 with negative troponin x 2. Was felt to be secondary to reflux and she is feeling well since seeing  gastroenterology.  She remains active with yard and house work and is able to [employer]  4 METS without concerning cardiac symptoms.  Past Medical History    Past Medical History:  Diagnosis Date   Arthritis    Breast cancer ([place])    Carotid artery disease ([place])    a. duplex 07/2017 - 1-39% stenosis bilaterally.   Chronic frontal sinusitis    Elevated liver enzymes    per pt   Environmental and seasonal allergies    GERD (gastroesophageal reflux disease)    Hyperlipidemia    Hypertension    Migraines    sinus headaches, no longer having migraines   OSA (obstructive sleep apnea)    CPAP   Peripheral neuropathy    Peripheral vascular disease ([facility])    carotid blockage - is followed by Dr. Marlou Porch   Pre-diabetes    Snores    Wears glasses    Past Surgical History:  Procedure Laterality Date   ABDOMINAL HYSTERECTOMY  1970   Total HYST   BREAST BIOPSY Bilateral 2001   benign   BREAST BIOPSY Right 10/13/2022   MM RT BREAST BX W LOC DEV 1ST LESION IMAGE BX SPEC STEREO GUIDE 10/13/2022 [facility]   BREAST BIOPSY Right 10/13/2022   MM RT BREAST BX W LOC DEV EA AD LESION IMG BX SPEC STEREO GUIDE 10/13/2022 [facility]   BREAST BIOPSY  11/19/2022   MM RT RADIOACTIVE SEED LOC MAMMO GUIDE 11/19/2022 [facility]   BREAST EXCISIONAL BIOPSY Right 2013   sclerosing lesion   BREAST LUMPECTOMY WITH RADIOACTIVE SEED AND SENTINEL LYMPH NODE BIOPSY Right 11/20/2022   Procedure: RIGHT BREAST LUMPECTOMY WITH RADIOACTIVE  SEED AND SENTINEL LYMPH NODE BIOPSY;  Surgeon: Jovita Kussmaul, MD;  Location: [facility];  Service: General;  Laterality: Right;   BREAST LUMPECTOMY WITH RADIOACTIVE SEED LOCALIZATION Left 08/31/2018   Procedure: BREAST LUMPECTOMY WITH RADIOACTIVE SEED LOCALIZATION;  Surgeon: Fanny Skates, MD;  Location: [facility];  Service: General;  Laterality: Left;   COLONOSCOPY     EVALUATION UNDER ANESTHESIA WITH HEMORRHOIDECTOMY N/A 09/18/2022   Procedure:  ANORECTAL EXAM UNDER ANESTHESIA WITH HEMORRHOIDECTOMY WITH LIGATION AND HEMORRHOIDOPEXY X3;  Surgeon: Michael Boston, MD;  Location: [facility];  Service: General;  Laterality: N/A;  GEN AND LOCAL   JOINT REPLACEMENT Left 2001   Left Knee Replacement   KNEE ARTHROSCOPY Left    lt   NASAL SINUS SURGERY     NASAL SINUS SURGERY Bilateral 09/30/2019   Procedure: ENDOSCOPIC SINUS SURGERY;  Surgeon: Jerrell Belfast, MD;  Location: [facility];  Service: ENT;  Laterality: Bilateral;   TOE SURGERY     left foot digit # 2   TOTAL KNEE ARTHROPLASTY Right 10/12/2018   Procedure: TOTAL KNEE ARTHROPLASTY;  Surgeon: Melrose Nakayama, MD;  Location: [facility];  Service: Orthopedics;  Laterality: Right;   WISDOM TOOTH EXTRACTION      Allergies  Allergies  Allergen Reactions   Other Itching and Other (See Comments)    Watery eyes , itching - Grass, Rag Weed, Mold, Smoke   Grass Pollen(K-O-R-T-Swt Vern) Other (See Comments)   Molds & Smuts Cough and Other (See Comments)   Pollen Extract Other (See Comments)    Home Medications    Prior to Admission medications   Medication Sig Start Date End Date Taking? Authorizing Provider  acetaminophen (TYLENOL) 500 MG tablet Take 1,000 mg by mouth every 6 (six) hours as needed for mild pain or headache.    [provider]  anastrozole (ARIMIDEX) 1 MG tablet Take 1 tablet (1 mg total) by mouth daily. 12/01/22   Nicholas Lose, MD  aspirin EC 81 MG tablet Take 81 mg by mouth daily.    [provider]  atorvastatin (LIPITOR) 10 MG tablet Take 10 mg by mouth at bedtime. 07/28/22   [provider]  benazepril (LOTENSIN) 40 MG tablet Take 1 tablet by mouth daily.    [provider]  Calcium Carb-Cholecalciferol (CALCIUM 600+D3 PO) Take 1 tablet by mouth 2 (two) times daily.    [provider]  celecoxib (CELEBREX) 200 MG capsule Take 200 mg by mouth daily. 09/15/19   [provider]  clobetasol ointment (TEMOVATE) [id number] % Apply 1  application topically 2 (two) times daily.    [provider]  estradiol (ESTRACE) 0.1 MG/GM vaginal cream Place 0.5 Applicatorfuls vaginally at bedtime. 09/04/22   [provider]  fluocinonide cream (LIDEX) [id number] % Apply 1 Application topically daily. 09/28/19   [provider]  fluticasone (FLONASE) 50 MCG/ACT nasal spray Place 1 spray into both nostrils daily.    [provider]  hydrochlorothiazide (HYDRODIURIL) 25 MG tablet Take 25 mg by mouth daily. 06/28/21   [provider]  loratadine (CLARITIN) 10 MG tablet Take 10 mg by mouth daily.    [provider]  mirabegron ER (MYRBETRIQ) 50 MG TB24 tablet Take 50 mg by mouth daily.    [provider]  Multiple Vitamins-Minerals (MULTIVITAMIN WITH MINERALS) tablet Take 1 tablet by mouth daily.    [provider]  NON FORMULARY Pt uses a cpap nightly    [provider]  nortriptyline (PAMELOR) 25  MG capsule TAKE 2 CAPSULES(50 MG) BY MOUTH AT BEDTIME 01/15/23   Lomax, Amy, NP  pantoprazole (PROTONIX) 40 MG tablet Take 40 mg by mouth daily.     [provider]  Polyethyl Glycol-Propyl Glycol (SYSTANE) 0.4-0.3 % SOLN Place 1 drop into both eyes in the morning.    [provider]  polyethylene glycol (MIRALAX / GLYCOLAX) 17 g packet Take 17 g by mouth daily.    [provider]    Physical Exam    Vital Signs:  Pamela Hendricks does not have vital signs available for review today.  Given telephonic nature of communication, physical exam is limited. AAOx3. NAD. Normal affect.  Speech and respirations are unlabored.  Accessory Clinical Findings    None  Assessment & Plan    1.  Preoperative Cardiovascular Risk Assessment: According to the Revised Cardiac Risk Index (RCRI), her Perioperative Risk of Major Cardiac Event is (%): 0.9. Her Functional Capacity in METs is: 6.61 according to the Duke Activity Status Index (DASI). The patient is doing  well from a cardiac perspective. Therefore, based on ACC/AHA guidelines, the patient would be at acceptable risk for the planned procedure without further cardiovascular testing.   The patient was advised that if she develops new symptoms prior to surgery to contact our office to arrange for a follow-up visit, and she verbalized understanding.  Per office protocol, he may hold aspirin for 5-7 days prior to procedure and should resume as soon as hemodynamically stable postoperatively.  A copy of this note will be routed to requesting surgeon.  Time:   Today, I have spent 8 minutes with the patient with telehealth technology discussing medical history, symptoms, and management plan.    Emmaline Life, NP-C  02/04/2023, 10:21 AM [address] Office [phone] Fax [phone]

## 2023-02-04 ENCOUNTER — Encounter: Payer: Self-pay | Admitting: Nurse Practitioner

## 2023-02-04 ENCOUNTER — Ambulatory Visit: Payer: 59 | Attending: Internal Medicine | Admitting: Nurse Practitioner

## 2023-02-04 DIAGNOSIS — Z01810 Encounter for preprocedural cardiovascular examination: Secondary | ICD-10-CM

## 2023-02-12 NOTE — Telephone Encounter (Signed)
Bethena Roys called from [employer]. Stated she is following-up on Surgical Clearance form.

## 2023-02-12 NOTE — Telephone Encounter (Signed)
LVM for Pamela Hendricks to call back before 5pm today.

## 2023-02-12 NOTE — Telephone Encounter (Signed)
Talked to Bethena Roys and re faxed Surgical Clearance form on 02/12/2023

## 2023-02-12 NOTE — Telephone Encounter (Signed)
Called Pamela Hendricks back to see if she got the Surgical Clearance form and she Confirmed she did on 02/12/2023

## 2023-02-16 ENCOUNTER — Other Ambulatory Visit: Payer: Self-pay | Admitting: Orthopedic Surgery

## 2023-02-16 DIAGNOSIS — R1310 Dysphagia, unspecified: Secondary | ICD-10-CM | POA: Diagnosis not present

## 2023-02-16 DIAGNOSIS — K219 Gastro-esophageal reflux disease without esophagitis: Secondary | ICD-10-CM | POA: Diagnosis not present

## 2023-02-16 DIAGNOSIS — K5900 Constipation, unspecified: Secondary | ICD-10-CM | POA: Diagnosis not present

## 2023-02-16 DIAGNOSIS — K921 Melena: Secondary | ICD-10-CM | POA: Diagnosis not present

## 2023-02-16 DIAGNOSIS — Z86010 Personal history of colonic polyps: Secondary | ICD-10-CM | POA: Diagnosis not present

## 2023-02-18 NOTE — Progress Notes (Signed)
Sent message, via epic in basket, requesting orders in epic from surgeon.  

## 2023-02-18 NOTE — Care Plan (Signed)
Ortho Bundle Case Management Note  Patient Details  Name: Pamela Hendricks MRN: [id number] Date of Birth: 10-Nov-1946      Patient will discharge to home with family to assist. No DME or therapy needs at this time. Patient and MD in agreement with plan.                DME Arranged:    DME Agency:     HH Arranged:    HH Agency:     Additional Comments: Please contact me with any questions of if this plan should need to change.  Shauna Hugh,  RN,BSN,MHA,CCM  [facility] Orthopaedic Specialist  [phone] 02/18/2023, 1:56 PM

## 2023-02-23 ENCOUNTER — Inpatient Hospital Stay: Payer: 59 | Admitting: Adult Health

## 2023-02-23 ENCOUNTER — Ambulatory Visit: Payer: Self-pay | Admitting: Physical Therapy

## 2023-02-23 NOTE — Progress Notes (Signed)
COVID Vaccine Completed:  Date of COVID positive in last 90 days:  PCP - Lorenda Ishihara, MD Cardiologist - Donato Schultz, MD Neurologist - Shawnie Dapper, NP  Cardiac clearance in Epic dated 02-04-23 by Eligha Bridegroom, NP-C  Chest x-ray - 12-09-22 Epic EKG - 12-10-22 Epic Stress Test - 07-15-17 Epic ECHO -  Cardiac Cath -  Pacemaker/ICD device last checked: Spinal Cord Stimulator:  Bowel Prep -   Sleep Study - Yes, +sleep apnea CPAP -   Prediabetes Fasting Blood Sugar -  Checks Blood Sugar _____ times a day  Last dose of GLP1 agonist-  N/A GLP1 instructions:  N/A   Last dose of SGLT-2 inhibitors-  N/A SGLT-2 instructions: N/A   Blood Thinner Instructions: Aspirin Instructions:  ASA 81 mg.  Okay to hold 5-7 days per clearance Last Dose:  Activity level:  Can go up a flight of stairs and perform activities of daily living without stopping and without symptoms of chest pain or shortness of breath.  Able to exercise without symptoms  Unable to go up a flight of stairs without symptoms of     Anesthesia review:  CAD, aortic atherosclerosis, HTN, OSA, preDM  Patient denies shortness of breath, fever, cough and chest pain at PAT appointment  Patient verbalized understanding of instructions that were given to them at the PAT appointment. Patient was also instructed that they will need to review over the PAT instructions again at home before surgery.

## 2023-02-23 NOTE — Patient Instructions (Addendum)
SURGICAL WAITING ROOM VISITATION  Patients having surgery or a procedure may have no more than 2 support people in the waiting area - these visitors may rotate.    Children under the age of 11 must have an adult with them who is not the patient.  Due to an increase in RSV and influenza rates and associated hospitalizations, children ages 72 and under may not visit patients in [facility] hospitals.  If the patient needs to stay at the hospital during part of their recovery, the visitor guidelines for inpatient rooms apply. Pre-op nurse will coordinate an appropriate time for 1 support person to accompany patient in pre-op.  This support person may not rotate.    Please refer to the [facility] website for the visitor guidelines for Inpatients (after your surgery is over and you are in a regular room).       Your procedure is scheduled on: 03-05-23   Report to [facility] Main Entrance    Report to admitting at 7:00 AM   Call this number if you have problems the morning of surgery [phone]   Do not eat food :After Midnight.   After Midnight you may have the following liquids until 6:20 AM DAY OF SURGERY  Water Non-Citrus Juices (without pulp, NO RED-Apple, White grape, White cranberry) Black Coffee (NO MILK/CREAM OR CREAMERS, sugar ok)  Clear Tea (NO MILK/CREAM OR CREAMERS, sugar ok) regular and decaf                             Plain Jell-O (NO RED)                                           Fruit ices (not with fruit pulp, NO RED)                                     Popsicles (NO RED)                                                               Sports drinks like Gatorade (NO RED)                   The day of surgery:  Drink ONE (1) Pre-Surgery G2 at 6:20 AM the morning of surgery. Drink in one sitting. Do not sip.  This drink was given to you during your hospital  pre-op appointment visit. Nothing else to drink after completing the Pre-Surgery G2.          If  you have questions, please contact your surgeon's office.   FOLLOW  ANY ADDITIONAL PRE OP INSTRUCTIONS YOU RECEIVED FROM YOUR SURGEON'S OFFICE!!!     Oral Hygiene is also important to reduce your risk of infection.                                    Remember - BRUSH YOUR TEETH THE MORNING OF SURGERY WITH YOUR REGULAR TOOTHPASTE  DENTURES WILL  BE REMOVED PRIOR TO SURGERY PLEASE DO NOT APPLY "Poly grip" OR ADHESIVES!!!   Do NOT smoke after Midnight   Take these medicines the morning of surgery with A SIP OF WATER:   Claritin  Mirabegron  Pantoprazole  Tylenol if needed  Okay to use nasal spray and eydrops  Bring CPAP mask and tubing day of surgery.                              You may not have any metal on your body including hair pins, jewelry, and body piercing             Do not wear make-up, lotions, powders, perfumes or deodorant  Do not wear nail polish including gel and S&S, artificial/acrylic nails, or any other type of covering on natural nails including finger and toenails. If you have artificial nails, gel coating, etc. that needs to be removed by a nail salon please have this removed prior to surgery or surgery may need to be canceled/ delayed if the surgeon/ anesthesia feels like they are unable to be safely monitored.   Do not shave  48 hours prior to surgery.    Do not bring valuables to the hospital. [facility] IS NOT RESPONSIBLE   FOR VALUABLES.   Contacts, glasses, dentures or bridgework may not be worn into surgery.  DO NOT BRING YOUR HOME MEDICATIONS TO THE HOSPITAL. PHARMACY WILL DISPENSE MEDICATIONS LISTED ON YOUR MEDICATION LIST TO YOU DURING YOUR ADMISSION IN THE HOSPITAL!    Patients discharged on the day of surgery will not be allowed to drive home.  Someone NEEDS to stay with you for the first 24 hours after anesthesia.               Please read over the following fact sheets you were given: IF YOU HAVE QUESTIONS ABOUT YOUR PRE-OP INSTRUCTIONS PLEASE  CALL [phone] [facility]   If you received a COVID test during your pre-op visit  it is requested that you wear a mask when out in public, stay away from anyone that may not be feeling well and notify your surgeon if you develop symptoms. If you test positive for Covid or have been in contact with anyone that has tested positive in the last 10 days please notify you surgeon.    [place]- Preparing for Total Shoulder Arthroplasty    Before surgery, you can play an important role. Because skin is not sterile, your skin needs to be as free of germs as possible. You can reduce the number of germs on your skin by using the following products. Benzoyl Peroxide Gel Reduces the number of germs present on the skin Applied twice a day to shoulder area starting two days before surgery    ==================================================================  Please follow these instructions carefully:  BENZOYL PEROXIDE 5% GEL  Please do not use if you have an allergy to benzoyl peroxide.   If your skin becomes reddened/irritated stop using the benzoyl peroxide.  Starting two days before surgery, apply as follows: Apply benzoyl peroxide in the morning and at night. Apply after taking a shower. If you are not taking a shower clean entire shoulder front, back, and side along with the armpit with a clean wet washcloth.  Place a quarter-sized dollop on your shoulder and rub in thoroughly, making sure to cover the front, back, and side of your shoulder, along with the armpit.   2 days before  ____ AM   ____ PM              1 day before ____ AM   ____ PM                         Do this twice a day for two days.  (Last application is the night before surgery, AFTER using the CHG soap as described below).  DO NOT apply benzoyl peroxide gel on the day of surgery.    Incentive Spirometer  An incentive spirometer is a tool that can help keep your lungs clear and active. This tool measures how well you are  filling your lungs with each breath. Taking long deep breaths may help reverse or decrease the chance of developing breathing (pulmonary) problems (especially infection) following: A long period of time when you are unable to move or be active. BEFORE THE PROCEDURE  If the spirometer includes an indicator to show your best effort, your nurse or respiratory therapist will set it to a desired goal. If possible, sit up straight or lean slightly forward. Try not to slouch. Hold the incentive spirometer in an upright position. INSTRUCTIONS FOR USE  Sit on the edge of your bed if possible, or sit up as far as you can in bed or on a chair. Hold the incentive spirometer in an upright position. Breathe out normally. Place the mouthpiece in your mouth and seal your lips tightly around it. Breathe in slowly and as deeply as possible, raising the piston or the ball toward the top of the column. Hold your breath for 3-5 seconds or for as long as possible. Allow the piston or ball to fall to the bottom of the column. Remove the mouthpiece from your mouth and breathe out normally. Rest for a few seconds and repeat Steps 1 through 7 at least 10 times every 1-2 hours when you are awake. Take your time and take a few normal breaths between deep breaths. The spirometer may include an indicator to show your best effort. Use the indicator as a goal to work toward during each repetition. After each set of 10 deep breaths, practice coughing to be sure your lungs are clear. If you have an incision (the cut made at the time of surgery), support your incision when coughing by placing a pillow or rolled up towels firmly against it. Once you are able to get out of bed, walk around indoors and cough well. You may stop using the incentive spirometer when instructed by your caregiver.  RISKS AND COMPLICATIONS Take your time so you do not get dizzy or light-headed. If you are in pain, you may need to take or ask for pain  medication before doing incentive spirometry. It is harder to take a deep breath if you are having pain. AFTER USE Rest and breathe slowly and easily. It can be helpful to keep track of a log of your progress. Your caregiver can provide you with a simple table to help with this. If you are using the spirometer at home, follow these instructions: SEEK MEDICAL CARE IF:  You are having difficultly using the spirometer. You have trouble using the spirometer as often as instructed. Your pain medication is not giving enough relief while using the spirometer. You develop fever of 100.5 F (38.1 C) or higher. SEEK IMMEDIATE MEDICAL CARE IF:  You cough up bloody sputum that had not been present before. You develop fever of 102 F (38.9  C) or greater. You develop worsening pain at or near the incision site. MAKE SURE YOU:  Understand these instructions. Will watch your condition. Will get help right away if you are not doing well or get worse. Document Released: 03/09/2007 Document Revised: 01/19/2012 Document Reviewed: 05/10/2007 [facility] Patient Information 2014 [place], [place].   ________________________________________________________________________

## 2023-02-25 ENCOUNTER — Encounter (HOSPITAL_COMMUNITY): Payer: Self-pay

## 2023-02-25 ENCOUNTER — Encounter (HOSPITAL_COMMUNITY)
Admission: RE | Admit: 2023-02-25 | Discharge: 2023-02-25 | Disposition: A | Discharge status: Home / Self Care | Payer: 59 | Source: Ambulatory Visit | Attending: Orthopedic Surgery | Admitting: Orthopedic Surgery

## 2023-02-25 ENCOUNTER — Other Ambulatory Visit: Payer: Self-pay

## 2023-02-25 VITALS — BP 144/60 | HR 81 | Temp 98.2°F | Resp 16 | Ht 65.0 in | Wt 197.2 lb

## 2023-02-25 DIAGNOSIS — K219 Gastro-esophageal reflux disease without esophagitis: Secondary | ICD-10-CM | POA: Insufficient documentation

## 2023-02-25 DIAGNOSIS — E785 Hyperlipidemia, unspecified: Secondary | ICD-10-CM | POA: Diagnosis not present

## 2023-02-25 DIAGNOSIS — M19012 Primary osteoarthritis, left shoulder: Secondary | ICD-10-CM | POA: Insufficient documentation

## 2023-02-25 DIAGNOSIS — K769 Liver disease, unspecified: Secondary | ICD-10-CM

## 2023-02-25 DIAGNOSIS — Z85828 Personal history of other malignant neoplasm of skin: Secondary | ICD-10-CM | POA: Diagnosis not present

## 2023-02-25 DIAGNOSIS — R7303 Prediabetes: Secondary | ICD-10-CM | POA: Diagnosis not present

## 2023-02-25 DIAGNOSIS — G4733 Obstructive sleep apnea (adult) (pediatric): Secondary | ICD-10-CM | POA: Diagnosis not present

## 2023-02-25 DIAGNOSIS — C50912 Malignant neoplasm of unspecified site of left female breast: Secondary | ICD-10-CM | POA: Diagnosis not present

## 2023-02-25 DIAGNOSIS — I10 Essential (primary) hypertension: Secondary | ICD-10-CM

## 2023-02-25 DIAGNOSIS — Z01818 Encounter for other preprocedural examination: Secondary | ICD-10-CM | POA: Diagnosis not present

## 2023-02-25 HISTORY — DX: Anemia, unspecified: D64.9

## 2023-02-25 HISTORY — DX: Unspecified malignant neoplasm of skin, unspecified: C44.90

## 2023-02-25 LAB — COMPREHENSIVE METABOLIC PANEL
ALT: 21 U/L (ref 0–44)
AST: 19 U/L (ref 15–41)
Albumin: 3.8 g/dL (ref 3.5–5.0)
Alkaline Phosphatase: 55 U/L (ref 38–126)
Anion gap: 10 (ref 5–15)
BUN: 21 mg/dL (ref 8–23)
CO2: 25 mmol/L (ref 22–32)
Calcium: 9.2 mg/dL (ref 8.9–10.3)
Chloride: 102 mmol/L (ref 98–111)
Creatinine, Ser: 0.75 mg/dL (ref 0.44–1.00)
GFR, Estimated: >60 mL/min (ref >60)
Glucose, Bld: 100 mg/dL — ABNORMAL HIGH (ref 70–99)
Potassium: 4.2 mmol/L (ref 3.5–5.1)
Sodium: 137 mmol/L (ref 135–145)
Total Bilirubin: 0.4 mg/dL (ref 0.3–1.2)
Total Protein: 7.2 g/dL (ref 6.5–8.1)

## 2023-02-25 LAB — CBC
HCT: 37.3 % (ref 36.0–46.0)
Hemoglobin: 11.6 g/dL — ABNORMAL LOW (ref 12.0–15.0)
MCH: 29.7 pg (ref 26.0–34.0)
MCHC: 31.1 g/dL (ref 30.0–36.0)
MCV: 95.6 fL (ref 80.0–100.0)
Platelets: 316 10*3/uL (ref 150–400)
RBC: 3.9 MIL/uL (ref 3.87–5.11)
RDW: 12.9 % (ref 11.5–15.5)
WBC: 8.2 10*3/uL (ref 4.0–10.5)
nRBC: 0 % (ref 0.0–0.2)

## 2023-02-25 LAB — GLUCOSE, CAPILLARY: Glucose-Capillary: 110 mg/dL — ABNORMAL HIGH (ref 70–99)

## 2023-02-25 LAB — SURGICAL PCR SCREEN
MRSA, PCR: NEGATIVE
Staphylococcus aureus: POSITIVE — AB

## 2023-02-25 LAB — HEMOGLOBIN A1C
Hgb A1c MFr Bld: 5.7 % — ABNORMAL HIGH (ref 4.8–5.6)
Mean Plasma Glucose: 116.89 mg/dL

## 2023-02-25 NOTE — Progress Notes (Signed)
PCR results sent to Dr. Chandler to review. 

## 2023-02-26 NOTE — Progress Notes (Signed)
Anesthesia Chart Review   Case: [id number] Date/Time: 03/05/23 0910   Procedure: REVERSE SHOULDER ARTHROPLASTY (Left: Shoulder)   Anesthesia type: Choice   Pre-op diagnosis: LEFT SHOULDER OSTEOARTHRITIS   Location: [facility]OM 07 / [facility]   Surgeons: Jones Broom, MD       DISCUSSION:77 y.o. never smoker with h/o OSA on CPAP, breast cancer, left shoulder OA scheduled for above procedure 03/05/23 with Dr. Jones Broom.   Pt last seen by cardiology 02/04/2023. Per OV note, "Preoperative Cardiovascular Risk Assessment: According to the Revised Cardiac Risk Index (RCRI), her Perioperative Risk of Major Cardiac Event is (%): 0.9. Her Functional Capacity in METs is: 6.61 according to the Duke Activity Status Index (DASI). The patient is doing well from a cardiac perspective. Therefore, based on ACC/AHA guidelines, the patient would be at acceptable risk for the planned procedure without further cardiovascular testing.    The patient was advised that if she develops new symptoms prior to surgery to contact our office to arrange for a follow-up visit, and she verbalized understanding.   Per office protocol, he may hold aspirin for 5-7 days prior to procedure and should resume as soon as hemodynamically stable postoperatively."  Anticipate pt can proceed with planned procedure barring acute status change.   VS: BP (!) 144/60   Pulse 81   Temp 36.8 C (Oral)   Resp 16   Ht  (1.651 m)   Wt 89.4 kg   SpO2 100%   BMI 32.82 kg/m   PROVIDERS: Lorenda Ishihara, MD is PCP   Cardiologist - Donato Schultz, MD  LABS: Labs reviewed: Acceptable for surgery. (all labs ordered are listed, but only abnormal results are displayed)  Labs Reviewed  SURGICAL PCR SCREEN - Abnormal; Notable for the following components:      Result Value   Staphylococcus aureus POSITIVE (*)    All other components within normal limits  HEMOGLOBIN A1C - Abnormal; Notable for the following components:   Hgb A1c  MFr Bld 5.7 (*)    All other components within normal limits  CBC - Abnormal; Notable for the following components:   Hemoglobin 11.6 (*)    All other components within normal limits  GLUCOSE, CAPILLARY - Abnormal; Notable for the following components:   Glucose-Capillary 110 (*)    All other components within normal limits  COMPREHENSIVE METABOLIC PANEL - Abnormal; Notable for the following components:   Glucose, Bld 100 (*)    All other components within normal limits     IMAGES:   EKG:   CV: Myocardial Perfusion 07/15/2017 Nuclear stress EF: 75%. There was no ST segment deviation noted during stress. Defect 1: There is a small defect of moderate severity present in the apical anterior and apex location. This is a low risk study. The left ventricular ejection fraction is hyperdynamic (>65%).   Low risk stress nuclear study with mild soft tissue attenuation; no ischemia; EF 75 with normal wall motion.  Past Medical History:  Diagnosis Date   Anemia    Arthritis    Breast cancer    Carotid artery disease    a. duplex 07/2017 - 1-39% stenosis bilaterally.   Chronic frontal sinusitis    Elevated liver enzymes    per pt   Environmental and seasonal allergies    GERD (gastroesophageal reflux disease)    Hyperlipidemia    Hypertension    Migraines    sinus headaches, no longer having migraines   OSA (obstructive sleep apnea)  CPAP   Peripheral neuropathy    Peripheral vascular disease    carotid blockage - is followed by Dr. Anne Fu   Pre-diabetes    Skin cancer    Leg   Snores    Wears glasses     Past Surgical History:  Procedure Laterality Date   ABDOMINAL HYSTERECTOMY  1970   Total HYST   BREAST BIOPSY Bilateral 2001   benign   BREAST BIOPSY Right 10/13/2022   MM RT BREAST BX W LOC DEV 1ST LESION IMAGE BX SPEC STEREO GUIDE 10/13/2022 [facility]   BREAST BIOPSY Right 10/13/2022   MM RT BREAST BX W LOC DEV EA AD LESION IMG BX SPEC STEREO GUIDE  10/13/2022 [facility]   BREAST BIOPSY  11/19/2022   MM RT RADIOACTIVE SEED LOC MAMMO GUIDE 11/19/2022 [facility]   BREAST EXCISIONAL BIOPSY Right 2013   sclerosing lesion   BREAST LUMPECTOMY WITH RADIOACTIVE SEED AND SENTINEL LYMPH NODE BIOPSY Right 11/20/2022   Procedure: RIGHT BREAST LUMPECTOMY WITH RADIOACTIVE SEED AND SENTINEL LYMPH NODE BIOPSY;  Surgeon: Griselda Miner, MD;  Location: [facility];  Service: General;  Laterality: Right;   BREAST LUMPECTOMY WITH RADIOACTIVE SEED LOCALIZATION Left 08/31/2018   Procedure: BREAST LUMPECTOMY WITH RADIOACTIVE SEED LOCALIZATION;  Surgeon: Claud Kelp, MD;  Location: [facility];  Service: General;  Laterality: Left;   COLONOSCOPY     EVALUATION UNDER ANESTHESIA WITH HEMORRHOIDECTOMY N/A 09/18/2022   Procedure: ANORECTAL EXAM UNDER ANESTHESIA WITH HEMORRHOIDECTOMY WITH LIGATION AND HEMORRHOIDOPEXY X3;  Surgeon: Karie Soda, MD;  Location: [facility];  Service: General;  Laterality: N/A;  GEN AND LOCAL   JOINT REPLACEMENT Left 2001   Left Knee Replacement   KNEE ARTHROSCOPY Left    lt   NASAL SINUS SURGERY     NASAL SINUS SURGERY Bilateral 09/30/2019   Procedure: ENDOSCOPIC SINUS SURGERY;  Surgeon: Osborn Coho, MD;  [facility];  Service: ENT;  Laterality: Bilateral;   TOE SURGERY     left foot digit # 2   TOTAL KNEE ARTHROPLASTY Right 10/12/2018   Procedure: TOTAL KNEE ARTHROPLASTY;  Surgeon: Marcene Corning, MD;  Location: [facility];  Service: Orthopedics;  Laterality: Right;   WISDOM TOOTH EXTRACTION      MEDICATIONS:  acetaminophen (TYLENOL) 500 MG tablet   anastrozole (ARIMIDEX) 1 MG tablet   aspirin EC 81 MG tablet   atorvastatin (LIPITOR) 10 MG tablet   benazepril (LOTENSIN) 40 MG tablet   Calcium Carb-Cholecalciferol (CALCIUM 600+D3 PO)   celecoxib (CELEBREX) 200 MG capsule   clobetasol ointment (TEMOVATE) 0.05 %   estradiol (ESTRACE) 0.1 MG/GM vaginal cream   fluocinonide cream (LIDEX) 0.05 %    fluticasone (FLONASE) 50 MCG/ACT nasal spray   hydrochlorothiazide (HYDRODIURIL) 25 MG tablet   LINZESS 145 MCG CAPS capsule   loratadine (CLARITIN) 10 MG tablet   mirabegron ER (MYRBETRIQ) 50 MG TB24 tablet   Multiple Vitamins-Minerals (MULTIVITAMIN WITH MINERALS) tablet   NON FORMULARY   nortriptyline (PAMELOR) 25 MG capsule   pantoprazole (PROTONIX) 40 MG tablet   Polyethyl Glycol-Propyl Glycol (SYSTANE) 0.4-0.3 % SOLN   No current facility-administered medications for this encounter.   Jodell Cipro Ward, PA-C [facility] [phone]

## 2023-03-04 NOTE — Anesthesia Preprocedure Evaluation (Signed)
Anesthesia Evaluation  Patient identified by MRN, date of birth, ID band Patient awake    Reviewed: Allergy & Precautions, NPO status , Patient's Chart, lab work & pertinent test results  Airway Mallampati: III  TM Distance: >3 FB Neck ROM: Full    Dental no notable dental hx. (+) Dental Advisory Given, Implants   Pulmonary sleep apnea and Continuous Positive Airway Pressure Ventilation    Pulmonary exam normal breath sounds clear to auscultation       Cardiovascular hypertension, Pt. on medications + Peripheral Vascular Disease  Normal cardiovascular exam Rhythm:Regular Rate:Normal     Neuro/Psych  Headaches PSYCHIATRIC DISORDERS  Depression       GI/Hepatic Neg liver ROS,GERD  Controlled and Medicated,,  Endo/Other  Diabetes: prediabetic.    Renal/GU negative Renal ROS  negative genitourinary   Musculoskeletal  (+) Arthritis , Osteoarthritis,    Abdominal  (+) + obese  Peds  Hematology negative hematology ROS (+)   Anesthesia Other Findings   Reproductive/Obstetrics negative OB ROS                             Anesthesia Physical Anesthesia Plan  ASA: 3  Anesthesia Plan: General and Regional   Post-op Pain Management: Regional block*, Minimal or no pain anticipated, Precedex and Ofirmev IV (intra-op)*   Induction: Intravenous  PONV Risk Score and Plan: 3 and Treatment may vary due to age or medical condition, Ondansetron, Midazolam and Dexamethasone  Airway Management Planned: Oral ETT  Additional Equipment: None  Intra-op Plan:   Post-operative Plan: Extubation in OR  Informed Consent: I have reviewed the patients History and Physical, chart, labs and discussed the procedure including the risks, benefits and alternatives for the proposed anesthesia with the patient or authorized representative who has indicated his/her understanding and acceptance.     Dental advisory  given  Plan Discussed with: CRNA  Anesthesia Plan Comments: (GA w L ISB)       Anesthesia Quick Evaluation

## 2023-03-05 ENCOUNTER — Other Ambulatory Visit: Payer: Self-pay

## 2023-03-05 ENCOUNTER — Ambulatory Visit (HOSPITAL_COMMUNITY)
Admission: RE | Admit: 2023-03-05 | Discharge: 2023-03-05 | Disposition: A | Discharge status: Home / Self Care | Payer: 59 | Source: Ambulatory Visit | Attending: Orthopedic Surgery | Admitting: Orthopedic Surgery

## 2023-03-05 ENCOUNTER — Ambulatory Visit (HOSPITAL_COMMUNITY): Payer: 59 | Admitting: Physician Assistant

## 2023-03-05 ENCOUNTER — Ambulatory Visit (HOSPITAL_BASED_OUTPATIENT_CLINIC_OR_DEPARTMENT_OTHER): Payer: 59 | Admitting: Anesthesiology

## 2023-03-05 ENCOUNTER — Encounter (HOSPITAL_COMMUNITY): Payer: Self-pay | Admitting: Orthopedic Surgery

## 2023-03-05 ENCOUNTER — Encounter (HOSPITAL_COMMUNITY)
Admission: RE | Disposition: A | Discharge status: Home / Self Care | Payer: Self-pay | Source: Ambulatory Visit | Attending: Orthopedic Surgery

## 2023-03-05 DIAGNOSIS — Z9989 Dependence on other enabling machines and devices: Secondary | ICD-10-CM

## 2023-03-05 DIAGNOSIS — I10 Essential (primary) hypertension: Secondary | ICD-10-CM

## 2023-03-05 DIAGNOSIS — R7303 Prediabetes: Secondary | ICD-10-CM

## 2023-03-05 DIAGNOSIS — M19012 Primary osteoarthritis, left shoulder: Secondary | ICD-10-CM

## 2023-03-05 DIAGNOSIS — G4733 Obstructive sleep apnea (adult) (pediatric): Secondary | ICD-10-CM | POA: Insufficient documentation

## 2023-03-05 DIAGNOSIS — K219 Gastro-esophageal reflux disease without esophagitis: Secondary | ICD-10-CM | POA: Insufficient documentation

## 2023-03-05 DIAGNOSIS — I739 Peripheral vascular disease, unspecified: Secondary | ICD-10-CM | POA: Insufficient documentation

## 2023-03-05 DIAGNOSIS — M75102 Unspecified rotator cuff tear or rupture of left shoulder, not specified as traumatic: Secondary | ICD-10-CM | POA: Diagnosis not present

## 2023-03-05 DIAGNOSIS — Z79899 Other long term (current) drug therapy: Secondary | ICD-10-CM | POA: Insufficient documentation

## 2023-03-05 DIAGNOSIS — G8918 Other acute postprocedural pain: Secondary | ICD-10-CM | POA: Diagnosis not present

## 2023-03-05 DIAGNOSIS — G4730 Sleep apnea, unspecified: Secondary | ICD-10-CM | POA: Diagnosis not present

## 2023-03-05 DIAGNOSIS — Z96612 Presence of left artificial shoulder joint: Secondary | ICD-10-CM | POA: Diagnosis not present

## 2023-03-05 HISTORY — PX: REVERSE SHOULDER ARTHROPLASTY: SHX5054

## 2023-03-05 SURGERY — ARTHROPLASTY, SHOULDER, TOTAL, REVERSE
Anesthesia: Regional | Site: Shoulder | Laterality: Left

## 2023-03-05 MED ORDER — FENTANYL CITRATE PF 50 MCG/ML IJ SOSY
50.0000 ug | PREFILLED_SYRINGE | Freq: Once | INTRAMUSCULAR | Status: AC
  Administered 2023-03-05: 50 ug via INTRAVENOUS
  Filled 2023-03-05: qty 2

## 2023-03-05 MED ORDER — OXYCODONE HCL 5 MG PO TABS: 5.0000 mg | ORAL_TABLET | ORAL | Status: DC | PRN

## 2023-03-05 MED ORDER — 0.9 % SODIUM CHLORIDE (POUR BTL) OPTIME
TOPICAL | Status: DC | PRN
  Administered 2023-03-05: 1000 mL

## 2023-03-05 MED ORDER — MIDAZOLAM HCL 2 MG/2ML IJ SOLN
INTRAMUSCULAR | Status: AC
  Filled 2023-03-05: qty 2

## 2023-03-05 MED ORDER — DEXAMETHASONE SODIUM PHOSPHATE 10 MG/ML IJ SOLN
INTRAMUSCULAR | Status: DC | PRN
  Administered 2023-03-05: 5 mg via INTRAVENOUS

## 2023-03-05 MED ORDER — FENTANYL CITRATE PF 50 MCG/ML IJ SOSY
PREFILLED_SYRINGE | INTRAMUSCULAR | Status: AC
  Administered 2023-03-05: 25 ug via INTRAVENOUS
  Filled 2023-03-05: qty 1

## 2023-03-05 MED ORDER — FENTANYL CITRATE PF 50 MCG/ML IJ SOSY
PREFILLED_SYRINGE | INTRAMUSCULAR | Status: AC
  Administered 2023-03-05: 50 ug via INTRAVENOUS
  Filled 2023-03-05: qty 1

## 2023-03-05 MED ORDER — OXYCODONE HCL 5 MG PO TABS
10.0000 mg | ORAL_TABLET | ORAL | Status: DC | PRN
  Administered 2023-03-05: 10 mg via ORAL

## 2023-03-05 MED ORDER — LACTATED RINGERS IV SOLN: INTRAVENOUS | Status: DC

## 2023-03-05 MED ORDER — ONDANSETRON HCL 4 MG/2ML IJ SOLN: 4.0000 mg | Freq: Once | INTRAMUSCULAR | Status: DC | PRN

## 2023-03-05 MED ORDER — OXYCODONE HCL 5 MG PO TABS
ORAL_TABLET | ORAL | Status: AC
  Filled 2023-03-05: qty 2

## 2023-03-05 MED ORDER — WATER FOR IRRIGATION, STERILE IR SOLN
Status: DC | PRN
  Administered 2023-03-05: 2000 mL

## 2023-03-05 MED ORDER — SUGAMMADEX SODIUM 200 MG/2ML IV SOLN
INTRAVENOUS | Status: DC | PRN
  Administered 2023-03-05: 200 mg via INTRAVENOUS

## 2023-03-05 MED ORDER — PROPOFOL 10 MG/ML IV BOLUS
INTRAVENOUS | Status: DC | PRN
  Administered 2023-03-05: 120 mg via INTRAVENOUS
  Administered 2023-03-05: 50 mg via INTRAVENOUS

## 2023-03-05 MED ORDER — BUPIVACAINE LIPOSOME 1.3 % IJ SUSP
INTRAMUSCULAR | Status: DC | PRN
  Administered 2023-03-05: 10 mL via PERINEURAL

## 2023-03-05 MED ORDER — ONDANSETRON HCL 4 MG/2ML IJ SOLN
INTRAMUSCULAR | Status: AC
  Filled 2023-03-05: qty 2

## 2023-03-05 MED ORDER — TRANEXAMIC ACID-NACL 1000-0.7 MG/100ML-% IV SOLN
1000.0000 mg | INTRAVENOUS | Status: AC
  Administered 2023-03-05: 1000 mg via INTRAVENOUS
  Filled 2023-03-05: qty 100

## 2023-03-05 MED ORDER — FENTANYL CITRATE (PF) 100 MCG/2ML IJ SOLN
INTRAMUSCULAR | Status: DC | PRN
  Administered 2023-03-05: 75 ug via INTRAVENOUS
  Administered 2023-03-05: 25 ug via INTRAVENOUS

## 2023-03-05 MED ORDER — PHENYLEPHRINE HCL (PRESSORS) 10 MG/ML IV SOLN
INTRAVENOUS | Status: AC
  Filled 2023-03-05: qty 1

## 2023-03-05 MED ORDER — LIDOCAINE HCL (CARDIAC) PF 100 MG/5ML IV SOSY
PREFILLED_SYRINGE | INTRAVENOUS | Status: DC | PRN
  Administered 2023-03-05: 60 mg via INTRAVENOUS

## 2023-03-05 MED ORDER — ORAL CARE MOUTH RINSE: 15.0000 mL | Freq: Once | OROMUCOSAL | Status: AC

## 2023-03-05 MED ORDER — BUPIVACAINE HCL (PF) 0.5 % IJ SOLN
INTRAMUSCULAR | Status: DC | PRN
  Administered 2023-03-05: 15 mL via PERINEURAL

## 2023-03-05 MED ORDER — OXYCODONE HCL 5 MG PO TABS: 5.0000 mg | ORAL_TABLET | ORAL | 0 refills | Status: DC | PRN

## 2023-03-05 MED ORDER — SODIUM CHLORIDE 0.9 % IR SOLN
Status: DC | PRN
  Administered 2023-03-05: 1000 mL

## 2023-03-05 MED ORDER — PROPOFOL 10 MG/ML IV BOLUS
INTRAVENOUS | Status: AC
  Filled 2023-03-05: qty 20

## 2023-03-05 MED ORDER — ACETAMINOPHEN 10 MG/ML IV SOLN
1000.0000 mg | Freq: Once | INTRAVENOUS | Status: DC | PRN
  Administered 2023-03-05: 1000 mg via INTRAVENOUS

## 2023-03-05 MED ORDER — FENTANYL CITRATE PF 50 MCG/ML IJ SOSY
25.0000 ug | PREFILLED_SYRINGE | INTRAMUSCULAR | Status: DC | PRN
  Administered 2023-03-05: 25 ug via INTRAVENOUS
  Administered 2023-03-05: 50 ug via INTRAVENOUS

## 2023-03-05 MED ORDER — ROCURONIUM BROMIDE 10 MG/ML (PF) SYRINGE
PREFILLED_SYRINGE | INTRAVENOUS | Status: AC
  Filled 2023-03-05: qty 10

## 2023-03-05 MED ORDER — CEFAZOLIN SODIUM-DEXTROSE 2-4 GM/100ML-% IV SOLN
2.0000 g | INTRAVENOUS | Status: AC
  Administered 2023-03-05: 2 g via INTRAVENOUS
  Filled 2023-03-05: qty 100

## 2023-03-05 MED ORDER — FENTANYL CITRATE (PF) 100 MCG/2ML IJ SOLN
INTRAMUSCULAR | Status: AC
  Filled 2023-03-05: qty 2

## 2023-03-05 MED ORDER — ACETAMINOPHEN 10 MG/ML IV SOLN
INTRAVENOUS | Status: AC
  Filled 2023-03-05: qty 100

## 2023-03-05 MED ORDER — MIDAZOLAM HCL 2 MG/2ML IJ SOLN
1.0000 mg | Freq: Once | INTRAMUSCULAR | Status: AC
  Administered 2023-03-05: 2 mg via INTRAVENOUS
  Filled 2023-03-05: qty 2

## 2023-03-05 MED ORDER — DEXAMETHASONE SODIUM PHOSPHATE 10 MG/ML IJ SOLN
INTRAMUSCULAR | Status: AC
  Filled 2023-03-05: qty 1

## 2023-03-05 MED ORDER — TIZANIDINE HCL 4 MG PO TABS: 2.0000 mg | ORAL_TABLET | Freq: Every day | ORAL | 0 refills | Status: DC

## 2023-03-05 MED ORDER — ONDANSETRON HCL 4 MG/2ML IJ SOLN
INTRAMUSCULAR | Status: DC | PRN
  Administered 2023-03-05: 4 mg via INTRAVENOUS

## 2023-03-05 MED ORDER — CHLORHEXIDINE GLUCONATE 0.12 % MT SOLN
15.0000 mL | Freq: Once | OROMUCOSAL | Status: AC
  Administered 2023-03-05: 15 mL via OROMUCOSAL

## 2023-03-05 MED ORDER — ROCURONIUM BROMIDE 100 MG/10ML IV SOLN
INTRAVENOUS | Status: DC | PRN
  Administered 2023-03-05: 70 mg via INTRAVENOUS

## 2023-03-05 SURGICAL SUPPLY — 77 items
AID PSTN UNV HD RSTRNT DISP (MISCELLANEOUS) ×1
BAG COUNTER SPONGE SURGICOUNT (BAG) IMPLANT
BAG SPEC THK2 15X12 ZIP CLS (MISCELLANEOUS) ×1
BAG SPNG CNTER NS LX DISP (BAG)
BAG ZIPLOCK 12X15 (MISCELLANEOUS) ×1 IMPLANT
BASEPLATE P2 COATD GLND 6.5X30 (Shoulder) IMPLANT
BIT DRILL 1.6MX128 (BIT) IMPLANT
BIT DRILL 2.5 DIA 127 CALI (BIT) IMPLANT
BIT DRILL 4 DIA CALIBRATED (BIT) IMPLANT
BLADE SAW SGTL 73X25 THK (BLADE) ×1 IMPLANT
BOOTIES KNEE HIGH SLOAN (MISCELLANEOUS) ×2 IMPLANT
BSPLAT GLND 30 STRL LF SHLDR (Shoulder) ×1 IMPLANT
CLSR STERI-STRIP ANTIMIC 1/2X4 (GAUZE/BANDAGES/DRESSINGS) IMPLANT
COOLER ICEMAN CLASSIC (MISCELLANEOUS) IMPLANT
COVER BACK TABLE 60X90IN (DRAPES) ×1 IMPLANT
COVER SURGICAL LIGHT HANDLE (MISCELLANEOUS) ×1 IMPLANT
DRAPE INCISE IOBAN 66X45 STRL (DRAPES) ×1 IMPLANT
DRAPE ORTHO SPLIT 77X108 STRL (DRAPES) ×2
DRAPE POUCH INSTRU U-SHP 10X18 (DRAPES) ×1 IMPLANT
DRAPE SHEET LG 3/4 BI-LAMINATE (DRAPES) ×1 IMPLANT
DRAPE SURG 17X11 SM STRL (DRAPES) ×1 IMPLANT
DRAPE SURG ORHT 6 SPLT 77X108 (DRAPES) ×2 IMPLANT
DRAPE TOP 10253 STERILE (DRAPES) ×1 IMPLANT
DRAPE U-SHAPE 47X51 STRL (DRAPES) ×1 IMPLANT
DRSG AQUACEL AG ADV 3.5X 6 (GAUZE/BANDAGES/DRESSINGS) ×1 IMPLANT
DURAPREP 26ML APPLICATOR (WOUND CARE) ×2 IMPLANT
ELECT BLADE TIP CTD 4 INCH (ELECTRODE) ×1 IMPLANT
ELECT REM PT RETURN 15FT ADLT (MISCELLANEOUS) ×1 IMPLANT
FACESHIELD WRAPAROUND (MASK) ×1 IMPLANT
FACESHIELD WRAPAROUND OR TEAM (MASK) ×1 IMPLANT
GLOVE BIO SURGEON STRL SZ7.5 (GLOVE) ×1 IMPLANT
GLOVE BIOGEL PI IND STRL 6.5 (GLOVE) ×1 IMPLANT
GLOVE BIOGEL PI IND STRL 8 (GLOVE) ×1 IMPLANT
GLOVE SURG SS PI 6.5 STRL IVOR (GLOVE) ×1 IMPLANT
GOWN STRL REUS W/ TWL LRG LVL3 (GOWN DISPOSABLE) ×1 IMPLANT
GOWN STRL REUS W/ TWL XL LVL3 (GOWN DISPOSABLE) ×1 IMPLANT
GOWN STRL REUS W/TWL LRG LVL3 (GOWN DISPOSABLE) ×1
GOWN STRL REUS W/TWL XL LVL3 (GOWN DISPOSABLE) ×1
HANDPIECE INTERPULSE COAX TIP (DISPOSABLE) ×1
HOOD PEEL AWAY T7 (MISCELLANEOUS) ×3 IMPLANT
INSERT SMALL SOCKET 32MM NEU (Insert) IMPLANT
KIT BASIN OR (CUSTOM PROCEDURE TRAY) ×1 IMPLANT
KIT TURNOVER KIT A (KITS) IMPLANT
MANIFOLD NEPTUNE II (INSTRUMENTS) ×1 IMPLANT
NDL TROCAR POINT SZ 2 1/2 (NEEDLE) IMPLANT
NEEDLE TROCAR POINT SZ 2 1/2 (NEEDLE) ×1 IMPLANT
NS IRRIG 1000ML POUR BTL (IV SOLUTION) ×1 IMPLANT
P2 COATDE GLNOID BSEPLT 6.5X30 (Shoulder) ×1 IMPLANT
PACK SHOULDER (CUSTOM PROCEDURE TRAY) ×1 IMPLANT
PAD COLD SHLDR WRAP-ON (PAD) IMPLANT
PROTECTOR NERVE ULNAR (MISCELLANEOUS) IMPLANT
RESTRAINT HEAD UNIVERSAL NS (MISCELLANEOUS) ×1 IMPLANT
RETRIEVER SUT HEWSON (MISCELLANEOUS) IMPLANT
SCREW BONE LOCKING RSP 5.0X14 (Screw) ×1 IMPLANT
SCREW BONE LOCKING RSP 5.0X30 (Screw) ×2 IMPLANT
SCREW BONE RSP LOCK 5X14 (Screw) IMPLANT
SCREW BONE RSP LOCK 5X30 (Screw) IMPLANT
SCREW RETAIN W/HEAD 4MM OFFSET (Shoulder) IMPLANT
SET HNDPC FAN SPRY TIP SCT (DISPOSABLE) ×1 IMPLANT
SLING ARM IMMOBILIZER LRG (SOFTGOODS) IMPLANT
SLING ARM IMMOBILIZER MED (SOFTGOODS) IMPLANT
STEM HUMERAL 8X48 SHOULDER (Miscellaneous) IMPLANT
STRIP CLOSURE SKIN 1/2X4 (GAUZE/BANDAGES/DRESSINGS) ×2 IMPLANT
SUCTION FRAZIER HANDLE 10FR (MISCELLANEOUS)
SUCTION TUBE FRAZIER 10FR DISP (MISCELLANEOUS) IMPLANT
SUPPORT WRAP ARM LG (MISCELLANEOUS) ×1 IMPLANT
SUT ETHIBOND 2 V 37 (SUTURE) IMPLANT
SUT FIBERWIRE #2 38 REV NDL BL (SUTURE)
SUT MNCRL AB 4-0 PS2 18 (SUTURE) ×1 IMPLANT
SUT VIC AB 2-0 CT1 27 (SUTURE) ×2
SUT VIC AB 2-0 CT1 TAPERPNT 27 (SUTURE) ×2 IMPLANT
SUTURE FIBERWR#2 38 REV NDL BL (SUTURE) IMPLANT
TAPE LABRALWHITE 1.5X36 (TAPE) IMPLANT
TAPE SUT LABRALTAP WHT/BLK (SUTURE) IMPLANT
TOWEL OR 17X26 10 PK STRL BLUE (TOWEL DISPOSABLE) ×1 IMPLANT
TOWEL OR NON WOVEN STRL DISP B (DISPOSABLE) ×1 IMPLANT
WATER STERILE IRR 1000ML POUR (IV SOLUTION) ×1 IMPLANT

## 2023-03-05 NOTE — Evaluation (Signed)
Occupational Therapy Evaluation Patient Details Name: Pamela Hendricks MRN: [id number] DOB: 08/22/1946 Today's Date: 03/05/2023   History of Present Illness 77 y.o. female s/p L shoulder arthroplasty. PMH significant for arthritis, breast cancer, CAD, OSA, HLD, HTN.   Clinical Impression   PTA, pt lived alone independently. Upon eval, pt requiring up to min guard A for LB ADL and transfers and min A for UB ADL. Suspect min guard needed with transfers due to s.p anesthesia; educated daughter in law to stay close by until balance improving with greater amount of time out from anesthesia. Pt and daughter in law educated regarding compensatory techniques for sling application/position, UB ADL, shower transfers, sleep position, stair training. Pt educated regarding recommended elbow/wrist/hand ROM, shoulder precautions, and NWB status. Recommending follow physician orders for follow up. Pt to benefit from [facility] for night time use due to increased fall risk with mobility during the night.       Recommendations for follow up therapy are one component of a multi-disciplinary discharge planning process, led by the attending physician.  Recommendations may be updated based on patient status, additional functional criteria and insurance authorization.   Assistance Recommended at Discharge Intermittent Supervision/Assistance  Patient can return home with the following A little help with walking and/or transfers;A little help with bathing/dressing/bathroom;Help with stairs or ramp for entrance;Assist for transportation;Assistance with cooking/housework    Functional Status Assessment  Patient has had a recent decline in their functional status and demonstrates the ability to make significant improvements in function in a reasonable and predictable amount of time.  Equipment Recommendations  BSC/3in1;Other (comment) (cane)    Recommendations for Other Services       Precautions / Restrictions  Precautions Precautions: Shoulder Type of Shoulder Precautions: NWB, no ROM shoulder, OK for AROM elbow/wrist/hand, sling at all times Shoulder Interventions: Shoulder sling/immobilizer;At all times Precaution Booklet Issued: Yes (comment) Precaution Comments: All precautions reviewed during ADL and exercises elbow/wrist/hand reviewed Restrictions Weight Bearing Restrictions: Yes LUE Weight Bearing: Non weight bearing      Mobility Bed Mobility                    Transfers Overall transfer level: Needs assistance Equipment used: None Transfers: Sit to/from Stand Sit to Stand: Supervision, Min guard           General transfer comment: supervision to min guard A due to decr balance      Balance Overall balance assessment: Mild deficits observed, not formally tested                                         ADL either performed or assessed with clinical judgement   ADL Overall ADL's : Needs assistance/impaired Eating/Feeding: Set up   Grooming: Supervision/safety;Standing   Upper Body Bathing: Min guard;Standing   Lower Body Bathing: Supervison/ safety;Sit to/from stand   Upper Body Dressing : Minimal assistance;Sitting   Lower Body Dressing: Minimal assistance;Sit to/from stand Lower Body Dressing Details (indicate cue type and reason): to pull underpants over L hip, but no issue with pants. Pt reporting she will wear looser underpants at home or none Toilet Transfer: Supervision/safety;Min guard Toilet Transfer Details (indicate cue type and reason): Up to min guard due to mildly wobbly s/p anesthesia Toileting- Clothing Manipulation and Hygiene: Modified independent;Sitting/lateral lean   Tub/ Shower Transfer: Tub Teacher, English as a foreign language Details (indicate cue type and reason): Educated  regarding compensatory techniques Functional mobility during ADLs: Supervision/safety;Min guard General ADL Comments: daughter in law to be  around this afternoon     Vision Baseline Vision/History: 1 Wears glasses Ability to See in Adequate Light: 0 Adequate Patient Visual Report: No change from baseline Vision Assessment?: No apparent visual deficits     Perception     Praxis      Pertinent Vitals/Pain Pain Assessment Pain Assessment: No/denies pain     Hand Dominance     Extremity/Trunk Assessment Upper Extremity Assessment Upper Extremity Assessment: LUE deficits/detail LUE Deficits / Details: Nerve block still active. Able to flex/ext digits, perform AROM wrist flexion/extension and ~50% pronation/supination   Lower Extremity Assessment Lower Extremity Assessment: Generalized weakness       Communication Communication Communication: No difficulties   Cognition Arousal/Alertness: Awake/alert Behavior During Therapy: WFL for tasks assessed/performed Overall Cognitive Status: Within Functional Limits for tasks assessed                                       General Comments  VSS daughter in law present    Exercises Exercises: Shoulder Shoulder Exercises Elbow Flexion: Left, 10 reps, AAROM Elbow Extension: Left, 10 reps, AAROM Wrist Flexion: AROM, Left, 10 reps Wrist Extension: AROM, Left, 10 reps Digit Composite Flexion: AROM, Left, 10 reps Composite Extension: AROM, Left, 10 reps Neck Flexion: AROM, 5 reps Neck Extension: AROM, 5 reps Neck Lateral Flexion - Right: AROM, 5 reps Neck Lateral Flexion - Left: AROM, 5 reps   Shoulder Instructions Shoulder Instructions Donning/doffing shirt without moving shoulder: Minimal assistance;Patient able to independently direct caregiver Method for sponge bathing under operated UE: Min-guard Donning/doffing sling/immobilizer: Minimal assistance;Patient able to independently direct caregiver Correct positioning of sling/immobilizer: Modified independent ROM for elbow, wrist and digits of operated UE: Modified independent Sling wearing  schedule (on at all times/off for ADL's): Modified independent Proper positioning of operated UE when showering: Modified independent Positioning of UE while sleeping: Modified independent    Home Living Family/patient expects to be discharged to:: Private residence Living Arrangements: Alone Available Help at Discharge: Family Type of Home: House Home Access: Stairs to enter Secretary/administrator of Steps: 3 Entrance Stairs-Rails: None Home Layout: One level     Bathroom Shower/Tub: Chief Strategy Officer: Standard     Home Equipment: Agricultural consultant (2 wheels);Shower seat          Prior Functioning/Environment Prior Level of Function : Independent/Modified Independent             Mobility Comments: No AD ADLs Comments: indep in ADL and IADL        OT Problem List: Decreased strength;Impaired balance (sitting and/or standing);Decreased activity tolerance;Decreased range of motion;Decreased safety awareness;Decreased knowledge of use of DME or AE;Decreased knowledge of precautions;Impaired UE functional use      OT Treatment/Interventions:      OT Goals(Current goals can be found in the care plan section) Acute Rehab OT Goals Patient Stated Goal: go home OT Goal Formulation: With patient Time For Goal Achievement: 03/19/23 Potential to Achieve Goals: Good  OT Frequency:      Co-evaluation              AM-PAC OT "6 Clicks" Daily Activity     Outcome Measure Help from another person eating meals?: A Little Help from another person taking care of personal grooming?: A Little Help from another person  toileting, which includes using toliet, bedpan, or urinal?: A Little Help from another person bathing (including washing, rinsing, drying)?: A Little Help from another person to put on and taking off regular upper body clothing?: A Little Help from another person to put on and taking off regular lower body clothing?: A Little 6 Click Score: 18    End of Session Equipment Utilized During Treatment: Other (comment) (L shoulder sling) Nurse Communication: Mobility status  Activity Tolerance: Patient tolerated treatment well Patient left: in chair;with family/visitor present  OT Visit Diagnosis: Unsteadiness on feet (R26.81);Muscle weakness (generalized) (M62.81)                Time: 1914-7829 OT Time Calculation (min): 52 min Charges:  OT General Charges $OT Visit: 1 Visit OT Evaluation $OT Eval Low Complexity: 1 Low OT Treatments $Self Care/Home Management : 23-37 mins  Tyler Deis, OTR/L [facility] Acute Rehabilitation Office: [phone]   Pamela Hendricks 03/05/2023, 2:33 PM

## 2023-03-05 NOTE — Anesthesia Procedure Notes (Addendum)
Anesthesia Regional Block: Interscalene brachial plexus block   Pre-Anesthetic Checklist: , timeout performed,  Correct Patient, Correct Site, Correct Laterality,  Correct Procedure, Correct Position, site marked,  Risks and benefits discussed,  Surgical consent,  Pre-op evaluation,  At surgeon's request and post-op pain management  Laterality: Upper and Left  Prep: Maximum Sterile Barrier Precautions used, chloraprep       Needles:  Injection technique: Single-shot  Needle Type: Echogenic Needle     Needle Length: 5cm  Needle Gauge: 21     Additional Needles:   Procedures:,,,, ultrasound used (permanent image in chart),,    Narrative:  Start time: 03/05/2023 8:50 AM End time: 03/05/2023 8:57 AM Injection made incrementally with aspirations every 5 mL.  Performed by: Personally  Anesthesiologist: [name], MD  Additional Notes: Block assessed prior to procedure. Patient tolerated procedure well.

## 2023-03-05 NOTE — Discharge Instructions (Addendum)

## 2023-03-05 NOTE — Op Note (Signed)
Procedure(s): REVERSE SHOULDER ARTHROPLASTY Procedure Note  Pamela Hendricks female 77 y.o. 03/05/2023  Preoperative diagnosis: Left shoulder end-stage osteoarthritis with significant rotator cuff disease  Postoperative diagnosis: Same  Procedure(s) and Anesthesia Type:    * REVERSE SHOULDER ARTHROPLASTY - General   Indications:  77 y.o. female  With endstage left shoulder arthritis with rotator cuff pathology. Pain and dysfunction interfered with quality of life and nonoperative treatment with activity modification, NSAIDS and injections failed.     Surgeon: Glennon Hamilton   Assistants: Fredia Sorrow PA-C [name] was present and scrubbed throughout the procedure and was essential in positioning, retraction, exposure, and closure)  Anesthesia: General endotracheal anesthesia with preoperative interscalene block given by the attending anesthesiologist    Procedure Detail  REVERSE SHOULDER ARTHROPLASTY   Estimated Blood Loss:  200 mL         Drains: none  Blood Given: none          Specimens: none        Complications:  * No complications entered in OR log *         Disposition: PACU - hemodynamically stable.         Condition: stable      OPERATIVE FINDINGS:  A DJO Altivate pressfit reverse total shoulder arthroplasty was placed with a  size 8 stem, a 32-4 glenosphere, and a standard-mm poly insert. The base plate  fixation was excellent.  PROCEDURE: The patient was identified in the preoperative holding area  where I personally marked the operative site after verifying site, side,  and procedure with the patient. An interscalene block given by  the attending anesthesiologist in the holding area and the patient was taken back to the operating room where all extremities were  carefully padded in position after general anesthesia was induced. She  was placed in a beach-chair position and the operative upper extremity was  prepped and draped in a standard  sterile fashion. An approximately 10-  cm incision was made from the tip of the coracoid process to the center  point of the humerus at the level of the axilla. Dissection was carried  down through subcutaneous tissues to the level of the cephalic vein  which was taken laterally with the deltoid. The pectoralis major was  retracted medially. The subdeltoid space was developed and the lateral  edge of the conjoined tendon was identified. The undersurface of  conjoined tendon was palpated and the musculocutaneous nerve was not in  the field. Retractor was placed underneath the conjoined and second  retractor was placed lateral into the deltoid. The circumflex humeral  artery and vessels were identified and clamped and coagulated. The  biceps tendon was tenotomized.  The subscapularis was taken down as a peel with the underlying capsule.  The  joint was then gently externally rotated while the capsule was released  from the humeral neck around to just beyond the 6 o'clock position. At  this point, the joint was dislocated and the humeral head was presented  into the wound. The excessive osteophyte formation was removed with a  large rongeur.  The cutting guide was used to make the appropriate  head cut and the head was saved for potentially bone grafting.  The glenoid was exposed with the arm in an  abducted extended position. The anterior and posterior labrum were  completely excised and the capsule was released circumferentially to  allow for exposure of the glenoid for preparation. The 2.5 mm drill was  placed  using the guide in 5-10 inferior angulation and the tap was then advanced in the same hole. Small and large reamers were then used. The tap was then removed and the Metaglene was then screwed in with excellent purchase.  The peripheral guide was then used to drilled measured and filled peripheral locking screws. The size 32-4 glenosphere was then impacted on the [facility] taper and the  central screw was placed. The humerus was then again exposed and the diaphyseal reamers were used followed by the metaphyseal reamers. The final broach was left in place in the proximal trial was placed. The joint was reduced and with this implant it was felt that soft tissue tensioning was appropriate with excellent stability and excellent range of motion. Therefore, final humeral stem was placed press-fit.  And then the trial polyethylene inserts were tested again and the above implant was felt to be the most appropriate for final insertion. The joint was reduced taken through full range of motion and felt to be stable. Soft tissue tension was appropriate.  The joint was then copiously irrigated with pulse  lavage and the wound was then closed. The subscapularis was not repaired.  Skin was closed with 2-0 Vicryl in a deep dermal layer and 4-0  Monocryl for skin closure. Steri-Strips were applied. Sterile  dressings were then applied as well as a sling. The patient was allowed  to awaken from general anesthesia, transferred to stretcher, and taken  to recovery room in stable condition.   POSTOPERATIVE PLAN: The patient will be observed in the recovery room and if her pain is well-controlled with regional anesthesia and she is hemodynamically stable she can be discharged home today with family.

## 2023-03-05 NOTE — Transfer of Care (Signed)
Immediate Anesthesia Transfer of Care Note  Patient: Pamela Hendricks  Procedure(s) Performed: REVERSE SHOULDER ARTHROPLASTY (Left: Shoulder)  Patient Location: PACU  Anesthesia Type:GA combined with regional for post-op pain  Level of Consciousness: awake, alert , oriented, and patient cooperative  Airway & Oxygen Therapy: Patient Spontanous Breathing and Patient connected to face mask oxygen  Post-op Assessment: Report given to RN and Post -op Vital signs reviewed and stable  Post vital signs: Reviewed and stable  Last Vitals:  Vitals Value Taken Time  BP 152/94 03/05/23 1103  Temp    Pulse 79 03/05/23 1105  Resp 15 03/05/23 1105  SpO2 100 % 03/05/23 1105  Vitals shown include unvalidated device data.  Last Pain:  Vitals:   03/05/23 0736  TempSrc:   PainSc: 5          Complications: No notable events documented.

## 2023-03-05 NOTE — H&P (Signed)
Pamela Hendricks is an 77 y.o. female.   Chief Complaint: Left shoulder pain and dysfunction HPI: Endstage left shoulder arthritis with significant pain and dysfunction, failed conservative measures.  Pain interferes with sleep and quality of life.   Past Medical History:  Diagnosis Date   Anemia    Arthritis    Breast cancer    Carotid artery disease    a. duplex 07/2017 - 1-39% stenosis bilaterally.   Chronic frontal sinusitis    Elevated liver enzymes    per pt   Environmental and seasonal allergies    GERD (gastroesophageal reflux disease)    Hyperlipidemia    Hypertension    Migraines    sinus headaches, no longer having migraines   OSA (obstructive sleep apnea)    CPAP   Peripheral neuropathy    Peripheral vascular disease    carotid blockage - is followed by Dr. Anne Fu   Pre-diabetes    Skin cancer    Leg   Snores    Wears glasses     Past Surgical History:  Procedure Laterality Date   ABDOMINAL HYSTERECTOMY  1970   Total HYST   BREAST BIOPSY Bilateral 2001   benign   BREAST BIOPSY Right 10/13/2022   MM RT BREAST BX W LOC DEV 1ST LESION IMAGE BX SPEC STEREO GUIDE 10/13/2022 [facility]   BREAST BIOPSY Right 10/13/2022   MM RT BREAST BX W LOC DEV EA AD LESION IMG BX SPEC STEREO GUIDE 10/13/2022 [facility]   BREAST BIOPSY  11/19/2022   MM RT RADIOACTIVE SEED LOC MAMMO GUIDE 11/19/2022 [facility]   BREAST EXCISIONAL BIOPSY Right 2013   sclerosing lesion   BREAST LUMPECTOMY WITH RADIOACTIVE SEED AND SENTINEL LYMPH NODE BIOPSY Right 11/20/2022   Procedure: RIGHT BREAST LUMPECTOMY WITH RADIOACTIVE SEED AND SENTINEL LYMPH NODE BIOPSY;  Surgeon: Griselda Miner, MD;  Location: [facility];  Service: General;  Laterality: Right;   BREAST LUMPECTOMY WITH RADIOACTIVE SEED LOCALIZATION Left 08/31/2018   Procedure: BREAST LUMPECTOMY WITH RADIOACTIVE SEED LOCALIZATION;  Surgeon: Claud Kelp, MD;  Location: [facility];  Service: General;   Laterality: Left;   COLONOSCOPY     EVALUATION UNDER ANESTHESIA WITH HEMORRHOIDECTOMY N/A 09/18/2022   Procedure: ANORECTAL EXAM UNDER ANESTHESIA WITH HEMORRHOIDECTOMY WITH LIGATION AND HEMORRHOIDOPEXY X3;  Surgeon: Karie Soda, MD;  Location: [facility];  Service: General;  Laterality: N/A;  GEN AND LOCAL   JOINT REPLACEMENT Left 2001   Left Knee Replacement   KNEE ARTHROSCOPY Left    lt   NASAL SINUS SURGERY     NASAL SINUS SURGERY Bilateral 09/30/2019   Procedure: ENDOSCOPIC SINUS SURGERY;  Surgeon: Osborn Coho, MD;  Location: [facility];  Service: ENT;  Laterality: Bilateral;   TOE SURGERY     left foot digit # 2   TOTAL KNEE ARTHROPLASTY Right 10/12/2018   Procedure: TOTAL KNEE ARTHROPLASTY;  Surgeon: Marcene Corning, MD;  Location: [facility];  Service: Orthopedics;  Laterality: Right;   WISDOM TOOTH EXTRACTION      Family History  Problem Relation Age of Onset   Cancer Mother    Hypertension Mother    Hypertension Father    Heart disease Father        MI around 37, died at 12   Cancer Brother        Prostate Ca   Cancer Sister        Brain Ca   CAD Daughter        Had bypass  in her 45s (type 1 diabetic), died at 81   Social History:  reports that she has never smoked. She has never used smokeless tobacco. She reports that she does not drink alcohol and does not use drugs.  Allergies:  Allergies  Allergen Reactions   Other Itching and Other (See Comments)    Watery eyes , itching - Grass, Rag Weed, Mold, Smoke   Grass Pollen(K-O-R-T-Swt Vern) Other (See Comments)   Molds & Smuts Cough and Other (See Comments)   Pollen Extract Other (See Comments)    Medications Prior to Admission  Medication Sig Dispense Refill   acetaminophen (TYLENOL) 500 MG tablet Take 1,000 mg by mouth every 6 (six) hours as needed for mild pain or headache.     anastrozole (ARIMIDEX) 1 MG tablet Take 1 tablet (1 mg total) by mouth daily. 90 tablet 3   aspirin EC 81 MG tablet Take 81 mg by mouth  daily.     atorvastatin (LIPITOR) 10 MG tablet Take 10 mg by mouth at bedtime.     benazepril (LOTENSIN) 40 MG tablet Take 40 mg by mouth daily.     Calcium Carb-Cholecalciferol (CALCIUM 600+D3 PO) Take 1 tablet by mouth 2 (two) times daily.     celecoxib (CELEBREX) 200 MG capsule Take 200 mg by mouth daily.     cetirizine (ZYRTEC) 10 MG chewable tablet Chew 10 mg by mouth daily.     clobetasol ointment (TEMOVATE) 0.05 % Apply 1 application topically 2 (two) times daily.     estradiol (ESTRACE) 0.1 MG/GM vaginal cream Place 0.5 Applicatorfuls vaginally at bedtime.     fluocinonide cream (LIDEX) 0.05 % Apply 1 Application topically daily.     fluticasone (FLONASE) 50 MCG/ACT nasal spray Place 1 spray into both nostrils daily.     hydrochlorothiazide (HYDRODIURIL) 25 MG tablet Take 25 mg by mouth daily.     LINZESS 145 MCG CAPS capsule Take 145 mcg by mouth daily as needed (constipation).     loratadine (CLARITIN) 10 MG tablet Take 10 mg by mouth daily.     mirabegron ER (MYRBETRIQ) 50 MG TB24 tablet Take 50 mg by mouth daily.     Multiple Vitamins-Minerals (MULTIVITAMIN WITH MINERALS) tablet Take 1 tablet by mouth daily.     nortriptyline (PAMELOR) 25 MG capsule TAKE 2 CAPSULES(50 MG) BY MOUTH AT BEDTIME 180 capsule 4   pantoprazole (PROTONIX) 40 MG tablet Take 40 mg by mouth daily.      Polyethyl Glycol-Propyl Glycol (SYSTANE) 0.4-0.3 % SOLN Place 1 drop into both eyes in the morning.     NON FORMULARY Pt uses a cpap nightly      No results found for this or any previous visit (from the past 48 hour(s)). No results found.  Review of Systems  All other systems reviewed and are negative.   Blood pressure (!) 108/90, pulse 77, temperature 98.1 F (36.7 C), temperature source Oral, resp. rate (!) 9, weight 89.4 kg, SpO2 96 %. Physical Exam HENT:     Head: Atraumatic.  Eyes:     Extraocular Movements: Extraocular movements intact.  Cardiovascular:     Pulses: Normal pulses.   Pulmonary:     Effort: Pulmonary effort is normal.  Musculoskeletal:     Comments: Left shoulder pain with limited range of motion,NVID  Neurological:     Mental Status: She is alert.      Assessment/Plan Left shoulder end-stage osteoarthritis with associated rotator cuff disease Plan left reverse total shoulder arthroplasty  Risks / benefits of surgery discussed Consent on chart  NPO for OR Preop antibiotics   Glennon Hamilton, MD 03/05/2023, 8:49 AM

## 2023-03-05 NOTE — Anesthesia Postprocedure Evaluation (Signed)
Anesthesia Post Note  Patient: Pamela Hendricks  Procedure(s) Performed: REVERSE SHOULDER ARTHROPLASTY (Left: Shoulder)     Patient location during evaluation: PACU Anesthesia Type: Regional and General Level of consciousness: awake and alert Pain management: pain level controlled Vital Signs Assessment: post-procedure vital signs reviewed and stable Respiratory status: spontaneous breathing, nonlabored ventilation, respiratory function stable and patient connected to nasal cannula oxygen Cardiovascular status: blood pressure returned to baseline and stable Postop Assessment: no apparent nausea or vomiting Anesthetic complications: no  No notable events documented.  Last Vitals:  Vitals:   03/05/23 1230 03/05/23 1257  BP: (!) 148/60 135/79  Pulse: 68 71  Resp: 13 12  Temp:  36.4 C  SpO2: 100% 98%    Last Pain:  Vitals:   03/05/23 1257  TempSrc: Oral  PainSc:                  [name]

## 2023-03-05 NOTE — Anesthesia Procedure Notes (Signed)
Procedure Name: Intubation Date/Time: 03/05/2023 9:46 AM  Performed by: Johnette Abraham, CRNAPre-anesthesia Checklist: Patient identified, Emergency Drugs available, Suction available and Patient being monitored Patient Re-evaluated:Patient Re-evaluated prior to induction Oxygen Delivery Method: Circle System Utilized Preoxygenation: Pre-oxygenation with 100% oxygen Induction Type: IV induction Ventilation: Mask ventilation without difficulty Laryngoscope Size: Mac and 3 Grade View: Grade III Tube type: Oral Tube size: 7.0 mm Number of attempts: 1 Airway Equipment and Method: Stylet and Oral airway Placement Confirmation: ETT inserted through vocal cords under direct vision, positive ETCO2 and breath sounds checked- equal and bilateral Secured at: 22 cm Tube secured with: Tape Dental Injury: Teeth and Oropharynx as per pre-operative assessment

## 2023-03-06 ENCOUNTER — Encounter (HOSPITAL_COMMUNITY): Payer: Self-pay | Admitting: Orthopedic Surgery

## 2023-03-20 DIAGNOSIS — M19012 Primary osteoarthritis, left shoulder: Secondary | ICD-10-CM | POA: Diagnosis not present

## 2023-03-20 DIAGNOSIS — Z96612 Presence of left artificial shoulder joint: Secondary | ICD-10-CM | POA: Diagnosis not present

## 2023-03-24 ENCOUNTER — Encounter: Payer: Self-pay | Admitting: Family Medicine

## 2023-04-15 DIAGNOSIS — M19012 Primary osteoarthritis, left shoulder: Secondary | ICD-10-CM | POA: Diagnosis not present

## 2023-04-15 DIAGNOSIS — Z96612 Presence of left artificial shoulder joint: Secondary | ICD-10-CM | POA: Diagnosis not present

## 2023-04-28 DIAGNOSIS — L304 Erythema intertrigo: Secondary | ICD-10-CM | POA: Diagnosis not present

## 2023-04-28 DIAGNOSIS — L900 Lichen sclerosus et atrophicus: Secondary | ICD-10-CM | POA: Diagnosis not present

## 2023-05-20 DIAGNOSIS — M25512 Pain in left shoulder: Secondary | ICD-10-CM | POA: Diagnosis not present

## 2023-05-22 DIAGNOSIS — M25512 Pain in left shoulder: Secondary | ICD-10-CM | POA: Diagnosis not present

## 2023-05-26 DIAGNOSIS — M25512 Pain in left shoulder: Secondary | ICD-10-CM | POA: Diagnosis not present

## 2023-05-28 DIAGNOSIS — M25512 Pain in left shoulder: Secondary | ICD-10-CM | POA: Diagnosis not present

## 2023-06-02 DIAGNOSIS — M25512 Pain in left shoulder: Secondary | ICD-10-CM | POA: Diagnosis not present

## 2023-06-04 DIAGNOSIS — M25512 Pain in left shoulder: Secondary | ICD-10-CM | POA: Diagnosis not present

## 2023-06-16 DIAGNOSIS — M25512 Pain in left shoulder: Secondary | ICD-10-CM | POA: Diagnosis not present

## 2023-06-23 DIAGNOSIS — Z170 Estrogen receptor positive status [ER+]: Secondary | ICD-10-CM | POA: Diagnosis not present

## 2023-06-23 DIAGNOSIS — C50411 Malignant neoplasm of upper-outer quadrant of right female breast: Secondary | ICD-10-CM | POA: Diagnosis not present

## 2023-06-25 DIAGNOSIS — M25512 Pain in left shoulder: Secondary | ICD-10-CM | POA: Diagnosis not present

## 2023-06-29 DIAGNOSIS — Z961 Presence of intraocular lens: Secondary | ICD-10-CM | POA: Diagnosis not present

## 2023-06-30 DIAGNOSIS — L304 Erythema intertrigo: Secondary | ICD-10-CM | POA: Diagnosis not present

## 2023-06-30 DIAGNOSIS — L900 Lichen sclerosus et atrophicus: Secondary | ICD-10-CM | POA: Diagnosis not present

## 2023-07-01 ENCOUNTER — Other Ambulatory Visit: Payer: Self-pay | Admitting: Internal Medicine

## 2023-07-01 DIAGNOSIS — Z853 Personal history of malignant neoplasm of breast: Secondary | ICD-10-CM

## 2023-07-02 DIAGNOSIS — N3289 Other specified disorders of bladder: Secondary | ICD-10-CM | POA: Diagnosis not present

## 2023-07-02 DIAGNOSIS — M6289 Other specified disorders of muscle: Secondary | ICD-10-CM | POA: Diagnosis not present

## 2023-07-02 DIAGNOSIS — N3281 Overactive bladder: Secondary | ICD-10-CM | POA: Diagnosis not present

## 2023-07-16 DIAGNOSIS — G4733 Obstructive sleep apnea (adult) (pediatric): Secondary | ICD-10-CM | POA: Diagnosis not present

## 2023-07-20 DIAGNOSIS — M1991 Primary osteoarthritis, unspecified site: Secondary | ICD-10-CM | POA: Diagnosis not present

## 2023-07-20 DIAGNOSIS — K760 Fatty (change of) liver, not elsewhere classified: Secondary | ICD-10-CM | POA: Diagnosis not present

## 2023-07-20 DIAGNOSIS — Z79899 Other long term (current) drug therapy: Secondary | ICD-10-CM | POA: Diagnosis not present

## 2023-07-31 ENCOUNTER — Other Ambulatory Visit: Payer: Self-pay | Admitting: Internal Medicine

## 2023-07-31 DIAGNOSIS — I739 Peripheral vascular disease, unspecified: Secondary | ICD-10-CM | POA: Diagnosis not present

## 2023-07-31 DIAGNOSIS — E2839 Other primary ovarian failure: Secondary | ICD-10-CM

## 2023-07-31 DIAGNOSIS — I10 Essential (primary) hypertension: Secondary | ICD-10-CM | POA: Diagnosis not present

## 2023-07-31 DIAGNOSIS — M19041 Primary osteoarthritis, right hand: Secondary | ICD-10-CM | POA: Diagnosis not present

## 2023-07-31 DIAGNOSIS — H6690 Otitis media, unspecified, unspecified ear: Secondary | ICD-10-CM | POA: Diagnosis not present

## 2023-07-31 DIAGNOSIS — Z0000 Encounter for general adult medical examination without abnormal findings: Secondary | ICD-10-CM | POA: Diagnosis not present

## 2023-07-31 DIAGNOSIS — C50411 Malignant neoplasm of upper-outer quadrant of right female breast: Secondary | ICD-10-CM | POA: Diagnosis not present

## 2023-07-31 DIAGNOSIS — Z86010 Personal history of colonic polyps: Secondary | ICD-10-CM | POA: Diagnosis not present

## 2023-07-31 DIAGNOSIS — E559 Vitamin D deficiency, unspecified: Secondary | ICD-10-CM | POA: Diagnosis not present

## 2023-07-31 DIAGNOSIS — G43009 Migraine without aura, not intractable, without status migrainosus: Secondary | ICD-10-CM | POA: Diagnosis not present

## 2023-07-31 DIAGNOSIS — J329 Chronic sinusitis, unspecified: Secondary | ICD-10-CM | POA: Diagnosis not present

## 2023-07-31 DIAGNOSIS — I700 Atherosclerosis of aorta: Secondary | ICD-10-CM | POA: Diagnosis not present

## 2023-07-31 DIAGNOSIS — I779 Disorder of arteries and arterioles, unspecified: Secondary | ICD-10-CM | POA: Diagnosis not present

## 2023-08-13 DIAGNOSIS — Z853 Personal history of malignant neoplasm of breast: Secondary | ICD-10-CM | POA: Diagnosis not present

## 2023-08-13 DIAGNOSIS — E349 Endocrine disorder, unspecified: Secondary | ICD-10-CM | POA: Diagnosis not present

## 2023-08-13 DIAGNOSIS — Z23 Encounter for immunization: Secondary | ICD-10-CM | POA: Diagnosis not present

## 2023-08-15 DIAGNOSIS — G4733 Obstructive sleep apnea (adult) (pediatric): Secondary | ICD-10-CM | POA: Diagnosis not present

## 2023-08-20 ENCOUNTER — Encounter: Payer: Self-pay | Admitting: Internal Medicine

## 2023-09-21 ENCOUNTER — Ambulatory Visit
Admission: RE | Admit: 2023-09-21 | Discharge: 2023-09-21 | Disposition: A | Discharge status: Home / Self Care | Payer: 59 | Source: Ambulatory Visit | Attending: Internal Medicine | Admitting: Internal Medicine

## 2023-09-21 DIAGNOSIS — Z853 Personal history of malignant neoplasm of breast: Secondary | ICD-10-CM | POA: Diagnosis not present

## 2023-10-27 ENCOUNTER — Ambulatory Visit: Payer: 59 | Admitting: Cardiology

## 2023-11-18 ENCOUNTER — Other Ambulatory Visit: Payer: Self-pay | Admitting: Hematology and Oncology

## 2023-12-04 ENCOUNTER — Other Ambulatory Visit: Payer: Self-pay | Admitting: Hematology and Oncology

## 2023-12-08 ENCOUNTER — Ambulatory Visit: Payer: 59 | Attending: Cardiology | Admitting: Cardiology

## 2023-12-08 VITALS — BP 120/58 | HR 84 | Ht 65.0 in | Wt 200.0 lb

## 2023-12-08 DIAGNOSIS — I700 Atherosclerosis of aorta: Secondary | ICD-10-CM | POA: Diagnosis not present

## 2023-12-08 DIAGNOSIS — I2510 Atherosclerotic heart disease of native coronary artery without angina pectoris: Secondary | ICD-10-CM

## 2023-12-08 DIAGNOSIS — R0789 Other chest pain: Secondary | ICD-10-CM | POA: Diagnosis not present

## 2023-12-08 DIAGNOSIS — I6523 Occlusion and stenosis of bilateral carotid arteries: Secondary | ICD-10-CM

## 2023-12-08 DIAGNOSIS — E782 Mixed hyperlipidemia: Secondary | ICD-10-CM | POA: Diagnosis not present

## 2023-12-08 NOTE — Patient Instructions (Signed)
Medication Instructions:  The current medical regimen is effective;  continue present plan and medications.  *If you need a refill on your cardiac medications before your next appointment, please call your pharmacy*  Testing/Procedures:    Please report to Radiology at the [facility] Main Entrance 30 minutes early for your test.  [address] [place], [place] [address]                         OR   Please report to Radiology at [facility] Main Entrance, medical mall, 30 mins prior to your test.  [address]  [place], [place]  How to Prepare for Your Cardiac PET/CT Stress Test:  Nothing to eat or drink, except water, 3 hours prior to arrival time.  NO caffeine/decaffeinated products, or chocolate 12 hours prior to arrival. (Please note decaffeinated beverages (teas/coffees) still contain caffeine).  If you have caffeine within 12 hours prior, the test will need to be rescheduled.  Medication instructions: Do not take erectile dysfunction medications for 72 hours prior to test (sildenafil, tadalafil) Do not take nitrates (isosorbide mononitrate, Ranexa) the day before or day of test Do not take tamsulosin the day before or morning of test Hold theophylline containing medications for 12 hours. Hold Dipyridamole 48 hours prior to the test.  Diabetic Preparation: If able to eat breakfast prior to 3 hour fasting, you may take all medications, including your insulin. Do not worry if you miss your breakfast dose of insulin - start at your next meal. If you do not eat prior to 3 hour fast-Hold all diabetes (oral and insulin) medications. Patients who wear a continuous glucose monitor MUST remove the device prior to scanning.  You may take your remaining medications with water.  NO perfume, cologne or lotion on chest or abdomen area. FEMALES - Please avoid wearing dresses to this appointment.  Total time is 1 to 2 hours; you may want to bring  reading material for the waiting time.  IF YOU THINK YOU MAY BE PREGNANT, OR ARE NURSING PLEASE INFORM THE TECHNOLOGIST.  In preparation for your appointment, medication and supplies will be purchased.  Appointment availability is limited, so if you need to cancel or reschedule, please call the Radiology Department Scheduler at [phone] 24 hours in advance to avoid a cancellation fee of $100.00  What to Expect When you Arrive:  Once you arrive and check in for your appointment, you will be taken to a preparation room within the Radiology Department.  A technologist or Nurse will obtain your medical history, verify that you are correctly prepped for the exam, and explain the procedure.  Afterwards, an IV will be started in your arm and electrodes will be placed on your skin for EKG monitoring during the stress portion of the exam. Then you will be escorted to the PET/CT scanner.  There, staff will get you positioned on the scanner and obtain a blood pressure and EKG.  During the exam, you will continue to be connected to the EKG and blood pressure machines.  A small, safe amount of a radioactive tracer will be injected in your IV to obtain a series of pictures of your heart along with an injection of a stress agent.    After your Exam:  It is recommended that you eat a meal and drink a caffeinated beverage to counter act any effects of the stress agent.  Drink plenty of fluids for  the remainder of the day and urinate frequently for the first couple of hours after the exam.  Your doctor will inform you of your test results within 7-10 business days.  For more information and frequently asked questions, please visit our website: [email or web address]  For questions about your test or how to prepare for your test, please call: Cardiac Imaging Nurse Navigators Office: [phone]   Follow-Up: At [facility], you and your health needs are our priority.  As part of our  continuing mission to provide you with exceptional heart care, we have created designated Provider Care Teams.  These Care Teams include your primary Cardiologist (physician) and Advanced Practice Providers (APPs -  Physician Assistants and Nurse Practitioners) who all work together to provide you with the care you need, when you need it.  We recommend signing up for the patient portal called "MyChart".  Sign up information is provided on this After Visit Summary.  MyChart is used to connect with patients for Virtual Visits (Telemedicine).  Patients are able to view lab/test results, encounter notes, upcoming appointments, etc.  Non-urgent messages can be sent to your provider as well.   To learn more about what you can do with MyChart, go to [email or web address].    Your next appointment:   Follow will be based on the results of the above testing.

## 2023-12-08 NOTE — Progress Notes (Signed)
Cardiology Office Note:  .   Date:  12/08/2023  ID:  [name], DOB 04/27/1946, MRN [id number] PCP: Lorenda Ishihara, MD  [facility] Providers Cardiologist:  Donato Schultz, MD     History of Present Illness: .   Pamela Hendricks is a 78 y.o. female Discussed with the use of AI scribe  History of Present Illness   The patient is a 78 year old female with coronary artery calcification (LAD personally viewed from CT 11/2022) who presents with intermittent chest pain.  She experiences intermittent sharp chest pains that last from a few minutes to up to fifteen minutes. The pain is described as 'indescribable' and occurs sporadically. During these episodes, she manages the pain by sitting down and drinking water, suspecting dehydration might be a factor. No chest pain at the time of the visit.  She has a history of coronary artery calcification, noted in a CT scan on December 09, 2018, during an emergency room visit. There is also a history of mild carotid artery stenosis bilaterally, for which she was started on atorvastatin.  Her current medications include atorvastatin 10 mg daily for hyperlipidemia, aspirin 81 mg daily, benazepril 40 mg daily, and hydrochlorothiazide 25 mg daily for hypertension. Her blood pressure is usually around 120/58 mmHg.  She is actively engaging in exercise and following a Mediterranean diet, although she notes she is not losing weight despite increased physical activity. She is looking forward to engaging in more outdoor activities like yard work as the weather improves.           Studies Reviewed: Marland Kitchen   EKG Interpretation Date/Time:  Tuesday December 08 2023 10:49:30 EST Ventricular Rate:  84 PR Interval:  164 QRS Duration:  72 QT Interval:  368 QTC Calculation: 434 R Axis:   36  Text Interpretation: Normal sinus rhythm Normal ECG When compared with ECG of 09-Dec-2022 18:47, No significant change since last tracing Confirmed by Donato Schultz ([id number]) on 12/08/2023 10:53:33 AM    Results   LABS LDL: 83 (12/08/2023) HDL: 40 (12/08/2023) HbA1c: 5.7 (12/08/2023) Hb: 13.2 (12/08/2023) Cr: 0.7 (12/08/2023) TSH: 2.8 (12/08/2023)  RADIOLOGY CT scan: Coronary artery calcification LAD, long segment (12/09/2018)  DIAGNOSTIC STUDIES EKG: Normal (12/08/2023)     Risk Assessment/Calculations:            Physical Exam:   VS:  BP (!) 120/58   Pulse 84   Ht 5\' 5"  (1.651 m)   Wt 200 lb (90.7 kg)   PF 96 L/min   BMI 33.28 kg/m    Wt Readings from Last 3 Encounters:  12/08/23 200 lb (90.7 kg)  03/05/23 197 lb 3.2 oz (89.4 kg)  02/25/23 197 lb 3.2 oz (89.4 kg)    GEN: Well nourished, well developed in no acute distress NECK: No JVD; No carotid bruits CARDIAC: RRR, no murmurs, no rubs, no gallops RESPIRATORY:  Clear to auscultation without rales, wheezing or rhonchi  ABDOMEN: Soft, non-tender, non-distended EXTREMITIES:  No edema; No deformity   ASSESSMENT AND PLAN: .    Assessment and Plan    Intermittent Chest Pain Intermittent sharp chest pain lasting a few minutes to 15 minutes. EKG is normal. Differential diagnosis includes coronary artery disease given coronary artery calcification and cardotid artery stenosis. Discussed stress PET scan to evaluate myocardial blood flow. Explained stress PET scan involves IV medication and requires prior insurance authorization. Patient agreed to proceed with stress PET scan instead of heart catheterization. - Order cardiac stress PET  scan at [facility] - Continue aspirin 81 mg daily - Continue atorvastatin 10 mg daily  Coronary Artery Calcification Coronary artery calcification LAD long segment noted on CT scan from 12/09/2018.  No acute changes on current EKG. - Continue aspirin 81 mg daily - Continue atorvastatin 10 mg daily  Hyperlipidemia Hyperlipidemia managed with atorvastatin. Current LDL is 83 mg/dL, HDL is 40 mg/dL. - Continue atorvastatin 10 mg  daily  Hypertension Well-controlled hypertension with current regimen. Blood pressure today is 120/58 mmHg. - Continue benazepril 40 mg daily - Continue hydrochlorothiazide 25 mg daily  General Health Maintenance Encouraged to maintain Mediterranean diet and exercise 30 minutes daily. Discussed benefits of physical activity and diet on overall health. - Encourage Mediterranean diet - Encourage 30 minutes of exercise daily  Follow-up - Schedule stress PET scan at [facility] - Follow-up based on stress PET scan results.               Signed, Donato Schultz, MD

## 2023-12-10 DIAGNOSIS — R748 Abnormal levels of other serum enzymes: Secondary | ICD-10-CM | POA: Diagnosis not present

## 2023-12-13 DIAGNOSIS — G4733 Obstructive sleep apnea (adult) (pediatric): Secondary | ICD-10-CM | POA: Diagnosis not present

## 2023-12-28 DIAGNOSIS — J309 Allergic rhinitis, unspecified: Secondary | ICD-10-CM | POA: Diagnosis not present

## 2023-12-28 DIAGNOSIS — J329 Chronic sinusitis, unspecified: Secondary | ICD-10-CM | POA: Diagnosis not present

## 2023-12-28 DIAGNOSIS — R109 Unspecified abdominal pain: Secondary | ICD-10-CM | POA: Diagnosis not present

## 2024-01-05 DIAGNOSIS — L900 Lichen sclerosus et atrophicus: Secondary | ICD-10-CM | POA: Diagnosis not present

## 2024-01-05 DIAGNOSIS — L821 Other seborrheic keratosis: Secondary | ICD-10-CM | POA: Diagnosis not present

## 2024-01-05 DIAGNOSIS — L304 Erythema intertrigo: Secondary | ICD-10-CM | POA: Diagnosis not present

## 2024-01-07 DIAGNOSIS — L900 Lichen sclerosus et atrophicus: Secondary | ICD-10-CM | POA: Diagnosis not present

## 2024-01-07 DIAGNOSIS — M6289 Other specified disorders of muscle: Secondary | ICD-10-CM | POA: Diagnosis not present

## 2024-01-07 DIAGNOSIS — K5901 Slow transit constipation: Secondary | ICD-10-CM | POA: Diagnosis not present

## 2024-01-07 DIAGNOSIS — N3281 Overactive bladder: Secondary | ICD-10-CM | POA: Diagnosis not present

## 2024-01-10 DIAGNOSIS — G4733 Obstructive sleep apnea (adult) (pediatric): Secondary | ICD-10-CM | POA: Diagnosis not present

## 2024-01-13 NOTE — Progress Notes (Signed)
 PATIENT: Pamela Hendricks DOB: July 25, 1946  REASON FOR VISIT: follow up HISTORY FROM: patient  Chief Complaint  Patient presents with   Follow-up    Pt in 1 alone Pt here for cpap f/u Pt states dry mouth in am Pt wants to discuss new cpap machine set up date 11/04/2017      HISTORY OF PRESENT ILLNESS:  Pamela Hendricks is a 78 year old female, seen in request by her primary care physician Lorenda Ishihara for evaluation  of head pressure, frequent transient sharp pain, initial evaluation was February 13, 2020.   She has past medical history of hypertension, hyperlipidemia, I saw her initially in 2016 for similar complaints,   She had long history of "sinus headache", with frequent sinus symptoms, runny nose, watering eyes, for many years she self treated with Claritin, around 2014, she had worsening of her headaches, presented to the emergency room few times around the period of time, I personally reviewed MRI of brain in May 2014, no acute abnormality, mild supratentorium small vessel disease, there is also evidence of acute left sphenoid sinus disease, chronic opacification of the right sphenoid sinus   She was started on Inderal 50 mg daily, which has helped her headache moderately, she also complains of early morning headache, was later referred to sleep study, confirmed obstructive sleep apnea, was prescribed CPAP machine, later nortriptyline 50 mg every night was also added on, her headache was apparently under good control for a while, she lost to follow-up.   She had bilateral revision endoscopic sinus surgery bilateral total ethmoidectomy, bilateral maxillary antrostomy, with removal of diseased tissue, lateral nasal frontal recess exploration by ENT  Dr. Annalee Genta in November 2020   Despite the sinus surgery, she continue complains of returning of her headaches, she complains of constant pressure in her head," my head is as big as this room", in addition she complains of  transient sharp piercing pain involving different spot in her skull, lasting for few seconds, she denies visual change, hearing loss, no jaw claudication,   She continue complains of frequent sinus symptoms, nasal discharge, she does nasal wash few times each day, with dried discharge.  She was not able to tolerate her CPAP machine since her surgery, taking Tylenol at least 4 tablets on a daily basis   She also complains of sudden onset right lateral thigh area paresthesia above knee since February 2020, she had a history of bilateral knee replacement, mild low back pain,    Update May 03, 2020 SS: Laboratory evaluation revealed elevated ESR, normal CRP.  MRI of the brain showed chronic bilateral frontal, maxillary sinusitis, no acute intracranial abnormalities.  Remains on nortriptyline 50 mg at bedtime, Inderal LA 60 mg daily (from PCP).  Reports 1-2 headaches a week, will take Tylenol with good benefit.  No longer taking daily Tylenol.  With nortriptyline, no longer having bad headache to the back of her head.  Is yet to be back on CPAP, following sinus surgery.  She does note to be sleeping better with nortriptyline.  May have slight dry mouth as side effect.  Overall, doing well, is widowed for 16 years, remains quite active, doesn't let anything get her down, very active in church.  Presents today unaccompanied.  Update 06/19/2020 ALL: Pamela Hendricks is a 78 y.o. female here today for follow up for OSA on CPAP therapy and headaches. She continues Inderal and nortriptyline. She reports headaches are well managed. She rarely has a headache. She can  abort headache with Tylenol.   She recently restarted CPAP therapy. She has been compliant with use over the past 24 days. She has not noted significant changes in how she feels but contributes this to feeling well, overall. She has all needed supplies. She denies concerns with CPAP machine.   Compliance report dated 05/19/2020 through 06/17/2020 reveals  that she used CPAP twenty-four of the past 30 days for compliance of 80%.  She used CPAP greater than 4 hours twenty-four of the past 30 days for compliance of 80%.  Average usage was 6 hours and 29 minutes.  Residual AHI was slightly elevated at 6.1 on 5 to 18 cm of water and an EPR of two.  There was no significant leak noted.  UPDATE 10/17/2021 ALL: Pamela Hendricks returns for follow up for OSA on CPAP. She was seen by Dr Vickey Huger in 10/2020 for consideration of [facility] and advised to continue CPAP. She had follow up with Dr Terrace Arabia 07/2021 and reported rare headaches and compliance with CPAP. She was advised to wean off Inderal and continue nortriptyline. She reports doing very well. She has had a couple of headaches that were aborted with Tylenol. She reports having eye surgery that has interrupted CPAP usage. She hasn't had any specific issues with machine or supplies. It does make s whistling sound sometimes.     UPDATE 07/08/2022 ALL: Pamela Hendricks returns for follow up for OSA on CPAP and headaches. She was last seen 10/2021 and having an elevated leak. She reports switching out her mask but it has not helped. She continues nortriptyline 50mg  at bedtime. Headaches are well managed. She rarely has a headache.     UPDATE 01/15/2023 ALL: Pamela Hendricks returns for follow up for OSA on CPAP and headaches. She continues nortriptyline 50mg  QHS. She reports headaches have been a little more frequent over the past couple of week.s Her BP has been elevated since hemorrhoidectomy surgery 09/2022. She also underwent right breast lumpectomy 11/2022. She has not had to take any abortive medicaitons. She rarely takes Tylenol. She does have seasonal allergies and chronic sinusitis. She continues antihistamines and nasal steroids.   AHI and leak were elevated at last visit. She was encouraged to sleep on her side and continue working with DME for correct mask fit. She did not reach out to DME for mask refitting. She is using a full face  mask. She reports machine whistles. She is using a foam barrier to help with skin irritation from mask. She does not feel she can get headgear any tighter.       UPDATE 01/18/2024 ALL:  Pamela Hendricks returns for follow up for OSA on CPAP and migraines. She was last seen 01/2023 and advised she could wean nortriptyline to 25mg  at bedtime. She reports stopping nortriptyline 06/2023. She reports headaches are well managed. She may have a few mild headaches at night but the are mild and abort spontaneously. She rarely needs to take Tylenol for abortive therapy. She continues to do well with CPAP therapy. She is using her machine most every night. She does have dry mouth. She has not adjusted humidity. She thinks her machine was set up in 2018.      REVIEW OF SYSTEMS: Out of a complete 14 system review of symptoms, the patient complains only of the following symptoms, headache and fatigue and all other reviewed systems are negative.  ESS: 6/24  ALLERGIES: Allergies  Allergen Reactions   Other Itching and Other (See Comments)    Watery eyes ,  itching - Grass, Rag Weed, Mold, Smoke   Grass Pollen(K-O-R-T-Swt Vern) Other (See Comments)   Molds & Smuts Cough and Other (See Comments)   Pollen Extract Other (See Comments)    HOME MEDICATIONS: Outpatient Medications Prior to Visit  Medication Sig Dispense Refill   acetaminophen (TYLENOL) 500 MG tablet Take 1,000 mg by mouth every 6 (six) hours as needed for mild pain or headache.     anastrozole (ARIMIDEX) 1 MG tablet TAKE 1 TABLET(1 MG) BY MOUTH DAILY 90 tablet 0   aspirin EC 81 MG tablet Take 81 mg by mouth daily.     atorvastatin (LIPITOR) 10 MG tablet Take 10 mg by mouth at bedtime.     benazepril (LOTENSIN) 40 MG tablet Take 40 mg by mouth daily.     Calcium Carb-Cholecalciferol (CALCIUM 600+D3 PO) Take 1 tablet by mouth 2 (two) times daily.     cetirizine (ZYRTEC) 10 MG chewable tablet Chew 10 mg by mouth daily.     estradiol (ESTRACE) 0.1 MG/GM  vaginal cream Place 0.5 Applicatorfuls vaginally at bedtime.     fluticasone (FLONASE) 50 MCG/ACT nasal spray Place 1 spray into both nostrils daily.     hydrochlorothiazide (MICROZIDE) 12.5 MG capsule Take 12.5 mg by mouth daily.     LINZESS 145 MCG CAPS capsule Take 145 mcg by mouth daily as needed (constipation).     mirabegron ER (MYRBETRIQ) 50 MG TB24 tablet Take 50 mg by mouth daily.     Multiple Vitamins-Minerals (MULTIVITAMIN WITH MINERALS) tablet Take 1 tablet by mouth daily.     NON FORMULARY Pt uses a cpap nightly     nystatin (MYCOSTATIN/NYSTOP) powder Apply to under the breast and abdominal areas daily after shower     pantoprazole (PROTONIX) 40 MG tablet Take 40 mg by mouth daily.      Polyethyl Glycol-Propyl Glycol (SYSTANE) 0.4-0.3 % SOLN Place 1 drop into both eyes in the morning.     silver sulfADIAZINE (SILVADENE) 1 % cream 1 application Externally Once a day     triamcinolone ointment (KENALOG) 0.1 % Apply topically.     nortriptyline (PAMELOR) 25 MG capsule TAKE 2 CAPSULES(50 MG) BY MOUTH AT BEDTIME 180 capsule 4   clobetasol ointment (TEMOVATE) 0.05 % Apply 1 application topically 2 (two) times daily.     fluocinonide cream (LIDEX) 0.05 % Apply 1 Application topically daily.     loratadine (CLARITIN) 10 MG tablet Take 10 mg by mouth daily.     oxyCODONE (ROXICODONE) 5 MG immediate release tablet Take 1 tablet (5 mg total) by mouth every 4 (four) hours as needed for severe pain. 30 tablet 0   tiZANidine (ZANAFLEX) 4 MG tablet Take 0.5 tablets (2 mg total) by mouth daily. 30 tablet 0   No facility-administered medications prior to visit.    PAST MEDICAL HISTORY: Past Medical History:  Diagnosis Date   Anemia    Arthritis    Breast cancer (HCC)    Carotid artery disease (HCC)    a. duplex 07/2017 - 1-39% stenosis bilaterally.   Chronic frontal sinusitis    Elevated liver enzymes    per pt   Environmental and seasonal allergies    GERD (gastroesophageal reflux  disease)    Hyperlipidemia    Hypertension    Migraines    sinus headaches, no longer having migraines   OSA (obstructive sleep apnea)    CPAP   Peripheral neuropathy    Peripheral vascular disease (HCC)    carotid blockage -  is followed by Dr. Anne Fu   Pre-diabetes    Skin cancer    Leg   Snores    Wears glasses     PAST SURGICAL HISTORY: Past Surgical History:  Procedure Laterality Date   ABDOMINAL HYSTERECTOMY  1970   Total HYST   BREAST BIOPSY Bilateral 2001   benign   BREAST BIOPSY Right 10/13/2022   MM RT BREAST BX W LOC DEV 1ST LESION IMAGE BX SPEC STEREO GUIDE 10/13/2022 [facility]   BREAST BIOPSY Right 10/13/2022   MM RT BREAST BX W LOC DEV EA AD LESION IMG BX SPEC STEREO GUIDE 10/13/2022 [facility]   BREAST BIOPSY  11/19/2022   MM RT RADIOACTIVE SEED LOC MAMMO GUIDE 11/19/2022 [facility]   BREAST EXCISIONAL BIOPSY Right 2013   sclerosing lesion   BREAST LUMPECTOMY WITH RADIOACTIVE SEED AND SENTINEL LYMPH NODE BIOPSY Right 11/20/2022   Procedure: RIGHT BREAST LUMPECTOMY WITH RADIOACTIVE SEED AND SENTINEL LYMPH NODE BIOPSY;  Surgeon: Griselda Miner, MD;  Location:  [facility];  Service: General;  Laterality: Right;   BREAST LUMPECTOMY WITH RADIOACTIVE SEED LOCALIZATION Left 08/31/2018   Procedure: BREAST LUMPECTOMY WITH RADIOACTIVE SEED LOCALIZATION;  Surgeon: Claud Kelp, MD;  Location: [facility];  Service: General;  Laterality: Left;   COLONOSCOPY     EVALUATION UNDER ANESTHESIA WITH HEMORRHOIDECTOMY N/A 09/18/2022   Procedure: ANORECTAL EXAM UNDER ANESTHESIA WITH HEMORRHOIDECTOMY WITH LIGATION AND HEMORRHOIDOPEXY X3;  Surgeon: Karie Soda, MD;  Location: [facility];  Service: General;  Laterality: N/A;  GEN AND LOCAL   JOINT REPLACEMENT Left 2001   Left Knee Replacement   KNEE ARTHROSCOPY Left    lt   NASAL SINUS SURGERY     NASAL SINUS SURGERY Bilateral 09/30/2019   Procedure: ENDOSCOPIC SINUS SURGERY;  Surgeon: Osborn Coho,  MD;  Location: [facility];  Service: ENT;  Laterality: Bilateral;   REVERSE SHOULDER ARTHROPLASTY Left 03/05/2023   Procedure: REVERSE SHOULDER ARTHROPLASTY;  Surgeon: Jones Broom, MD;  Location: [facility];  Service: Orthopedics;  Laterality: Left;   TOE SURGERY     left foot digit # 2   TOTAL KNEE ARTHROPLASTY Right 10/12/2018   Procedure: TOTAL KNEE ARTHROPLASTY;  Surgeon: Marcene Corning, MD;  Location: [facility];  Service: Orthopedics;  Laterality: Right;   WISDOM TOOTH EXTRACTION      FAMILY HISTORY: Family History  Problem Relation Age of Onset   Cancer Mother    Hypertension Mother    Hypertension Father    Heart disease Father        MI around 4, died at 75   Cancer Sister        Brain Ca   Cancer Brother        Prostate Ca   CAD Daughter        Had bypass in her 18s (type 1 diabetic), died at 54   Sleep apnea Neg Hx     SOCIAL HISTORY: Social History   Socioeconomic History   Marital status: Widowed    Spouse name: Chanetta Marshall   Number of children: 2   Years of education: 11   Highest education level: Not on file  Occupational History   Occupation: retired    Comment: works partime with vendors  Tobacco Use   Smoking status: Never   Smokeless tobacco: Never  Vaping Use   Vaping status: Never Used  Substance and Sexual Activity   Alcohol use: No   Drug use: No   Sexual activity: Not Currently  Other  Topics Concern   Not on file  Social History Narrative   Patient is widowed. Patient is retired . 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 Patient has a 11th grade education. She has two children.   Social Drivers of Corporate investment banker Strain: Not on file  Food Insecurity: Not on file  Transportation Needs: Not on file  Physical Activity: Not on file  Stress: Not on file  Social Connections: Not on file  Intimate Partner Violence: Not on file      PHYSICAL EXAM  Vitals:   01/18/24 1020  BP: 112/63  Pulse: 69  Weight: 200 lb (90.7 kg)   Height: 5\' 5"  (1.651 m)   Mallampati 3 Neck cir: 15"  Body mass index is 33.28 kg/m.  Generalized: Well developed, in no acute distress  Cardiology: normal rate and rhythm, no murmur noted Respiratory: clear to auscultation bilaterally  Neurological examination  Mentation: Alert oriented to time, place, history taking. Follows all commands speech and language fluent Cranial nerve II-XII: Pupils were equal round reactive to light. Extraocular movements were full, visual field were full  Motor: The motor testing reveals 5 over 5 strength of all 4 extremities. Good symmetric motor tone is noted throughout.  Gait and station: Gait is normal.    DIAGNOSTIC DATA (LABS, IMAGING, TESTING) - I reviewed patient records, labs, notes, testing and imaging myself where available.      No data to display           Lab Results  Component Value Date   WBC 8.2 02/25/2023   HGB 11.6 (L) 02/25/2023   HCT 37.3 02/25/2023   MCV 95.6 02/25/2023   PLT 316 02/25/2023      Component Value Date/Time   NA 137 02/25/2023 1126   NA 139 11/22/2014 1050   K 4.2 02/25/2023 1126   CL 102 02/25/2023 1126   CO2 25 02/25/2023 1126   GLUCOSE 100 (H) 02/25/2023 1126   BUN 21 02/25/2023 1126   BUN 11 11/22/2014 1050   CREATININE 0.75 02/25/2023 1126   CREATININE 0.67 10/22/2022 1200   CALCIUM 9.2 02/25/2023 1126   PROT 7.2 02/25/2023 1126   PROT 6.1 12/14/2017 1145   ALBUMIN 3.8 02/25/2023 1126   ALBUMIN 4.0 12/14/2017 1145   AST 19 02/25/2023 1126   AST 26 10/22/2022 1200   ALT 21 02/25/2023 1126   ALT 29 10/22/2022 1200   ALKPHOS 55 02/25/2023 1126   BILITOT 0.4 02/25/2023 1126   BILITOT 0.5 10/22/2022 1200   GFRNONAA >60 02/25/2023 1126   GFRNONAA >60 10/22/2022 1200   GFRAA >60 09/27/2019 0920   Lab Results  Component Value Date   CHOL 125 12/17/2021   HDL 51 12/17/2021   LDLCALC 58 12/17/2021   TRIG 80 12/17/2021   CHOLHDL 2.5 12/17/2021   Lab Results  Component Value Date    HGBA1C 5.7 (H) 02/25/2023   No results found for: "VITAMINB12" Lab Results  Component Value Date   TSH 2.560 11/22/2014     ASSESSMENT AND PLAN 78 y.o. year old female  has a past medical history of Anemia, Arthritis, Breast cancer (HCC), Carotid artery disease (HCC), Chronic frontal sinusitis, Elevated liver enzymes, Environmental and seasonal allergies, GERD (gastroesophageal reflux disease), Hyperlipidemia, Hypertension, Migraines, OSA (obstructive sleep apnea), Peripheral neuropathy, Peripheral vascular disease (HCC), Pre-diabetes, Skin cancer, Snores, and Wears glasses. here with     ICD-10-CM   1. OSA (obstructive sleep apnea)  G47.33 For home use  only DME continuous positive airway pressure (CPAP)    Home sleep test    2. Migraine without status migrainosus, not intractable, unspecified migraine type  G43.909       Thelma Barge is doing very well.  She feels that headaches are well-managed. She has discontinued nortriptyline. She rarely takes Tylenol for abortive therapy. Compliance report reveals excellent compliance. AHI is well managed and leak improved. I have asked that she continue using CPAP nightly and for greater than 4 hours each night. We have ordered HST for eval of sleep apnea and will anticipate placing orders for new machine pending results. She was encouraged to continue healthy lifestyle habits.  Adequate hydration encouraged.  She will follow-up in 31-90 days following set up of new machine.   Set up date 11/06/2017   Orders Placed This Encounter  Procedures   For home use only DME continuous positive airway pressure (CPAP)    Heated Humidity with all supplies as needed    Length of Need:   Lifetime    Patient has OSA or probable OSA:   Yes    Is the patient currently using CPAP in the home:   Yes    Settings:   Other see comments    CPAP supplies needed:   Mask, headgear, cushions, filters, heated tubing and water chamber   Home sleep test    Standing Status:    Future    Expiration Date:   01/17/2025    Where should this test be performed::   [facility]      No orders of the defined types were placed in this encounter.     I spent 30 minutes of face-to-face and non-face-to-face time with patient.  This included previsit chart review, lab review, study review, order entry, electronic health record documentation, patient education.   Shawnie Dapper, FNP-C 01/18/2024, 10:58 AM [facility] [address], [address] [place], [place] [address] [phone]

## 2024-01-13 NOTE — Patient Instructions (Addendum)
 Below is our plan:  We will continue to monitor headaches.   Please continue using your CPAP regularly. While your insurance requires that you use CPAP at least 4 hours each night on 70% of the nights, I recommend, that you not skip any nights and use it throughout the night if you can. Getting used to CPAP and staying with the treatment long term does take time and patience and discipline. Untreated obstructive sleep apnea when it is moderate to severe can have an adverse impact on cardiovascular health and raise her risk for heart disease, arrhythmias, hypertension, congestive heart failure, stroke and diabetes. Untreated obstructive sleep apnea causes sleep disruption, nonrestorative sleep, and sleep deprivation. This can have an impact on your day to day functioning and cause daytime sleepiness and impairment of cognitive function, memory loss, mood disturbance, and problems focussing. Using CPAP regularly can improve these symptoms.  We will update supply orders, today. You are eligible for a new machine. I will order a repeat sleep study that you will do at home. Please listen out for a call from our sleep lab staff to schedule. Once you complete study, our sleep doctors with evaluate the data and we will use that to order a new machine as indicated. This process could take a few weeks. Once new machine is ordered, you will hear back from your DME company to schedule set up of your new machine.   We will need to see you back within 31-90 days following set up of your new CPAP to document compliance as required by your insurance company. Please call the office to schedule follow up when you receive your new machine. Please feel free to reach out with any questions or concerns.    Please make sure you are staying well hydrated. I recommend 50-60 ounces daily. Well balanced diet and regular exercise encouraged. Consistent sleep schedule with 6-8 hours recommended.   Please continue follow up with care  team as directed.   Follow up with me in 1 year   GENERAL HEADACHE INFORMATION:   Natural supplements: Magnesium Oxide or Magnesium Glycinate 500 mg at bed (up to 800 mg daily) Coenzyme Q10 300 mg in AM Vitamin B2- 200 mg twice a day   Add 1 supplement at a time since even natural supplements can have undesirable side effects. You can sometimes buy supplements cheaper (especially Coenzyme Q10) at www.WebmailGuide.co.za or at [facility].  Migraine with aura: There is increased risk for stroke in women with migraine with aura and a contraindication for the combined contraceptive pill for use by women who have migraine with aura. The risk for women with migraine without aura is lower. However other risk factors like smoking are far more likely to increase stroke risk than migraine. There is a recommendation for no smoking and for the use of OCPs without estrogen such as progestogen only pills particularly for women with migraine with aura.Marland Kitchen People who have migraine headaches with auras may be 3 times more likely to have a stroke caused by a blood clot, compared to migraine patients who don't see auras. Women who take hormone-replacement therapy may be 30 percent more likely to suffer a clot-based stroke than women not taking medication containing estrogen. Other risk factors like smoking and high blood pressure may be  much more important.    Vitamins and herbs that show potential:   Magnesium: Magnesium (250 mg twice a day or 500 mg at bed) has a relaxant effect on smooth muscles such as  blood vessels. Individuals suffering from frequent or daily headache usually have low magnesium levels which can be increase with daily supplementation of 400-750 mg. Three trials found 40-90% average headache reduction  when used as a preventative. Magnesium may help with headaches are aura, the best evidence for magnesium is for migraine with aura is its thought to stop the cortical spreading depression we believe is the  pathophysiology of migraine aura.Magnesium also demonstrated the benefit in menstrually related migraine.  Magnesium is part of the messenger system in the serotonin cascade and it is a good muscle relaxant.  It is also useful for constipation which can be a side effect of other medications used to treat migraine. Good sources include nuts, whole grains, and tomatoes. Side Effects: loose stool/diarrhea  Riboflavin (vitamin B 2) 200 mg twice a day. This vitamin assists nerve cells in the production of ATP a principal energy storing molecule.  It is necessary for many chemical reactions in the body.  There have been at least 3 clinical trials of riboflavin using 400 mg per day all of which suggested that migraine frequency can be decreased.  All 3 trials showed significant improvement in over half of migraine sufferers.  The supplement is found in bread, cereal, milk, meat, and poultry.  Most Americans get more riboflavin than the recommended daily allowance, however riboflavin deficiency is not necessary for the supplements to help prevent headache. Side effects: energizing, green urine   Coenzyme Q10: This is present in almost all cells in the body and is critical component for the conversion of energy.  Recent studies have shown that a nutritional supplement of CoQ10 can reduce the frequency of migraine attacks by improving the energy production of cells as with riboflavin.  Doses of 150 mg twice a day have been shown to be effective.   Melatonin: Increasing evidence shows correlation between melatonin secretion and headache conditions.  Melatonin supplementation has decreased headache intensity and duration.  It is widely used as a sleep aid.  Sleep is natures way of dealing with migraine.  A dose of 3 mg is recommended to start for headaches including cluster headache. Higher doses up to 15 mg has been reviewed for use in Cluster headache and have been used. The rationale behind using melatonin for cluster  is that many theories regarding the cause of Cluster headache center around the disruption of the normal circadian rhythm in the brain.  This helps restore the normal circadian rhythm.   HEADACHE DIET: Foods and beverages which may trigger migraine Note that only 20% of headache patients are food sensitive. You will know if you are food sensitive if you get a headache consistently 20 minutes to 2 hours after eating a certain food. Only cut out a food if it causes headaches, otherwise you might remove foods you enjoy! What matters most for diet is to eat a well balanced healthy diet full of vegetables and low fat protein, and to not miss meals.   Chocolate, other sweets ALL cheeses except cottage and cream cheese Dairy products, yogurt, sour cream, ice cream Liver Meat extracts (Bovril, Marmite, meat tenderizers) Meats or fish which have undergone aging, fermenting, pickling or smoking. These include: Hotdogs,salami,Lox,sausage, mortadellas,smoked salmon, pepperoni, Pickled herring Pods of broad bean (English beans, Chinese pea pods, [place] (fava) beans, lima and navy beans Ripe avocado, ripe banana Yeast extracts or active yeast preparations such as Brewer's or Fleishman's (commercial bakes goods are permitted) Tomato based foods, pizza (lasagna, etc.)   MSG (monosodium  glutamate) is disguised as many things; look for these common aliases: Monopotassium glutamate Autolysed yeast Hydrolysed protein Sodium caseinate "flavorings" "all natural preservatives" Nutrasweet   Avoid all other foods that convincingly provoke headaches.   Resources: The Dizzy [place] Laundry Your Headache Diet, migrainestrong.com  https://zamora-andrews.com/   Caffeine and Migraine For patients that have migraine, caffeine intake more than 3 days per week can lead to dependency and increased migraine frequency. I would recommend cutting back on your caffeine intake as  best you can. The recommended amount of caffeine is 200-300 mg daily, although migraine patients may experience dependency at even lower doses. While you may notice an increase in headache temporarily, cutting back will be helpful for headaches in the long run. For more information on caffeine and migraine, visit: https://americanmigrainefoundation.org/resource-library/caffeine-and-migraine/   Headache Prevention Strategies:   1. Maintain a headache diary; learn to identify and avoid triggers.  - This can be a simple note where you log when you had a headache, associated symptoms, and medications used - There are several smartphone apps developed to help track migraines: Migraine Buddy, Migraine Monitor, Curelator N1-Headache App   Common triggers include: Emotional triggers: Emotional/Upset family or friends Emotional/Upset occupation Business reversal/success Anticipation anxiety Crisis-serious Post-crisis periodNew job/position   Physical triggers: Vacation Day Weekend Strenuous Exercise High Altitude Location New Move Menstrual Day Physical Illness Oversleep/Not enough sleep Weather changes Light: Photophobia or light sesnitivity treatment involves a balance between desensitization and reduction in overly strong input. Use dark polarized glasses outside, but not inside. Avoid bright or fluorescent light, but do not dim environment to the point that going into a normally lit room hurts. Consider FL-41 tint lenses, which reduce the most irritating wavelengths without blocking too much light.  These can be obtained at axonoptics.com or theraspecs.com Foods: see list above.   2. Limit use of acute treatments (over-the-counter medications, triptans, etc.) to no more than 2 days per week or 10 days per month to prevent medication overuse headache (rebound headache).     3. Follow a regular schedule (including weekends and holidays): Don't skip meals. Eat a balanced diet. 8 hours of  sleep nightly. Minimize stress. Exercise 30 minutes per day. Being overweight is associated with a 5 times increased risk of chronic migraine. Keep well hydrated and drink 6-8 glasses of water per day.   4. Initiate non-pharmacologic measures at the earliest onset of your headache. Rest and quiet environment. Relax and reduce stress. Breathe2Relax is a free app that can instruct you on    some simple relaxtion and breathing techniques. Http://Dawnbuse.com is a    free website that provides teaching videos on relaxation.  Also, there are  many apps that   can be downloaded for "mindful" relaxation.  An app called YOGA NIDRA will help walk you through mindfulness. Another app called Calm can be downloaded to give you a structured mindfulness guide with daily reminders and skill development. Headspace for guided meditation Mindfulness Based Stress Reduction Online Course: www.palousemindfulness.com Cold compresses.   5. Don't wait!! Take the maximum allowable dosage of prescribed medication at the first sign of migraine.   6. Compliance:  Take prescribed medication regularly as directed and at the first sign of a migraine.   7. Communicate:  Call your physician when problems arise, especially if your headaches change, increase in frequency/severity, or become associated with neurological symptoms (weakness, numbness, slurred speech, etc.). Proceed to emergency room if you experience new or worsening symptoms or symptoms do not resolve, if you  have new neurologic symptoms or if headache is severe, or for any concerning symptom.   8. Headache/pain management therapies: Consider various complementary methods, including medication, behavioral therapy, psychological counselling, biofeedback, massage therapy, acupuncture, dry needling, and other modalities.  Such measures may reduce the need for medications. Counseling for pain management, where patients learn to function and ignore/minimize their pain,  seems to work very well.   9. Recommend changing family's attention and focus away from patient's headaches. Instead, emphasize daily activities. If first question of day is 'How are your headaches/Do you have a headache today?', then patient will constantly think about headaches, thus making them worse. Goal is to re-direct attention away from headaches, toward daily activities and other distractions.   10. Helpful Websites: www.AmericanHeadacheSociety.org PatentHood.ch www.headaches.org TightMarket.nl www.achenet.org   You may receive a survey regarding today's visit. I encourage you to leave honest feed back as I do use this information to improve patient care. Thank you for seeing me today!

## 2024-01-18 ENCOUNTER — Ambulatory Visit (INDEPENDENT_AMBULATORY_CARE_PROVIDER_SITE_OTHER): Payer: 59 | Admitting: Family Medicine

## 2024-01-18 ENCOUNTER — Encounter: Payer: Self-pay | Admitting: Family Medicine

## 2024-01-18 VITALS — BP 112/63 | HR 69 | Ht 65.0 in | Wt 200.0 lb

## 2024-01-18 DIAGNOSIS — G43909 Migraine, unspecified, not intractable, without status migrainosus: Secondary | ICD-10-CM | POA: Diagnosis not present

## 2024-01-18 DIAGNOSIS — G4733 Obstructive sleep apnea (adult) (pediatric): Secondary | ICD-10-CM | POA: Diagnosis not present

## 2024-01-21 ENCOUNTER — Telehealth: Payer: Self-pay | Admitting: Family Medicine

## 2024-01-21 NOTE — Telephone Encounter (Signed)
 pt states she will call back once she check with her insurance the cost   HST [employer] medicare no auth req EE

## 2024-01-25 NOTE — Telephone Encounter (Signed)
 Patient left a voicemail on my phone stating at this time she is not going to have the SS.

## 2024-01-26 DIAGNOSIS — Z170 Estrogen receptor positive status [ER+]: Secondary | ICD-10-CM | POA: Diagnosis not present

## 2024-01-26 DIAGNOSIS — C50411 Malignant neoplasm of upper-outer quadrant of right female breast: Secondary | ICD-10-CM | POA: Diagnosis not present

## 2024-01-27 DIAGNOSIS — K5901 Slow transit constipation: Secondary | ICD-10-CM | POA: Diagnosis not present

## 2024-01-27 DIAGNOSIS — R6889 Other general symptoms and signs: Secondary | ICD-10-CM | POA: Diagnosis not present

## 2024-01-27 DIAGNOSIS — E785 Hyperlipidemia, unspecified: Secondary | ICD-10-CM | POA: Diagnosis not present

## 2024-01-27 DIAGNOSIS — I10 Essential (primary) hypertension: Secondary | ICD-10-CM | POA: Diagnosis not present

## 2024-01-27 DIAGNOSIS — R1031 Right lower quadrant pain: Secondary | ICD-10-CM | POA: Diagnosis not present

## 2024-01-27 DIAGNOSIS — R3915 Urgency of urination: Secondary | ICD-10-CM | POA: Diagnosis not present

## 2024-02-01 ENCOUNTER — Telehealth (HOSPITAL_COMMUNITY): Payer: Self-pay | Admitting: Emergency Medicine

## 2024-02-01 ENCOUNTER — Encounter (HOSPITAL_COMMUNITY): Payer: Self-pay

## 2024-02-01 NOTE — Telephone Encounter (Signed)
 Reaching out to patient to offer assistance regarding upcoming cardiac imaging study; pt verbalizes understanding of appt date/time, parking situation and where to check in, pre-test NPO status and medications ordered, and verified current allergies; name and call back number provided for further questions should they arise Rockwell Alexandria RN Navigator Cardiac Imaging [facility] [phone] office [phone] cell

## 2024-02-02 ENCOUNTER — Ambulatory Visit (HOSPITAL_COMMUNITY)
Admission: RE | Admit: 2024-02-02 | Discharge: 2024-02-02 | Disposition: A | Discharge status: Home / Self Care | Payer: 59 | Source: Ambulatory Visit | Attending: Cardiology | Admitting: Cardiology

## 2024-02-02 ENCOUNTER — Encounter: Payer: Self-pay | Admitting: Cardiology

## 2024-02-02 DIAGNOSIS — I700 Atherosclerosis of aorta: Secondary | ICD-10-CM | POA: Insufficient documentation

## 2024-02-02 DIAGNOSIS — R0789 Other chest pain: Secondary | ICD-10-CM | POA: Diagnosis not present

## 2024-02-02 LAB — NM PET CT CARDIAC PERFUSION MULTI W/ABSOLUTE BLOODFLOW
MBFR: 1.9
Nuc Rest EF: 68 %
Nuc Stress EF: 70 %
Rest MBF: 0.99 ml/g/min
Rest Nuclear Isotope Dose: 23.5 mCi
ST Depression (mm): 0 mm
Stress MBF: 1.88 ml/g/min
Stress Nuclear Isotope Dose: 23.5 mCi
TID: 0.94

## 2024-02-02 MED ORDER — RUBIDIUM RB82 GENERATOR (RUBYFILL)
23.4600 | PACK | Freq: Once | INTRAVENOUS | Status: AC
  Administered 2024-02-02: 23.46 via INTRAVENOUS

## 2024-02-02 MED ORDER — RUBIDIUM RB82 GENERATOR (RUBYFILL)
23.4500 | PACK | Freq: Once | INTRAVENOUS | Status: AC
  Administered 2024-02-02: 23.45 via INTRAVENOUS

## 2024-02-02 MED ORDER — REGADENOSON 0.4 MG/5ML IV SOLN
0.4000 mg | Freq: Once | INTRAVENOUS | Status: AC
  Administered 2024-02-02: 0.4 mg via INTRAVENOUS

## 2024-02-02 MED ORDER — REGADENOSON 0.4 MG/5ML IV SOLN
INTRAVENOUS | Status: AC
  Filled 2024-02-02: qty 5

## 2024-02-09 ENCOUNTER — Telehealth: Payer: Self-pay | Admitting: Cardiology

## 2024-02-09 NOTE — Telephone Encounter (Signed)
 Spoke with patient and in the middle of giving results phone disconnected.   Tried again busy signal x2

## 2024-02-09 NOTE — Telephone Encounter (Signed)
 Pamela Bathe, MD 02/02/2024  5:08 PM EDT     Stress test shows a very small area of apical inferior infarction which is low risk.  There is no ischemia identified.  Pump function appears normal.  Based upon these findings, lets continue with medical management, Mediterranean diet, exercise.  If symptoms worsen or become more worrisome please let us know.    Reviewed results with pt who states understanding.    Pt reports also she does not use her MyChart account but does not want to deactivated because her daughter in law uses it.

## 2024-02-09 NOTE — Telephone Encounter (Signed)
 Patient returned RN's call regarding results.

## 2024-02-09 NOTE — Telephone Encounter (Signed)
Pt returning call in regards to results. Please advise 

## 2024-02-09 NOTE — Telephone Encounter (Signed)
 Pt returning call to a nurse

## 2024-02-15 ENCOUNTER — Inpatient Hospital Stay: Payer: 59 | Attending: Hematology and Oncology | Admitting: Hematology and Oncology

## 2024-02-15 VITALS — BP 129/61 | HR 87 | Temp 98.0°F | Resp 18 | Ht 65.0 in | Wt 198.8 lb

## 2024-02-15 DIAGNOSIS — Z79811 Long term (current) use of aromatase inhibitors: Secondary | ICD-10-CM | POA: Diagnosis not present

## 2024-02-15 DIAGNOSIS — Z79899 Other long term (current) drug therapy: Secondary | ICD-10-CM | POA: Diagnosis not present

## 2024-02-15 DIAGNOSIS — Z1732 Human epidermal growth factor receptor 2 negative status: Secondary | ICD-10-CM | POA: Diagnosis not present

## 2024-02-15 DIAGNOSIS — C50411 Malignant neoplasm of upper-outer quadrant of right female breast: Secondary | ICD-10-CM | POA: Diagnosis not present

## 2024-02-15 DIAGNOSIS — Z170 Estrogen receptor positive status [ER+]: Secondary | ICD-10-CM | POA: Insufficient documentation

## 2024-02-15 DIAGNOSIS — Z7982 Long term (current) use of aspirin: Secondary | ICD-10-CM | POA: Insufficient documentation

## 2024-02-15 DIAGNOSIS — Z1721 Progesterone receptor positive status: Secondary | ICD-10-CM | POA: Insufficient documentation

## 2024-02-15 MED ORDER — ANASTROZOLE 1 MG PO TABS: 1.0000 mg | ORAL_TABLET | Freq: Every day | ORAL | 3 refills | Status: AC

## 2024-02-15 NOTE — Assessment & Plan Note (Signed)
 11/20/2022:Right lumpectomy: Grade 2 IDC 1.7 cm, margins negative, ER 100%, PR 95%, HER2 negative, Ki-67 10%   Treatment plan: +/- Adjuvant radiation: Patient did not undergo adjuvant radiation Adjuvant antiestrogen therapy with anastrozole started 12/04/2023   Anastrozole toxicities:  Breast cancer surveillance:  Breast exam 02/15/2024: Benign Mammograms 09/21/2023: Benign breast density category B Cardiac PET/CT scan 02/02/2024: Benign

## 2024-02-15 NOTE — Progress Notes (Signed)
 Patient Care Team: Lorenda Ishihara, MD as PCP - General (Internal Medicine) Jake Bathe, MD as PCP - Cardiology (Cardiology) Karie Soda, MD as Consulting Physician (General Surgery) Donnetta Hail, MD as Consulting Physician (Rheumatology) Shawnie Dapper, NP as Nurse Practitioner (Neurology) Marcene Corning, MD as Consulting Physician (Orthopedic Surgery) Donnelly Angelica, RN as Oncology Nurse Navigator Pershing Proud, RN as Oncology Nurse Navigator Serena Croissant, MD as Consulting Physician (Hematology and Oncology) Griselda Miner, MD as Consulting Physician (General Surgery) Lonie Peak, MD as Attending Physician (Radiation Oncology)  DIAGNOSIS:  Encounter Diagnosis  Name Primary?   Malignant neoplasm of upper-outer quadrant of right breast in female, estrogen receptor positive (HCC) Yes    SUMMARY OF ONCOLOGIC HISTORY: Oncology History  Malignant neoplasm of upper-outer quadrant of right breast in female, estrogen receptor positive (HCC)  10/13/2022 Initial Diagnosis   Screening mammogram detected right breast calcifications 1.7 cm span (history of CSL), additional 0.3 cm retroareolar calcifications within biopsy were benign and concordant.  Biopsy: Grade 2 IDC with DCIS ER 100%, PR 95%, HER2 1+ negative, Ki-67 10%   10/22/2022 Cancer Staging   Staging form: Breast, AJCC 8th Edition - Clinical: Stage IA (cT1c, cN0, cM0, G2, ER+, PR+, HER2-) - Signed by Serena Croissant, MD on 10/22/2022 Stage prefix: Initial diagnosis Histologic grading system: 3 grade system   11/20/2022 Surgery   Right lumpectomy: Grade 2 IDC 1.7 cm, margins negative, ER 100%, PR 95%, HER2 negative, Ki-67 10%     CHIEF COMPLIANT: Follow-up on anastrozole therapy  HISTORY OF PRESENT ILLNESS:   History of Present Illness The patient, with a history of breast cancer, has been on anastrozole . She reports excellent tolerance of the medication, with no side effects. The patient feels overall well  and reports no new or concerning symptoms. The patient's most recent mammogram in November showed no abnormalities and the patient's breasts were classified as B, indicating she is not too dense. The patient is due for another mammogram this year.     ALLERGIES:  is allergic to other, grass pollen([id number]-o-r-t-swt vern), molds & smuts, and pollen extract.  MEDICATIONS:  Current Outpatient Medications  Medication Sig Dispense Refill   acetaminophen (TYLENOL) 500 MG tablet Take 1,000 mg by mouth every 6 (six) hours as needed for mild pain or headache.     anastrozole (ARIMIDEX) 1 MG tablet TAKE 1 TABLET(1 MG) BY MOUTH DAILY 90 tablet 0   aspirin EC 81 MG tablet Take 81 mg by mouth daily.     atorvastatin (LIPITOR) 10 MG tablet Take 10 mg by mouth at bedtime.     benazepril (LOTENSIN) 40 MG tablet Take 40 mg by mouth daily.     Calcium Carb-Cholecalciferol (CALCIUM 600+D3 PO) Take 1 tablet by mouth 2 (two) times daily.     cetirizine (ZYRTEC) 10 MG chewable tablet Chew 10 mg by mouth daily.     estradiol (ESTRACE) 0.1 MG/GM vaginal cream Place 0.5 Applicatorfuls vaginally at bedtime.     fluticasone (FLONASE) 50 MCG/ACT nasal spray Place 1 spray into both nostrils daily.     hydrochlorothiazide (MICROZIDE) 12.5 MG capsule Take 12.5 mg by mouth daily.     LINZESS 145 MCG CAPS capsule Take 145 mcg by mouth daily as needed (constipation).     mirabegron ER (MYRBETRIQ) 50 MG TB24 tablet Take 50 mg by mouth daily.     Multiple Vitamins-Minerals (MULTIVITAMIN WITH MINERALS) tablet Take 1 tablet by mouth daily.     NON FORMULARY  Pt uses a cpap nightly     nystatin (MYCOSTATIN/NYSTOP) powder Apply to under the breast and abdominal areas daily after shower     pantoprazole (PROTONIX) 40 MG tablet Take 40 mg by mouth daily.      Polyethyl Glycol-Propyl Glycol (SYSTANE) 0.4-0.3 % SOLN Place 1 drop into both eyes in the morning.     silver sulfADIAZINE (SILVADENE) 1 % cream 1 application Externally Once a day      triamcinolone ointment (KENALOG) 0.1 % Apply topically.     No current facility-administered medications for this visit.    PHYSICAL EXAMINATION: ECOG PERFORMANCE STATUS: 1 - Symptomatic but completely ambulatory  Vitals:   02/15/24 1021  BP: 129/61  Pulse: 87  Resp: 18  Temp: 98 F (36.7 C)  SpO2: 99%   Filed Weights   02/15/24 1021  Weight: 198 lb 12.8 oz (90.2 kg)    Physical Exam No palpable lumps or nodules of concern  (exam performed in the presence of a chaperone)  LABORATORY DATA:  I have reviewed the data as listed    Latest Ref Rng & Units 02/25/2023   11:26 AM 12/09/2022    4:08 PM 12/09/2022   12:59 PM  CMP  Glucose 70 - 99 mg/dL 130   97   BUN 8 - 23 mg/dL 21   12   Creatinine 8.65 - 1.00 mg/dL 7.84   6.96   Sodium 295 - 145 mmol/L 137   137   Potassium 3.5 - 5.1 mmol/L 4.2   3.6   Chloride 98 - 111 mmol/L 102   99   CO2 22 - 32 mmol/L 25   26   Calcium 8.9 - 10.3 mg/dL 9.2   9.5   Total Protein 6.5 - 8.1 g/dL 7.2  7.8    Total Bilirubin 0.3 - 1.2 mg/dL 0.4  0.8    Alkaline Phos 38 - 126 U/L 55  73    AST 15 - 41 U/L 19  36    ALT 0 - 44 U/L 21  36      Lab Results  Component Value Date   WBC 8.2 02/25/2023   HGB 11.6 (L) 02/25/2023   HCT 37.3 02/25/2023   MCV 95.6 02/25/2023   PLT 316 02/25/2023   NEUTROABS 7.9 (H) 10/22/2022    ASSESSMENT & PLAN:  Malignant neoplasm of upper-outer quadrant of right breast in female, estrogen receptor positive (HCC) 11/20/2022:Right lumpectomy: Grade 2 IDC 1.7 cm, margins negative, ER 100%, PR 95%, HER2 negative, Ki-67 10%   Treatment plan: +/- Adjuvant radiation: Patient did not undergo adjuvant radiation Adjuvant antiestrogen therapy with anastrozole started 12/04/2023   Anastrozole toxicities: Tolerating it extremely well without any problems or concerns. Her main concern is lack of energy.  She plans to take B12 supplement and see if it improves her energy levels.  Breast cancer surveillance:   Breast exam 02/15/2024: Benign Mammograms 09/21/2023: Benign breast density category B Cardiac PET/CT scan 02/02/2024: Benign     No orders of the defined types were placed in this encounter.  The patient has a good understanding of the overall plan. she agrees with it. she will call with any problems that may develop before the next visit here. Total time spent: 30 mins including face to face time and time spent for planning, charting and co-ordination of care   Tamsen Meek, MD 02/15/24

## 2024-03-10 DIAGNOSIS — G4733 Obstructive sleep apnea (adult) (pediatric): Secondary | ICD-10-CM | POA: Diagnosis not present

## 2024-06-23 DIAGNOSIS — G4733 Obstructive sleep apnea (adult) (pediatric): Secondary | ICD-10-CM | POA: Diagnosis not present

## 2024-06-28 DIAGNOSIS — Z961 Presence of intraocular lens: Secondary | ICD-10-CM | POA: Diagnosis not present

## 2024-07-04 DIAGNOSIS — R252 Cramp and spasm: Secondary | ICD-10-CM | POA: Diagnosis not present

## 2024-07-04 DIAGNOSIS — R233 Spontaneous ecchymoses: Secondary | ICD-10-CM | POA: Diagnosis not present

## 2024-07-04 DIAGNOSIS — I83899 Varicose veins of unspecified lower extremities with other complications: Secondary | ICD-10-CM | POA: Diagnosis not present

## 2024-07-04 DIAGNOSIS — I10 Essential (primary) hypertension: Secondary | ICD-10-CM | POA: Diagnosis not present

## 2024-07-04 DIAGNOSIS — D509 Iron deficiency anemia, unspecified: Secondary | ICD-10-CM | POA: Diagnosis not present

## 2024-07-04 DIAGNOSIS — M25552 Pain in left hip: Secondary | ICD-10-CM | POA: Diagnosis not present

## 2024-07-04 DIAGNOSIS — M79641 Pain in right hand: Secondary | ICD-10-CM | POA: Diagnosis not present

## 2024-07-18 DIAGNOSIS — M654 Radial styloid tenosynovitis [de Quervain]: Secondary | ICD-10-CM | POA: Diagnosis not present

## 2024-07-18 DIAGNOSIS — M1991 Primary osteoarthritis, unspecified site: Secondary | ICD-10-CM | POA: Diagnosis not present

## 2024-07-18 DIAGNOSIS — K760 Fatty (change of) liver, not elsewhere classified: Secondary | ICD-10-CM | POA: Diagnosis not present

## 2024-07-18 DIAGNOSIS — Z79899 Other long term (current) drug therapy: Secondary | ICD-10-CM | POA: Diagnosis not present

## 2024-08-02 DIAGNOSIS — Z170 Estrogen receptor positive status [ER+]: Secondary | ICD-10-CM | POA: Diagnosis not present

## 2024-08-02 DIAGNOSIS — C50411 Malignant neoplasm of upper-outer quadrant of right female breast: Secondary | ICD-10-CM | POA: Diagnosis not present

## 2024-08-12 DIAGNOSIS — Z136 Encounter for screening for cardiovascular disorders: Secondary | ICD-10-CM | POA: Diagnosis not present

## 2024-08-12 DIAGNOSIS — E785 Hyperlipidemia, unspecified: Secondary | ICD-10-CM | POA: Diagnosis not present

## 2024-08-12 DIAGNOSIS — Z23 Encounter for immunization: Secondary | ICD-10-CM | POA: Diagnosis not present

## 2024-08-12 DIAGNOSIS — R0789 Other chest pain: Secondary | ICD-10-CM | POA: Diagnosis not present

## 2024-08-12 DIAGNOSIS — J329 Chronic sinusitis, unspecified: Secondary | ICD-10-CM | POA: Diagnosis not present

## 2024-08-12 DIAGNOSIS — N3281 Overactive bladder: Secondary | ICD-10-CM | POA: Diagnosis not present

## 2024-08-12 DIAGNOSIS — I10 Essential (primary) hypertension: Secondary | ICD-10-CM | POA: Diagnosis not present

## 2024-08-12 DIAGNOSIS — I83893 Varicose veins of bilateral lower extremities with other complications: Secondary | ICD-10-CM | POA: Diagnosis not present

## 2024-08-12 DIAGNOSIS — Z96651 Presence of right artificial knee joint: Secondary | ICD-10-CM | POA: Diagnosis not present

## 2024-08-12 DIAGNOSIS — G4730 Sleep apnea, unspecified: Secondary | ICD-10-CM | POA: Diagnosis not present

## 2024-08-12 DIAGNOSIS — I700 Atherosclerosis of aorta: Secondary | ICD-10-CM | POA: Diagnosis not present

## 2024-08-12 DIAGNOSIS — E559 Vitamin D deficiency, unspecified: Secondary | ICD-10-CM | POA: Diagnosis not present

## 2024-08-12 DIAGNOSIS — Z0000 Encounter for general adult medical examination without abnormal findings: Secondary | ICD-10-CM | POA: Diagnosis not present

## 2024-08-12 DIAGNOSIS — M1990 Unspecified osteoarthritis, unspecified site: Secondary | ICD-10-CM | POA: Diagnosis not present

## 2024-08-29 DIAGNOSIS — M25532 Pain in left wrist: Secondary | ICD-10-CM | POA: Diagnosis not present

## 2024-08-29 DIAGNOSIS — M25531 Pain in right wrist: Secondary | ICD-10-CM | POA: Diagnosis not present

## 2024-09-13 ENCOUNTER — Other Ambulatory Visit: Payer: Self-pay | Admitting: Vascular Surgery

## 2024-09-13 DIAGNOSIS — M7989 Other specified soft tissue disorders: Secondary | ICD-10-CM

## 2024-09-21 ENCOUNTER — Other Ambulatory Visit (HOSPITAL_COMMUNITY): Payer: Self-pay | Admitting: Diagnostic Radiology

## 2024-09-21 ENCOUNTER — Other Ambulatory Visit: Payer: Self-pay | Admitting: Hematology and Oncology

## 2024-09-21 ENCOUNTER — Ambulatory Visit
Admission: RE | Admit: 2024-09-21 | Discharge: 2024-09-21 | Disposition: A | Discharge status: Home / Self Care | Source: Ambulatory Visit | Attending: Hematology and Oncology | Admitting: Hematology and Oncology

## 2024-09-21 DIAGNOSIS — Z170 Estrogen receptor positive status [ER+]: Secondary | ICD-10-CM

## 2024-09-21 HISTORY — PX: BREAST BIOPSY: SHX20

## 2024-09-22 LAB — SURGICAL PATHOLOGY

## 2024-09-26 ENCOUNTER — Telehealth: Payer: Self-pay

## 2024-09-26 NOTE — Telephone Encounter (Signed)
 Pt called and LVM with concern about biopsy she had performed last week, stating something went wrong with the machine and they were unsure if she would need to come back for MRI guided bx, she is asking if this is the case. Pt's path came back negative for carcinoma but positive for fat necrosis. Attempted to call pt to offer phone appt with MD to discuss. LVM for call back.

## 2024-09-27 ENCOUNTER — Other Ambulatory Visit: Payer: Self-pay | Admitting: Hematology and Oncology

## 2024-09-27 DIAGNOSIS — R921 Mammographic calcification found on diagnostic imaging of breast: Secondary | ICD-10-CM

## 2024-09-28 ENCOUNTER — Ambulatory Visit
Admission: RE | Admit: 2024-09-28 | Discharge: 2024-09-28 | Disposition: A | Discharge status: Home / Self Care | Source: Ambulatory Visit | Attending: Hematology and Oncology | Admitting: Hematology and Oncology

## 2024-09-28 DIAGNOSIS — R921 Mammographic calcification found on diagnostic imaging of breast: Secondary | ICD-10-CM

## 2024-09-28 HISTORY — PX: BREAST BIOPSY: SHX20

## 2024-09-29 LAB — SURGICAL PATHOLOGY

## 2024-10-25 ENCOUNTER — Ambulatory Visit (HOSPITAL_COMMUNITY)

## 2024-10-25 ENCOUNTER — Ambulatory Visit

## 2024-11-07 ENCOUNTER — Telehealth: Payer: Self-pay

## 2024-11-07 NOTE — Telephone Encounter (Signed)
 Called pt x2 to schedule vein appt. MP stated name, and facility calling from. Pt asked if this was [facility]. MP told pt no, this is [facility], and asked if pt wanted to schedule appt. Pt states, no ma'am I'm being seen somewhere else. Closing referral.

## 2025-02-15 ENCOUNTER — Ambulatory Visit: Admitting: Hematology and Oncology
